# Patient Record
Sex: Female | Born: 1935
Health system: Southern US, Community
[De-identification: ages and names within clinical notes are randomized; demographics above are authoritative.]

## PROBLEM LIST (undated history)

## (undated) DIAGNOSIS — E1165 Type 2 diabetes mellitus with hyperglycemia: Secondary | ICD-10-CM

## (undated) DIAGNOSIS — M353 Polymyalgia rheumatica: Secondary | ICD-10-CM

## (undated) DIAGNOSIS — M159 Polyosteoarthritis, unspecified: Secondary | ICD-10-CM

## (undated) DIAGNOSIS — I10 Essential (primary) hypertension: Secondary | ICD-10-CM

## (undated) DIAGNOSIS — R809 Proteinuria, unspecified: Secondary | ICD-10-CM

## (undated) DIAGNOSIS — F410 Panic disorder [episodic paroxysmal anxiety] without agoraphobia: Secondary | ICD-10-CM

## (undated) DIAGNOSIS — I498 Other specified cardiac arrhythmias: Secondary | ICD-10-CM

## (undated) DIAGNOSIS — E1129 Type 2 diabetes mellitus with other diabetic kidney complication: Secondary | ICD-10-CM

## (undated) DIAGNOSIS — D649 Anemia, unspecified: Secondary | ICD-10-CM

## (undated) DIAGNOSIS — K589 Irritable bowel syndrome without diarrhea: Secondary | ICD-10-CM

## (undated) DIAGNOSIS — E785 Hyperlipidemia, unspecified: Secondary | ICD-10-CM

## (undated) HISTORY — DX: Hyperlipidemia, unspecified: E78.5

## (undated) HISTORY — DX: Type 2 diabetes mellitus with hyperglycemia: E11.65

## (undated) HISTORY — DX: Polyosteoarthritis, unspecified: M15.9

## (undated) HISTORY — DX: Type 2 diabetes mellitus with other diabetic kidney complication: E11.29

## (undated) HISTORY — DX: Essential (primary) hypertension: I10

## (undated) HISTORY — PX: TUBAL LIGATION: SHX77

## (undated) HISTORY — PX: CATARACT EXTRACTION: SUR2

## (undated) HISTORY — DX: Other specified cardiac arrhythmias: I49.8

## (undated) HISTORY — DX: Proteinuria, unspecified: R80.9

## (undated) HISTORY — DX: Polymyalgia rheumatica: M35.3

## (undated) HISTORY — DX: Panic disorder (episodic paroxysmal anxiety): F41.0

## (undated) HISTORY — DX: Irritable bowel syndrome, unspecified: K58.9

---

## 1998-10-11 ENCOUNTER — Emergency Department (HOSPITAL_COMMUNITY): Admission: EM | Admit: 1998-10-11 | Discharge: 1998-10-11 | Payer: Self-pay | Admitting: Emergency Medicine

## 1998-10-12 ENCOUNTER — Encounter: Payer: Self-pay | Admitting: Emergency Medicine

## 1999-07-06 ENCOUNTER — Ambulatory Visit: Admission: RE | Admit: 1999-07-06 | Discharge: 1999-07-06 | Payer: Self-pay | Admitting: Occupational Medicine

## 1999-07-06 ENCOUNTER — Encounter: Payer: Self-pay | Admitting: Occupational Medicine

## 1999-08-14 ENCOUNTER — Other Ambulatory Visit: Admission: RE | Admit: 1999-08-14 | Discharge: 1999-08-14 | Payer: Self-pay | Admitting: Obstetrics & Gynecology

## 1999-09-03 ENCOUNTER — Encounter: Admission: RE | Admit: 1999-09-03 | Discharge: 1999-12-02 | Payer: Self-pay | Admitting: Occupational Medicine

## 1999-12-11 ENCOUNTER — Encounter (INDEPENDENT_AMBULATORY_CARE_PROVIDER_SITE_OTHER): Payer: Self-pay | Admitting: Specialist

## 1999-12-11 ENCOUNTER — Ambulatory Visit (HOSPITAL_COMMUNITY): Admission: RE | Admit: 1999-12-11 | Discharge: 1999-12-11 | Payer: Self-pay | Admitting: Obstetrics and Gynecology

## 2000-07-31 ENCOUNTER — Other Ambulatory Visit: Admission: RE | Admit: 2000-07-31 | Discharge: 2000-07-31 | Payer: Self-pay | Admitting: Obstetrics and Gynecology

## 2000-12-29 ENCOUNTER — Emergency Department (HOSPITAL_COMMUNITY): Admission: EM | Admit: 2000-12-29 | Discharge: 2000-12-30 | Payer: Self-pay | Admitting: Emergency Medicine

## 2000-12-30 ENCOUNTER — Encounter: Payer: Self-pay | Admitting: Emergency Medicine

## 2001-03-06 ENCOUNTER — Encounter: Payer: Self-pay | Admitting: Family Medicine

## 2001-03-06 ENCOUNTER — Encounter: Admission: RE | Admit: 2001-03-06 | Discharge: 2001-03-06 | Payer: Self-pay | Admitting: Family Medicine

## 2001-03-10 ENCOUNTER — Encounter: Admission: RE | Admit: 2001-03-10 | Discharge: 2001-06-08 | Payer: Self-pay | Admitting: Family Medicine

## 2001-11-18 ENCOUNTER — Ambulatory Visit (HOSPITAL_COMMUNITY): Admission: RE | Admit: 2001-11-18 | Discharge: 2001-11-18 | Payer: Self-pay | Admitting: Gastroenterology

## 2003-03-18 ENCOUNTER — Emergency Department (HOSPITAL_COMMUNITY): Admission: EM | Admit: 2003-03-18 | Discharge: 2003-03-18 | Payer: Self-pay | Admitting: Emergency Medicine

## 2005-03-29 ENCOUNTER — Emergency Department (HOSPITAL_COMMUNITY): Admission: EM | Admit: 2005-03-29 | Discharge: 2005-03-29 | Payer: Self-pay | Admitting: Emergency Medicine

## 2005-05-03 ENCOUNTER — Other Ambulatory Visit: Admission: RE | Admit: 2005-05-03 | Discharge: 2005-05-03 | Payer: Self-pay | Admitting: Gynecology

## 2005-06-14 ENCOUNTER — Encounter: Admission: RE | Admit: 2005-06-14 | Discharge: 2005-06-14 | Payer: Self-pay | Admitting: Family Medicine

## 2005-08-11 ENCOUNTER — Emergency Department: Payer: Self-pay | Admitting: Emergency Medicine

## 2007-05-18 ENCOUNTER — Encounter: Admission: RE | Admit: 2007-05-18 | Discharge: 2007-05-18 | Payer: Self-pay | Admitting: Family Medicine

## 2007-08-26 ENCOUNTER — Other Ambulatory Visit: Admission: RE | Admit: 2007-08-26 | Discharge: 2007-08-26 | Payer: Self-pay | Admitting: Gynecology

## 2008-03-10 ENCOUNTER — Ambulatory Visit: Payer: Self-pay | Admitting: Ophthalmology

## 2008-03-10 ENCOUNTER — Other Ambulatory Visit: Payer: Self-pay

## 2008-03-28 ENCOUNTER — Ambulatory Visit: Payer: Self-pay | Admitting: Ophthalmology

## 2010-02-04 ENCOUNTER — Emergency Department: Payer: Self-pay | Admitting: Emergency Medicine

## 2010-05-29 DIAGNOSIS — R809 Proteinuria, unspecified: Secondary | ICD-10-CM

## 2010-05-29 DIAGNOSIS — E1165 Type 2 diabetes mellitus with hyperglycemia: Secondary | ICD-10-CM

## 2010-05-29 DIAGNOSIS — E1129 Type 2 diabetes mellitus with other diabetic kidney complication: Secondary | ICD-10-CM

## 2010-05-29 HISTORY — DX: Type 2 diabetes mellitus with other diabetic kidney complication: E11.29

## 2010-05-29 HISTORY — DX: Type 2 diabetes mellitus with hyperglycemia: E11.65

## 2010-05-29 HISTORY — DX: Proteinuria, unspecified: R80.9

## 2011-01-11 NOTE — Op Note (Signed)
[facility]  Patient:    Kristen Bowman, Kristen Bowman                        MRN: [id number] Proc. Date: 12/11/99 Adm. Date:  [id number] Attending:  Amanda Cockayne                           Operative Report  PREOPERATIVE DIAGNOSIS:       Postmenopausal bleeding.  POSTOPERATIVE DIAGNOSIS:      Postmenopausal bleeding.  Plus endometrial fibroid. Plus atrophic endometrium.  Plus vaginal polyp which has grown.  OPERATION:                    Resection of endometrial fibroid and removal of vaginal polyp.  SURGEON:                      Esmeralda Arthur, M.D.  ASSISTANT:  ANESTHESIA:                   General anesthesia.  PACKS:                        None.  MEDIUM:                       Sorbitol.  Deficit 100 cc.  ESTIMATED BLOOD LOSS:  DESCRIPTION OF PROCEDURE:     The patient was carried to the operating room and  after satisfactory anesthesia, she was placed in the lithotomy position, prepped and draped and the bladder was catheterized.  A weighted speculum was placed in the posterior vagina.  The cervix was grasped  with a tenaculum and the uterus easily sounded to 7 cm.  We then dilated easy to a #21 and then dilated to a #25.  The observation scope was inserted and her right tubal opening was visualized and on the left, she had some blood which I am not  sure we caused or if it is from old bleeding.  Otherwise appeared very atrophic and her posterior floor, she had I think a fibroid.  I was unable to grasp this like a polyp.  We then dilated her to #35 and inserted a resectoscope and resectoscoped the posterior floor.  I sent that as a separate specimen.  I then cauterized the area.  Bleeding was decreased.  We then watched her bleeding for three minutes.  She had no excess bleeding.  I  removed the vaginal polyp which was about 1.5 cm x 1.5 cm.  We then cauterized his with silver nitrate sticks.  We removed the tenaculum on  the cervix.  She had a gush of blood.  We applied a  spongestick to this and it stopped after about 1.5 minutes.  The patient tolerated the procedure well and was carried to the recovery room in good condition. DD:  12/11/99 TD:  12/11/99 Job: [id number]

## 2011-03-25 ENCOUNTER — Encounter: Payer: Self-pay | Admitting: *Deleted

## 2011-03-25 ENCOUNTER — Encounter: Payer: Medicare Other | Attending: Family Medicine | Admitting: *Deleted

## 2011-03-25 DIAGNOSIS — E119 Type 2 diabetes mellitus without complications: Secondary | ICD-10-CM | POA: Insufficient documentation

## 2011-03-25 DIAGNOSIS — Z713 Dietary counseling and surveillance: Secondary | ICD-10-CM | POA: Insufficient documentation

## 2011-03-25 NOTE — Progress Notes (Signed)
  Medical Nutrition Therapy:  Appt start time: 1200 end time:  1300.  Assessment:  Primary concerns today: T2DM w/ Renal Manifestations. Pt here with uncontrolled T2DM and A1c of 8.0% (down from 8.8%).  Reports dietary intake of fried foods, excessive CHO, and increased sodium/fat.  No exercise noted, though pt states she can do some. FBG since 03/11/11 include some hypoglycemia (73, 58, 56, 48, 75 mg/dL), but no sx noted. FBG on 03/25/11 was 146 mg/dL. Reports intake of sweets daily and increased meals away from home.  Pt has renal manifestations, though renal diet not ordered by MD.   MEDICATIONS: Benazepril-HCTZ, Clonazepam, Vytorin, Levemir, Metoprolol, Metformin  DIETARY INTAKE 24-hr recall:  B ( AM): 1/2 banana on "wheat" cereal or cheerios; sometimes oatmeal  Snk ( AM): none or yogurt L ( PM): Corn on cobb, green beans, macaroni & cheese; unsweetened tea Snk ( PM): oatmeal creme cookie or 1/2 candy bar D ( PM): Fried fish, 1/2 baked potato, hush puppies (4 lg); unsweetened tea Snk ( PM): none  Usual physical activity: No exercise noted  Estimated energy needs: 1200 calories 135 g carbohydrates 60 g protein 40-45 g fat  Progress Towards Goal(s):  NEW.   Nutritional Diagnosis: [place]-2.1 Inpaired nutrition utilization related to glucose as evidenced by a recent A1c of 8.0%, eAG of 183 mg/dL, and MD referral for DM education.    Intervention/Goals:  Follow Diabetes Meal Plan as instructed (see yellow card).  Eat 3 meals and 2 snacks, or every 3-5 hrs; Avoid meal skipping.  Limit carbohydrate intake to 30 grams carbohydrate per meal.  Limit carbohydrate intake to 15 grams carbohydrate per snack.  Add lean protein foods to meals and snacks; Limit fried and high carb foods.  Aim for 20-25 g fiber daily.  Monitor glucose levels as instructed by your doctor.  Aim for 20-30 mins of physical activity daily - try walking and/or swimming.  Bring food record and glucose log to your  next nutrition visit.  Monitoring/Evaluation:  Dietary intake, exercise, A1c, and body weight in 4 week(s).

## 2011-03-25 NOTE — Patient Instructions (Addendum)
Goals:  Follow Diabetes Meal Plan as instructed (see yellow card).  Eat 3 meals and 2 snacks, or every 3-5 hrs; Avoid meal skipping.  Limit carbohydrate intake to 30 grams carbohydrate per meal.  Limit carbohydrate intake to 15 grams carbohydrate per snack.  Add lean protein foods to meals and snacks; Limit fried and high carb foods.  Aim for 20-25 g fiber daily.  Monitor glucose levels as instructed by your doctor.  Aim for 20-30 mins of physical activity daily - try walking and/or swimming.  Bring food record and glucose log to your next nutrition visit.

## 2011-03-26 ENCOUNTER — Encounter: Payer: Self-pay | Admitting: *Deleted

## 2011-03-27 ENCOUNTER — Encounter: Payer: Self-pay | Admitting: *Deleted

## 2011-04-23 ENCOUNTER — Ambulatory Visit: Payer: Medicare Other | Admitting: *Deleted

## 2011-04-30 ENCOUNTER — Encounter: Payer: Self-pay | Admitting: *Deleted

## 2011-04-30 ENCOUNTER — Encounter: Payer: Medicare Other | Attending: Family Medicine | Admitting: *Deleted

## 2011-04-30 DIAGNOSIS — E119 Type 2 diabetes mellitus without complications: Secondary | ICD-10-CM | POA: Insufficient documentation

## 2011-04-30 DIAGNOSIS — Z713 Dietary counseling and surveillance: Secondary | ICD-10-CM | POA: Insufficient documentation

## 2011-04-30 NOTE — Progress Notes (Signed)
  Medical Nutrition Therapy:  Appt start time: 1200 end time:  1300.  Assessment:  Primary concerns today: T2DM w/ Renal Manifestations. **NOTE: Wt from last encounter (03/25/11) incorrect - Correct wt was 140 lbs! Not used to eating this much; decreased intake and meal skipping reported since she and boyfriend of 13 years split up last month. Wt loss of 7 lbs since last visit.  Also suspects she is mildly lactose intolerant.  States she is eating sweets daily, but has cut down portions, and eats most meals out at [employer] or [employer]. Has not been counting CHO d/t lack of understanding. Will continue to work on this. FBG ranging from 106-172 mg/dL over last week reported.  MEDICATIONS: No Changes  DIETARY INTAKE:  Has sweets daily after dinner (i.e. Ice cream donuts, candy, 1/2 pc of pie from [employer], etc); eats most meals out.   Usual physical activity:  No exercise noted  Estimated energy needs: 1200 calories 135 g carbohydrates 60 g protein 40-45 g fat  Progress Towards Goal(s):  Minimal progress.   Nutritional Diagnosis: [place]-2.1 Impaired nutrient utilization related to excessive refined CHO intake as evidenced by a recent A1c of 8.0%, eAG of 183 mg/dL, and MD referral for DM education.    Intervention/Goals:  (Continue Previous Goals)  Follow Diabetes Meal Plan as instructed (see yellow card).  Limit meals away from home.  Limit sweets daily.  Aim for 20-30 mins of physical activity daily - Call insurance company to see about locations that offer Silver Sneakers program.  Bring food record and glucose log to your next nutrition visit.  Try looking at www.diabetes.org for recipes and www.calorieking.com for nutrition information for foods.   Contact MD regarding re-check of A1c.   Monitoring/Evaluation:  Dietary intake, exercise, A1c, and body weight in 4-6 week(s).

## 2011-04-30 NOTE — Patient Instructions (Addendum)
Goals:  (Continue Previous Goals)  Follow Diabetes Meal Plan as instructed (see yellow card).  Limit meals away from home.  Limit sweets daily.  Aim for 20-30 mins of physical activity daily - Call insurance company to see about locations that offer Silver Sneakers program.  Bring food record and glucose log to your next nutrition visit.  Try looking at www.diabetes.org for recipes and www.calorieking.com for nutrition information for foods.   Contact MD regarding re-check of A1c.

## 2011-05-23 ENCOUNTER — Encounter: Payer: Medicare Other | Admitting: *Deleted

## 2011-05-23 ENCOUNTER — Encounter: Payer: Self-pay | Admitting: *Deleted

## 2011-05-23 NOTE — Patient Instructions (Addendum)
Goals:  Continue previous nutrition goals (follow yellow card).  Limit meals out.  Choose sodium free options (ex: Her[employer]ox chicken broth).  Aim to start exercise program as soon as possible.

## 2011-05-23 NOTE — Progress Notes (Signed)
  Medical Nutrition Therapy:  Appt start time: 1200 end time:  1300.  Assessment:  Primary concerns today: T2DM w/ Renal Manifestations. Pt states she may look for endocrinologist since the [facility] is so strong and sister died of DM.  Still skipping meals and states lack of appetite sometimes.  Continues to monitor FBG and reports a low of 58 mg/dl this morning; last few weeks ranged from 68-130/150s.  Reports a "funny feeling" in her heart that makes her feel weak.  MD following per pt. Checks feet often.  MEDICATIONS:  No changes  DIETARY INTAKE:  Averages 2 meals/day and 1-2 snacks. Still eats most meals out, but trying to cook more.  - Homemade dumplings  - Decreased sweets; eats crackers instead.  Usual physical activity:  No exercise noted.  Estimated energy needs: 1200 calories 135 g carbohydrates 60 g protein 40-45 g fat  Progress Towards Goal(s):  Some progress; continue.   Nutritional Diagnosis: -2.1 Impaired nutrient utilization related to excessive refined CHO intake as evidenced by a recent A1c of 8.0%, eAG of 183 mg/dL, and MD referral for DM education.    Intervention/Goals:  (Continue Previous Goals)  Continue previous nutrition goals (follow yellow card).  Limit meals out.  Choose sodium free options (ex: Her[employer]ox chicken broth).  Aim to start exercise program as soon as possible.   Monitoring/Evaluation:  Dietary intake, exercise, A1c, BG trends, and body weight in 8 week(s).

## 2011-06-26 ENCOUNTER — Emergency Department (HOSPITAL_COMMUNITY): Payer: Medicare Other

## 2011-06-26 ENCOUNTER — Observation Stay (HOSPITAL_COMMUNITY)
Admission: EM | Admit: 2011-06-26 | Discharge: 2011-06-27 | Disposition: A | Discharge status: Home / Self Care | Payer: Medicare Other | Attending: Internal Medicine | Admitting: Internal Medicine

## 2011-06-26 DIAGNOSIS — F410 Panic disorder [episodic paroxysmal anxiety] without agoraphobia: Secondary | ICD-10-CM | POA: Insufficient documentation

## 2011-06-26 DIAGNOSIS — Y92009 Unspecified place in unspecified non-institutional (private) residence as the place of occurrence of the external cause: Secondary | ICD-10-CM | POA: Insufficient documentation

## 2011-06-26 DIAGNOSIS — E119 Type 2 diabetes mellitus without complications: Secondary | ICD-10-CM | POA: Insufficient documentation

## 2011-06-26 DIAGNOSIS — E785 Hyperlipidemia, unspecified: Secondary | ICD-10-CM | POA: Insufficient documentation

## 2011-06-26 DIAGNOSIS — X58XXXA Exposure to other specified factors, initial encounter: Secondary | ICD-10-CM | POA: Insufficient documentation

## 2011-06-26 DIAGNOSIS — T783XXA Angioneurotic edema, initial encounter: Principal | ICD-10-CM | POA: Insufficient documentation

## 2011-06-26 DIAGNOSIS — R0602 Shortness of breath: Secondary | ICD-10-CM | POA: Insufficient documentation

## 2011-06-26 DIAGNOSIS — I10 Essential (primary) hypertension: Secondary | ICD-10-CM | POA: Insufficient documentation

## 2011-06-26 LAB — CK TOTAL AND CKMB (NOT AT ARMC): Relative Index: INVALID (ref 0.0–2.5)

## 2011-06-26 LAB — CBC
HCT: 32.3 % — ABNORMAL LOW (ref 36.0–46.0)
Hemoglobin: 10.9 g/dL — ABNORMAL LOW (ref 12.0–15.0)
MCV: 85.7 fL (ref 78.0–100.0)
RBC: 3.77 MIL/uL — ABNORMAL LOW (ref 3.87–5.11)
RDW: 15.1 % (ref 11.5–15.5)
WBC: 5.5 10*3/uL (ref 4.0–10.5)

## 2011-06-26 LAB — COMPREHENSIVE METABOLIC PANEL
ALT: 11 U/L (ref 0–35)
AST: 19 U/L (ref 0–37)
Albumin: 3.7 g/dL (ref 3.5–5.2)
Alkaline Phosphatase: 83 U/L (ref 39–117)
Chloride: 100 mEq/L (ref 96–112)
Potassium: 3.2 mEq/L — ABNORMAL LOW (ref 3.5–5.1)
Sodium: 141 mEq/L (ref 135–145)
Total Bilirubin: 0.5 mg/dL (ref 0.3–1.2)

## 2011-06-26 LAB — GLUCOSE, CAPILLARY: Glucose-Capillary: 421 mg/dL — ABNORMAL HIGH (ref 70–99)

## 2011-06-26 LAB — POCT I-STAT TROPONIN I: Troponin i, poc: 0 ng/mL (ref 0.00–0.08)

## 2011-06-26 LAB — DIFFERENTIAL
Eosinophils Relative: 4 % (ref 0–5)
Lymphocytes Relative: 35 % (ref 12–46)
Lymphs Abs: 1.9 10*3/uL (ref 0.7–4.0)

## 2011-06-26 LAB — PRO B NATRIURETIC PEPTIDE: Pro B Natriuretic peptide (BNP): 109.2 pg/mL (ref 0–450)

## 2011-06-26 NOTE — Consult Note (Signed)
NAME:  Kristen Bowman, Kristen Bowman                 ACCOUNT NO.:  [id number]  MEDICAL RECORD NO.:  [id number]  LOCATION:  [facility]                         FACILITY:  [facility]  PHYSICIAN:  Mattias Walmsley H. Pollyann Kennedy, MD     DATE OF BIRTH:  07-29-1931  DATE OF CONSULTATION:  06/26/2011 DATE OF DISCHARGE:                                CONSULTATION   REASON FOR CONSULTATION:  Sore throat and difficulty breathing.  HISTORY:  This is a 77 year old lady, previously in good health.  About 2 days ago, she started developing some laryngitis.  She had no pain or trouble swallowing.  She had no fever.  It progressed until early this morning when she woke up and felt that she was unable to breathe normally.  She had to sit up and was able to breathe a little better. She is still having no trouble swallowing.  She started having some pain in her throat.  She still had no fever.  She was seen in the emergency department and had a lateral x-ray which revealed some possible prevertebral thickening but no evidence of epiglottitis.  She has no fever, stable vital signs and her white blood cell count is within normal limits.  PAST MEDICAL HISTORY:  Significant for diabetes, hypertension, and high cholesterol.  She is on benazepril combination with hydrochlorothiazide and has been on that for several years.  She has been on statins for several years also but about 2 weeks ago had her statin changed to a new one.  She takes 1 shot of insulin a day and is on metformin otherwise.  PAST SURGICAL HISTORY:  Unremarkable.  PHYSICAL EXAMINATION:  She is a healthy-appearing elderly lady.  She is in no distress.  She does have a severe worse and strained voice quality with a very mild biphasic stridor when she breathes forcefully.  When at rest she is breathing quietly.  Oral cavity and pharynx free of any mucosal lesions.  She is edentulous, has upper and lower dentures. Nasal exam clear.  Slight rightward septal deviation.  External  ears normal.  No palpable neck masses.  Flexible fiberoptic laryngoscopy was performed using topical viscous lidocaine for local anesthetic.  There is significant pale boggy edema of the supraglottic mucosa involving the arytenoids, false cords, and a little bit onto the aryepiglottic folds and lateral aspects of the epiglottis.  The cords are moving but slightly restricted from the swelling.  Subglottic airway is not visualized.  No mucosal lesions identified.  The larynx is symmetric.  IMPRESSION:  Angioedema.  This is likely secondary to benazepril, although since she has recently changed her statin, there is also possibility that could be the cause.  I would recommend admission to the hospital by the medical service to adjust her medications appropriately. I would stop the statin and the benazepril immediately.  She has not had any since yesterday in any way.  I would monitor her blood pressure and her blood sugars after having changed her medicines.  I will continue to watch for as far as her airway is concerned and will intervene as needed.  I will recommend intravenous steroids, antihistamines, and H2 blockers for now.  I will  continue to follow.  We will checkup on her again every couple of hours to make sure she does not progress or get any worse.     Jelene Albano H. Pollyann Kennedy, MD     JHR/MEDQ  D:  06/26/2011  T:  06/26/2011  Job:  [id number]  Electronically Signed by Serena Colonel MD on 06/26/2011 05:00:04 PM

## 2011-06-27 LAB — GLUCOSE, CAPILLARY
Glucose-Capillary: 238 mg/dL — ABNORMAL HIGH (ref 70–99)
Glucose-Capillary: 132 mg/dL — ABNORMAL HIGH (ref 70–99)

## 2011-06-28 NOTE — H&P (Signed)
Kristen Bowman, Kristen Bowman                 ACCOUNT NO.:  [id number]  MEDICAL RECORD NO.:  [id number]  LOCATION:  5524                         FACILITY:  [facility]  PHYSICIAN:  Kristen Bowman, M.D.       DATE OF BIRTH:  02-03-1931  DATE OF ADMISSION:  06/26/2011 DATE OF DISCHARGE:                             HISTORY & PHYSICAL   PRIMARY CARE PHYSICIAN:  [name], MD  CHIEF COMPLAINT:  Difficulty breathing.  HISTORY OF PRESENT ILLNESS:  This is a very pleasant 77 year old female, who lives with her son.  She has history of anxiety and panic attacks as well as type 2 diabetes, hypertension, and hyperlipidemia.  She felt as though she had mild laryngitis for 2 days.  No sore throat pain or symptoms, but had difficulty speaking.  This morning, she awoke early with severe difficulty breathing and felt as though she is going to die. The patient is on ACE inhibitor and has recently started on another medication, which I believe is a statin, but she cannot remember the name of it.  Her son brought her to the emergency department.  The ED physician consulted, Dr. Serena Colonel, who is an ENT.  Dr. Pollyann Kennedy examined her with flexible fiberoptic laryngoscopy and found significant pale- boggy edema of the supraglottic mucosa involving the arytenoids false cords and a little bit of the aryepiglottic folds in lateral aspect of the epiglottis.  Cords are moving, but are slightly restricted from the swelling.  He diagnosed her with angioedema likely secondary to benazepril.  The triad hospitalists were called for admission primarily for observation.  PAST MEDICAL HISTORY:  Significant for type 2 diabetes, hypertension, and hyperlipidemia.  She is status post endometrial resection.  She also has history of panic attacks.  CURRENT MEDICATIONS: 1. Klonopin 0.5 mg 1 tablet twice daily. 2. Levemir 12 units daily at bedtime. 3. Metformin 1000 mg p.o. 1 tablet b.i.d. 4. Metoprolol-XL succinate 100 mg p.o. 1.5  tablets daily. 5. Benazepril/hydrochlorothiazide 10/12.5 mg 1 tablet daily. 6. Atorvastatin 40 mg 1 tablet daily. 7. Ezetimibe/simvastatin 10/40 mg 1 tablet p.o. daily.  ALLERGIES:  She previously had no known allergies.  REVIEW OF SYSTEMS:  Essentially negative.  She describes no headache. No fever.  No abdominal pain.  No vomiting.  She has been eating well, feeling in her usual good health recently.  SOCIAL HISTORY:  Negative for tobacco.  Positive for rare alcohol.  She has 1 or 2 strawberry daiquiris a year.  She lives in with her son and is independent with her ADLs.  FAMILY HISTORY:  Significant for father who died with leukemia, her mother who died with liver cancer, and her sister who died as a type 1 diabetic.  PHYSICAL EXAMINATION:  GENERAL:  The patient is alert and oriented, pleasant, speak with her voice does sound slightly worse. VITAL SIGNS:  Temperature 98.0, pulse 94, respirations 18, Bowman pressure 129/55, O2 sat is 94% on 2-1/2 L. HEART:  Regular rate and rhythm.  No obvious murmurs, rubs, or gallops. LUNGS:  Sound clear to auscultation.  No wheezes.  No crackles.  No accessory muscle use. ABDOMEN:  Soft, nontender, and nondistended, good bowel sounds.  EXTREMITIES:  Show no clubbing, cyanosis, or edema. SKIN:  Has no rash, no lesions. NEURO:  Cranial nerves II through XII are grossly intact.  She has no facial asymmetries, no obvious lacunar deficit.  LABORATORY DATA:  Her hemoglobin is 10.9, hematocrit 32.3, white count 5.5, platelets 179.  Sodium 141, potassium 3.2, chloride 100, bicarb 27, BUN 15, creatinine 1.02.  LFTs are within normal limits.  BNP is 109.2. Her group A strep is negative.  Radiological exams included chest x-ray that showed emphysematous changes.  Also, she has an x-ray of her neck that shows prevertebral soft tissue swelling causing some narrowing of the cervical tracheal air column.  ASSESSMENT/PLAN: 1. Difficulty breathing, likely  secondary to angioedema. 2. Angioedema, likely secondary to ACE inhibitor. 3. Type 2 diabetes.  The patient will be placed on NovoLog and Lantus. 4. Hypertension.  We will give her metoprolol with old parameters;     however, we will hold her ACE inhibitor. 5. Hyperlipidemia.  We will hold her statins for now. 6. Panic attacks.  She will be maintained on her p.o. Klonopin. 7. Mild hypokalemia.  We will replete in her IV fluids.  We would     suggest to monitor this patient closely for period of 12 to 72     hours until the resolution of her angioedema.     Fermin Schwab, MD   ______________________________ Kristen Bowman, M.D.    TY/MEDQ  D:  06/26/2011  T:  06/26/2011  Job:  [id number]  cc:   [name], M.D.  Electronically Signed by Kristen Bowman M.D. on 06/28/2011 04:53:11 PM

## 2012-09-24 ENCOUNTER — Inpatient Hospital Stay (HOSPITAL_COMMUNITY)
Admission: EM | Admit: 2012-09-24 | Discharge: 2012-09-26 | DRG: 310 | Disposition: A | Discharge status: Home / Self Care | Payer: Medicare Other | Attending: Internal Medicine | Admitting: Internal Medicine

## 2012-09-24 ENCOUNTER — Emergency Department (HOSPITAL_COMMUNITY): Payer: Medicare Other

## 2012-09-24 ENCOUNTER — Encounter (HOSPITAL_COMMUNITY): Payer: Self-pay | Admitting: Internal Medicine

## 2012-09-24 DIAGNOSIS — R55 Syncope and collapse: Secondary | ICD-10-CM

## 2012-09-24 DIAGNOSIS — Z794 Long term (current) use of insulin: Secondary | ICD-10-CM

## 2012-09-24 DIAGNOSIS — K589 Irritable bowel syndrome without diarrhea: Secondary | ICD-10-CM | POA: Diagnosis present

## 2012-09-24 DIAGNOSIS — N182 Chronic kidney disease, stage 2 (mild): Secondary | ICD-10-CM

## 2012-09-24 DIAGNOSIS — D649 Anemia, unspecified: Secondary | ICD-10-CM

## 2012-09-24 DIAGNOSIS — I498 Other specified cardiac arrhythmias: Principal | ICD-10-CM | POA: Diagnosis present

## 2012-09-24 DIAGNOSIS — Z833 Family history of diabetes mellitus: Secondary | ICD-10-CM

## 2012-09-24 DIAGNOSIS — F410 Panic disorder [episodic paroxysmal anxiety] without agoraphobia: Secondary | ICD-10-CM

## 2012-09-24 DIAGNOSIS — D509 Iron deficiency anemia, unspecified: Secondary | ICD-10-CM

## 2012-09-24 DIAGNOSIS — Z79899 Other long term (current) drug therapy: Secondary | ICD-10-CM

## 2012-09-24 DIAGNOSIS — E785 Hyperlipidemia, unspecified: Secondary | ICD-10-CM

## 2012-09-24 DIAGNOSIS — R002 Palpitations: Secondary | ICD-10-CM

## 2012-09-24 DIAGNOSIS — M353 Polymyalgia rheumatica: Secondary | ICD-10-CM | POA: Diagnosis present

## 2012-09-24 DIAGNOSIS — E1129 Type 2 diabetes mellitus with other diabetic kidney complication: Secondary | ICD-10-CM

## 2012-09-24 DIAGNOSIS — E1165 Type 2 diabetes mellitus with hyperglycemia: Secondary | ICD-10-CM | POA: Diagnosis present

## 2012-09-24 DIAGNOSIS — I129 Hypertensive chronic kidney disease with stage 1 through stage 4 chronic kidney disease, or unspecified chronic kidney disease: Secondary | ICD-10-CM | POA: Diagnosis present

## 2012-09-24 DIAGNOSIS — I10 Essential (primary) hypertension: Secondary | ICD-10-CM

## 2012-09-24 DIAGNOSIS — R000 Tachycardia, unspecified: Secondary | ICD-10-CM

## 2012-09-24 LAB — POCT I-STAT 3, VENOUS BLOOD GAS (G3P V)
Bicarbonate: 24.8 mEq/L — ABNORMAL HIGH (ref 20.0–24.0)
O2 Saturation: 70 %
TCO2: 26 mmol/L (ref 0–100)

## 2012-09-24 LAB — COMPREHENSIVE METABOLIC PANEL
BUN: 22 mg/dL (ref 6–23)
Calcium: 9.7 mg/dL (ref 8.4–10.5)
Creatinine, Ser: 1.03 mg/dL (ref 0.50–1.10)
GFR calc Af Amer: 58 mL/min — ABNORMAL LOW (ref >90)
GFR calc non Af Amer: 50 mL/min — ABNORMAL LOW (ref >90)
Glucose, Bld: 294 mg/dL — ABNORMAL HIGH (ref 70–99)
Sodium: 141 mEq/L (ref 135–145)
Total Protein: 7.4 g/dL (ref 6.0–8.3)

## 2012-09-24 LAB — CBC WITH DIFFERENTIAL/PLATELET
Eosinophils Absolute: 0.1 10*3/uL (ref 0.0–0.7)
Eosinophils Relative: 2 % (ref 0–5)
Hemoglobin: 11.3 g/dL — ABNORMAL LOW (ref 12.0–15.0)
Lymphs Abs: 2.5 10*3/uL (ref 0.7–4.0)
MCHC: 32.4 g/dL (ref 30.0–36.0)
Monocytes Absolute: 0.3 10*3/uL (ref 0.1–1.0)
Monocytes Relative: 6 % (ref 3–12)
RDW: 15.5 % (ref 11.5–15.5)
WBC: 4.8 10*3/uL (ref 4.0–10.5)

## 2012-09-24 LAB — URINALYSIS, ROUTINE W REFLEX MICROSCOPIC
Nitrite: NEGATIVE
Specific Gravity, Urine: 1.009 (ref 1.005–1.030)
Urobilinogen, UA: 0.2 mg/dL (ref 0.0–1.0)
pH: 5.5 (ref 5.0–8.0)

## 2012-09-24 LAB — KETONES, QUALITATIVE: Acetone, Bld: NEGATIVE

## 2012-09-24 LAB — GLUCOSE, CAPILLARY: Glucose-Capillary: 274 mg/dL — ABNORMAL HIGH (ref 70–99)

## 2012-09-24 LAB — TROPONIN I: Troponin I: <0.3 ng/mL (ref <0.30)

## 2012-09-24 LAB — URINE MICROSCOPIC-ADD ON

## 2012-09-24 MED ORDER — DILTIAZEM HCL 50 MG/10ML IV SOLN
10.0000 mg | Freq: Once | INTRAVENOUS | Status: DC
  Filled 2012-09-24: qty 2

## 2012-09-24 MED ORDER — SODIUM CHLORIDE 0.9 % IV BOLUS (SEPSIS)
1000.0000 mL | Freq: Once | INTRAVENOUS | Status: AC
  Administered 2012-09-24: 1000 mL via INTRAVENOUS

## 2012-09-24 MED ORDER — SODIUM CHLORIDE 0.9 % IV BOLUS (SEPSIS): 1000.0000 mL | Freq: Once | INTRAVENOUS | Status: DC

## 2012-09-24 MED ORDER — SODIUM CHLORIDE 0.9 % IV SOLN
Freq: Once | INTRAVENOUS | Status: AC
  Administered 2012-09-24: 100 mL via INTRAVENOUS

## 2012-09-24 MED ORDER — DILTIAZEM HCL 100 MG IV SOLR
5.0000 mg/h | INTRAVENOUS | Status: DC
  Administered 2012-09-24: 5 mg/h via INTRAVENOUS
  Filled 2012-09-24: qty 100

## 2012-09-24 NOTE — ED Provider Notes (Signed)
History  This chart was scribed for Glynn Octave, MD by Shari Heritage, ED Scribe. The patient was seen in room TRAAC/TRAAC. Patient's care was started at 1918.   CSN: [id number]  Arrival date & time 09/24/12  1918   Chief Complaint  Patient presents with  . Hyperglycemia  . Tachycardia  . Hypertension    The history is provided by the patient. No language interpreter was used.    HPI Comments: Kristen Bowman is a 79 y.o. female who presents to the Emergency Department stating 1.5 hours ago, she started to have palpitations and became flushed in the face. Patient says that while she was taking her blood sugar at home, her heart started to beat very fast. Patient claims that her heart rate gradually increased for 1 hour prior to the sudden increase. Patient's blood sugar was 428. Patient denies any chest pain, shortness of breath, fever, cough, abdominal pain or vomiting. Patient states that she has experienced similar symptoms before a few years ago. She was brought to the ED by her neighbor. Patient does not have a cardiologist. Medical history includes hypertension, diabetes, hyperlipidemia and mixed cardia dysrhythmias. Patient denies history of kidney problems.  PCP - Cam Hai   Past Medical History  Diagnosis Date  . Hypertension   . Diabetes mellitus     T2DM with renal manifestations  . Panic disorder   . Polymyalgia rheumatica   . Hematuria   . Type II or unspecified type diabetes mellitus with renal manifestations, uncontrolled(250.42) 05/29/10    CKD II (mild)  . Proteinuria 05/29/10  . Generalized osteoarthrosis, unspecified site   . Other specified cardiac dysrhythmias   . Hyperlipidemia     mixed  . Irritable bowel syndrome   . Anxiety states     History reviewed. No pertinent past surgical history.  Family History  Problem Relation Age of Onset  . Diabetes Sister   . Cancer Other   . Heart attack Other     History  Substance Use Topics  . Smoking  status: Never Smoker   . Smokeless tobacco: Never Used  . Alcohol Use: No    OB History    Grav Para Term Preterm Abortions TAB SAB Ect Mult Living                  Review of Systems A complete 10 system review of systems was obtained and all systems are negative except as noted in the HPI and PMH.   Allergies  Review of patient's allergies indicates no known allergies.  Home Medications   Current Outpatient Rx  Name  Route  Sig  Dispense  Refill  . AMLODIPINE BESYLATE 10 MG PO TABS   Oral   Take 10 mg by mouth daily.         Marland Kitchen CLONAZEPAM 0.5 MG PO TABS   Oral   Take 0.5 mg by mouth 2 (two) times daily. Takes 1/2 tab in a.m. and 1 tab at hs         . INSULIN ASPART PROT & ASPART (70-30) 100 UNIT/ML [place] SUSP   Subcutaneous   Inject 12-14 Units into the skin 2 (two) times daily with a meal. 12 units in a.m. And 14 units in pm         . METFORMIN HCL 1000 MG PO TABS   Oral   Take 1,000 mg by mouth 2 (two) times daily with a meal.           .  METOPROLOL SUCCINATE ER 100 MG PO TB24   Oral   Take 150 mg by mouth daily.          Marland Kitchen SIMVASTATIN 20 MG PO TABS   Oral   Take 20 mg by mouth every evening.           Triage Vitals: BP 185/79  Pulse 144  Temp 98.4 F (36.9 C) (Oral)  Resp 14  SpO2 98%  Physical Exam  Constitutional: She is oriented to person, place, and time. She appears well-developed and well-nourished.       Elderly female sitting up in bed. No acute distress. Speaking in full sentences.  HENT:  Head: Normocephalic and atraumatic.  Eyes: Conjunctivae normal and EOM are normal. Pupils are equal, round, and reactive to light.  Neck: Neck supple.  Cardiovascular: Regular rhythm, normal heart sounds and intact distal pulses.  Tachycardia present.   No murmur heard. Pulmonary/Chest: Effort normal and breath sounds normal. No respiratory distress. She has no wheezes. She has no rales.  Abdominal: Soft. There is no tenderness.  Neurological:  She is alert and oriented to person, place, and time.  Skin: Skin is warm.  Psychiatric: She has a normal mood and affect. Her behavior is normal.    ED Course  Procedures (including critical care time) DIAGNOSTIC STUDIES: Oxygen Saturation is 98% on room air, normal by my interpretation.    COORDINATION OF CARE: 7:54 PM- Patient informed of current plan for treatment and evaluation and agrees with plan at this time.   8:08 PM - Patient moved to D36.  8:26 PM- Heart rate is sinus. Patient states she feels that HR is slowing.     Labs Reviewed  CBC WITH DIFFERENTIAL - Abnormal; Notable for the following:    Hemoglobin 11.3 (*)     HCT 34.9 (*)     Neutrophils Relative 39 (*)     Lymphocytes Relative 52 (*)     All other components within normal limits  COMPREHENSIVE METABOLIC PANEL - Abnormal; Notable for the following:    Glucose, Bld 294 (*)     GFR calc non Af Amer 50 (*)     GFR calc Af Amer 58 (*)     All other components within normal limits  URINALYSIS, ROUTINE W REFLEX MICROSCOPIC - Abnormal; Notable for the following:    APPearance CLOUDY (*)     Glucose, UA 250 (*)     Leukocytes, UA TRACE (*)     All other components within normal limits  GLUCOSE, CAPILLARY - Abnormal; Notable for the following:    Glucose-Capillary 274 (*)     All other components within normal limits  POCT I-STAT 3, BLOOD GAS (G3P V) - Abnormal; Notable for the following:    pH, Ven 7.371 (*)     pCO2, Ven 42.7 (*)     Bicarbonate 24.8 (*)     All other components within normal limits  URINE MICROSCOPIC-ADD ON - Abnormal; Notable for the following:    Squamous Epithelial / LPF FEW (*)     All other components within normal limits  TROPONIN I  KETONES, QUALITATIVE  TROPONIN I  TROPONIN I  TSH  T4, FREE   Dg Chest Portable 1 View  09/24/2012  *RADIOLOGY REPORT*  Clinical Data: Hyperglycemia.  Tachycardia.  Diabetic hypertensive patient.  PORTABLE CHEST - 1 VIEW  Comparison:  06/26/2011.  Findings: Calcified slightly tortuous aorta.  Heart size within normal limits.  No infiltrate, congestive heart failure  or pneumothorax.  The patient would eventually benefit from follow-up two-view chest with cardiac leads removed.  IMPRESSION: Calcified mildly tortuous aorta.  No infiltrate or congestive heart failure.  Please see above.   Original Report Authenticated By: Lacy Duverney, M.D.      1. Tachycardia   2. Palpitations   3. Near syncope   4. Anemia   5. CKD (chronic kidney disease), stage II   6. Type II or unspecified type diabetes mellitus with renal manifestations, uncontrolled(250.42)      Date: 09/24/2012  Rate: 135  Rhythm: concern for possible A. flutter  QRS Axis: normal  Intervals: normal  ST/T Wave abnormalities: normal  Conduction Disutrbances:none  Narrative Interpretation:  p waves present. Doubt SVT. p waves with same morphology and regular rate. Doubt a. Fib. Possible a. Flutter.  Old EKG Reviewed: changes noted    MDM   79 y/o F p/w tachycardia. Pre-syncopal sensation. Uncertain source. A. Flutter versus sinus tachycardia. Started on dilt ggt after bolus with some improvement. 1L NS bolus.  Troponin negative. No chest pain. Palpitations present. CXR largely unremarkable. Uncertain source of tachycardia. Acute onset. Doubt sepsis. Medicine consulted for further w/u and mgmt.  Labs and imaging reviewed by myself and considered in medical decision making if ordered. Imaging interpreted by radiology.   Discussed case with Dr. Manus Gunning who is in agreement with assessment and plan.        I personally performed the services described in this documentation, which was scribed in my presence. The recorded information has been reviewed and is accurate.     Stevie Kern, MD 09/25/12 [phone]

## 2012-09-24 NOTE — ED Notes (Signed)
Iv nss increased to 535mlo/hr

## 2012-09-24 NOTE — ED Notes (Signed)
Blood sugar high 428. Heart beating very fast, then face turned red.

## 2012-09-24 NOTE — H&P (Signed)
Patient's PCP: [name], MD  Chief Complaint: Palpitations  History of Present Illness: Kristen Bowman is a 79 y.o. Caucasian female with history of hypertension, diabetes mellitus type II with renal complications, panic disorder, polymyalgia rheumatica, chronic kidney disease stage II, hyperlipidemia, and irritable bowel syndrome who presents with the above complaints.  Patient reports that around 6 p.m. today she felt palpitations and she noted her heart beating fast.  She presented to the emergency department where she was found to have a heart rate of 144, she was started on a diltiazem drip.  Orthostatic vitals were checked in the emergency department, blood pressure was unchanged however patient tachycardic on standing.  The hospitalist service was asked to admit the patient for further workup.  Patient denies any recent fevers, chills, nausea, vomiting, chest pain, shortness of breath, abdominal pain, diarrhea, dizziness, headaches or vision changes.  She denies any panic attacks.  She denies any changes in her medications.  She denies using caffeine recently, however has had a chocolate donut this morning and this evening.   Review of Systems: All systems reviewed with the patient and positive as per history of present illness, otherwise all other systems are negative.  Past Medical History  Diagnosis Date  . Hypertension   . Diabetes mellitus     T2DM with renal manifestations  . Panic disorder   . Polymyalgia rheumatica   . Hematuria   . Type II or unspecified type diabetes mellitus with renal manifestations, uncontrolled(250.42) 05/29/10    CKD II (mild)  . Proteinuria 05/29/10  . Generalized osteoarthrosis, unspecified site   . Other specified cardiac dysrhythmias   . Hyperlipidemia     mixed  . Irritable bowel syndrome   . Anxiety states    History reviewed. No pertinent past surgical history. Family History  Problem Relation Age of Onset  . Diabetes Sister   . Cancer  Other   . Heart attack Other    History   Social History  . Marital Status: Divorced    Spouse Name: N/A    Number of Children: N/A  . Years of Education: N/A   Occupational History  . Not on file.   Social History Main Topics  . Smoking status: Never Smoker   . Smokeless tobacco: Never Used  . Alcohol Use: No  . Drug Use: No  . Sexually Active: Not on file   Other Topics Concern  . Not on file   Social History Narrative  . No narrative on file   Allergies: Review of patient's allergies indicates no known allergies.  Home Meds: Prior to Admission medications   Medication Sig Start Date End Date Taking? Authorizing Provider  amLODipine (NORVASC) 10 MG tablet Take 10 mg by mouth daily.   Yes Historical Provider, MD  clonazePAM (KLONOPIN) 0.5 MG tablet Take 0.5 mg by mouth 2 (two) times daily. Takes 1/2 tab in a.m. and 1 tab at hs   Yes Historical Provider, MD  insulin aspart protamine-insulin aspart (NOVOLOG 70/30) (70-30) 100 UNIT/ML injection Inject 12-14 Units into the skin 2 (two) times daily with a meal. 12 units in a.m. And 14 units in pm   Yes Historical Provider, MD  metFORMIN (GLUCOPHAGE) 1000 MG tablet Take 1,000 mg by mouth 2 (two) times daily with a meal.     Yes Historical Provider, MD  metoprolol (TOPROL-XL) 100 MG 24 hr tablet Take 150 mg by mouth daily.    Yes Historical Provider, MD  simvastatin (ZOCOR) 20 MG tablet  Take 20 mg by mouth every evening.   Yes Historical Provider, MD    Physical Exam: Blood pressure 157/57, pulse 108, temperature 98.4 F (36.9 C), temperature source Oral, resp. rate 20, SpO2 98.00%. General: Awake, Oriented x3, No acute distress. HEENT: EOMI, Moist mucous membranes Neck: Supple CV: S1 and S2, tachycardic. Lungs: Clear to ascultation bilaterally Abdomen: Soft, Nontender, Nondistended, +bowel sounds. Ext: Good pulses. Trace edema. No clubbing or cyanosis noted. Neuro: Cranial Nerves II-XII grossly intact. Has 5/5 motor  strength in upper and lower extremities. Skin: Slightly flushed, no discernible rashes.  Lab results:  [facility] 09/24/12 1950  NA 141  K 3.5  CL 103  CO2 24  GLUCOSE 294*  BUN 22  CREATININE 1.03  CALCIUM 9.7  MG --  PHOS --    Basename 09/24/12 1950  AST 15  ALT 9  ALKPHOS 108  BILITOT 0.4  PROT 7.4  ALBUMIN 3.8   No results found for this basename: LIPASE:2,AMYLASE:2 in the last 72 hours  Basename 09/24/12 1950  WBC 4.8  NEUTROABS 1.9  HGB 11.3*  HCT 34.9*  MCV 82.1  PLT 189    Basename 09/24/12 1955  CKTOTAL --  CKMB --  CKMBINDEX --  TROPONINI <0.30   No components found with this basename: POCBNP:3 No results found for this basename: DDIMER in the last 72 hours No results found for this basename: HGBA1C:2 in the last 72 hours No results found for this basename: CHOL:2,HDL:2,LDLCALC:2,TRIG:2,CHOLHDL:2,LDLDIRECT:2 in the last 72 hours No results found for this basename: TSH,T4TOTAL,FREET3,T3FREE,THYROIDAB in the last 72 hours No results found for this basename: VITAMINB12:2,FOLATE:2,FERRITIN:2,TIBC:2,IRON:2,RETICCTPCT:2 in the last 72 hours Imaging results:  Dg Chest Portable 1 View  09/24/2012  *RADIOLOGY REPORT*  Clinical Data: Hyperglycemia.  Tachycardia.  Diabetic hypertensive patient.  PORTABLE CHEST - 1 VIEW  Comparison: 06/26/2011.  Findings: Calcified slightly tortuous aorta.  Heart size within normal limits.  No infiltrate, congestive heart failure or pneumothorax.  The patient would eventually benefit from follow-up two-view chest with cardiac leads removed.  IMPRESSION: Calcified mildly tortuous aorta.  No infiltrate or congestive heart failure.  Please see above.   Original Report Authenticated By: Lacy Duverney, M.D.    Other results: EKG: Sinus tachycardia with heart rate of 135.  Assessment & Plan by Problem: Palpitations/sinus tachycardia Etiology unclear.  Patient started on diltiazem drip, titrate for heart rate less than 110, hold if  systolic blood pressure less than 100 or if heart rate less than 60.  Broad differential.  Patient was not orthostatic on admission, but did complain of dry mouth will hydrate the patient on IV fluids.  Check TSH and free T4 for hyperthyroidism.  Will get a 2-D echocardiogram for evaluation.  Uncertain if stimulant (chocolate consumption) resulted in tachycardia.  Patient has a habit of taking metoprolol in the evening, uncertain if patient has reflex tachycardia from not taking metoprolol.  Patient may benefit from a twice a day dosing of metoprolol.  Continue diltiazem drip for now, start metoprolol in the morning, consider weaning off diltiazem drip in the morning.  Patient has a history of unspecified cardiac arrhythmia of unclear etiology in the past, do not have any records available for review, patient reports that she was she in the hospital a few years ago with similar complaints, and was told workup was negative.  Hypertension Improved with diltiazem drip.  Continue to monitor.  Hold amlodipine.  Anxiety/history of panic disorder Continue home benzodiazepine.  Stable.  Type 2 diabetes uncontrolled with  renal manifestation Continue NovoLog 70/30.  Sliding scale insulin.  Check hemoglobin A1c in the morning.  Hold metformin.  Mild Anemia Check anemia panel in the morning.  Chronic kidney disease stage II Renal function at baseline.  Continue to monitor.  Hyperlipidemia Continue statin.  CODE STATUS Full code.  This was discussed with the patient at the time of admission.  Disposition Admit the patient to step down as diltiazem has been titrated as inpatient.  Time spent on admission, talking to the patient, and coordinating care was: 60 mins.  Amora Sheehy A, MD 09/24/2012, 11:08 PM

## 2012-09-24 NOTE — ED Notes (Signed)
EKG given to Dr. Manus Gunning, copy placed in pt chart.

## 2012-09-24 NOTE — ED Notes (Signed)
Admitting doctor here to see 

## 2012-09-24 NOTE — ED Notes (Signed)
1000cc nss infused.  1000cc nss added to run in 135ml/hr

## 2012-09-24 NOTE — ED Notes (Signed)
The pts pulse is 122 .  cardizem dri[p increased to 28ml/hr.  bp good

## 2012-09-24 NOTE — ED Notes (Signed)
The pt is alert at present skin warm and dry.  No pain anywhere.  When she was home and her  Heart started beating fast she  Had no chest pain no sob.  She has had af in the past on no meds  For that.  She also has panic attacks her family is asking are the symptoms the same or similar. Sinus tach on the monitor

## 2012-09-25 ENCOUNTER — Encounter (HOSPITAL_COMMUNITY): Payer: Self-pay | Admitting: *Deleted

## 2012-09-25 DIAGNOSIS — R002 Palpitations: Secondary | ICD-10-CM

## 2012-09-25 DIAGNOSIS — D509 Iron deficiency anemia, unspecified: Secondary | ICD-10-CM

## 2012-09-25 LAB — BASIC METABOLIC PANEL
BUN: 16 mg/dL (ref 6–23)
CO2: 26 mEq/L (ref 19–32)
Chloride: 108 mEq/L (ref 96–112)
Glucose, Bld: 136 mg/dL — ABNORMAL HIGH (ref 70–99)
Potassium: 3.3 mEq/L — ABNORMAL LOW (ref 3.5–5.1)

## 2012-09-25 LAB — CBC
HCT: 30.1 % — ABNORMAL LOW (ref 36.0–46.0)
Hemoglobin: 9.8 g/dL — ABNORMAL LOW (ref 12.0–15.0)
MCH: 26.6 pg (ref 26.0–34.0)
MCHC: 32.6 g/dL (ref 30.0–36.0)
MCV: 81.8 fL (ref 78.0–100.0)

## 2012-09-25 LAB — RETICULOCYTES
RBC.: 3.68 MIL/uL — ABNORMAL LOW (ref 3.87–5.11)
Retic Count, Absolute: 51.5 10*3/uL (ref 19.0–186.0)

## 2012-09-25 LAB — TSH: TSH: 2.554 u[IU]/mL (ref 0.350–4.500)

## 2012-09-25 LAB — HEMOGLOBIN A1C: Mean Plasma Glucose: 154 mg/dL — ABNORMAL HIGH (ref <117)

## 2012-09-25 LAB — VITAMIN B12: Vitamin B-12: 183 pg/mL — ABNORMAL LOW (ref 211–911)

## 2012-09-25 LAB — GLUCOSE, CAPILLARY
Glucose-Capillary: 293 mg/dL — ABNORMAL HIGH (ref 70–99)
Glucose-Capillary: 113 mg/dL — ABNORMAL HIGH (ref 70–99)

## 2012-09-25 LAB — FOLATE: Folate: >20 ng/mL

## 2012-09-25 MED ORDER — DILTIAZEM HCL 100 MG IV SOLR
5.0000 mg/h | INTRAVENOUS | Status: DC
  Administered 2012-09-25 (×2): 10 mg/h via INTRAVENOUS
  Filled 2012-09-25: qty 100

## 2012-09-25 MED ORDER — SIMVASTATIN 20 MG PO TABS
20.0000 mg | ORAL_TABLET | Freq: Every evening | ORAL | Status: DC
  Administered 2012-09-25: 20 mg via ORAL
  Filled 2012-09-25 (×2): qty 1

## 2012-09-25 MED ORDER — INSULIN ASPART PROT & ASPART (70-30 MIX) 100 UNIT/ML ~~LOC~~ SUSP
14.0000 [IU] | Freq: Every day | SUBCUTANEOUS | Status: DC
  Administered 2012-09-26: 14 [IU] via SUBCUTANEOUS
  Filled 2012-09-25: qty 10

## 2012-09-25 MED ORDER — SODIUM CHLORIDE 0.9 % IV SOLN
25.0000 mg | Freq: Once | INTRAVENOUS | Status: AC
  Administered 2012-09-25 (×2): 25 mg via INTRAVENOUS
  Filled 2012-09-25: qty 2

## 2012-09-25 MED ORDER — CLONAZEPAM 0.5 MG PO TABS
0.5000 mg | ORAL_TABLET | Freq: Two times a day (BID) | ORAL | Status: DC
  Administered 2012-09-25 – 2012-09-26 (×4): 0.5 mg via ORAL
  Filled 2012-09-25 (×4): qty 1

## 2012-09-25 MED ORDER — POTASSIUM CHLORIDE CRYS ER 20 MEQ PO TBCR
40.0000 meq | EXTENDED_RELEASE_TABLET | ORAL | Status: AC
  Administered 2012-09-25 (×2): 40 meq via ORAL
  Filled 2012-09-25 (×2): qty 2

## 2012-09-25 MED ORDER — INSULIN ASPART PROT & ASPART (70-30 MIX) 100 UNIT/ML ~~LOC~~ SUSP
12.0000 [IU] | Freq: Every day | SUBCUTANEOUS | Status: DC
  Administered 2012-09-25: 12 [IU] via SUBCUTANEOUS
  Filled 2012-09-25: qty 10

## 2012-09-25 MED ORDER — ACETAMINOPHEN 325 MG PO TABS: 650.0000 mg | ORAL_TABLET | Freq: Four times a day (QID) | ORAL | Status: DC | PRN

## 2012-09-25 MED ORDER — INSULIN ASPART PROT & ASPART (70-30 MIX) 100 UNIT/ML ~~LOC~~ SUSP
16.0000 [IU] | Freq: Every day | SUBCUTANEOUS | Status: DC
  Administered 2012-09-25: 16 [IU] via SUBCUTANEOUS
  Filled 2012-09-25: qty 10

## 2012-09-25 MED ORDER — ACETAMINOPHEN 650 MG RE SUPP: 650.0000 mg | Freq: Four times a day (QID) | RECTAL | Status: DC | PRN

## 2012-09-25 MED ORDER — ONDANSETRON HCL 4 MG PO TABS: 4.0000 mg | ORAL_TABLET | Freq: Four times a day (QID) | ORAL | Status: DC | PRN

## 2012-09-25 MED ORDER — METOPROLOL SUCCINATE ER 50 MG PO TB24
150.0000 mg | ORAL_TABLET | Freq: Every day | ORAL | Status: DC
  Administered 2012-09-25 – 2012-09-26 (×2): 150 mg via ORAL
  Filled 2012-09-25 (×2): qty 1

## 2012-09-25 MED ORDER — FERROUS GLUCONATE 324 (38 FE) MG PO TABS
324.0000 mg | ORAL_TABLET | Freq: Three times a day (TID) | ORAL | Status: DC
  Administered 2012-09-25 – 2012-09-26 (×3): 324 mg via ORAL
  Filled 2012-09-25 (×6): qty 1

## 2012-09-25 MED ORDER — ENOXAPARIN SODIUM 40 MG/0.4ML ~~LOC~~ SOLN
40.0000 mg | SUBCUTANEOUS | Status: DC
  Administered 2012-09-25: 40 mg via SUBCUTANEOUS
  Filled 2012-09-25 (×2): qty 0.4

## 2012-09-25 MED ORDER — SODIUM CHLORIDE 0.9 % IV SOLN
125.0000 mg | Freq: Once | INTRAVENOUS | Status: AC
  Administered 2012-09-25: 125 mg via INTRAVENOUS

## 2012-09-25 MED ORDER — INSULIN ASPART PROT & ASPART (70-30 MIX) 100 UNIT/ML ~~LOC~~ SUSP: 12.0000 [IU] | Freq: Two times a day (BID) | SUBCUTANEOUS | Status: DC

## 2012-09-25 MED ORDER — INSULIN ASPART PROT & ASPART (70-30 MIX) 100 UNIT/ML ~~LOC~~ SUSP: 14.0000 [IU] | Freq: Every day | SUBCUTANEOUS | Status: DC

## 2012-09-25 MED ORDER — INSULIN ASPART 100 UNIT/ML ~~LOC~~ SOLN
0.0000 [IU] | Freq: Three times a day (TID) | SUBCUTANEOUS | Status: DC
  Administered 2012-09-25: 5 [IU] via SUBCUTANEOUS

## 2012-09-25 MED ORDER — SODIUM CHLORIDE 0.9 % IV SOLN
INTRAVENOUS | Status: DC
  Administered 2012-09-25: 100 mL/h via INTRAVENOUS

## 2012-09-25 MED ORDER — INSULIN ASPART 100 UNIT/ML ~~LOC~~ SOLN: 0.0000 [IU] | Freq: Every day | SUBCUTANEOUS | Status: DC

## 2012-09-25 MED ORDER — INSULIN ASPART 100 UNIT/ML ~~LOC~~ SOLN
0.0000 [IU] | Freq: Every day | SUBCUTANEOUS | Status: DC
  Administered 2012-09-25: 3 [IU] via SUBCUTANEOUS

## 2012-09-25 MED ORDER — ONDANSETRON HCL 4 MG/2ML IJ SOLN: 4.0000 mg | Freq: Four times a day (QID) | INTRAMUSCULAR | Status: DC | PRN

## 2012-09-25 NOTE — ED Provider Notes (Signed)
I saw and evaluated the patient, reviewed the resident's note and I agree with the findings and plan.  Acute onset of palpitations, flushing, without chest pain or SOB. No distress. Very dry mucus membranes.   Hypertensive with apparent regular narrow complex tachycardia. P waves evident. Sinus versus atrial flutter. Improvement with cardizem gtt and fluids.  Troponin negative. Unclear etiology of tachycardia. No evidence of infection. Check tsh.  CRITICAL CARE Performed by: Glynn Octave   Total critical care time: 30  Critical care time was exclusive of separately billable procedures and treating other patients.  Critical care was necessary to treat or prevent imminent or life-threatening deterioration.  Critical care was time spent personally by me on the following activities: development of treatment plan with patient and/or surrogate as well as nursing, discussions with consultants, evaluation of patient's response to treatment, examination of patient, obtaining history from patient or surrogate, ordering and performing treatments and interventions, ordering and review of laboratory studies, ordering and review of radiographic studies, pulse oximetry and re-evaluation of patient's condition.   Glynn Octave, MD 09/25/12 [phone]

## 2012-09-25 NOTE — Progress Notes (Signed)
  Echocardiogram 2D Echocardiogram has been performed.  [place] 09/25/2012, 9:04 AM

## 2012-09-25 NOTE — Care Management Note (Signed)
    Page 1 of 1   09/25/2012     8:52:09 AM   CARE MANAGEMENT NOTE 09/25/2012  Patient:  Kristen Bowman,Kristen Bowman   Account Number:  [id number]  Date Initiated:  09/25/2012  Documentation initiated by:  Junius Creamer  Subjective/Objective Assessment:   adm w tachycardia     Action/Plan:   lives alone, pcp dr Philipp Deputy   Anticipated DC Date:     Anticipated DC Plan:        DC Planning Services  CM consult      Choice offered to / List presented to:             Status of service:   Medicare Important Message given?   (If response is "NO", the following Medicare IM given date fields will be blank) Date Medicare IM given:   Date Additional Medicare IM given:    Discharge Disposition:    Per UR Regulation:  Reviewed for med. necessity/level of care/duration of stay  If discussed at Long Length of Stay Meetings, dates discussed:    Comments:  1/31 0851 debbie Sunni Richardson rn,bsn

## 2012-09-25 NOTE — Progress Notes (Signed)
[facility] Progress Note [place] TEAM 1 - Stepdown/ICU TEAM   Kristen Bowman ZOX:[id number] DOB: [birth year] DOA: 09/24/2012 PCP: [name], MD  Brief narrative: Kristen Bowman is a 80 y.o. Caucasian female with history of hypertension, diabetes mellitus type II with renal complications, panic disorder, polymyalgia rheumatica, chronic kidney disease stage II, hyperlipidemia, and irritable bowel syndrome who presents with the above complaints. Patient reports that around 6 p.m. today she felt palpitations and she noted her heart beating fast. She presented to the emergency department where she was found to have a heart rate of 144, she was started on a diltiazem drip. Orthostatic vitals were checked in the emergency department, blood pressure was unchanged however patient tachycardic on standing. The hospitalist service was asked to admit the patient for further workup. Patient denies any recent fevers, chills, nausea, vomiting, chest pain, shortness of breath, abdominal pain, diarrhea, dizziness, headaches or vision changes. She denies any panic attacks. She denies any changes in her medications. She denies using caffeine recently, however has had a chocolate donut this morning and this evening.   Assessment/Plan: Principal Problem:  *Palpitations/Tachycardia Suspect that the patient may have missed a dose of her metoprolol. In addition she is anemic and likely dehydrated from persistent hyperglycemia which likely predisposed her to tachycardia. Resuming beta blocker today discontinuing Cardizem infusion.  Active Problems:  Type II or unspecified type diabetes mellitus with renal manifestations, uncontrolled(250.42) Sugars likely uncontrolled to 10 pound weight gain recently. Will increase her Lantus slightly today-she will followup with her physician on February 6 where sugars can be followed up and insulin increased. I've advised her to lose weight with diet and exercise.    Anemia,  iron deficiency Hemoglobin has dropped from 11-9 with a little hydration. Iron levels are quite low. I will give her a dose of IV iron today and start by mouth iron.   Hypertension Resume amlodipine and metoprolol   Anxiety/Panic disorder Continue benzodiazepines   CKD (chronic kidney disease), stage II Renal function stable    Code Status: Full code Family Communication: None Disposition Plan: Continue to follow in step down DVT prophylaxis: Lovenox   HPI/Subjective: Visions palpitations have resolved. We've had a long discussion about why this may have happened including uncontrolled blood sugars and anemia. I reviewed her blood sugars it appears him the most part that they are routinely over 100 and were noted to be 3 or 400 a few times including on the day of admission where they were greater than 400.   Objective: Blood pressure 131/46, pulse 77, temperature 98.4 F (36.9 C), temperature source Oral, resp. rate 16, height 5\' 6"  (1.676 m), weight 69.5 kg (153 lb 3.5 oz), SpO2 94.00%.  Intake/Output Summary (Last 24 hours) at 09/25/12 1606 Last data filed at 09/25/12 1000  Gross per 24 hour  Intake 2230.17 ml  Output   1200 ml  Net 1030.17 ml     Exam: General: No acute respiratory distress Lungs: Clear to auscultation bilaterally without wheezes or crackles Cardiovascular: Regular rate and rhythm without murmur gallop or rub normal S1 and S2 Abdomen: Nontender, nondistended, soft, bowel sounds positive, no rebound, no ascites, no appreciable mass Extremities: No significant cyanosis, clubbing, or edema bilateral lower extremities  Data Reviewed: Basic Metabolic Panel:  Lab 09/25/12 8119 09/24/12 1950  NA 142 141  K 3.3* 3.5  CL 108 103  CO2 26 24  GLUCOSE 136* 294*  BUN 16 22  CREATININE 0.87 1.03  CALCIUM 9.2 9.7  MG -- --  PHOS -- --   Liver Function Tests:  Lab 09/24/12 1950  AST 15  ALT 9  ALKPHOS 108  BILITOT 0.4  PROT 7.4  ALBUMIN 3.8    No results found for this basename: LIPASE:5,AMYLASE:5 in the last 168 hours No results found for this basename: AMMONIA:5 in the last 168 hours CBC:  Lab 09/25/12 0435 09/24/12 1950  WBC 5.9 4.8  NEUTROABS -- 1.9  HGB 9.8* 11.3*  HCT 30.1* 34.9*  MCV 81.8 82.1  PLT 174 189   Cardiac Enzymes:  Lab 09/25/12 0435 09/24/12 2307 09/24/12 1955  CKTOTAL -- -- --  CKMB -- -- --  CKMBINDEX -- -- --  TROPONINI <0.30 <0.30 <0.30   BNP (last 3 results) No results found for this basename: PROBNP:3 in the last 8760 hours CBG:  Lab 09/25/12 1202 09/25/12 0914 09/25/12 0107 09/24/12 1952  GLUCAP 118* 214* 293* 274*    Recent Results (from the past 240 hour(s))  MRSA PCR SCREENING     Status: Normal   Collection Time   09/25/12 12:46 AM      Component Value Range Status Comment   MRSA by PCR NEGATIVE  NEGATIVE Final      Studies:  Recent x-ray studies have been reviewed in detail by the Attending Physician  Scheduled Meds:  Reviewed in detail by the Attending Physician   [facility]  If 7PM-7AM, please contact night-coverage [email or web address] Password [email or web address] 09/25/2012, 4:06 PM   LOS: 1 day

## 2012-09-26 LAB — CBC
HCT: 37.3 % (ref 36.0–46.0)
MCHC: 32.2 g/dL (ref 30.0–36.0)
MCV: 83.4 fL (ref 78.0–100.0)
RDW: 15.9 % — ABNORMAL HIGH (ref 11.5–15.5)

## 2012-09-26 MED ORDER — INSULIN ASPART PROT & ASPART (70-30 MIX) 100 UNIT/ML ~~LOC~~ SUSP: 16.0000 [IU] | Freq: Every day | SUBCUTANEOUS | Status: DC

## 2012-09-26 MED ORDER — INSULIN ASPART PROT & ASPART (70-30 MIX) 100 UNIT/ML ~~LOC~~ SUSP: 14.0000 [IU] | Freq: Every day | SUBCUTANEOUS | Status: DC

## 2012-09-26 NOTE — Plan of Care (Signed)
Problem: Phase I Progression Outcomes Goal: Heart rate or rhythm control medication Outcome: Completed/Met Date Met:  09/26/12 Pt on IV Cardizem; converted to NSR; pt also had K+ replacement

## 2012-09-26 NOTE — Progress Notes (Signed)
Reviewed pt's AVS with patient and son using teachback method. No question or concerns upon completion.  Dawson Bills, RN

## 2012-09-26 NOTE — Progress Notes (Signed)
Pt discharged to home with belongings via wheelchair. Accompanied by husband and son. NO complaints at this time.  Dawson Bills, RN

## 2012-09-28 NOTE — Discharge Summary (Signed)
Physician Discharge Summary  Kristen Bowman ZOX:[id number] DOB: 1931/01/27 DOA: 09/24/2012  PCP: [name], MD  Admit date: 09/24/2012 Discharge date: 09/28/2012  Time spent: >45 minutes  Recommendations for Outpatient Follow-up:  1. PO iron replacement needed 2. Cont to follow sugars and adjust insulin  Discharge Diagnoses:  Principal Problem:  *Palpitations Active Problems:  Tachycardia  Type II or unspecified type diabetes mellitus with renal manifestations, uncontrolled(250.42)  Anemia, iron deficiency  Hypertension  Panic disorder  Hyperlipidemia  CKD (chronic kidney disease), stage II   Discharge Condition: stable  Diet recommendation: heart healthy and diabetic diet  Filed Weights   09/25/12 0049 09/25/12 0443 09/26/12 0500  Weight: 69.5 kg (153 lb 3.5 oz) 69.5 kg (153 lb 3.5 oz) 69 kg (152 lb 1.9 oz)    History of present illness:  Kristen Bowman is a 79 y.o. Caucasian female with history of hypertension, diabetes mellitus type II with renal complications, panic disorder, polymyalgia rheumatica, chronic kidney disease stage II, hyperlipidemia, and irritable bowel syndrome who presents with the above complaints. Patient reports that around 6 p.m. today she felt palpitations and she noted her heart beating fast. She presented to the emergency department where she was found to have a heart rate of 144, she was started on a diltiazem drip. Orthostatic vitals were checked in the emergency department, blood pressure was unchanged however patient tachycardic on standing. The hospitalist service was asked to admit the patient for further workup. Patient denies any recent fevers, chills, nausea, vomiting, chest pain, shortness of breath, abdominal pain, diarrhea, dizziness, headaches or vision changes. She denies any panic attacks. She denies any changes in her medications   Hospital Course:  Palpitations/Tachycardia  Suspect that the patient may have missed a dose of her  metoprolol. In addition she is anemic and likely dehydrated from persistent hyperglycemia which likely predisposed her to tachycardia. BUN/Cr ratio was elevated confirming dehydration Initially placed on a Cardizem infusion to control tachycardia and palpitations. Hydrated and, the next day resumed Metoprolol - Pt doing well without tachycardia.   ECHO reports- EF 65 % with gr 1 Diastolic dysfunction- no wall motion abnormalities   Active Problems:  Type II or unspecified type diabetes mellitus with renal manifestations, uncontrolled(250.42)  Sugars likely uncontrolled to 10 pound weight gain recently. Her log book reveals sugars to be mostly greater than 200 and at times 300- 400.  Her HbA1c is 7.0.   Have slightly increased her insulin doses- she will followup with her physician on February 6 where sugars can be followed up and insulin increased. I've advised her to lose weight with diet and exercise.  Her son adamantly states that she eats too many carbs although she denies it.   Anemia, iron deficiency  Hemoglobin has dropped from 11 to 9 with a little hydration. Iron levels are quite low. I have given her a dose of IV iron. She need to f/u with her PCP for further management.  Hypertension  Resume amlodipine and metoprolol   Anxiety/Panic disorder  Continue benzodiazepines   CKD (chronic kidney disease), stage II  Renal function stable   Discharge Exam: Filed Vitals:   09/26/12 0349 09/26/12 0500 09/26/12 0812 09/26/12 0813  BP: 151/67  152/51   Pulse:      Temp: 98.6 F (37 C)   98.5 F (36.9 C)  TempSrc: Oral   Oral  Resp:    16  Height:      Weight:  69 kg (152 lb 1.9 oz)  SpO2: 96%   95%    General: alert, oriented x 3 Cardiovascular: RRR, no murmurs Respiratory: CTA b/l   Discharge Instructions  Discharge Orders    Future Orders Please Complete By Expires   Diet - low sodium heart healthy      Comments:   Diabetic/ carb modified diet   Increase activity  slowly      Discharge instructions      Comments:   Continue to keep a log of sugars and take to doctor next week.       Medication List     As of 09/28/2012  9:03 AM    TAKE these medications         amLODipine 10 MG tablet   Commonly known as: NORVASC   Take 10 mg by mouth daily.      clonazePAM 0.5 MG tablet   Commonly known as: KLONOPIN   Take 0.5 mg by mouth 2 (two) times daily. Takes 1/2 tab in a.m. and 1 tab at hs      insulin aspart protamine-insulin aspart (70-30) 100 UNIT/ML injection   Commonly known as: NOVOLOG 70/30   Inject 16 Units into the skin daily with supper.      insulin aspart protamine-insulin aspart (70-30) 100 UNIT/ML injection   Commonly known as: NOVOLOG 70/30   Inject 14 Units into the skin daily with breakfast.      metFORMIN 1000 MG tablet   Commonly known as: GLUCOPHAGE   Take 1,000 mg by mouth 2 (two) times daily with a meal.      metoprolol succinate 100 MG 24 hr tablet   Commonly known as: TOPROL-XL   Take 150 mg by mouth daily.      simvastatin 20 MG tablet   Commonly known as: ZOCOR   Take 20 mg by mouth every evening.          The results of significant diagnostics from this hospitalization (including imaging, microbiology, ancillary and laboratory) are listed below for reference.    Significant Diagnostic Studies: Dg Chest Portable 1 View  09/24/2012  *RADIOLOGY REPORT*  Clinical Data: Hyperglycemia.  Tachycardia.  Diabetic hypertensive patient.  PORTABLE CHEST - 1 VIEW  Comparison: 06/26/2011.  Findings: Calcified slightly tortuous aorta.  Heart size within normal limits.  No infiltrate, congestive heart failure or pneumothorax.  The patient would eventually benefit from follow-up two-view chest with cardiac leads removed.  IMPRESSION: Calcified mildly tortuous aorta.  No infiltrate or congestive heart failure.  Please see above.   Original Report Authenticated By: Lacy Duverney, M.D.     Microbiology: Recent Results (from the  past 240 hour(s))  MRSA PCR SCREENING     Status: Normal   Collection Time   09/25/12 12:46 AM      Component Value Range Status Comment   MRSA by PCR NEGATIVE  NEGATIVE Final      Labs: Basic Metabolic Panel:  Lab 09/25/12 1610 09/24/12 1950  NA 142 141  K 3.3* 3.5  CL 108 103  CO2 26 24  GLUCOSE 136* 294*  BUN 16 22  CREATININE 0.87 1.03  CALCIUM 9.2 9.7  MG -- --  PHOS -- --   Liver Function Tests:  Lab 09/24/12 1950  AST 15  ALT 9  ALKPHOS 108  BILITOT 0.4  PROT 7.4  ALBUMIN 3.8   No results found for this basename: LIPASE:5,AMYLASE:5 in the last 168 hours No results found for this basename: AMMONIA:5 in the last 168 hours CBC:  Lab 09/26/12 0545 09/25/12 0435 09/24/12 1950  WBC 4.8 5.9 4.8  NEUTROABS -- -- 1.9  HGB 12.0 9.8* 11.3*  HCT 37.3 30.1* 34.9*  MCV 83.4 81.8 82.1  PLT 224 174 189   Cardiac Enzymes:  Lab 09/25/12 0435 09/24/12 2307 09/24/12 1955  CKTOTAL -- -- --  CKMB -- -- --  CKMBINDEX -- -- --  TROPONINI <0.30 <0.30 <0.30   BNP: BNP (last 3 results) No results found for this basename: PROBNP:3 in the last 8760 hours CBG:  Lab 09/26/12 0812 09/25/12 2225 09/25/12 2120 09/25/12 1707 09/25/12 1202  GLUCAP 281* 113* 55* 156* 118*       Signed:  Laylamarie Meuser  [facility] 09/28/2012, 9:03 AM

## 2012-10-30 ENCOUNTER — Encounter (HOSPITAL_COMMUNITY): Payer: Self-pay | Admitting: Pharmacy Technician

## 2012-11-02 ENCOUNTER — Encounter (HOSPITAL_COMMUNITY): Payer: Self-pay | Admitting: *Deleted

## 2012-11-04 ENCOUNTER — Ambulatory Visit (HOSPITAL_COMMUNITY): Payer: Medicare Other | Admitting: Anesthesiology

## 2012-11-04 ENCOUNTER — Ambulatory Visit (HOSPITAL_COMMUNITY)
Admission: RE | Admit: 2012-11-04 | Discharge: 2012-11-04 | Disposition: A | Discharge status: Home / Self Care | Payer: Medicare Other | Source: Ambulatory Visit | Attending: Gastroenterology | Admitting: Gastroenterology

## 2012-11-04 ENCOUNTER — Encounter (HOSPITAL_COMMUNITY): Payer: Self-pay

## 2012-11-04 ENCOUNTER — Encounter (HOSPITAL_COMMUNITY): Payer: Self-pay | Admitting: Anesthesiology

## 2012-11-04 ENCOUNTER — Encounter (HOSPITAL_COMMUNITY)
Admission: RE | Disposition: A | Discharge status: Home / Self Care | Payer: Self-pay | Source: Ambulatory Visit | Attending: Gastroenterology

## 2012-11-04 DIAGNOSIS — E785 Hyperlipidemia, unspecified: Secondary | ICD-10-CM | POA: Insufficient documentation

## 2012-11-04 DIAGNOSIS — K644 Residual hemorrhoidal skin tags: Secondary | ICD-10-CM | POA: Insufficient documentation

## 2012-11-04 DIAGNOSIS — K2970 Gastritis, unspecified, without bleeding: Secondary | ICD-10-CM | POA: Insufficient documentation

## 2012-11-04 DIAGNOSIS — E119 Type 2 diabetes mellitus without complications: Secondary | ICD-10-CM | POA: Insufficient documentation

## 2012-11-04 DIAGNOSIS — Z79899 Other long term (current) drug therapy: Secondary | ICD-10-CM | POA: Insufficient documentation

## 2012-11-04 DIAGNOSIS — D509 Iron deficiency anemia, unspecified: Secondary | ICD-10-CM | POA: Insufficient documentation

## 2012-11-04 DIAGNOSIS — M353 Polymyalgia rheumatica: Secondary | ICD-10-CM | POA: Insufficient documentation

## 2012-11-04 DIAGNOSIS — I10 Essential (primary) hypertension: Secondary | ICD-10-CM | POA: Insufficient documentation

## 2012-11-04 HISTORY — DX: Anemia, unspecified: D64.9

## 2012-11-04 HISTORY — PX: ESOPHAGOGASTRODUODENOSCOPY: SHX5428

## 2012-11-04 HISTORY — PX: COLONOSCOPY: SHX5424

## 2012-11-04 SURGERY — EGD (ESOPHAGOGASTRODUODENOSCOPY)
Anesthesia: Monitor Anesthesia Care

## 2012-11-04 MED ORDER — LACTATED RINGERS IV SOLN
INTRAVENOUS | Status: DC
  Administered 2012-11-04: 08:00:00 via INTRAVENOUS

## 2012-11-04 MED ORDER — METOPROLOL TARTRATE 1 MG/ML IV SOLN
INTRAVENOUS | Status: AC
  Filled 2012-11-04: qty 5

## 2012-11-04 MED ORDER — FENTANYL CITRATE 0.05 MG/ML IJ SOLN
INTRAMUSCULAR | Status: DC | PRN
  Administered 2012-11-04 (×2): 50 ug via INTRAVENOUS

## 2012-11-04 MED ORDER — PROPOFOL 10 MG/ML IV EMUL
INTRAVENOUS | Status: DC | PRN
  Administered 2012-11-04: 100 ug/kg/min via INTRAVENOUS

## 2012-11-04 MED ORDER — SODIUM CHLORIDE 0.9 % IV SOLN: INTRAVENOUS | Status: DC

## 2012-11-04 MED ORDER — METOPROLOL TARTRATE 1 MG/ML IV SOLN
INTRAVENOUS | Status: DC | PRN
  Administered 2012-11-04: 1 mg via INTRAVENOUS

## 2012-11-04 MED ORDER — MIDAZOLAM HCL 5 MG/5ML IJ SOLN
INTRAMUSCULAR | Status: DC | PRN
  Administered 2012-11-04 (×2): 1 mg via INTRAVENOUS

## 2012-11-04 MED ORDER — BUTAMBEN-TETRACAINE-BENZOCAINE 2-2-14 % EX AERO
INHALATION_SPRAY | CUTANEOUS | Status: DC | PRN
  Administered 2012-11-04: 2 via TOPICAL

## 2012-11-04 NOTE — Transfer of Care (Signed)
Immediate Anesthesia Transfer of Care Note  Patient: Kristen Bowman  Procedure(s) Performed: Procedure(s): ESOPHAGOGASTRODUODENOSCOPY (EGD) (N/A) COLONOSCOPY (N/A)  Patient Location: PACU  Anesthesia Type:MAC  Level of Consciousness: awake, alert  and oriented  Airway & Oxygen Therapy: Patient Spontanous Breathing and Patient connected to nasal cannula oxygen  Post-op Assessment: Report given to PACU RN  Post vital signs: Reviewed and stable  Complications: No apparent anesthesia complications

## 2012-11-04 NOTE — Op Note (Signed)
[facility] [address] [place], [address]   COLONOSCOPY PROCEDURE REPORT  PATIENT: Kristen Bowman, Kristen Bowman  MR#: [id number] BIRTHDATE: [birth year] , 81  yrs. old GENDER: Female ENDOSCOPIST: Willis Modena, MD REFERRED XB:[id number] Shaw, M.D. PROCEDURE DATE:  11/04/2012 PROCEDURE:   Colonoscopy with snare polypectomy ASA CLASS:   Class III INDICATIONS:Iron Deficiency Anemia. MEDICATIONS: MAC sedation, administered by CRNA  DESCRIPTION OF PROCEDURE:   After the risks benefits and alternatives of the procedure were thoroughly explained, informed consent was obtained.  A digital rectal exam revealed external hemorrhoids.   The Pentax Ped Colon [id number]  endoscope was introduced through the anus and advanced to the cecum, which was identified by both the appendix and ileocecal valve. No adverse events experienced.   The quality of the prep was good.  The instrument was then slowly withdrawn as the colon was fully examined.    Findings: Digital rectal exam showed external hemorrhoids, otherwise normal. Prep quality good.  5mm rectal polyp, removed with snare cautery. Otherwise normal colonoscopy without other polyps, masses, vascular ectasias, or inflammatory changes.  Retroflexed views of rectum showed small rectal polyp, otherwise normal.       Withdrawal time was about 10 minutes     .  The scope was withdrawn and the procedure completed.  ENDOSCOPIC IMPRESSION:     As above.  Removal of small rectal polyp. No source of iron-deficiency anemia identified.  RECOMMENDATIONS:     1.  Watch for potential complications of procedure. 2.  Await polypectomy results. 3.  Oral iron supplementation. 4.  Repeat CBC in 4-6 weeks; if anemia persists, consider capsule endoscopy versus CT enterography. 5.  Would not repeat any routine surveillance colonoscopies in light of patient's age. 6.  Follow-up with [facility] after upcoming CBC done.  eSigned:  Willis Modena, MD  11/04/2012 9:24 AM   cc:

## 2012-11-04 NOTE — H&P (Signed)
Patient interval history reviewed.  Patient examined again.  There has been no change from documented H/P dated 10/29/12 (scanned into chart from our office) except as documented above.  Assessment:  1.  Iron-deficiency anemia.  Plan:  1.  Endoscopy and Colonoscopy. 2.  Risks (bleeding, infection, bowel perforation that could require surgery, sedation-related changes in cardiopulmonary systems), benefits (identification and possible treatment of source of symptoms, exclusion of certain causes of symptoms), and alternatives (watchful waiting, radiographic imaging studies, empiric medical treatment) of upper endoscopy (EGD) were explained to patient in detail and patient wishes to proceed. 3.  Risks (bleeding, infection, bowel perforation that could require surgery, sedation-related changes in cardiopulmonary systems), benefits (identification and possible treatment of source of symptoms, exclusion of certain causes of symptoms), and alternatives (watchful waiting, radiographic imaging studies, empiric medical treatment) of colonoscopy were explained to patient in detail and patient wishes to proceed.

## 2012-11-04 NOTE — Op Note (Signed)
[facility] [address] [place], [address]   ENDOSCOPY PROCEDURE REPORT  PATIENT: Kristen, Bowman  MR#: [id number] BIRTHDATE: May 22, 1931 , 81  yrs. old GENDER: Female ENDOSCOPIST: Willis Modena, MD REFERRED BY:  [name], M.D. PROCEDURE DATE:  11/04/2012 PROCEDURE:  EGD w/ biopsy ASA CLASS:     Class III INDICATIONS:  Iron deficiency anemia. MEDICATIONS: MAC sedation, administered by CRNA TOPICAL ANESTHETIC: Cetacaine Spray  DESCRIPTION OF PROCEDURE: After the risks benefits and alternatives of the procedure were thoroughly explained, informed consent was obtained.  The Pentax Gastroscope [id number] endoscope was introduced through the mouth and advanced to the second portion of the duodenum. Without limitations.  The instrument was slowly withdrawn as the mucosa was fully examined.     Findings: Normal esophagus.  Mild gastritis, otherwise normal stomach and pylorus.  Normal duodenum to the second portion; random duodenal biopsies were taken with cold forceps to evaluate for celiac disease.           The scope was then withdrawn from the patient and the procedure completed.  ENDOSCOPIC IMPRESSION:     Mild gastritis, otherwise normal.  No source of iron-deficiency anemia were identified.  RECOMMENDATIONS:     1.  Watch for potential complications of procedure. 2.  Await biopsy results. 3.  Proceed with colonoscopy.  eSigned:  Willis Modena, MD 11/04/2012 9:19 AM   CC:

## 2012-11-04 NOTE — Anesthesia Postprocedure Evaluation (Signed)
  Anesthesia Post-op Note  Patient: Kristen Bowman  Procedure(s) Performed: Procedure(s) (LRB): ESOPHAGOGASTRODUODENOSCOPY (EGD) (N/A) COLONOSCOPY (N/A)  Patient Location: PACU  Anesthesia Type: MAC  Level of Consciousness: awake and alert   Airway and Oxygen Therapy: Patient Spontanous Breathing  Post-op Pain: mild  Post-op Assessment: Post-op Vital signs reviewed, Patient's Cardiovascular Status Stable, Respiratory Function Stable, Patent Airway and No signs of Nausea or vomiting  Last Vitals:  Filed Vitals:   11/04/12 0923  BP: 136/57  Pulse:   Temp:   Resp: 16    Post-op Vital Signs: stable   Complications: No apparent anesthesia complications

## 2012-11-04 NOTE — Anesthesia Preprocedure Evaluation (Addendum)
Anesthesia Evaluation  Patient identified by MRN, date of birth, ID band Patient awake    Reviewed: Allergy & Precautions, H&P , NPO status , Patient's Chart, lab work & pertinent test results  Airway Mallampati: II TM Distance: >3 FB Neck ROM: Full    Dental no notable dental hx.    Pulmonary neg pulmonary ROS,  breath sounds clear to auscultation  Pulmonary exam normal       Cardiovascular hypertension, Pt. on medications + dysrhythmias Supra Ventricular Tachycardia Rhythm:Regular Rate:Normal     Neuro/Psych negative neurological ROS  negative psych ROS   GI/Hepatic negative GI ROS, Neg liver ROS,   Endo/Other  diabetes, Insulin Dependent  Renal/GU negative Renal ROS  negative genitourinary   Musculoskeletal negative musculoskeletal ROS (+)   Abdominal   Peds negative pediatric ROS (+)  Hematology negative hematology ROS (+)   Anesthesia Other Findings   Reproductive/Obstetrics negative OB ROS                          Anesthesia Physical Anesthesia Plan  ASA: III  Anesthesia Plan: MAC   Post-op Pain Management:    Induction: Intravenous  Airway Management Planned: Nasal Cannula  Additional Equipment:   Intra-op Plan:   Post-operative Plan:   Informed Consent: I have reviewed the patients History and Physical, chart, labs and discussed the procedure including the risks, benefits and alternatives for the proposed anesthesia with the patient or authorized representative who has indicated his/her understanding and acceptance.     Plan Discussed with: CRNA and Surgeon  Anesthesia Plan Comments:         Anesthesia Quick Evaluation

## 2012-11-05 ENCOUNTER — Encounter (HOSPITAL_COMMUNITY): Payer: Self-pay | Admitting: Gastroenterology

## 2013-06-25 ENCOUNTER — Other Ambulatory Visit: Payer: Self-pay | Admitting: Family Medicine

## 2014-05-26 ENCOUNTER — Other Ambulatory Visit (HOSPITAL_COMMUNITY): Payer: Self-pay | Admitting: Family Medicine

## 2014-05-26 ENCOUNTER — Ambulatory Visit (HOSPITAL_COMMUNITY): Payer: Medicare PPO | Attending: Family Medicine

## 2014-05-26 DIAGNOSIS — I129 Hypertensive chronic kidney disease with stage 1 through stage 4 chronic kidney disease, or unspecified chronic kidney disease: Secondary | ICD-10-CM | POA: Diagnosis not present

## 2014-05-26 DIAGNOSIS — N181 Chronic kidney disease, stage 1: Secondary | ICD-10-CM | POA: Insufficient documentation

## 2014-05-26 DIAGNOSIS — R609 Edema, unspecified: Secondary | ICD-10-CM | POA: Diagnosis not present

## 2014-05-26 DIAGNOSIS — E785 Hyperlipidemia, unspecified: Secondary | ICD-10-CM | POA: Insufficient documentation

## 2014-05-26 DIAGNOSIS — E119 Type 2 diabetes mellitus without complications: Secondary | ICD-10-CM | POA: Insufficient documentation

## 2014-05-26 DIAGNOSIS — R000 Tachycardia, unspecified: Secondary | ICD-10-CM | POA: Insufficient documentation

## 2014-05-26 DIAGNOSIS — R601 Generalized edema: Secondary | ICD-10-CM

## 2014-05-26 NOTE — Progress Notes (Signed)
2D Echo completed. 05/26/2014

## 2014-06-07 ENCOUNTER — Encounter: Payer: Medicare PPO | Attending: Family Medicine | Admitting: Nutrition

## 2014-06-07 DIAGNOSIS — E1165 Type 2 diabetes mellitus with hyperglycemia: Secondary | ICD-10-CM

## 2014-06-07 DIAGNOSIS — Z794 Long term (current) use of insulin: Secondary | ICD-10-CM | POA: Insufficient documentation

## 2014-06-07 DIAGNOSIS — Z713 Dietary counseling and surveillance: Secondary | ICD-10-CM | POA: Diagnosis not present

## 2014-06-07 DIAGNOSIS — E1129 Type 2 diabetes mellitus with other diabetic kidney complication: Secondary | ICD-10-CM | POA: Diagnosis not present

## 2014-06-07 DIAGNOSIS — IMO0002 Reserved for concepts with insufficient information to code with codable children: Secondary | ICD-10-CM

## 2014-06-07 NOTE — Progress Notes (Signed)
We discussed how the V-go works and why it might be a good option for her. She eats out 4 X/wk, and does not take her AM, or PM dose until she returns home from the restaurant. (sometimes 2hr. After the meal).   Her currrent dose of 75/25 is AM: 20u, and PM: 18u.   She was started on the V-go 20, because this is the smallest one, and she will take 2u for each meal  She eats a late breakfast most mornings,and will usually snack in the late afternoon to prevent her blood sugars from dropping low.  She does not want to snack, because she is gaining weight, and was told that she will probably not need to do this on the V-go.  We discussed the idea of balanced meals, and the need to have at least 2 servings of carbohydrates at each meal.  She was given a handout of carb servings and she had no questions about this.  She was instructed on how to fill, apply and use the V-Go and re demonstrated filling the V-go correctly, and applied it to her abdomen without any difficulty.  She did not take her AM dose of insulin this morning.   She was given a starter kit with 6 days of V-Gos in there, as well as directions for use and a toll free help line for questions.    She will give one button press (2u for every meal)--except when she will be active (yard work) after the meal.    We reviewed low blood sugars--symptoms and treatments, and she was given glucose tablets to use for this.  She was cautioned that this V-Go 20 may be too much basal insulin and she reported good understanding of this.    She was told to test her blood sugars before meals and at HS, and to call me the results in the AM.  She agreed to do this.  She had no final questions.    She will call customer care to see what this will cost her each month, and then decide if she will continue this after she finishes her samples.

## 2014-06-07 NOTE — Patient Instructions (Signed)
Fill and apply a V-go once a day--at the same time each day. Give 1 button press for before each meal. Test blood sugars before meals and at bedtime. Call [employer] Customer care to see what this treatment will cost.   Call me blood sugar readings in the AM

## 2014-06-08 ENCOUNTER — Telehealth: Payer: Self-pay | Admitting: Nutrition

## 2014-06-08 NOTE — Telephone Encounter (Signed)
FBS today was 76.  She was asymptomatic.  She ate oatmeal and her 2hr. pc reading with no bolus amount was 177.  She wanted a refresher on how to fill this V-Go and so, came in and did it while I watched her.  She did it well and we reviewed the need to no take any meal time insulin for today, and I will call her tomorrow to see how she is doing with this.  She had no final questions.

## 2014-06-08 NOTE — Telephone Encounter (Signed)
[place] reported that she had no difficulty wearing the V-go, but did not take any bolus insulins before lunch or supper,  because her blood sugar was 120 2hr. pcL and dropped to 74 1 hour after that.  She was asymtomatic, but drank 1/2 cup juice and before supper, it was 129.  After eating a sandwich and cole slaw, her blood sugar was 119 2hr. PcS.  She was told to eat a snack at [place], and that I will call her in the AM.  She was told to keep the glucose tablets at her bedside to use if blood sugars drop low.    I am not sure if she still had some NPH lingering from last night's injection.  I will call her in the AM.

## 2014-06-09 ENCOUNTER — Encounter: Payer: Self-pay | Admitting: Pulmonary Disease

## 2014-06-09 ENCOUNTER — Ambulatory Visit (INDEPENDENT_AMBULATORY_CARE_PROVIDER_SITE_OTHER): Payer: Commercial Managed Care - HMO | Admitting: Pulmonary Disease

## 2014-06-09 VITALS — BP 134/70 | HR 74 | Ht 64.0 in | Wt 173.0 lb

## 2014-06-09 DIAGNOSIS — I272 Pulmonary hypertension, unspecified: Secondary | ICD-10-CM | POA: Insufficient documentation

## 2014-06-09 DIAGNOSIS — I270 Primary pulmonary hypertension: Secondary | ICD-10-CM

## 2014-06-09 DIAGNOSIS — R0602 Shortness of breath: Secondary | ICD-10-CM

## 2014-06-09 NOTE — Progress Notes (Signed)
Subjective:    Patient ID: Kristen Bowman, female    DOB: 04-Jul-1931, 80 y.o.   MRN: [id number]  HPI Chief Complaint  Patient presents with  . Advice Only    Referred by Dr. Brigitte Pulse for [facility].  Pt has no breathing complaints at this time.     This is a very pleasant 80 year old female who comes to my clinic today for evaluation of pulmonary hypertension. She states that she has no past medical history significant for asthma or COPD or an other chronic lung disease. However, when she was an infant she had very severe whooping cough. She did not have significant symptoms after recovering from this illness. She lived a normal childhood and throughout her adult life she has not had significant problems with shortness of breath until recently.  She tells me that for less than one year she has had the occasional onset of chest tightness with associated shortness of breath. This is mild in intensity and does not limit her activity. She says that it will, on "randomly less than once a month" and will last for only a few minutes at a time. She has the subtle onset of symptoms and they also go away a similar manner. This is not associated with chest pain, coughing, or wheezing.  The shortness of breath has not triggered by any sort of particular environmental or emotional situation.  She has never been told in the past that she snores and to her knowledge she has no problem sleeping. She feels well rested when she wakes up in the mornings, and she does not have morning headaches. She has never smoked cigarettes.  She has gained weight in the last several years and she has been diagnosed with diabetes. She uses an insulin pump.   Past Medical History  Diagnosis Date  . Hypertension   . Diabetes mellitus     T2DM with renal manifestations  . Panic disorder   . Polymyalgia rheumatica   . Hematuria   . Type II or unspecified type diabetes mellitus with renal manifestations, uncontrolled 05/29/10    CKD II  (mild)  . Proteinuria 05/29/10  . Generalized osteoarthrosis, unspecified site   . Hyperlipidemia     mixed  . Irritable bowel syndrome   . Anxiety states   . Other specified cardiac dysrhythmias(427.89)     atrial fibrilation  . Anemia      Family History  Problem Relation Age of Onset  . Diabetes Sister   . Cancer Other   . Heart attack Other   . Emphysema Mother   . Leukemia Father      History   Social History  . Marital Status: Divorced    Spouse Name: N/A    Number of Children: N/A  . Years of Education: N/A   Occupational History  . Not on file.   Social History Main Topics  . Smoking status: Never Smoker   . Smokeless tobacco: Never Used  . Alcohol Use: No  . Drug Use: No  . Sexual Activity: Not on file   Other Topics Concern  . Not on file   Social History Narrative  . No narrative on file     Allergies  Allergen Reactions  . Ace Inhibitors Shortness Of Breath  . Lisinopril Shortness Of Breath     Outpatient Prescriptions Prior to Visit  Medication Sig Dispense Refill  . Cholecalciferol (VITAMIN D) 2000 UNITS tablet Take 2,000 Units by mouth daily.      Marland Kitchen  clonazePAM (KLONOPIN) 0.5 MG tablet Take 0.5 mg by mouth 2 (two) times daily. Takes 1/2 tab in a.m. and 1 tab at hs      . insulin aspart protamine-insulin aspart (NOVOLOG MIX 70/30 FLEXPEN) (70-30) 100 UNIT/ML injection Inject 16 Units into the skin 2 (two) times daily.      . insulin lispro protamine-insulin lispro (HUMALOG MIX 75/25 KWIKPEN) (75-25) 100 UNIT/ML SUSP Inject 6 Units into the skin 2 (two) times daily.       . metFORMIN (GLUCOPHAGE) 1000 MG tablet Take 1,000 mg by mouth 2 (two) times daily with a meal.       . metoprolol (TOPROL-XL) 100 MG 24 hr tablet Take 150 mg by mouth every evening.       . simvastatin (ZOCOR) 20 MG tablet Take 20 mg by mouth every evening.      Marland Kitchen amLODipine (NORVASC) 10 MG tablet Take 10 mg by mouth every morning.       . ferrous sulfate 325 (65 FE) MG  tablet Take 325 mg by mouth 2 (two) times daily.       No facility-administered medications prior to visit.      Review of Systems  Constitutional: Negative for fever and unexpected weight change.  HENT: Negative for congestion, dental problem, ear pain, nosebleeds, postnasal drip, rhinorrhea, sinus pressure, sneezing, sore throat and trouble swallowing.   Eyes: Negative for redness and itching.  Respiratory: Negative for cough, chest tightness, shortness of breath and wheezing.   Cardiovascular: Negative for palpitations and leg swelling.  Gastrointestinal: Negative for nausea and vomiting.  Genitourinary: Negative for dysuria.  Musculoskeletal: Negative for joint swelling.  Skin: Negative for rash.  Neurological: Positive for headaches.  Hematological: Does not bruise/bleed easily.  Psychiatric/Behavioral: Negative for dysphoric mood. The patient is not nervous/anxious.        Objective:   Physical Exam  Filed Vitals:   06/09/14 1622  BP: 134/70  Pulse: 74  Height: 5\' 4"  (1.626 m)  Weight: 173 lb (78.472 kg)  SpO2: 94%  RA  Gen: well appearing, no acute distress HEENT: NCAT, PERRL, EOMi, OP clear, neck supple without masses PULM: CTA B CV: RRR, slight systolic murmur, JVD dnote AB: BS+, soft, nontender, no hsm Ext: warm, trace pretibial edema, no clubbing, no cyanosis Derm: no rash or skin breakdown Neuro: A&Ox4, CN II-XII intact, strength 5/5 in all 4 extremities  05/26/2014 TTE> normal LVEF, RV normal size and function, RVSP 46mmHg     Assessment & Plan:   Pulmonary hypertension Kristen Bowman has been referred to my clinic for evaluation of pulmonary hypertension. This is a new diagnosis for her and has been made based on an echocardiogram alone. Fortunately she does not have symptoms with the exception of some very mild, episodic dyspnea which she has experienced in the last year. She has no prior history of lung disease or an underlying condition which is known to  be associated with pulmonary hypertension.  Today she and I discussed the broad differential diagnosis of pulmonary hypertension and we also discussed the importance of obtaining a right heart catheterization to confirm the diagnosis and assess the severity of the disease. When I brought up the issue of a right heart catheterization she became visibly anxious and very uncomfortable. Despite my best effort to explain to her that it is a low-risk procedure and we have skilled clinicians who are very comfortable performing the procedure, she in no way feels comfortable proceeding with that at this time.  I do share a concern with the patient's primary care physician that she may have pulmonary hypertension based on her echocardiogram results and her physical exam which clearly demonstrates elevated JVD and leg swelling.  Plan: -At this time we will test for conditions which can cause pulmonary hypertension and we will monitor for progression with serial 6 minute walk testing. -TSH -HIV -Serologic panel for lupus, scleroderma, and mixed connective tissue disease -6 minute walk now and again in 6 months -Pulmonary function test -Overnight oximetry -CT scan of the chest -Nuclear medicine perfusion and ventilation scan -Followup 6 months or sooner if she develops worsening shortness of breath.   Updated Medication List Outpatient Encounter Prescriptions as of 06/09/2014  Medication Sig  . Cholecalciferol (VITAMIN D) 2000 UNITS tablet Take 2,000 Units by mouth daily.  . clonazePAM (KLONOPIN) 0.5 MG tablet Take 0.5 mg by mouth 2 (two) times daily. Takes 1/2 tab in a.m. and 1 tab at hs  . insulin aspart protamine-insulin aspart (NOVOLOG MIX 70/30 FLEXPEN) (70-30) 100 UNIT/ML injection Inject 16 Units into the skin 2 (two) times daily.  . insulin lispro protamine-insulin lispro (HUMALOG MIX 75/25 KWIKPEN) (75-25) 100 UNIT/ML SUSP Inject 6 Units into the skin 2 (two) times daily.   . metFORMIN  (GLUCOPHAGE) 1000 MG tablet Take 1,000 mg by mouth 2 (two) times daily with a meal.   . metoprolol (TOPROL-XL) 100 MG 24 hr tablet Take 150 mg by mouth every evening.   . simvastatin (ZOCOR) 20 MG tablet Take 20 mg by mouth every evening.  . verapamil (CALAN-SR) 120 MG CR tablet Take 120 mg by mouth daily.  . [DISCONTINUED] amLODipine (NORVASC) 10 MG tablet Take 10 mg by mouth every morning.   . [DISCONTINUED] ferrous sulfate 325 (65 FE) MG tablet Take 325 mg by mouth 2 (two) times daily.

## 2014-06-09 NOTE — Patient Instructions (Signed)
We will arrange a lung function test, CT scan of your chest and a nuclear scan of your chest We will also arrange a 6 minute walk now and 6 months from now We will arrange a overnight oxygen test in your house We will see you back in 6 months or sooner if needed

## 2014-06-09 NOTE — Assessment & Plan Note (Signed)
[name] has been referred to my clinic for evaluation of pulmonary hypertension. This is a new diagnosis for her and has been made based on an echocardiogram alone. Fortunately she does not have symptoms with the exception of some very mild, episodic dyspnea which she has experienced in the last year. She has no prior history of lung disease or an underlying condition which is known to be associated with pulmonary hypertension.  Today she and I discussed the broad differential diagnosis of pulmonary hypertension and we also discussed the importance of obtaining a right heart catheterization to confirm the diagnosis and assess the severity of the disease. When I brought up the issue of a right heart catheterization she became visibly anxious and very uncomfortable. Despite my best effort to explain to her that it is a low-risk procedure and we have skilled clinicians who are very comfortable performing the procedure, she in no way feels comfortable proceeding with that at this time.  I do share a concern with the patient's primary care physician that she may have pulmonary hypertension based on her echocardiogram results and her physical exam which clearly demonstrates elevated JVD and leg swelling.  Plan: -At this time we will test for conditions which can cause pulmonary hypertension and we will monitor for progression with serial 6 minute walk testing. -TSH -HIV -Serologic panel for lupus, scleroderma, and mixed connective tissue disease -6 minute walk now and again in 6 months -Pulmonary function test -Overnight oximetry -CT scan of the chest -Nuclear medicine perfusion and ventilation scan -Followup 6 months or sooner if she develops worsening shortness of breath.

## 2014-06-10 ENCOUNTER — Other Ambulatory Visit: Payer: Commercial Managed Care - HMO

## 2014-06-10 ENCOUNTER — Telehealth: Payer: Self-pay | Admitting: Endocrinology

## 2014-06-10 DIAGNOSIS — I272 Pulmonary hypertension, unspecified: Secondary | ICD-10-CM

## 2014-06-10 DIAGNOSIS — R0602 Shortness of breath: Secondary | ICD-10-CM

## 2014-06-10 NOTE — Telephone Encounter (Signed)
Amy called from Dr. Brigitte Pulse office the patient is having issue with the V-Go if someone could contact her concerning this

## 2014-06-10 NOTE — Telephone Encounter (Signed)
She's not our patient, she's Dr. Manya Silvas, she needs to call the 1-800 # to get help,

## 2014-06-13 LAB — CENTROMERE ANTIBODIES: Centromere Ab Screen: <1

## 2014-06-13 NOTE — Telephone Encounter (Signed)
Message left on her machine to call me.  She was given my work number here and my cell number

## 2014-06-13 NOTE — Telephone Encounter (Signed)
Pt. Reported that she had difficulty on Friday with changing out her V-go.  Said she called up here and was told to call the 800 telephone number.  She called them on Friday and again on Saturday--each day having difficulty changing/filling the V-go, and one day (Friday when her blood sugars went high for no reason.    She says that she is not able to see if it is filling without any air bubbles.  She said that the help line told her that she was problably getting air instead of insulin.  They talked her through changing her V-go Friday, and her blood sugars came down.   She again had difficulty with filling the V-go on Saturday, and went back on injections.   She said that her FBS today was 140, and that they have been doing better the last 2 days.  She was encouraged to come in today for another review of this process, but refused this.   She was also told by [facility] customer care, that she is in the donut hole, and that this will cost her $220.00/month--something that she can not afford. She was told that if she wanted to go back on it the first of the year, to call me, and that I will help her with this.

## 2014-06-14 ENCOUNTER — Ambulatory Visit (INDEPENDENT_AMBULATORY_CARE_PROVIDER_SITE_OTHER)
Admission: RE | Admit: 2014-06-14 | Discharge: 2014-06-14 | Disposition: A | Discharge status: Home / Self Care | Payer: Commercial Managed Care - HMO | Source: Ambulatory Visit | Attending: Pulmonary Disease | Admitting: Pulmonary Disease

## 2014-06-14 DIAGNOSIS — R0602 Shortness of breath: Secondary | ICD-10-CM

## 2014-06-14 DIAGNOSIS — I272 Pulmonary hypertension, unspecified: Secondary | ICD-10-CM

## 2014-06-14 DIAGNOSIS — I270 Primary pulmonary hypertension: Secondary | ICD-10-CM

## 2014-06-15 ENCOUNTER — Ambulatory Visit (HOSPITAL_COMMUNITY): Payer: Medicare PPO

## 2014-06-15 ENCOUNTER — Ambulatory Visit (HOSPITAL_COMMUNITY)
Admission: RE | Admit: 2014-06-15 | Discharge: 2014-06-15 | Disposition: A | Discharge status: Home / Self Care | Payer: Medicare PPO | Source: Ambulatory Visit | Attending: Pulmonary Disease | Admitting: Pulmonary Disease

## 2014-06-15 ENCOUNTER — Encounter: Payer: Self-pay | Admitting: Pulmonary Disease

## 2014-06-15 DIAGNOSIS — I272 Other secondary pulmonary hypertension: Secondary | ICD-10-CM | POA: Insufficient documentation

## 2014-06-15 DIAGNOSIS — R0602 Shortness of breath: Secondary | ICD-10-CM

## 2014-06-15 MED ORDER — TECHNETIUM TO 99M ALBUMIN AGGREGATED
5.1000 | Freq: Once | INTRAVENOUS | Status: AC | PRN
  Administered 2014-06-15: 5 via INTRAVENOUS

## 2014-06-15 MED ORDER — TECHNETIUM TC 99M DIETHYLENETRIAME-PENTAACETIC ACID: 42.0000 | Freq: Once | INTRAVENOUS | Status: AC | PRN

## 2014-06-15 NOTE — Progress Notes (Signed)
Quick Note:  lmtcb ______ 

## 2014-06-24 NOTE — Progress Notes (Signed)
Quick Note:  lmtcb ______ 

## 2014-06-28 ENCOUNTER — Ambulatory Visit: Payer: Commercial Managed Care - HMO

## 2014-07-03 ENCOUNTER — Encounter: Payer: Self-pay | Admitting: *Deleted

## 2014-07-05 ENCOUNTER — Telehealth: Payer: Self-pay

## 2014-07-05 NOTE — Telephone Encounter (Signed)
Pt aware of results.  Nothing further needed.  

## 2014-07-05 NOTE — Telephone Encounter (Signed)
-----   Message from Juanito Doom, MD sent at 07/05/2014  8:05 AM EST ----- A, Please let her know that her ONO on RA was normal Thanks B

## 2014-07-05 NOTE — Telephone Encounter (Signed)
lmtcb X1 to relay results. 

## 2014-07-05 NOTE — Telephone Encounter (Signed)
[place]

## 2014-07-06 ENCOUNTER — Other Ambulatory Visit: Payer: Self-pay | Admitting: Family Medicine

## 2015-07-04 ENCOUNTER — Other Ambulatory Visit: Payer: Self-pay | Admitting: Ophthalmology

## 2016-08-25 ENCOUNTER — Emergency Department (HOSPITAL_COMMUNITY)
Admission: EM | Admit: 2016-08-25 | Discharge: 2016-08-25 | Disposition: A | Discharge status: Home / Self Care | Payer: Medicare Other | Attending: Emergency Medicine | Admitting: Emergency Medicine

## 2016-08-25 ENCOUNTER — Encounter (HOSPITAL_COMMUNITY): Payer: Self-pay

## 2016-08-25 DIAGNOSIS — E1122 Type 2 diabetes mellitus with diabetic chronic kidney disease: Secondary | ICD-10-CM | POA: Diagnosis not present

## 2016-08-25 DIAGNOSIS — I129 Hypertensive chronic kidney disease with stage 1 through stage 4 chronic kidney disease, or unspecified chronic kidney disease: Secondary | ICD-10-CM | POA: Diagnosis not present

## 2016-08-25 DIAGNOSIS — N289 Disorder of kidney and ureter, unspecified: Secondary | ICD-10-CM

## 2016-08-25 DIAGNOSIS — Z79899 Other long term (current) drug therapy: Secondary | ICD-10-CM | POA: Diagnosis not present

## 2016-08-25 DIAGNOSIS — Z794 Long term (current) use of insulin: Secondary | ICD-10-CM | POA: Diagnosis not present

## 2016-08-25 DIAGNOSIS — E11649 Type 2 diabetes mellitus with hypoglycemia without coma: Secondary | ICD-10-CM | POA: Insufficient documentation

## 2016-08-25 DIAGNOSIS — E162 Hypoglycemia, unspecified: Secondary | ICD-10-CM

## 2016-08-25 DIAGNOSIS — N182 Chronic kidney disease, stage 2 (mild): Secondary | ICD-10-CM | POA: Insufficient documentation

## 2016-08-25 LAB — CBC
HEMATOCRIT: 40.4 % (ref 36.0–46.0)
Hemoglobin: 13.7 g/dL (ref 12.0–15.0)
MCH: 29.7 pg (ref 26.0–34.0)
MCHC: 33.9 g/dL (ref 30.0–36.0)
MCV: 87.4 fL (ref 78.0–100.0)
Platelets: 178 10*3/uL (ref 150–400)
RBC: 4.62 MIL/uL (ref 3.87–5.11)
RDW: 13.6 % (ref 11.5–15.5)
WBC: 6.3 10*3/uL (ref 4.0–10.5)

## 2016-08-25 LAB — CBG MONITORING, ED
GLUCOSE-CAPILLARY: 262 mg/dL — AB (ref 65–99)
Glucose-Capillary: 139 mg/dL — ABNORMAL HIGH (ref 65–99)

## 2016-08-25 LAB — I-STAT TROPONIN, ED: Troponin i, poc: 0 ng/mL (ref 0.00–0.08)

## 2016-08-25 LAB — BASIC METABOLIC PANEL
ANION GAP: 8 (ref 5–15)
BUN: 19 mg/dL (ref 6–20)
CALCIUM: 9.2 mg/dL (ref 8.9–10.3)
CO2: 26 mmol/L (ref 22–32)
Chloride: 105 mmol/L (ref 101–111)
Creatinine, Ser: 1.18 mg/dL — ABNORMAL HIGH (ref 0.44–1.00)
GFR calc Af Amer: 48 mL/min — ABNORMAL LOW (ref >60)
GFR calc non Af Amer: 41 mL/min — ABNORMAL LOW (ref >60)
GLUCOSE: 155 mg/dL — AB (ref 65–99)
Potassium: 3.5 mmol/L (ref 3.5–5.1)
Sodium: 139 mmol/L (ref 135–145)

## 2016-08-25 NOTE — ED Triage Notes (Signed)
Patient reports that she developed hypoglycemia last night. States that she took her normal insulin and ate regular. Also complains of chest pressure, patient thinks she is nervous. No insulin used today. Alert and oriented

## 2016-08-25 NOTE — Discharge Instructions (Signed)
We need to discuss with your endocrinologist on Tuesday whether to continue your current insulin regimen or dropped back down to what it was before. Make sure that you check your glucose frequently.  Your kidney function was slightly higher than it has been in the past. This needs to be followed by her primary care provider.

## 2016-08-25 NOTE — ED Provider Notes (Signed)
[place] DEPT Provider Note   CSN: [id number] Arrival date & time: 08/25/16  1110     History   Chief Complaint Chief Complaint  Patient presents with  . Chest Pain/hypoglycemia    HPI Kristen Bowman is a 82 y.o. female.  Patient presents with hypoglycemia. She has a history of diabetes, hyperlipidemia and anxiety. She takes an oral hypoglycemic agent which is listed as metformin but she thinks that she recently switched to a different one. She also takes Humalog, 75/25 flex pen.  She takes 14 units in the morning and she was taking 20 units at night but about a week ago she switched to 24 units at night. She states that last night she did take it around supper time and later that night noticed her blood sugar was in the 60s. She drink a little orange juice and in the morning noticed it was in the 40s. She had a little bit of dizziness but otherwise wasn't feeling bad. She denies any other recent illnesses. No other current symptoms. She reported to the triage nurse that she had some chest pain but she states that she's not having chest pain. She feels occasionally fluttery in the chest but she feels this is due to her anxiety and is unchanged from her baseline. She hasn't had any recent episodes of that.      Past Medical History:  Diagnosis Date  . Anemia   . Anxiety states   . Diabetes mellitus    T2DM with renal manifestations  . Generalized osteoarthrosis, unspecified site   . Hematuria   . Hyperlipidemia    mixed  . Hypertension   . Irritable bowel syndrome   . Other specified cardiac dysrhythmias(427.89)    atrial fibrilation  . Panic disorder   . Polymyalgia rheumatica ([place])   . Proteinuria 05/29/10  . Type II or unspecified type diabetes mellitus with renal manifestations, uncontrolled(250.42) 05/29/10   CKD II (mild)    Patient Active Problem List   Diagnosis Date Noted  . Pulmonary hypertension 06/09/2014  . Tachycardia 09/24/2012  . Palpitations  09/24/2012  . CKD (chronic kidney disease), stage II 09/24/2012  . Anemia, iron deficiency 09/24/2012  . Hypertension   . Panic disorder   . Hyperlipidemia   . Type II or unspecified type diabetes mellitus with renal manifestations, uncontrolled(250.42) 05/29/2010    Past Surgical History:  Procedure Laterality Date  . CATARACT EXTRACTION     right  . COLONOSCOPY N/A 11/04/2012   Procedure: COLONOSCOPY;  Surgeon: Arta Silence, MD;  Location: [facility] ENDOSCOPY;  Service: Endoscopy;  Laterality: N/A;  . ESOPHAGOGASTRODUODENOSCOPY N/A 11/04/2012   Procedure: ESOPHAGOGASTRODUODENOSCOPY (EGD);  Surgeon: Arta Silence, MD;  Location: [facility];  Service: Endoscopy;  Laterality: N/A;  . TUBAL LIGATION      OB History    No data available       Home Medications    Prior to Admission medications   Medication Sig Start Date End Date Taking? Authorizing Provider  Cholecalciferol (VITAMIN D) 2000 UNITS tablet Take 2,000 Units by mouth daily.   Yes Historical Provider, MD  clonazePAM (KLONOPIN) 0.5 MG tablet Take 0.5 mg by mouth 2 (two) times daily. Takes 1/2 tab in a.m. and 1 tab at hs   Yes Historical Provider, MD  hydrochlorothiazide (HYDRODIURIL) 25 MG tablet Take 25 mg by mouth daily. 07/31/16  Yes Historical Provider, MD  insulin lispro protamine-insulin lispro (HUMALOG MIX 75/25 KWIKPEN) (75-25) 100 UNIT/ML SUSP Inject 14-21 Units into the skin  2 (two) times daily.    Yes Historical Provider, MD  losartan (COZAAR) 100 MG tablet Take 25 mg by mouth daily. 08/08/16  Yes Historical Provider, MD  metFORMIN (GLUCOPHAGE-XR) 500 MG 24 hr tablet Take 2,000 mg by mouth daily. 08/14/16  Yes Historical Provider, MD  metoprolol succinate (TOPROL-XL) 100 MG 24 hr tablet Take 200 mg by mouth daily. Take with or immediately following a meal.   Yes Historical Provider, MD  simvastatin (ZOCOR) 20 MG tablet Take 20 mg by mouth every evening.   Yes Historical Provider, MD  verapamil (CALAN-SR) 120 MG CR  tablet Take 120 mg by mouth daily.   Yes Historical Provider, MD  insulin aspart protamine-insulin aspart (NOVOLOG MIX 70/30 FLEXPEN) (70-30) 100 UNIT/ML injection Inject 16 Units into the skin 2 (two) times daily.    Historical Provider, MD  metoprolol (TOPROL-XL) 100 MG 24 hr tablet Take 150 mg by mouth every evening.     Historical Provider, MD    Family History Family History  Problem Relation Age of Onset  . Diabetes Sister   . Cancer Other   . Heart attack Other   . Emphysema Mother   . Leukemia Father     Social History Social History  Substance Use Topics  . Smoking status: Never Smoker  . Smokeless tobacco: Never Used  . Alcohol use No     Allergies   Ace inhibitors and Lisinopril   Review of Systems Review of Systems  Constitutional: Negative for chills, diaphoresis, fatigue and fever.  HENT: Negative for congestion, rhinorrhea and sneezing.   Eyes: Negative.   Respiratory: Negative for cough, chest tightness and shortness of breath.   Cardiovascular: Positive for palpitations. Negative for chest pain and leg swelling.  Gastrointestinal: Negative for abdominal pain, blood in stool, diarrhea, nausea and vomiting.  Genitourinary: Negative for difficulty urinating, flank pain, frequency and hematuria.  Musculoskeletal: Negative for arthralgias and back pain.  Skin: Negative for rash.  Neurological: Positive for light-headedness. Negative for dizziness, speech difficulty, weakness, numbness and headaches.     Physical Exam Updated Vital Signs BP 157/75   Pulse (!) 43   Temp 97.7 F (36.5 C) (Oral)   Resp 15   SpO2 100%   Physical Exam  Constitutional: She is oriented to person, place, and time. She appears well-developed and well-nourished.  HENT:  Head: Normocephalic and atraumatic.  Eyes: Pupils are equal, round, and reactive to light.  Neck: Normal range of motion. Neck supple.  Cardiovascular: Normal rate, regular rhythm and normal heart sounds.     Pulmonary/Chest: Effort normal and breath sounds normal. No respiratory distress. She has no wheezes. She has no rales. She exhibits no tenderness.  Abdominal: Soft. Bowel sounds are normal. There is no tenderness. There is no rebound and no guarding.  Musculoskeletal: Normal range of motion. She exhibits no edema.  Lymphadenopathy:    She has no cervical adenopathy.  Neurological: She is alert and oriented to person, place, and time.  Skin: Skin is warm and dry. No rash noted.  Psychiatric: She has a normal mood and affect.     ED Treatments / Results  Labs (all labs ordered are listed, but only abnormal results are displayed) Labs Reviewed  BASIC METABOLIC PANEL - Abnormal; Notable for the following:       Result Value   Glucose, Bld 155 (*)    Creatinine, Ser 1.18 (*)    GFR calc non Af Amer 41 (*)    GFR calc  Af Amer 48 (*)    All other components within normal limits  CBG MONITORING, ED - Abnormal; Notable for the following:    Glucose-Capillary 139 (*)    All other components within normal limits  CBG MONITORING, ED - Abnormal; Notable for the following:    Glucose-Capillary 262 (*)    All other components within normal limits  CBC  I-STAT TROPOININ, ED    EKG  EKG Interpretation  Date/Time:  Sunday August 25 2016 11:31:58 EST Ventricular Rate:  98 PR Interval:  186 QRS Duration: 92 QT Interval:  386 QTC Calculation: 492 R Axis:   68 Text Interpretation:  Sinus rhythm with frequent Premature ventricular complexes Prolonged QT Abnormal ECG since last tracing no significant change Confirmed by Wendelyn Kiesling  MD, Brownie Gockel ([id number]) on 08/25/2016 1:59:13 PM       Radiology No results found.  Procedures Procedures (including critical care time)  Medications Ordered in ED Medications - No data to display   Initial Impression / Assessment and Plan / ED Course  I have reviewed the triage vital signs and the nursing notes.  Pertinent labs & imaging results that were  available during my care of the patient were reviewed by me and considered in my medical decision making (see chart for details).  Clinical Course     Patient presents with hypoglycemia. Her sugar has remained stable in the ED. This been no evidence of hypoglycemia. She has some PVCs on EKG but she is not presenting with any current cardiac complaints. Her labs are non-concerning. Her creatinine is slightly out of the normal range. I advised her to discuss with her endocrinologist on Tuesday whether to continue her current insulin regimen or dropped back down to her lower dose. I advised her to check her blood sugars frequently at home. Also advised her that her creatinine is slightly elevated and needs to be rechecked by her PCP. Pressure is elevated but she has not taken her medications today.  Final Clinical Impressions(s) / ED Diagnoses   Final diagnoses:  Hypoglycemia  Renal insufficiency    New Prescriptions New Prescriptions   No medications on file     Malvin Johns, MD 08/25/16 1529

## 2016-08-25 NOTE — ED Notes (Signed)
  CBG 262

## 2016-09-09 DIAGNOSIS — E1122 Type 2 diabetes mellitus with diabetic chronic kidney disease: Secondary | ICD-10-CM | POA: Diagnosis not present

## 2016-09-09 DIAGNOSIS — F322 Major depressive disorder, single episode, severe without psychotic features: Secondary | ICD-10-CM | POA: Diagnosis not present

## 2016-09-09 DIAGNOSIS — I1310 Hypertensive heart and chronic kidney disease without heart failure, with stage 1 through stage 4 chronic kidney disease, or unspecified chronic kidney disease: Secondary | ICD-10-CM | POA: Diagnosis not present

## 2016-09-09 DIAGNOSIS — E782 Mixed hyperlipidemia: Secondary | ICD-10-CM | POA: Diagnosis not present

## 2016-09-09 DIAGNOSIS — N182 Chronic kidney disease, stage 2 (mild): Secondary | ICD-10-CM | POA: Diagnosis not present

## 2016-09-09 DIAGNOSIS — F411 Generalized anxiety disorder: Secondary | ICD-10-CM | POA: Diagnosis not present

## 2016-11-11 DIAGNOSIS — Z794 Long term (current) use of insulin: Secondary | ICD-10-CM | POA: Diagnosis not present

## 2016-11-11 DIAGNOSIS — E1165 Type 2 diabetes mellitus with hyperglycemia: Secondary | ICD-10-CM | POA: Diagnosis not present

## 2017-01-24 DIAGNOSIS — E1165 Type 2 diabetes mellitus with hyperglycemia: Secondary | ICD-10-CM | POA: Diagnosis not present

## 2017-01-24 DIAGNOSIS — Z794 Long term (current) use of insulin: Secondary | ICD-10-CM | POA: Diagnosis not present

## 2017-02-25 DIAGNOSIS — E119 Type 2 diabetes mellitus without complications: Secondary | ICD-10-CM | POA: Diagnosis not present

## 2017-02-25 DIAGNOSIS — E1165 Type 2 diabetes mellitus with hyperglycemia: Secondary | ICD-10-CM | POA: Diagnosis not present

## 2017-02-25 DIAGNOSIS — Z794 Long term (current) use of insulin: Secondary | ICD-10-CM | POA: Diagnosis not present

## 2017-03-19 DIAGNOSIS — K589 Irritable bowel syndrome without diarrhea: Secondary | ICD-10-CM | POA: Diagnosis not present

## 2017-03-19 DIAGNOSIS — F322 Major depressive disorder, single episode, severe without psychotic features: Secondary | ICD-10-CM | POA: Diagnosis not present

## 2017-03-19 DIAGNOSIS — E782 Mixed hyperlipidemia: Secondary | ICD-10-CM | POA: Diagnosis not present

## 2017-03-19 DIAGNOSIS — M7989 Other specified soft tissue disorders: Secondary | ICD-10-CM | POA: Diagnosis not present

## 2017-03-19 DIAGNOSIS — I1310 Hypertensive heart and chronic kidney disease without heart failure, with stage 1 through stage 4 chronic kidney disease, or unspecified chronic kidney disease: Secondary | ICD-10-CM | POA: Diagnosis not present

## 2017-03-19 DIAGNOSIS — N182 Chronic kidney disease, stage 2 (mild): Secondary | ICD-10-CM | POA: Diagnosis not present

## 2017-03-19 DIAGNOSIS — L989 Disorder of the skin and subcutaneous tissue, unspecified: Secondary | ICD-10-CM | POA: Diagnosis not present

## 2017-03-19 DIAGNOSIS — I517 Cardiomegaly: Secondary | ICD-10-CM | POA: Diagnosis not present

## 2017-03-19 DIAGNOSIS — Z0000 Encounter for general adult medical examination without abnormal findings: Secondary | ICD-10-CM | POA: Diagnosis not present

## 2017-03-19 DIAGNOSIS — E1122 Type 2 diabetes mellitus with diabetic chronic kidney disease: Secondary | ICD-10-CM | POA: Diagnosis not present

## 2017-03-19 DIAGNOSIS — D509 Iron deficiency anemia, unspecified: Secondary | ICD-10-CM | POA: Diagnosis not present

## 2017-03-19 DIAGNOSIS — I272 Pulmonary hypertension, unspecified: Secondary | ICD-10-CM | POA: Diagnosis not present

## 2017-03-20 DIAGNOSIS — C4441 Basal cell carcinoma of skin of scalp and neck: Secondary | ICD-10-CM | POA: Diagnosis not present

## 2017-04-06 ENCOUNTER — Encounter (HOSPITAL_COMMUNITY): Payer: Self-pay | Admitting: Emergency Medicine

## 2017-04-06 ENCOUNTER — Emergency Department (HOSPITAL_COMMUNITY)
Admission: EM | Admit: 2017-04-06 | Discharge: 2017-04-06 | Disposition: A | Discharge status: Home / Self Care | Payer: PPO | Attending: Emergency Medicine | Admitting: Emergency Medicine

## 2017-04-06 DIAGNOSIS — I129 Hypertensive chronic kidney disease with stage 1 through stage 4 chronic kidney disease, or unspecified chronic kidney disease: Secondary | ICD-10-CM | POA: Insufficient documentation

## 2017-04-06 DIAGNOSIS — Z79899 Other long term (current) drug therapy: Secondary | ICD-10-CM | POA: Diagnosis not present

## 2017-04-06 DIAGNOSIS — E11649 Type 2 diabetes mellitus with hypoglycemia without coma: Secondary | ICD-10-CM | POA: Diagnosis not present

## 2017-04-06 DIAGNOSIS — E162 Hypoglycemia, unspecified: Secondary | ICD-10-CM

## 2017-04-06 DIAGNOSIS — Z794 Long term (current) use of insulin: Secondary | ICD-10-CM | POA: Insufficient documentation

## 2017-04-06 DIAGNOSIS — N182 Chronic kidney disease, stage 2 (mild): Secondary | ICD-10-CM | POA: Insufficient documentation

## 2017-04-06 LAB — CBC WITH DIFFERENTIAL/PLATELET
Basophils Absolute: 0 10*3/uL (ref 0.0–0.1)
Basophils Relative: 0 %
EOS PCT: 1 %
Eosinophils Absolute: 0.1 10*3/uL (ref 0.0–0.7)
HCT: 38.3 % (ref 36.0–46.0)
Hemoglobin: 12.4 g/dL (ref 12.0–15.0)
LYMPHS ABS: 1.4 10*3/uL (ref 0.7–4.0)
LYMPHS PCT: 18 %
MCH: 28.2 pg (ref 26.0–34.0)
MCHC: 32.4 g/dL (ref 30.0–36.0)
MCV: 87 fL (ref 78.0–100.0)
MONO ABS: 0.3 10*3/uL (ref 0.1–1.0)
MONOS PCT: 4 %
NEUTROS ABS: 6.2 10*3/uL (ref 1.7–7.7)
Neutrophils Relative %: 77 %
Platelets: 206 10*3/uL (ref 150–400)
RBC: 4.4 MIL/uL (ref 3.87–5.11)
RDW: 14.5 % (ref 11.5–15.5)
WBC: 8 10*3/uL (ref 4.0–10.5)

## 2017-04-06 LAB — BASIC METABOLIC PANEL
ANION GAP: 9 (ref 5–15)
BUN: 15 mg/dL (ref 6–20)
CO2: 25 mmol/L (ref 22–32)
Calcium: 9.2 mg/dL (ref 8.9–10.3)
Chloride: 109 mmol/L (ref 101–111)
Creatinine, Ser: 1.1 mg/dL — ABNORMAL HIGH (ref 0.44–1.00)
GFR calc Af Amer: 52 mL/min — ABNORMAL LOW (ref >60)
GFR calc non Af Amer: 45 mL/min — ABNORMAL LOW (ref >60)
GLUCOSE: 80 mg/dL (ref 65–99)
POTASSIUM: 4 mmol/L (ref 3.5–5.1)
Sodium: 143 mmol/L (ref 135–145)

## 2017-04-06 LAB — URINALYSIS, ROUTINE W REFLEX MICROSCOPIC
BILIRUBIN URINE: NEGATIVE
GLUCOSE, UA: NEGATIVE mg/dL
HGB URINE DIPSTICK: NEGATIVE
KETONES UR: NEGATIVE mg/dL
LEUKOCYTES UA: NEGATIVE
Nitrite: NEGATIVE
PH: 5 (ref 5.0–8.0)
Protein, ur: NEGATIVE mg/dL
Specific Gravity, Urine: 1.004 — ABNORMAL LOW (ref 1.005–1.030)

## 2017-04-06 LAB — CBG MONITORING, ED
GLUCOSE-CAPILLARY: 115 mg/dL — AB (ref 65–99)
GLUCOSE-CAPILLARY: 67 mg/dL (ref 65–99)
GLUCOSE-CAPILLARY: 104 mg/dL — AB (ref 65–99)
GLUCOSE-CAPILLARY: 156 mg/dL — AB (ref 65–99)
Glucose-Capillary: 52 mg/dL — ABNORMAL LOW (ref 65–99)
Glucose-Capillary: 68 mg/dL (ref 65–99)
Glucose-Capillary: 220 mg/dL — ABNORMAL HIGH (ref 65–99)

## 2017-04-06 NOTE — Discharge Instructions (Signed)
Make sure to eat all your meals today. Monitor your blood sugar - return if you are having any problems.

## 2017-04-06 NOTE — ED Provider Notes (Signed)
[facility] DEPT Provider Note   CSN: [id number] Arrival date & time: 04/06/17  0217     History   Chief Complaint Chief Complaint  Patient presents with  . Hypoglycemia    HPI Kristen Bowman is a 83 y.o. female.  The history is provided by the patient.  She checked her blood glucose at home before going to bed, and it was 67. She observed it and rechecked it, and it gone down to 57. She drank some orange juice and took some glucose tablets, but they look this did not come up so she came to the ED. She did not have any symptoms of sweating, palpitations, hunger. This is very unusual for her. She had eaten a normal dinner at about 6:30 PM.  Past Medical History:  Diagnosis Date  . Anemia   . Anxiety states   . Diabetes mellitus    T2DM with renal manifestations  . Generalized osteoarthrosis, unspecified site   . Hematuria   . Hyperlipidemia    mixed  . Hypertension   . Irritable bowel syndrome   . Other specified cardiac dysrhythmias(427.89)    atrial fibrilation  . Panic disorder   . Polymyalgia rheumatica ([place])   . Proteinuria 05/29/10  . Type II or unspecified type diabetes mellitus with renal manifestations, uncontrolled(250.42) 05/29/10   CKD II (mild)    Patient Active Problem List   Diagnosis Date Noted  . Pulmonary hypertension ([place]) 06/09/2014  . Tachycardia 09/24/2012  . Palpitations 09/24/2012  . CKD (chronic kidney disease), stage II 09/24/2012  . Anemia, iron deficiency 09/24/2012  . Hypertension   . Panic disorder   . Hyperlipidemia   . Type II or unspecified type diabetes mellitus with renal manifestations, uncontrolled(250.42) 05/29/2010    Past Surgical History:  Procedure Laterality Date  . CATARACT EXTRACTION     right  . COLONOSCOPY N/A 11/04/2012   Procedure: COLONOSCOPY;  Surgeon: Arta Silence, MD;  Location: [facility]COPY;  Service: Endoscopy;  Laterality: N/A;  . ESOPHAGOGASTRODUODENOSCOPY N/A 11/04/2012   Procedure:  ESOPHAGOGASTRODUODENOSCOPY (EGD);  Surgeon: Arta Silence, MD;  Location: [facility];  Service: Endoscopy;  Laterality: N/A;  . TUBAL LIGATION      OB History    No data available       Home Medications    Prior to Admission medications   Medication Sig Start Date End Date Taking? Authorizing Provider  Cholecalciferol (VITAMIN D) 2000 UNITS tablet Take 2,000 Units by mouth daily.    [provider]  clonazePAM (KLONOPIN) 0.5 MG tablet Take 0.5 mg by mouth 2 (two) times daily. Takes 1/2 tab in a.m. and 1 tab at hs    [provider]  hydrochlorothiazide (HYDRODIURIL) 25 MG tablet Take 25 mg by mouth daily. 07/31/16   [provider]  insulin aspart protamine-insulin aspart (NOVOLOG MIX 70/30 FLEXPEN) (70-30) 100 UNIT/ML injection Inject 16 Units into the skin 2 (two) times daily.    [provider]  insulin lispro protamine-insulin lispro (HUMALOG MIX 75/25 KWIKPEN) (75-25) 100 UNIT/ML SUSP Inject 14-21 Units into the skin 2 (two) times daily.     [provider]  losartan (COZAAR) 100 MG tablet Take 25 mg by mouth daily. 08/08/16   [provider]  metFORMIN (GLUCOPHAGE-XR) 500 MG 24 hr tablet Take 2,000 mg by mouth daily. 08/14/16   [provider]  metoprolol (TOPROL-XL) 100 MG 24 hr tablet Take 150 mg by mouth every evening.     [provider]  metoprolol  succinate (TOPROL-XL) 100 MG 24 hr tablet Take 200 mg by mouth daily. Take with or immediately following a meal.    [provider]  simvastatin (ZOCOR) 20 MG tablet Take 20 mg by mouth every evening.    [provider]  verapamil (CALAN-SR) 120 MG CR tablet Take 120 mg by mouth daily.    [provider]    Family History Family History  Problem Relation Age of Onset  . Diabetes Sister   . Cancer Other   . Heart attack Other   . Emphysema Mother   . Leukemia Father     Social History Social History  Substance Use Topics  .  Smoking status: Never Smoker  . Smokeless tobacco: Never Used  . Alcohol use No     Allergies   Ace inhibitors and Lisinopril   Review of Systems Review of Systems  All other systems reviewed and are negative.    Physical Exam Updated Vital Signs BP (!) 148/57 (BP Location: Right Arm)   Pulse 77   Temp 98.2 F (36.8 C) (Oral)   Resp 18   Ht 5\' 5"  (1.651 m)   Wt 77.1 kg (170 lb)   SpO2 96%   BMI 28.29 kg/m   Physical Exam  Nursing note and vitals reviewed.  83 year old female, resting comfortably and in no acute distress. Vital signs are significant for hypertension. Oxygen saturation is 96%, which is normal. Head is normocephalic and atraumatic. PERRLA, EOMI. Oropharynx is clear. Neck is nontender and supple without adenopathy or JVD. Back is nontender and there is no CVA tenderness. Lungs are clear without rales, wheezes, or rhonchi. Chest is nontender. Heart has regular rate and rhythm without murmur. Abdomen is soft, flat, nontender without masses or hepatosplenomegaly and peristalsis is normoactive. Extremities have 1+ edema, full range of motion is present. Skin is warm and dry without rash. Neurologic: Mental status is normal, cranial nerves are intact, there are no motor or sensory deficits.  ED Treatments / Results  Labs (all labs ordered are listed, but only abnormal results are displayed) Labs Reviewed  BASIC METABOLIC PANEL - Abnormal; Notable for the following:       Result Value   Creatinine, Ser 1.10 (*)    GFR calc non Af Amer 45 (*)    GFR calc Af Amer 52 (*)    All other components within normal limits  URINALYSIS, ROUTINE W REFLEX MICROSCOPIC - Abnormal; Notable for the following:    Color, Urine STRAW (*)    Specific Gravity, Urine 1.004 (*)    All other components within normal limits  CBG MONITORING, ED - Abnormal; Notable for the following:    Glucose-Capillary 52 (*)    All other components within normal limits  CBG MONITORING, ED -  Abnormal; Notable for the following:    Glucose-Capillary 115 (*)    All other components within normal limits  CBG MONITORING, ED - Abnormal; Notable for the following:    Glucose-Capillary 104 (*)    All other components within normal limits  CBG MONITORING, ED - Abnormal; Notable for the following:    Glucose-Capillary 156 (*)    All other components within normal limits  CBG MONITORING, ED - Abnormal; Notable for the following:    Glucose-Capillary 220 (*)    All other components within normal limits  CBC WITH DIFFERENTIAL/PLATELET  CBG MONITORING, ED  CBG MONITORING, ED   Procedures Procedures (including critical care time)  Medications Ordered in ED  Medications - No data to display   Initial Impression / Assessment and Plan / ED Course  I have reviewed the triage vital signs and the nursing notes.  Pertinent labs & imaging results that were available during my care of the patient were reviewed by me and considered in my medical decision making (see chart for details).  Hypoglycemia that has not responded to eating at home. In the ED, initial glucose was 52, went up to 115 with food, but has fallen back down to 68. She is being given additional food. Screening labs will be obtained. Old records are reviewed, and she has no relevant past visits. Review of her medications shows she is not on any sulfonylureas. Reason why her hypoglycemia has been refractory tonight is not clear.  Glucose did come up following additional female in the ED. And came up to 104, and she was observed to make sure his it would not come back down. Follow-up glucoses were 156, and 220. It was felt that she is now safe to go home. She is to maintain her current dose of insulin unless she has additional episodes of hypoglycemia. She is encouraged to make sure she eats 4 meals today, and monitor her glucose carefully for the next 24-48 hours. Return should refractory hypoglycemia recur.  Final Clinical  Impressions(s) / ED Diagnoses   Final diagnoses:  Hypoglycemia    New Prescriptions New Prescriptions   No medications on file     Delora Fuel, MD 64/33/29 [phone]

## 2017-04-06 NOTE — ED Notes (Signed)
Pt's blood sugar-67.

## 2017-04-06 NOTE — ED Triage Notes (Signed)
Pt states her blood sugar at home has been decreasing. Most recent reading was 57. Pt states she has not taken more insulin than usual, she has also not tried to eat anything to improve blood sugar level. PT's blood sugar in triage-52. Given graham crackers and juice. A&O x 4.

## 2017-04-06 NOTE — ED Notes (Signed)
Provided [place] sandwich and and orange juice. MD at bedside

## 2017-04-17 DIAGNOSIS — Z85828 Personal history of other malignant neoplasm of skin: Secondary | ICD-10-CM | POA: Diagnosis not present

## 2017-04-17 DIAGNOSIS — C44319 Basal cell carcinoma of skin of other parts of face: Secondary | ICD-10-CM | POA: Diagnosis not present

## 2017-06-02 DIAGNOSIS — Z794 Long term (current) use of insulin: Secondary | ICD-10-CM | POA: Diagnosis not present

## 2017-06-02 DIAGNOSIS — E1165 Type 2 diabetes mellitus with hyperglycemia: Secondary | ICD-10-CM | POA: Diagnosis not present

## 2017-12-23 ENCOUNTER — Other Ambulatory Visit: Payer: Self-pay | Admitting: Internal Medicine

## 2017-12-23 DIAGNOSIS — R1900 Intra-abdominal and pelvic swelling, mass and lump, unspecified site: Secondary | ICD-10-CM

## 2018-01-02 ENCOUNTER — Ambulatory Visit
Admission: RE | Admit: 2018-01-02 | Discharge: 2018-01-02 | Disposition: A | Discharge status: Home / Self Care | Payer: Medicare Other | Source: Ambulatory Visit | Attending: Internal Medicine | Admitting: Internal Medicine

## 2018-01-02 DIAGNOSIS — R1900 Intra-abdominal and pelvic swelling, mass and lump, unspecified site: Secondary | ICD-10-CM

## 2019-10-16 ENCOUNTER — Other Ambulatory Visit: Payer: Self-pay

## 2019-10-16 ENCOUNTER — Emergency Department (HOSPITAL_COMMUNITY)
Admission: EM | Admit: 2019-10-16 | Discharge: 2019-10-21 | Disposition: A | Discharge status: Home / Self Care | Payer: Medicare Other | Attending: Emergency Medicine | Admitting: Emergency Medicine

## 2019-10-16 DIAGNOSIS — F0390 Unspecified dementia without behavioral disturbance: Secondary | ICD-10-CM | POA: Diagnosis present

## 2019-10-16 DIAGNOSIS — M1990 Unspecified osteoarthritis, unspecified site: Secondary | ICD-10-CM | POA: Diagnosis not present

## 2019-10-16 DIAGNOSIS — Z794 Long term (current) use of insulin: Secondary | ICD-10-CM | POA: Insufficient documentation

## 2019-10-16 DIAGNOSIS — E1122 Type 2 diabetes mellitus with diabetic chronic kidney disease: Secondary | ICD-10-CM | POA: Insufficient documentation

## 2019-10-16 DIAGNOSIS — R410 Disorientation, unspecified: Secondary | ICD-10-CM | POA: Insufficient documentation

## 2019-10-16 DIAGNOSIS — R451 Restlessness and agitation: Secondary | ICD-10-CM | POA: Diagnosis not present

## 2019-10-16 DIAGNOSIS — R443 Hallucinations, unspecified: Secondary | ICD-10-CM

## 2019-10-16 DIAGNOSIS — F0391 Unspecified dementia with behavioral disturbance: Secondary | ICD-10-CM | POA: Diagnosis not present

## 2019-10-16 DIAGNOSIS — N183 Chronic kidney disease, stage 3 unspecified: Secondary | ICD-10-CM | POA: Diagnosis not present

## 2019-10-16 DIAGNOSIS — Z79899 Other long term (current) drug therapy: Secondary | ICD-10-CM | POA: Diagnosis not present

## 2019-10-16 DIAGNOSIS — Z20822 Contact with and (suspected) exposure to covid-19: Secondary | ICD-10-CM | POA: Insufficient documentation

## 2019-10-16 DIAGNOSIS — Z9183 Wandering in diseases classified elsewhere: Secondary | ICD-10-CM | POA: Diagnosis not present

## 2019-10-16 DIAGNOSIS — Z046 Encounter for general psychiatric examination, requested by authority: Secondary | ICD-10-CM | POA: Diagnosis present

## 2019-10-16 DIAGNOSIS — I129 Hypertensive chronic kidney disease with stage 1 through stage 4 chronic kidney disease, or unspecified chronic kidney disease: Secondary | ICD-10-CM | POA: Insufficient documentation

## 2019-10-16 NOTE — ED Triage Notes (Signed)
PER [employer] pt was at home living with her son and he took papers out on mother due to wondering out at night and talking to her dead father.  Pt stated her son was the crazy one and she was fine. Pt said she likes to walk at night. Pt said she does not hear voices. No thought of hurting herself or others.

## 2019-10-17 ENCOUNTER — Emergency Department (HOSPITAL_COMMUNITY): Payer: Medicare Other

## 2019-10-17 DIAGNOSIS — F0390 Unspecified dementia without behavioral disturbance: Secondary | ICD-10-CM | POA: Diagnosis present

## 2019-10-17 DIAGNOSIS — F0391 Unspecified dementia with behavioral disturbance: Secondary | ICD-10-CM

## 2019-10-17 LAB — COMPREHENSIVE METABOLIC PANEL
ALT: 13 U/L (ref 0–44)
AST: 19 U/L (ref 15–41)
Albumin: 3.8 g/dL (ref 3.5–5.0)
Alkaline Phosphatase: 84 U/L (ref 38–126)
Anion gap: 14 (ref 5–15)
BUN: 11 mg/dL (ref 8–23)
CO2: 23 mmol/L (ref 22–32)
Calcium: 9.5 mg/dL (ref 8.9–10.3)
Chloride: 106 mmol/L (ref 98–111)
Creatinine, Ser: 1.19 mg/dL — ABNORMAL HIGH (ref 0.44–1.00)
GFR calc Af Amer: 48 mL/min — ABNORMAL LOW (ref >60)
GFR calc non Af Amer: 41 mL/min — ABNORMAL LOW (ref >60)
Glucose, Bld: 128 mg/dL — ABNORMAL HIGH (ref 70–99)
Potassium: 3.3 mmol/L — ABNORMAL LOW (ref 3.5–5.1)
Sodium: 143 mmol/L (ref 135–145)
Total Bilirubin: 1 mg/dL (ref 0.3–1.2)
Total Protein: 7.2 g/dL (ref 6.5–8.1)

## 2019-10-17 LAB — URINALYSIS, ROUTINE W REFLEX MICROSCOPIC
Bilirubin Urine: NEGATIVE
Glucose, UA: NEGATIVE mg/dL
Hgb urine dipstick: NEGATIVE
Ketones, ur: NEGATIVE mg/dL
Leukocytes,Ua: NEGATIVE
Nitrite: NEGATIVE
Protein, ur: NEGATIVE mg/dL
Specific Gravity, Urine: 1.004 — ABNORMAL LOW (ref 1.005–1.030)
pH: 6 (ref 5.0–8.0)

## 2019-10-17 LAB — SALICYLATE LEVEL: Salicylate Lvl: <7 mg/dL — ABNORMAL LOW (ref 7.0–30.0)

## 2019-10-17 LAB — CBC
HCT: 47.9 % — ABNORMAL HIGH (ref 36.0–46.0)
Hemoglobin: 14.6 g/dL (ref 12.0–15.0)
MCH: 28.4 pg (ref 26.0–34.0)
MCHC: 30.5 g/dL (ref 30.0–36.0)
MCV: 93.2 fL (ref 80.0–100.0)
Platelets: 209 10*3/uL (ref 150–400)
RBC: 5.14 MIL/uL — ABNORMAL HIGH (ref 3.87–5.11)
RDW: 13.3 % (ref 11.5–15.5)
WBC: 6 10*3/uL (ref 4.0–10.5)
nRBC: 0 % (ref 0.0–0.2)

## 2019-10-17 LAB — CBG MONITORING, ED
Glucose-Capillary: 249 mg/dL — ABNORMAL HIGH (ref 70–99)
Glucose-Capillary: 276 mg/dL — ABNORMAL HIGH (ref 70–99)
Glucose-Capillary: 215 mg/dL — ABNORMAL HIGH (ref 70–99)

## 2019-10-17 LAB — RESPIRATORY PANEL BY RT PCR (FLU A&B, COVID)
Influenza A by PCR: NEGATIVE
Influenza B by PCR: NEGATIVE
SARS Coronavirus 2 by RT PCR: NEGATIVE

## 2019-10-17 LAB — RAPID URINE DRUG SCREEN, HOSP PERFORMED
Amphetamines: NOT DETECTED
Barbiturates: NOT DETECTED
Benzodiazepines: NOT DETECTED
Cocaine: NOT DETECTED
Opiates: NOT DETECTED
Tetrahydrocannabinol: NOT DETECTED

## 2019-10-17 LAB — ACETAMINOPHEN LEVEL: Acetaminophen (Tylenol), Serum: <10 ug/mL — ABNORMAL LOW (ref 10–30)

## 2019-10-17 LAB — ETHANOL: Alcohol, Ethyl (B): <10 mg/dL (ref <10)

## 2019-10-17 MED ORDER — POTASSIUM CHLORIDE CRYS ER 20 MEQ PO TBCR
40.0000 meq | EXTENDED_RELEASE_TABLET | Freq: Once | ORAL | Status: AC
  Administered 2019-10-17: 40 meq via ORAL
  Filled 2019-10-17: qty 2

## 2019-10-17 MED ORDER — LORAZEPAM 2 MG/ML IJ SOLN
0.5000 mg | Freq: Four times a day (QID) | INTRAMUSCULAR | Status: DC | PRN
  Administered 2019-10-17: 0.5 mg via INTRAMUSCULAR

## 2019-10-17 MED ORDER — LORAZEPAM 2 MG/ML IJ SOLN
0.5000 mg | Freq: Four times a day (QID) | INTRAMUSCULAR | Status: DC | PRN
  Filled 2019-10-17: qty 1

## 2019-10-17 MED ORDER — LOSARTAN POTASSIUM 50 MG PO TABS
25.0000 mg | ORAL_TABLET | Freq: Every day | ORAL | Status: DC
  Administered 2019-10-17 – 2019-10-21 (×4): 25 mg via ORAL
  Filled 2019-10-17 (×6): qty 1

## 2019-10-17 MED ORDER — ZIPRASIDONE MESYLATE 20 MG IM SOLR
10.0000 mg | Freq: Once | INTRAMUSCULAR | Status: AC
  Administered 2019-10-17: 10 mg via INTRAMUSCULAR
  Filled 2019-10-17: qty 20

## 2019-10-17 MED ORDER — LORAZEPAM 2 MG/ML IJ SOLN
1.0000 mg | Freq: Four times a day (QID) | INTRAMUSCULAR | Status: DC | PRN
  Administered 2019-10-17 – 2019-10-18 (×3): 1 mg via INTRAMUSCULAR
  Filled 2019-10-17 (×3): qty 1

## 2019-10-17 MED ORDER — CLONAZEPAM 0.5 MG PO TABS
0.5000 mg | ORAL_TABLET | Freq: Two times a day (BID) | ORAL | Status: DC
  Administered 2019-10-17 – 2019-10-21 (×8): 0.5 mg via ORAL
  Filled 2019-10-17 (×9): qty 1

## 2019-10-17 MED ORDER — SIMVASTATIN 20 MG PO TABS
20.0000 mg | ORAL_TABLET | Freq: Every evening | ORAL | Status: DC
  Administered 2019-10-19 – 2019-10-21 (×3): 20 mg via ORAL
  Filled 2019-10-17 (×5): qty 1

## 2019-10-17 MED ORDER — INSULIN ASPART PROT & ASPART (70-30 MIX) 100 UNIT/ML ~~LOC~~ SUSP
16.0000 [IU] | Freq: Two times a day (BID) | SUBCUTANEOUS | Status: DC
  Administered 2019-10-17 – 2019-10-20 (×6): 16 [IU] via SUBCUTANEOUS
  Filled 2019-10-17 (×2): qty 10

## 2019-10-17 MED ORDER — QUETIAPINE FUMARATE 25 MG PO TABS
12.5000 mg | ORAL_TABLET | Freq: Two times a day (BID) | ORAL | Status: DC | PRN
  Administered 2019-10-17: 14:00:00 12.5 mg via ORAL
  Filled 2019-10-17 (×2): qty 1

## 2019-10-17 MED ORDER — METOPROLOL SUCCINATE ER 25 MG PO TB24
200.0000 mg | ORAL_TABLET | Freq: Every day | ORAL | Status: DC
  Administered 2019-10-17 – 2019-10-21 (×4): 200 mg via ORAL
  Filled 2019-10-17: qty 2
  Filled 2019-10-17 (×3): qty 8

## 2019-10-17 MED ORDER — HYDROCHLOROTHIAZIDE 25 MG PO TABS
25.0000 mg | ORAL_TABLET | Freq: Every day | ORAL | Status: DC
  Administered 2019-10-17 – 2019-10-21 (×4): 25 mg via ORAL
  Filled 2019-10-17 (×5): qty 1

## 2019-10-17 MED ORDER — VITAMIN D 25 MCG (1000 UNIT) PO TABS
2000.0000 [IU] | ORAL_TABLET | Freq: Every day | ORAL | Status: DC
  Administered 2019-10-19 – 2019-10-21 (×3): 2000 [IU] via ORAL
  Filled 2019-10-17 (×4): qty 2

## 2019-10-17 MED ORDER — METOPROLOL SUCCINATE ER 25 MG PO TB24
150.0000 mg | ORAL_TABLET | Freq: Every evening | ORAL | Status: DC
  Administered 2019-10-19 – 2019-10-21 (×3): 150 mg via ORAL
  Filled 2019-10-17 (×5): qty 6

## 2019-10-17 MED ORDER — STERILE WATER FOR INJECTION IJ SOLN
INTRAMUSCULAR | Status: AC
Start: 2019-10-17 — End: 2019-10-17
  Administered 2019-10-17: 1.2 mL
  Filled 2019-10-17: qty 10

## 2019-10-17 MED ORDER — QUETIAPINE FUMARATE 25 MG PO TABS
12.5000 mg | ORAL_TABLET | Freq: Once | ORAL | Status: AC
  Administered 2019-10-17: 15:00:00 12.5 mg via ORAL

## 2019-10-17 MED ORDER — METFORMIN HCL ER 750 MG PO TB24
2000.0000 mg | ORAL_TABLET | Freq: Every day | ORAL | Status: DC
  Administered 2019-10-18: 1000 mg via ORAL
  Administered 2019-10-19 – 2019-10-20 (×2): 2000 mg via ORAL
  Filled 2019-10-17 (×7): qty 1

## 2019-10-17 MED ORDER — LORAZEPAM 1 MG PO TABS
1.0000 mg | ORAL_TABLET | Freq: Four times a day (QID) | ORAL | Status: DC | PRN
  Administered 2019-10-19 – 2019-10-21 (×3): 1 mg via ORAL
  Filled 2019-10-17 (×3): qty 1

## 2019-10-17 MED ORDER — QUETIAPINE FUMARATE 25 MG PO TABS
25.0000 mg | ORAL_TABLET | Freq: Two times a day (BID) | ORAL | Status: DC
  Administered 2019-10-18 – 2019-10-21 (×7): 25 mg via ORAL
  Filled 2019-10-17 (×9): qty 1

## 2019-10-17 MED ORDER — VERAPAMIL HCL ER 120 MG PO TBCR
120.0000 mg | EXTENDED_RELEASE_TABLET | Freq: Every day | ORAL | Status: DC
  Administered 2019-10-17 – 2019-10-21 (×3): 120 mg via ORAL
  Filled 2019-10-17 (×7): qty 1

## 2019-10-17 NOTE — ED Notes (Signed)
Pt noted to be resting quietly on bed. Dinner tray delivered. Will hold po meds, CBG check, and VS's until pt awakens.

## 2019-10-17 NOTE — Evaluation (Signed)
Physical Therapy Evaluation Patient Details Name: Kristen Bowman MRN: [id number] DOB: 13-Dec-1930 Today's Date: 10/17/2019   History of Present Illness  86 yo female brought to the ED for hallucinations, tendency to wander at night outside in the cold without coat; difficulty managing at home;  has a past medical history of Anemia, Anxiety states, Diabetes mellitus, Generalized osteoarthrosis, unspecified site, Hematuria, Hyperlipidemia, Hypertension, Irritable bowel syndrome, Other specified cardiac dysrhythmias(427.89), Panic disorder, Polymyalgia rheumatica ([place]), Proteinuria (05/29/10), and Type II or unspecified type diabetes mellitus with renal manifestations, uncontrolled(250.42) (05/29/10).  Clinical Impression   Pt admitted with above diagnosis. Comes from home, where it has been difficult keeping her safe recently; Presents to PT with grossly unsteady gait, cognitive deficits; She is aware of the moments of unsteadiness while wlaking today, and is agreeable to trying walking with a cane; Family seeking options for a Memory Care type unit for Ms. Kristen Bowman;  Pt currently with functional limitations due to the deficits listed below (see PT Problem List). Pt will benefit from skilled PT to increase their independence and safety with mobility to allow discharge to the venue listed below.       Follow Up Recommendations SNF;Other (comment)(Dementia-care specialty unit)    Equipment Recommendations  Cane    Recommendations for Other Services       Precautions / Restrictions Precautions Precautions: Fall      Mobility  Bed Mobility Overal bed mobility: Needs Assistance Bed Mobility: Supine to Sit;Sit to Supine     Supine to sit: Supervision Sit to supine: Supervision   General bed mobility comments: Supervision for safety (at one point, trying to leave the ED); Able to get up to EOB without physical assistance, noted she was leaning L at initial sitting, and drifted into lying back  down; got back sitting up with cues; Got back into bed without difficulty -- cues to move LEs to be centered in teh bed  Transfers Overall transfer level: Needs assistance Equipment used: None Transfers: Sit to/from Stand Sit to Stand: Supervision         General transfer comment: Stood from bed without difficulty; noted she had her hand on bedrail for steadiness  Ambulation/Gait Ambulation/Gait assistance: Min guard;Min Web designer (Feet): 50 Feet Assistive device: 2 person hand held assist Gait Pattern/deviations: Step-through pattern     General Gait Details: Unsteady gait with erratic step width and occasional scissoring; 2 losses of balance, needing min assist to steady; walked to the unit bathroom and back  Stairs            Wheelchair Mobility    Modified Rankin (Stroke Patients Only)       Balance                                             Pertinent Vitals/Pain Pain Assessment: No/denies pain    Home Living Family/patient expects to be discharged to:: Private residence Living Arrangements: Children Available Help at Discharge: Family(difficulty managing) Type of Home: House Home Access: Stairs to enter   [employer] of Steps: 3(3 in front, 10 in back) Home Layout: One level(per pt)   Additional Comments: Most of this information gleaned from chart review    Prior Function Level of Independence: Independent         Comments: walks without assistive device; Per [employer], the patient lives at home with her son  and he took out the IVC papers on his mother for wandering around out at night and talking to her dead father     Hand Dominance        Extremity/Trunk Assessment   Upper Extremity Assessment Upper Extremity Assessment: Overall WFL for tasks assessed(for simple tasks)    Lower Extremity Assessment Lower Extremity Assessment: Generalized weakness(gait unsteadiness)       Communication    Communication: No difficulties  Cognition Arousal/Alertness: Awake/alert Behavior During Therapy: WFL for tasks assessed/performed Overall Cognitive Status: No family/caregiver present to determine baseline cognitive functioning                                 General Comments: It seems there isn't a formal diagnosis of dementia; Neurology consulted      General Comments General comments (skin integrity, edema, etc.): She was able to walk me to the bathroom when I asked her to show me where the bathroom is    Exercises     Assessment/Plan    PT Assessment Patient needs continued PT services  PT Problem List Decreased activity tolerance;Decreased balance;Decreased mobility;Decreased cognition;Decreased knowledge of use of DME;Decreased safety awareness;Decreased knowledge of precautions(Altered Mental Status)       PT Treatment Interventions DME instruction;Gait training;Stair training;Functional mobility training;Therapeutic activities;Therapeutic exercise;Balance training;Neuromuscular re-education;Cognitive remediation;Patient/family education    PT Goals (Current goals can be found in the Care Plan section)  Acute Rehab PT Goals Patient Stated Goal: Agrees to trying a cane next session PT Goal Formulation: With patient Time For Goal Achievement: 10/31/19 Potential to Achieve Goals: Good Additional Goals Additional Goal #1: Pt will score 20/24 on the Dynamic Gait Index to demonstrate decr fall risk    Frequency Min 2X/week   Barriers to discharge Other (comment) Difficulty maanging safely at home; Family is seeking Memory Care-type living situation    Co-evaluation               AM-PAC PT "6 Clicks" Mobility  Outcome Measure Help needed turning from your back to your side while in a flat bed without using bedrails?: None Help needed moving from lying on your back to sitting on the side of a flat bed without using bedrails?: None Help needed moving to  and from a bed to a chair (including a wheelchair)?: None Help needed standing up from a chair using your arms (e.g., wheelchair or bedside chair)?: A Little Help needed to walk in hospital room?: A Little Help needed climbing 3-5 steps with a railing? : A Little 6 Click Score: 21    End of Session Equipment Utilized During Treatment: Gait belt Activity Tolerance: Patient tolerated treatment well Patient left: in bed;with call bell/phone within reach;with nursing/sitter in room Nurse Communication: Mobility status PT Visit Diagnosis: Unsteadiness on feet (R26.81);Other symptoms and signs involving the nervous system (R29.898)    Time: 1130(start time is approx)-1200 PT Time Calculation (min) (ACUTE ONLY): 30 min   Charges:   PT Evaluation $PT Eval Moderate Complexity: 1 Mod PT Treatments $Gait Training: 8-22 mins        Roney Marion, PT  Acute Rehabilitation Services Pager [phone] Office [place] 10/17/2019, 12:34 PM

## 2019-10-17 NOTE — ED Notes (Signed)
Pt attempted to leave ED. Stating she is going home. Pt refusing to return to room even after much encouragement. Security and [employer] assisted w/escorting pt to room. Pt refusing po meds. Pt refusing to eat breakfast and refusing apple sauce.
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
## 2019-10-17 NOTE — ED Notes (Signed)
Pt refusing to follow direction and is attempting to leave ED. Pt continues w/confusion and delusions. Pt pointing toward wall stating she needs to "get to that house over there". Security and Off-Duty [employer] Officer assisted w/pt remaining in room - verbal de-escalation. Order received for Ativan 1mg  IM and additional Seroquel 12.5mg  to be given now. Pt took med after much encouragement and tolerated injection well while sitting in chair in room. Pt slept in chair very briefly. Pt assisted to bed by staff. Pt attempting to get out of bed. Sitter and staff at bedside.

## 2019-10-17 NOTE — ED Provider Notes (Signed)
[facility] EMERGENCY DEPARTMENT Provider Note   CSN: [id number] Arrival date & time: 10/16/19  2309     History Chief Complaint  Patient presents with  . Medical Clearance    IVC    Kristen Bowman is a 86 y.o. female.  Patient presents to the emergency department with a chief complaint of hallucinations.  She is brought in under IVC.  Per [employer], the patient lives at home with her son and he took out the papers on his mother for wandering around out at night and talking to her dead father.  Patient states that she is concerned about her blood sugar being low.  She denies any pain.  Denies any symptoms whatsoever.  She states she does not hear voices.  She denies any SI/HI.  The history is provided by the patient. No language interpreter was used.       Past Medical History:  Diagnosis Date  . Anemia   . Anxiety states   . Diabetes mellitus    T2DM with renal manifestations  . Generalized osteoarthrosis, unspecified site   . Hematuria   . Hyperlipidemia    mixed  . Hypertension   . Irritable bowel syndrome   . Other specified cardiac dysrhythmias(427.89)    atrial fibrilation  . Panic disorder   . Polymyalgia rheumatica ([place])   . Proteinuria 05/29/10  . Type II or unspecified type diabetes mellitus with renal manifestations, uncontrolled(250.42) 05/29/10   CKD II (mild)    Patient Active Problem List   Diagnosis Date Noted  . Pulmonary hypertension ([place]) 06/09/2014  . Tachycardia 09/24/2012  . Palpitations 09/24/2012  . CKD (chronic kidney disease), stage II 09/24/2012  . Anemia, iron deficiency 09/24/2012  . Hypertension   . Panic disorder   . Hyperlipidemia   . Type II or unspecified type diabetes mellitus with renal manifestations, uncontrolled(250.42) 05/29/2010    Past Surgical History:  Procedure Laterality Date  . CATARACT EXTRACTION     right  . COLONOSCOPY N/A 11/04/2012   Procedure: COLONOSCOPY;  Surgeon: Arta Silence, MD;   [facility]: [facility];  Service: Endoscopy;  Laterality: N/A;  . ESOPHAGOGASTRODUODENOSCOPY N/A 11/04/2012   Procedure: ESOPHAGOGASTRODUODENOSCOPY (EGD);  Surgeon: Arta Silence, MD;  Location: [facility];  Service: Endoscopy;  Laterality: N/A;  . TUBAL LIGATION       OB History   No obstetric history on file.     Family History  Problem Relation Age of Onset  . Diabetes Sister   . Cancer Other   . Heart attack Other   . Emphysema Mother   . Leukemia Father     Social History   Tobacco Use  . Smoking status: Never Smoker  . Smokeless tobacco: Never Used  Substance Use Topics  . Alcohol use: No  . Drug use: No    Home Medications Prior to Admission medications   Medication Sig Start Date End Date Taking? Authorizing Provider  Cholecalciferol (VITAMIN D) 2000 UNITS tablet Take 2,000 Units by mouth daily.    [provider]  clonazePAM (KLONOPIN) 0.5 MG tablet Take 0.5 mg by mouth 2 (two) times daily. Takes 1/2 tab in a.m. and 1 tab at hs    [provider]  hydrochlorothiazide (HYDRODIURIL) 25 MG tablet Take 25 mg by mouth daily. 07/31/16   [provider]  insulin aspart protamine-insulin aspart (NOVOLOG MIX 70/30 FLEXPEN) (70-30) 100 UNIT/ML injection Inject 16 Units into the skin 2 (two) times daily.    [provider]  insulin lispro protamine-insulin lispro (HUMALOG MIX 75/25 KWIKPEN) (75-25) 100 UNIT/ML SUSP Inject 14-21 Units into the skin 2 (two) times daily.     [provider]  losartan (COZAAR) 100 MG tablet Take 25 mg by mouth daily. 08/08/16   [provider]  metFORMIN (GLUCOPHAGE-XR) 500 MG 24 hr tablet Take 2,000 mg by mouth daily. 08/14/16   [provider]  metoprolol (TOPROL-XL) 100 MG 24 hr tablet Take 150 mg by mouth every evening.     [provider]  metoprolol succinate (TOPROL-XL) 100 MG 24 hr tablet Take 200 mg by mouth daily. Take with or immediately following a meal.     [provider]  simvastatin (ZOCOR) 20 MG tablet Take 20 mg by mouth every evening.    [provider]  verapamil (CALAN-SR) 120 MG CR tablet Take 120 mg by mouth daily.    [provider]    Allergies    Ace inhibitors and Lisinopril  Review of Systems   Review of Systems  All other systems reviewed and are negative.   Physical Exam Updated Vital Signs Pulse 96   Temp 97.7 F (36.5 C) (Oral)   Resp 18   SpO2 97%   Physical Exam Vitals and nursing note reviewed.  Constitutional:      General: She is not in acute distress.    Appearance: She is well-developed.  HENT:     Head: Normocephalic and atraumatic.  Eyes:     Conjunctiva/sclera: Conjunctivae normal.  Cardiovascular:     Rate and Rhythm: Normal rate and regular rhythm.     Heart sounds: No murmur.  Pulmonary:     Effort: Pulmonary effort is normal. No respiratory distress.     Breath sounds: Normal breath sounds.  Abdominal:     Palpations: Abdomen is soft.     Tenderness: There is no abdominal tenderness.  Musculoskeletal:        General: Normal range of motion.     Cervical back: Neck supple.  Skin:    General: Skin is warm and dry.  Neurological:     Mental Status: She is alert. She is disoriented.     Comments: Not alert to place, does know the year but not the date, doesn't know the president  Psychiatric:        Mood and Affect: Mood normal.        Behavior: Behavior normal.     ED Results / Procedures / Treatments   Labs (all labs ordered are listed, but only abnormal results are displayed) Labs Reviewed  COMPREHENSIVE METABOLIC PANEL - Abnormal; Notable for the following components:      Result Value   Potassium 3.3 (*)    Glucose, Bld 128 (*)    Creatinine, Ser 1.19 (*)    GFR calc non Af Amer 41 (*)    GFR calc Af Amer 48 (*)    All other components within normal limits  SALICYLATE LEVEL - Abnormal; Notable for the following components:   Salicylate Lvl [id number]  (*)    All other components within normal limits  ACETAMINOPHEN LEVEL - Abnormal; Notable for the following components:   Acetaminophen (Tylenol), Serum <10 (*)    All other components within normal limits  CBC - Abnormal; Notable for the following components:   RBC 5.14 (*)    HCT 47.9 (*)    All other components within normal limits  ETHANOL  RAPID URINE DRUG SCREEN, HOSP PERFORMED  URINALYSIS,  ROUTINE W REFLEX MICROSCOPIC    EKG None  Radiology No results found.  Procedures Procedures (including critical care time)  Medications Ordered in ED Medications  potassium chloride SA (KLOR-CON) CR tablet 40 mEq (has no administration in time range)    ED Course  I have reviewed the triage vital signs and the nursing notes.  Pertinent labs & imaging results that were available during my care of the patient were reviewed by me and considered in my medical decision making (see chart for details).    MDM Rules/Calculators/A&P                      Patient here under IVC by son for walking around at night alone. Also reportedly talking to her deceased father.  Patient denies any hallucinations to me.  She is alert to person and time, but not place, thinking that she is in [facility].  In no acute distress.  Ordered regular meds and ok'd for RN to given BP meds early due to htn.    Medically clear for TTS evaluation.  Dispo pending psych.    Final Clinical Impression(s) / ED Diagnoses Final diagnoses:  None    Rx / DC Orders ED Discharge Orders    None       Montine Circle, PA-C 10/17/19 0544    Mesner, Corene Cornea, MD 10/17/19 [id number]    Merrily Pew, MD 10/17/19 [id number]

## 2019-10-17 NOTE — ED Notes (Addendum)
Pt states repeatedly she wants to leave - staff re-directing pt. Attempted to read book to pt.

## 2019-10-17 NOTE — ED Notes (Signed)
1st Exam completed by Dr Dayna Barker - Copy faxed to [facility] - Copy sent to Medical Records - Original placed in folder for Magistrate - ALL 3 sets on clipboard. TTS completed.

## 2019-10-17 NOTE — Consult Note (Signed)
Telepsych Consultation   Reason for Consult:  Confusion and agitation Referring Physician:  EDP Location of Patient:  Location of Provider: [facility] Department  Patient Identification: Kristen Bowman Bowman MRN:  [id number] Principal Diagnosis: Dementia ([place]) Diagnosis:  Principal Problem:   Dementia ([place])   Total Time spent with patient: 1 hour  Subjective:   Kristen Bowman Bowman is a 86 y.o. female patient reports that she does not know why she is at the hospital.  Patient asks me "did something happen last night?"  Patient denies any complaints at this time.  Patient is able to tell me her name but does not know her date of birth.  Patient does tell me the date but patient has been reminded multiple times of the day since she has been into the emergency room already.  Patient states to me that she lives on [address] and she actually lives on [place].  Patient denies any confusion.  She also denies having any suicidal homicidal ideations and denies any hallucinations.  Patient reports that she only has 1 child which is Kristen Bowman Bowman.  Patient is refusing to take any medications for staff at this time and tells me that it is not the right time of the day to take medications but is unable to tell me what time of the day it is currently.  Kristen Bowman Bowman, patient's son and POA, was contacted for collateral information.  He reports that the patient has been dealing with confusion and some agitation at times for the last 4 to 5 months.  He states that he has been progressing very quickly.  He states that that she has no psychiatric history except for being diagnosed with some panic attacks in the 1990s.  He states that they have been having a lot of issues with her wandering away from the house and has become a safety concern.  He states that she does become agitated sometimes when she is told no.  He states that they have been repeatedly contacting the patient's PCP and they have been refusing to see  her and have not been of any assistance to assist her in getting into a memory care unit or into a SNF.  He reports that he has finally gotten them to make a referral without seeing the patient to neurology which is on Kristen Bowman 6 and the PCP is agreed to see her on March 12 now.  He states that he was told by the PCP on the phone to take her to the emergency room if they were unable to keep her safe and then the house.  He states that they are simply looking for assistance in maintaining her safety and possibly getting her into another facility because she is becoming unmanageable at home.  He states that he does not feel that she needs to be placed into a psychiatric facility either.  Kristen Bowman Bowman, patient's daughter and also a POA, was contacted for collateral information.  She reports the same information as her son.  She reports that the patient does become agitated when she is told no and last night she was walking around outside and extreme cold and was not willing to go back in the house.  She states that they are concerned that she is going to wander away from the house and they are looking for assistance to help calm her down with either medications or to have her placed into a memory care unit or into a SNF.  They  state that they understand that this is their mother and they would not like to see her in a facility but at the moment she is becoming unmanageable at home and they are concerned.  She also reports that they have contacted some memory care units but because none of her doctors would see her recently they would not give her the diagnosis that would get her admitted.  She reports that the patient is continually confused about what is going on and it makes it extremely difficult to manage her at home.  HPI:  Per EDP: 86 y.o. female. Patient presents to the emergency department with a chief complaint of hallucinations.  She is brought in under IVC.  Per [facility], the patient lives at home with her son and  he took out the papers on his mother for wandering around out at night and talking to her dead father.  Patient states that she is concerned about her blood sugar being low.  She denies any pain.  Denies any symptoms whatsoever.  She states she does not hear voices.  She denies any SI/HI.  Patient is seen by this provider via telepsych and have consulted with Dr. Parke Poisson.  Patient does show significant confusion.  CT scan does show moderate atrophy and chronic small vessel ischemia.  Per the family report this is progressed quickly over the last 4 to 5 months with the confusion and some agitation at times.  Family collateral and as the patient reports as well there is no psychiatric history except for some anxiety that was documented in the 1990s.  At this time the patient does not meet criteria for an inpatient psychiatric treatment and is psychiatrically cleared.  However, feel that it would be recommendation to have neurology to consult for confirmation of dementia criteria and a social work consult to assist in helping place the patient into a safer environment such as a memory care unit or in SNF.  The family was informed that the patient may not be placed directly from the emergency department and they stated understanding and agreement.  I have started some medications to assist with the patient's agitation when she is told no.  I have attempted to contact Dr. Jeanell Bowman by telephone but she was unavailable and I have sent her a secure chat message through epic about the recommendations.  I have also ordered a social work consult at this time and spoken with the RN that is treating the patient in the ED.  Past Psychiatric History: Reports of anxiety in the 78s.  No psychiatric medications, no psychiatric hospitalizations, no other psychiatric diagnoses reported.  Risk to Self: Suicidal Ideation: No Suicidal Intent: No Is patient at risk for suicide?: No Suicidal Plan?: No Access to Means: No Triggers for  Past Attempts: None known Intentional Self Injurious Behavior: None Risk to Others: Homicidal Ideation: No Thoughts of Harm to Others: No Current Homicidal Intent: No Current Homicidal Plan: No Access to Homicidal Means: No History of harm to others?: No Assessment of Violence: None Noted Does patient have access to weapons?: No Criminal Charges Pending?: No Does patient have a court date: No Prior Inpatient Therapy: Prior Inpatient Therapy: No Prior Outpatient Therapy: Prior Outpatient Therapy: No Does patient have an ACCT team?: No Does patient have Intensive In-House Services?  : No Does patient have [facility] services? : No Does patient have P4CC services?: No  Past Medical History:  Past Medical History:  Diagnosis Date  . Anemia   . Anxiety states   .  Diabetes mellitus    T2DM with renal manifestations  . Generalized osteoarthrosis, unspecified site   . Hematuria   . Hyperlipidemia    mixed  . Hypertension   . Irritable bowel syndrome   . Other specified cardiac dysrhythmias(427.89)    atrial fibrilation  . Panic disorder   . Polymyalgia rheumatica ([place])   . Proteinuria 05/29/10  . Type II or unspecified type diabetes mellitus with renal manifestations, uncontrolled(250.42) 05/29/10   CKD II (mild)    Past Surgical History:  Procedure Laterality Date  . CATARACT EXTRACTION     right  . COLONOSCOPY N/A 11/04/2012   Procedure: COLONOSCOPY;  Surgeon: Arta Silence, MD;  Location: [facility] ENDOSCOPY;  Service: Endoscopy;  Laterality: N/A;  . ESOPHAGOGASTRODUODENOSCOPY N/A 11/04/2012   Procedure: ESOPHAGOGASTRODUODENOSCOPY (EGD);  Surgeon: Arta Silence, MD;  Location: [facility];  Service: Endoscopy;  Laterality: N/A;  . TUBAL LIGATION     Family History:  Family History  Problem Relation Age of Onset  . Diabetes Sister   . Cancer Other   . Heart attack Other   . Emphysema Mother   . Leukemia Father    Family Psychiatric  History: None reported Social History:   Social History   Substance and Sexual Activity  Alcohol Use No     Social History   Substance and Sexual Activity  Drug Use No    Social History   Socioeconomic History  . Marital status: Divorced    Spouse name: Not on file  . Number of children: Not on file  . Years of education: Not on file  . Highest education level: Not on file  Occupational History  . Not on file  Tobacco Use  . Smoking status: Never Smoker  . Smokeless tobacco: Never Used  Substance and Sexual Activity  . Alcohol use: No  . Drug use: No  . Sexual activity: Not on file  Other Topics Concern  . Not on file  Social History Narrative  . Not on file   Social Determinants of Health   Financial Resource Strain:   . Difficulty of Paying Living Expenses: Not on file  Food Insecurity:   . Worried About Charity fundraiser in the Last Year: Not on file  . Ran Out of Food in the Last Year: Not on file  Transportation Needs:   . Lack of Transportation (Medical): Not on file  . Lack of Transportation (Non-Medical): Not on file  Physical Activity:   . Days of Exercise per Week: Not on file  . Minutes of Exercise per Session: Not on file  Stress:   . Feeling of Stress : Not on file  Social Connections:   . Frequency of Communication with Friends and Family: Not on file  . Frequency of Social Gatherings with Friends and Family: Not on file  . Attends Religious Services: Not on file  . Active Member of Clubs or Organizations: Not on file  . Attends Archivist Meetings: Not on file  . Marital Status: Not on file   Additional Social History:    Allergies:   Allergies  Allergen Reactions  . Ace Inhibitors Shortness Of Breath  . Lisinopril Shortness Of Breath    Labs:  Results for orders placed or performed during the hospital encounter of 10/16/19 (from the past 48 hour(s))  Comprehensive metabolic panel     Status: Abnormal   Collection Time: 10/16/19 12:04 AM  Result Value Ref  Range   Sodium 143 135 -  145 mmol/L   Potassium 3.3 (L) 3.5 - 5.1 mmol/L   Chloride 106 98 - 111 mmol/L   CO2 23 22 - 32 mmol/L   Glucose, Bld 128 (H) 70 - 99 mg/dL   BUN 11 8 - 23 mg/dL   Creatinine, Ser 1.19 (H) 0.44 - 1.00 mg/dL   Calcium 9.5 8.9 - 10.3 mg/dL   Total Protein 7.2 6.5 - 8.1 g/dL   Albumin 3.8 3.5 - 5.0 g/dL   AST 19 15 - 41 U/L   ALT 13 0 - 44 U/L   Alkaline Phosphatase 84 38 - 126 U/L   Total Bilirubin 1.0 0.3 - 1.2 mg/dL   GFR calc non Af Amer 41 (L) >60 mL/min   GFR calc Af Amer 48 (L) >60 mL/min   Anion gap 14 5 - 15    Comment: Performed at [facility] [address], [place], [place] [address]  Ethanol     Status: None   Collection Time: 10/16/19 12:04 AM  Result Value Ref Range   Alcohol, Ethyl (B) <10 <10 mg/dL    Comment: (NOTE) Lowest detectable limit for serum alcohol is 10 mg/dL. For medical purposes only. Performed at [facility], [place] [address], [place], [place] [address] CORRECTED ON 02/21 AT 0113: PREVIOUSLY REPORTED AS [id number]   Salicylate level     Status: Abnormal   Collection Time: 10/16/19 12:04 AM  Result Value Ref Range   Salicylate Lvl [id number] (L) 7.0 - 30.0 mg/dL    Comment: Performed at [facility] [address], [place], [place] [address]  Acetaminophen level     Status: Abnormal   Collection Time: 10/16/19 12:04 AM  Result Value Ref Range   Acetaminophen (Tylenol), Serum <10 (L) 10 - 30 ug/mL    Comment: (NOTE) Therapeutic concentrations vary significantly. A range of 10-30 ug/mL  may be an effective concentration for many patients. However, some  are best treated at concentrations outside of this range. Acetaminophen concentrations >150 ug/mL at 4 hours after ingestion  and >50 ug/mL at 12 hours after ingestion are often associated with  toxic reactions. Performed at [facility], [place] [address], [place], [place] [address]   cbc     Status: Abnormal   Collection Time: 10/16/19 12:04 AM  Result  Value Ref Range   WBC 6.0 4.0 - 10.5 K/uL   RBC 5.14 (H) 3.87 - 5.11 MIL/uL   Hemoglobin 14.6 12.0 - 15.0 g/dL   HCT 47.9 (H) 36.0 - 46.0 %   MCV 93.2 80.0 - 100.0 fL   MCH 28.4 26.0 - 34.0 pg   MCHC 30.5 30.0 - 36.0 g/dL   RDW 13.3 11.5 - 15.5 %   Platelets 209 150 - 400 K/uL   nRBC 0.0 0.0 - 0.2 %    Comment: Performed at [facility], [place] [address], [place],  [address]  Respiratory Panel by RT PCR (Flu A&B, Covid) - Nasopharyngeal Swab     Status: None   Collection Time: 10/17/19  3:35 AM   Specimen: Nasopharyngeal Swab  Result Value Ref Range   SARS Coronavirus 2 by RT PCR NEGATIVE NEGATIVE    Comment: (NOTE) SARS-CoV-2 target nucleic acids are NOT DETECTED. The SARS-CoV-2 RNA is generally detectable in upper respiratoy specimens during the acute phase of infection. The lowest concentration of SARS-CoV-2 viral copies this assay can detect is 131 copies/mL. A negative result does not preclude SARS-Cov-2 infection and  should not be used as the sole basis for treatment or other patient management decisions. A negative result may occur with  improper specimen collection/handling, submission of specimen other than nasopharyngeal swab, presence of viral mutation(s) within the areas targeted by this assay, and inadequate number of viral copies (<131 copies/mL). A negative result must be combined with clinical observations, patient history, and epidemiological information. The expected result is Negative. Fact Sheet for Patients:  PinkCheek.be Fact Sheet for Healthcare Providers:  GravelBags.it This test is not yet ap proved or cleared by the [place] FDA and  has been authorized for detection and/or diagnosis of SARS-CoV-2 by FDA under an Emergency Use Authorization (EUA). This EUA will remain  in effect (meaning this test can be used) for the duration of the COVID-19 declaration under Section 564(b)(1) of  the Act, 21 U.S.C. section 360bbb-3(b)(1), unless the authorization is terminated or revoked sooner.    Influenza A by PCR NEGATIVE NEGATIVE   Influenza B by PCR NEGATIVE NEGATIVE    Comment: (NOTE) The Xpert Xpress SARS-CoV-2/FLU/RSV assay is intended as an aid in  the diagnosis of influenza from Nasopharyngeal swab specimens and  should not be used as a sole basis for treatment. Nasal washings and  aspirates are unacceptable for Xpert Xpress SARS-CoV-2/FLU/RSV  testing. Fact Sheet for Patients: PinkCheek.be Fact Sheet for Healthcare Providers: GravelBags.it This test is not yet approved or cleared by the [place] FDA and  has been authorized for detection and/or diagnosis of SARS-CoV-2 by  FDA under an Emergency Use Authorization (EUA). This EUA will remain  in effect (meaning this test can be used) for the duration of the  Covid-19 declaration under Section 564(b)(1) of the Act, 21  U.S.C. section 360bbb-3(b)(1), unless the authorization is  terminated or revoked. Performed at [facility], [place] [address], [place], [place] [address]   Rapid urine drug screen (hospital performed)     Status: None   Collection Time: 10/17/19  3:51 AM  Result Value Ref Range   Opiates NONE DETECTED NONE DETECTED   Cocaine NONE DETECTED NONE DETECTED   Benzodiazepines NONE DETECTED NONE DETECTED   Amphetamines NONE DETECTED NONE DETECTED   Tetrahydrocannabinol NONE DETECTED NONE DETECTED   Barbiturates NONE DETECTED NONE DETECTED    Comment: (NOTE) DRUG SCREEN FOR MEDICAL PURPOSES ONLY.  IF CONFIRMATION IS NEEDED FOR ANY PURPOSE, NOTIFY LAB WITHIN 5 DAYS. LOWEST DETECTABLE LIMITS FOR URINE DRUG SCREEN Drug Class                     Cutoff (ng/mL) Amphetamine and metabolites    1000 Barbiturate and metabolites    200 Benzodiazepine                 [id number] Tricyclics and metabolites     300 Opiates and metabolites         300 Cocaine and metabolites        300 THC                            50 Performed at [facility], [place] [address], [place], [place] [address]   Urinalysis, Routine w reflex microscopic     Status: Abnormal   Collection Time: 10/17/19  3:51 AM  Result Value Ref Range   Color, Urine STRAW (A) YELLOW   APPearance CLEAR CLEAR   Specific Gravity, Urine 1.004 (L) 1.005 - 1.030   pH 6.0  5.0 - 8.0   Glucose, UA NEGATIVE NEGATIVE mg/dL   Hgb urine dipstick NEGATIVE NEGATIVE   Bilirubin Urine NEGATIVE NEGATIVE   Ketones, ur NEGATIVE NEGATIVE mg/dL   Protein, ur NEGATIVE NEGATIVE mg/dL   Nitrite NEGATIVE NEGATIVE   Leukocytes,Ua NEGATIVE NEGATIVE    Comment: Performed at [facility] [address] [place], [place] [address]  CBG monitoring, ED     Status: Abnormal   Collection Time: 10/17/19  8:15 AM  Result Value Ref Range   Glucose-Capillary 249 (H) 70 - 99 mg/dL    Medications:  Current Facility-Administered Medications  Medication Dose Route Frequency Provider Last Rate Last Admin  . cholecalciferol (VITAMIN D3) tablet 2,000 Units  2,000 Units Oral Daily Montine Circle, PA-C      . clonazePAM Bobbye [place]) tablet 0.5 mg  0.5 mg Oral BID Montine Circle, PA-C   0.5 mg at 10/17/19 0313  . hydrochlorothiazide (HYDRODIURIL) tablet 25 mg  25 mg Oral Daily Montine Circle, PA-C   25 mg at 10/17/19 0451  . insulin aspart protamine- aspart (NOVOLOG MIX 70/30) injection 16 Units  16 Units Subcutaneous BID WC Montine Circle, PA-C      . LORazepam (ATIVAN) injection 0.5 mg  0.5 mg Intravenous Q6H PRN Jenya Putz, Lowry Ram, FNP      . losartan (COZAAR) tablet 25 mg  25 mg Oral Daily Montine Circle, PA-C   25 mg at 10/17/19 0452  . metFORMIN (GLUCOPHAGE-XR) 24 hr tablet 2,000 mg  2,000 mg Oral Q breakfast Montine Circle, PA-C      . metoprolol succinate (TOPROL-XL) 24 hr tablet 150 mg  150 mg Oral QPM Montine Circle, PA-C      . metoprolol succinate (TOPROL-XL) 24 hr tablet 200  mg  200 mg Oral Daily Montine Circle, PA-C   200 mg at 10/17/19 0451  . QUEtiapine (SEROQUEL) tablet 12.5 mg  12.5 mg Oral BID PRN Braxton Weisbecker, Lowry Ram, FNP      . simvastatin (ZOCOR) tablet 20 mg  20 mg Oral QPM Montine Circle, PA-C      . verapamil (CALAN-SR) CR tablet 120 mg  120 mg Oral Daily Montine Circle, PA-C   120 mg at 10/17/19 0455  . ziprasidone (GEODON) injection 10 mg  10 mg Intramuscular Once Kellan Boehlke, Lowry Ram, FNP       Current Outpatient Medications  Medication Sig Dispense Refill  . Cholecalciferol (VITAMIN D) 2000 UNITS tablet Take 2,000 Units by mouth daily.    . clonazePAM (KLONOPIN) 0.5 MG tablet Take 0.5 mg by mouth 2 (two) times daily. Takes 1/2 tab in a.m. and 1 tab at hs    . hydrochlorothiazide (HYDRODIURIL) 25 MG tablet Take 25 mg by mouth daily.    . insulin aspart protamine-insulin aspart (NOVOLOG MIX 70/30 FLEXPEN) (70-30) 100 UNIT/ML injection Inject 16 Units into the skin 2 (two) times daily.    . insulin lispro protamine-insulin lispro (HUMALOG MIX 75/25 KWIKPEN) (75-25) 100 UNIT/ML SUSP Inject 14-21 Units into the skin 2 (two) times daily.     Marland Kitchen losartan (COZAAR) 100 MG tablet Take 25 mg by mouth daily.    . metFORMIN (GLUCOPHAGE-XR) 500 MG 24 hr tablet Take 2,000 mg by mouth daily.    . metoprolol (TOPROL-XL) 100 MG 24 hr tablet Take 150 mg by mouth every evening.     . metoprolol succinate (TOPROL-XL) 100 MG 24 hr tablet Take 200 mg by mouth daily. Take with or immediately following a meal.    . simvastatin (  ZOCOR) 20 MG tablet Take 20 mg by mouth every evening.    . verapamil (CALAN-SR) 120 MG CR tablet Take 120 mg by mouth daily.      Musculoskeletal: Strength & Muscle Tone: within normal limits Gait & Station: Patient remained in bed during evaluation Patient leans: N/A  Psychiatric Specialty Exam: Physical Exam  Nursing note and vitals reviewed. Constitutional: She appears well-developed and well-nourished.  Cardiovascular: Normal rate.   Respiratory: Effort normal.  Musculoskeletal:        General: Normal range of motion.  Neurological: She is alert.  Skin: Skin is warm.    Review of Systems  Constitutional: Negative.   HENT: Negative.   Eyes: Negative.   Respiratory: Negative.   Cardiovascular: Negative.   Gastrointestinal: Negative.   Genitourinary: Negative.   Musculoskeletal: Negative.   Skin: Negative.   Neurological: Negative.   Psychiatric/Behavioral: Positive for agitation and confusion.    Blood pressure (!) 183/78, pulse 72, temperature 97.8 F (36.6 C), temperature source Oral, resp. rate 18, SpO2 98 %.There is no height or weight on file to calculate BMI.  General Appearance: Disheveled  Eye Contact:  Fair  Speech:  Clear and Coherent and Normal Rate  Volume:  Normal  Mood:  Euthymic  Affect:  Congruent  Thought Process:  Irrelevant and Descriptions of Associations: Circumstantial  Orientation:  Other:  Person  Thought Content:  Mixed at times and depending on information given, no hallucinations and no delusions   Suicidal Thoughts:  No  Homicidal Thoughts:  No  Memory:  Immediate;   Poor Recent;   Poor Remote;   Poor  Judgement:  Impaired  Insight:  Lacking  Psychomotor Activity:  Increased  Concentration:  Concentration: Poor  Recall:  Poor  Fund of Knowledge:  Poor  Language:  Good  Akathisia:  No  Handed:  Right  AIMS (if indicated):     Assets:  Agricultural consultant Housing Resilience Social Support  ADL's:  Intact  Cognition:  Impaired,  Severe  Sleep:        Treatment Plan Summary: Medication management and cleared from psychiatry Social work consult to assist with SNF or memory care unit placement Neurology consult Seroquel 12.5 mg p.o. twice daily as needed for agitation Continue Klonopin 0.5 mg p.o. twice daily Start Ativan 0.5 IM every 6 hours as needed for anxiety agitation Geodon 10 mg IM 1 time dose if needed for  agitation  Disposition: Patient does not meet criteria for psychiatric inpatient admission.  This service was provided via telemedicine using a 2-way, interactive audio and video technology.  Names of all persons participating in this telemedicine service and their role in this encounter. Name: Kristen Bowman Bowman  Role: Patient  Name: Darnelle Maffucci Sullivan Blasing NP Role: Provider  Name: Kristen Bowman Bowman (via phone) Role: Daughter POA  Name: Kristen Bowman Bowman (via phone) Role: Son POA    Lewis Shock, [place] 10/17/2019 9:14 AM

## 2019-10-17 NOTE — ED Notes (Addendum)
Pt attempting to leave ED - states "I've got to go home". Pt encouraged to remain in room. Pt given Seroquel - took w/much encouragement. Diet Coke given. Pt given book to read. Also attempted to re-direct pt w/tv.

## 2019-10-17 NOTE — TOC Initial Note (Signed)
Transition of Care [facility]) - Initial/Assessment Note    Patient Details  Name: Kristen Bowman MRN: [id number] Date of Birth: 05-15-1931  Transition of Care [facility]) CM/SW Contact:    Oretha Milch, LCSW Phone Number: 10/17/2019, 11:25 AM  Clinical Narrative: CSW received consult for skilled nursing facility placement as patient has been cleared by behavioral health. Per behavioral health patient's recent agitation and aggression is primarily dementia-related behavior. CSW notes patient is awaiting a formal neurology evaluation to confirm dementia diagnosis. Patient presented as appropriate during assessment with some confusion. Patient reports she has been living with her parents (per collateral its only her son) and is trying to go back home. Patient was unclear where her home was but said it is not in [place]. Per son patient has lived in [place] for quite some time. Son reports patient has recently began to wander and notes there is three steps to the front door and ten steps out that back door. Son reports he is working on trying to secure both doors. Per son patient does not have a walker or wheelchair at home. Both patient and son Education officer, community) consented for SNF referral. CSW notes they both requested facilities to be in [place] or very close to [place].                   Expected Discharge Plan: Skilled Nursing Facility Barriers to Discharge: SNF Pending bed offer   Patient Goals and CMS Choice Patient states their goals for this hospitalization and ongoing recovery are:: "I want to do what I need to." CMS Medicare.gov Compare Post Acute Care list provided to:: Patient Choice offered to / list presented to : Patient, Adult Children  Expected Discharge Plan and Services Expected Discharge Plan: [place]   Discharge Planning Services: NA Post Acute Care Choice: [place] Living arrangements for the past 2 months: Single Family Home                                      Prior Living Arrangements/Services Living arrangements for the past 2 months: Single Family Home Lives with:: Adult Children Patient language and need for interpreter reviewed:: Yes        Need for Family Participation in Patient Care: Yes (Comment) Care giver support system in place?: Yes (comment)   Criminal Activity/Legal Involvement Pertinent to Current Situation/Hospitalization: No - Comment as needed  Activities of Daily Living Home Assistive Devices/Equipment: None ADL Screening (condition at time of admission) Patient's cognitive ability adequate to safely complete daily activities?: Yes Is the patient deaf or have difficulty hearing?: No Does the patient have difficulty seeing, even when wearing glasses/contacts?: No Does the patient have difficulty concentrating, remembering, or making decisions?: Yes(sometimes but I work it out) Patient able to express need for assistance with ADLs?: Yes Does the patient have difficulty dressing or bathing?: No Independently performs ADLs?: Yes (appropriate for developmental age) Does the patient have difficulty walking or climbing stairs?: No Weakness of Legs: None Weakness of Arms/Hands: None  Permission Sought/Granted Permission sought to share information with : Case Manager, Customer service manager, Family Supports Permission granted to share information with : Yes, Verbal Permission Granted  Share Information with NAME: Laroyce Wujcik, son, [phone]  Permission granted to share info w AGENCY: Facility liaisons        Emotional Assessment Appearance:: Appears stated age Attitude/Demeanor/Rapport: Engaged Affect (typically observed): Appropriate  Orientation: : Fluctuating Orientation (Suspected and/or reported Sundowners) Alcohol / Substance Use: Not Applicable Psych Involvement: No (comment)  Admission diagnosis:  IVC Patient Active Problem List   Diagnosis Date Noted  . Dementia ([place])  10/17/2019  . Pulmonary hypertension ([place]) 06/09/2014  . Tachycardia 09/24/2012  . Palpitations 09/24/2012  . CKD (chronic kidney disease), stage II 09/24/2012  . Anemia, iron deficiency 09/24/2012  . Hypertension   . Panic disorder   . Hyperlipidemia   . Type II or unspecified type diabetes mellitus with renal manifestations, uncontrolled(250.42) 05/29/2010   PCP:  Mayra Neer, MD Pharmacy:   [id number] - [place], [place] [id number]  [place] [id number] Phone: [phone] Fax: [phone]     Social Determinants of Health (SDOH) Interventions    Readmission Risk Interventions No flowsheet data found.

## 2019-10-17 NOTE — ED Notes (Signed)
Pt sitting on side of bed - ate bite of cheese and is talking w/Sitter.

## 2019-10-17 NOTE — ED Notes (Signed)
Dr Jeanell Sparrow aware of tx plan - Psych cleared - Consult to SW has been ordered so may attempt to seek SNF placement - If not, pt to return home for family to seek placement.

## 2019-10-17 NOTE — ED Notes (Signed)
Ordered breakfast--Kristen Bowman 

## 2019-10-17 NOTE — ED Notes (Signed)
Pt bathing in room. Sitter w/pt.

## 2019-10-17 NOTE — NC FL2 (Signed)
[place] MEDICAID FL2 LEVEL OF CARE SCREENING TOOL     IDENTIFICATION  Patient Name: Kristen Bowman Birthdate: 1930-10-18 Sex: female Admission Date (Current Location): 10/16/2019  [facility] and [place] Number:  Herbalist and Address:  [facility] [place] [place]. [facility], [place] [address], [place], [place] [address]      Provider Number: [id number]  Attending Physician Name and Address:  Default, Provider, MD  Relative Name and Phone Number:  Chrsitina Deitz [phone]    Current Level of Care: Hospital Recommended Level of Care: [place] Prior Approval Number:    Date Approved/Denied:   PASRR Number: PASSR under review  Discharge Plan: ICF    Current Diagnoses: Patient Active Problem List   Diagnosis Date Noted  . Dementia ([place]) 10/17/2019  . Pulmonary hypertension ([place]) 06/09/2014  . Tachycardia 09/24/2012  . Palpitations 09/24/2012  . CKD (chronic kidney disease), stage II 09/24/2012  . Anemia, iron deficiency 09/24/2012  . Hypertension   . Panic disorder   . Hyperlipidemia   . Type II or unspecified type diabetes mellitus with renal manifestations, uncontrolled(250.42) 05/29/2010    Orientation RESPIRATION BLADDER Height & Weight        Normal Continent Weight:   Height:     BEHAVIORAL SYMPTOMS/MOOD NEUROLOGICAL BOWEL NUTRITION STATUS  Physically abusive   Continent Diet(Carb modified with thin liquids)  AMBULATORY STATUS COMMUNICATION OF NEEDS Skin   Limited Assist Verbally                         Personal Care Assistance Level of Assistance  Bathing, Feeding, Dressing Bathing Assistance: Maximum assistance Feeding assistance: Limited assistance Dressing Assistance: Limited assistance     Functional Limitations Info             SPECIAL CARE FACTORS FREQUENCY  PT (By licensed PT), OT (By licensed OT)     PT Frequency: 5x weekly OT Frequency: 5x weekly            Contractures      Additional Factors Info   Code Status, Allergies Code Status Info: FULL Allergies Info: ACE inhibitors, lisinopril           Current Medications (10/17/2019):  This is the current hospital active medication list Current Facility-Administered Medications  Medication Dose Route Frequency Provider Last Rate Last Admin  . cholecalciferol (VITAMIN D3) tablet 2,000 Units  2,000 Units Oral Daily Montine Circle, PA-C      . clonazePAM Bobbye [place]) tablet 0.5 mg  0.5 mg Oral BID Montine Circle, PA-C   0.5 mg at 10/17/19 0313  . hydrochlorothiazide (HYDRODIURIL) tablet 25 mg  25 mg Oral Daily Montine Circle, PA-C   25 mg at 10/17/19 0451  . insulin aspart protamine- aspart (NOVOLOG MIX 70/30) injection 16 Units  16 Units Subcutaneous BID WC Montine Circle, PA-C   16 Units at 10/17/19 0959  . LORazepam (ATIVAN) injection 0.5 mg  0.5 mg Intramuscular Q6H PRN Money, Darnelle Maffucci B, FNP   0.5 mg at 10/17/19 0958  . losartan (COZAAR) tablet 25 mg  25 mg Oral Daily Montine Circle, PA-C   25 mg at 10/17/19 0452  . metFORMIN (GLUCOPHAGE-XR) 24 hr tablet 2,000 mg  2,000 mg Oral Q breakfast Montine Circle, PA-C      . metoprolol succinate (TOPROL-XL) 24 hr tablet 150 mg  150 mg Oral QPM Montine Circle, PA-C      . metoprolol succinate (TOPROL-XL) 24 hr tablet 200 mg  200  mg Oral Daily Montine Circle, PA-C   200 mg at 10/17/19 0451  . QUEtiapine (SEROQUEL) tablet 12.5 mg  12.5 mg Oral BID PRN Money, Lowry Ram, FNP      . simvastatin (ZOCOR) tablet 20 mg  20 mg Oral QPM Montine Circle, PA-C      . verapamil (CALAN-SR) CR tablet 120 mg  120 mg Oral Daily Montine Circle, PA-C   120 mg at 10/17/19 [id number]   Current Outpatient Medications  Medication Sig Dispense Refill  . Cholecalciferol (VITAMIN D) 2000 UNITS tablet Take 2,000 Units by mouth daily.    . clonazePAM (KLONOPIN) 0.5 MG tablet Take 0.5 mg by mouth 2 (two) times daily. Takes 1/2 tab in a.m. and 1 tab at hs    . hydrochlorothiazide (HYDRODIURIL) 25 MG tablet Take 25 mg  by mouth daily.    . insulin aspart protamine-insulin aspart (NOVOLOG MIX 70/30 FLEXPEN) (70-30) 100 UNIT/ML injection Inject 16 Units into the skin 2 (two) times daily.    . insulin lispro protamine-insulin lispro (HUMALOG MIX 75/25 KWIKPEN) (75-25) 100 UNIT/ML SUSP Inject 14-21 Units into the skin 2 (two) times daily.     Marland Kitchen losartan (COZAAR) 100 MG tablet Take 25 mg by mouth daily.    . metFORMIN (GLUCOPHAGE-XR) 500 MG 24 hr tablet Take 2,000 mg by mouth daily.    . metoprolol (TOPROL-XL) 100 MG 24 hr tablet Take 150 mg by mouth every evening.     . metoprolol succinate (TOPROL-XL) 100 MG 24 hr tablet Take 200 mg by mouth daily. Take with or immediately following a meal.    . simvastatin (ZOCOR) 20 MG tablet Take 20 mg by mouth every evening.    . verapamil (CALAN-SR) 120 MG CR tablet Take 120 mg by mouth daily.       Discharge Medications: Please see discharge summary for a list of discharge medications.  Relevant Imaging Results:  Relevant Lab Results:   Additional [place], LCSW

## 2019-10-17 NOTE — ED Notes (Signed)
Pt attempting to leave ED. Security and [employer] assisting w/de-escalating pt. Pt noted to be irritable. Pt given injections - remained seated on bed - tolerated well.

## 2019-10-17 NOTE — ED Notes (Signed)
PT w/pt.  

## 2019-10-17 NOTE — ED Provider Notes (Signed)
ED psychiatric rounding note  86 year old female has been in this department approximately 9 hours.  She presented today under IVC by family member for hallucinations.  Patient medically cleared by previous team. Physical Exam  BP (!) 183/78 (BP Location: Right Arm)   Pulse 72   Temp 97.8 F (36.6 C) (Oral)   Resp 18   SpO2 98%   Physical Exam Constitutional:      General: She is not in acute distress.    Appearance: Normal appearance. She is not ill-appearing.  HENT:     Head: Normocephalic and atraumatic.     Right Ear: External ear normal.     Left Ear: External ear normal.  Eyes:     General: Vision grossly intact. Gaze aligned appropriately.  Pulmonary:     Effort: Pulmonary effort is normal. No respiratory distress.  Musculoskeletal:        General: Normal range of motion.     Cervical back: Normal range of motion.  Skin:    General: Skin is warm and dry.  Neurological:     Mental Status: She is alert.     GCS: GCS eye subscore is 4. GCS verbal subscore is 5. GCS motor subscore is 6.  Psychiatric:        Mood and Affect: Mood normal.        Speech: Speech normal.        Behavior: Behavior is cooperative.     ED Course/Procedures     Procedures  MDM  Lab review:  UDS within normal limits Covid/flu panel negative CBG [id number] Salicylate and Tylenol levels negative Ethanol level negative CBC nonacute, no evidence of anemia, no leukocytosis to suggest infection CMP with mild elevation of creatinine which appears baseline, no emergent electrolyte abnormalities, elevation of LFTs or sign of significant kidney injury Urinalysis without evidence of infection CT head:  IMPRESSION:  1. No acute intracranial abnormality.  2. Moderate atrophy and chronic small vessel ischemia.   - 8:23 AM: I evaluated the patient she is laying in bed no acute distress.  She sat up without assistance as I was talking to her.  She reports that she is feeling "fine".  She has not eaten  breakfast today and has asked me to provide her breakfast.  I informed nursing staff of her request.  She has no complaints or concerns at this time.  At this time there does not appear to be any evidence of an acute emergency medical condition and patient remains medically cleared.  Note: Portions of this report may have been transcribed using voice recognition software. Every effort was made to ensure accuracy; however, inadvertent computerized transcription errors may still be present.   Gari Crown 10/17/19 1052    Lucrezia Starch, MD 10/19/19 [phone]

## 2019-10-18 LAB — CBG MONITORING, ED: Glucose-Capillary: 231 mg/dL — ABNORMAL HIGH (ref 70–99)

## 2019-10-18 NOTE — NC FL2 (Signed)
[place] MEDICAID FL2 LEVEL OF CARE SCREENING TOOL     IDENTIFICATION  Patient Name: Kristen Bowman Birthdate: March 31, 1931 Sex: female Admission Date (Current Location): 10/16/2019  [facility] and [place] Number:  Herbalist and Address:  [facility] [place]. [facility], [place] [address], [place], [place] [address]      Provider Number: [id number]  Attending Physician Name and Address:  Default, Provider, MD  Relative Name and Phone Number:  Joyanne Bien [phone]    Current Level of Care: Hospital Recommended Level of Care: [place] Prior Approval Number:    Date Approved/Denied:   PASRR Number: PASSR under review  Discharge Plan: ICF    Current Diagnoses: Patient Active Problem List   Diagnosis Date Noted  . Dementia ([place]) 10/17/2019  . Pulmonary hypertension ([place]) 06/09/2014  . Tachycardia 09/24/2012  . Palpitations 09/24/2012  . CKD (chronic kidney disease), stage II 09/24/2012  . Anemia, iron deficiency 09/24/2012  . Hypertension   . Panic disorder   . Hyperlipidemia   . Type II or unspecified type diabetes mellitus with renal manifestations, uncontrolled(250.42) 05/29/2010    Orientation RESPIRATION BLADDER Height & Weight        Normal Continent Weight:   Height:     BEHAVIORAL SYMPTOMS/MOOD NEUROLOGICAL BOWEL NUTRITION STATUS  Physically abusive   Continent Diet(Carb modified with thin liquids)  AMBULATORY STATUS COMMUNICATION OF NEEDS Skin   Limited Assist Verbally                         Personal Care Assistance Level of Assistance  Bathing, Feeding, Dressing Bathing Assistance: Maximum assistance Feeding assistance: Limited assistance Dressing Assistance: Limited assistance     Functional Limitations Info             SPECIAL CARE FACTORS FREQUENCY  PT (By licensed PT), OT (By licensed OT)     PT Frequency: 5x weekly OT Frequency: 5x weekly            Contractures      Additional Factors Info   Code Status, Allergies Code Status Info: FULL Allergies Info: ACE inhibitors, lisinopril           Current Medications (10/18/2019):  This is the current hospital active medication list Current Facility-Administered Medications  Medication Dose Route Frequency Provider Last Rate Last Admin  . cholecalciferol (VITAMIN D3) tablet 2,000 Units  2,000 Units Oral Daily Montine Circle, PA-C      . clonazePAM Bobbye [place]) tablet 0.5 mg  0.5 mg Oral BID Montine Circle, PA-C   0.5 mg at 10/18/19 0214  . hydrochlorothiazide (HYDRODIURIL) tablet 25 mg  25 mg Oral Daily Montine Circle, PA-C   25 mg at 10/17/19 0451  . insulin aspart protamine- aspart (NOVOLOG MIX 70/30) injection 16 Units  16 Units Subcutaneous BID WC Montine Circle, PA-C   Stopped at 10/17/19 1847  . LORazepam (ATIVAN) tablet 1 mg  1 mg Oral Q6H PRN Money, Lowry Ram, FNP       Or  . LORazepam (ATIVAN) injection 1 mg  1 mg Intramuscular Q6H PRN Money, Lowry Ram, FNP   1 mg at 10/18/19 0215  . losartan (COZAAR) tablet 25 mg  25 mg Oral Daily Montine Circle, PA-C   25 mg at 10/17/19 0452  . metFORMIN (GLUCOPHAGE-XR) 24 hr tablet 2,000 mg  2,000 mg Oral Q breakfast Montine Circle, PA-C      . metoprolol succinate (TOPROL-XL) 24 hr tablet 150 mg  150 mg Oral QPM Montine Circle, PA-C   Stopped at 10/17/19 1847  . metoprolol succinate (TOPROL-XL) 24 hr tablet 200 mg  200 mg Oral Daily Montine Circle, PA-C   200 mg at 10/17/19 0451  . QUEtiapine (SEROQUEL) tablet 25 mg  25 mg Oral BID Money, Lowry Ram, FNP   25 mg at 10/18/19 0214  . simvastatin (ZOCOR) tablet 20 mg  20 mg Oral QPM Montine Circle, PA-C   Stopped at 10/17/19 1848  . verapamil (CALAN-SR) CR tablet 120 mg  120 mg Oral Daily Montine Circle, PA-C   120 mg at 10/17/19 [id number]   Current Outpatient Medications  Medication Sig Dispense Refill  . Cholecalciferol (VITAMIN D3) 50 MCG (2000 UT) TABS Take 2,000 Units by mouth every evening.    . clonazePAM (KLONOPIN) 0.5 MG  tablet Take 0.5 mg by mouth every evening.     . Cyanocobalamin (VITAMIN B-12 PO) Take 1 tablet by mouth daily after supper.    Marland Kitchen HUMALOG MIX 50/50 KWIKPEN (50-50) 100 UNIT/ML Kwikpen Inject 4-24 Units into the skin. Inject 24 units into the skin before breakfast, 4 units before lunch, and 20 units before evening meal and only if BGL is at least 100 ("otherwise, inject during or after the meal")    . ibuprofen (ADVIL) 200 MG tablet Take 200 mg by mouth every 6 (six) hours as needed for mild pain.    Marland Kitchen losartan (COZAAR) 100 MG tablet Take 100 mg by mouth daily.     . metFORMIN (GLUCOPHAGE-XR) 500 MG 24 hr tablet Take 500 mg by mouth daily.     . metoprolol succinate (TOPROL-XL) 100 MG 24 hr tablet Take 200 mg by mouth daily. Take with or immediately following a meal.    . simvastatin (ZOCOR) 20 MG tablet Take 20 mg by mouth every evening.       Discharge Medications: Please see discharge summary for a list of discharge medications.  Relevant Imaging Results:  Relevant Lab Results:   Additional Information  SSN: [id number]  *Patient will require 30 days of skilled services*  Archie Endo, LCSW

## 2019-10-18 NOTE — Progress Notes (Addendum)
9am: CSW received return call from patient's son Kristen Bowman to discuss discharge plan. Kristen Bowman and CSW discussed barriers to SNF placement - including the patient's IVC status and Air cabin crew. Kristen Bowman reports the patient does not have any DME at home. Kristen Bowman is agreeable to discharging home with home health services - no preference for agency.   ED RN made referral to [facility], referral under review.  7:50am: CSW uploaded clinicals into [place] MUST for PASSR review.  CSW attempted to reach patient's son Kristen Bowman at [phone] without success, a voicemail was left requesting a return call.  Madilyn Fireman, MSW, LCSW-A Transitions of Care  Clinical Social Worker  [facility] Emergency Departments  Medical ICU [phone]

## 2019-10-18 NOTE — ED Notes (Signed)
Pt currently sleeping supine in hospital bed. Equal rise and fall of chest noted. Sitter present.

## 2019-10-18 NOTE — ED Notes (Signed)
Ordered diet tray for pt  

## 2019-10-18 NOTE — ED Notes (Signed)
Pt woke up confuse and agitated, refusing to take her medication, Ativan 1 mg IM given by this RN to help her relax, and pt took her night time medication with apple sauce. Pt now resting on bed, sitter at the bedside.

## 2019-10-18 NOTE — ED Notes (Signed)
IVC Breakfast ordered 

## 2019-10-18 NOTE — ED Notes (Signed)
Pt to be discharged when Home Health has been set up. Per [place] SW, still waiting to hear back from [facility].

## 2019-10-18 NOTE — ED Notes (Signed)
RN attempted to give patient medications; once patient had meds in her mouth she begin to spit them out and swatted at the RN she then proceded to try to kick the RN; RN administered PRN medications after several attempts to redirect patient; patient continues to yell out "Daddy!"-Monique,RN

## 2019-10-18 NOTE — ED Notes (Signed)
Woke pt up to eat, states she wants to sleep for a little bit longer. Hold insulin until pt is ready to eat.

## 2019-10-18 NOTE — Discharge Planning (Signed)
EDCM consulted Amy Hyatt with [facility] to arrange home health services.  Will update team with results.

## 2019-10-19 LAB — CBG MONITORING, ED
Glucose-Capillary: 218 mg/dL — ABNORMAL HIGH (ref 70–99)
Glucose-Capillary: 150 mg/dL — ABNORMAL HIGH (ref 70–99)

## 2019-10-19 NOTE — Discharge Planning (Signed)
Efforts to obtain home health services continue.  Pt has been denied by [facility] and [place].

## 2019-10-19 NOTE — Discharge Planning (Signed)
EDCM discussed pt with bedside RN.  Bedside RN witnessed pt pocketing meds and spitting them out.  EDCM placed order for speech and swallow evaluation.

## 2019-10-19 NOTE — ED Notes (Signed)
IVC - Pending DC Breakfast ordered

## 2019-10-19 NOTE — ED Notes (Signed)
Dinner tray ordered.

## 2019-10-19 NOTE — ED Notes (Signed)
Lunch Tray Ordered @ 1021. 

## 2019-10-19 NOTE — ED Notes (Addendum)
PT IS UNABLE TO SWALLOW MEDS. SHE POCKETS THEM AND THEN SPITS THEM OUT. PILLS WILL NEED TO BE CRUSHED AND GIVEN WITH APPLESAUCE. ALSO, THIS MAY BE REASON PT "STOPPED TAKING" HER MEDS

## 2019-10-19 NOTE — ED Notes (Signed)
Dinner tray arrived 

## 2019-10-19 NOTE — ED Notes (Signed)
Pt up to restroom.

## 2019-10-19 NOTE — Progress Notes (Addendum)
CSW spoke with patient's son [name] to update him on the discharge plan. [name] agreeable to plan with discharge home with home health services. [name] reports he is seeking a personal care aide for the patient as well. CSW will return call once finalized plan has been determined.  Patient has been denied with [facility] due to lack of available staff.  Madilyn Fireman, MSW, LCSW-A Transitions of Care  Clinical Social Worker  [facility] Emergency Departments  Medical ICU [phone]

## 2019-10-20 NOTE — ED Notes (Signed)
Pt ambulated to the restroom with no issues 

## 2019-10-20 NOTE — ED Notes (Signed)
Lunch Tray Ordered @ 1049. °

## 2019-10-20 NOTE — ED Notes (Signed)
Pt currently working with physical therapy.

## 2019-10-20 NOTE — Progress Notes (Signed)
Physical Therapy Treatment Patient Details Name: Kristen Bowman MRN: [id number] DOB: [birth year] Today's Date: 10/20/2019    History of Present Illness 86 yo female brought to the ED for hallucinations, tendency to wander at night outside in the cold without coat; difficulty managing at home;  has a past medical history of Anemia, Anxiety states, Diabetes mellitus, Generalized osteoarthrosis, unspecified site, Hematuria, Hyperlipidemia, Hypertension, Irritable bowel syndrome, Other specified cardiac dysrhythmias(427.89), Panic disorder, Polymyalgia rheumatica ([place]), Proteinuria (05/29/10), and Type II or unspecified type diabetes mellitus with renal manifestations, uncontrolled(250.42) (05/29/10).    PT Comments    Pt resistive to OOB mobility this session, however, was agreeable to performing supine HEP. Required multimodal cues and assist for sequencing during exercises. Current recommendations appropriate, however, per notes, family may choose for pt to return home. Will require 24/7 supervision at d/c if pt to go home and max [facility] services. Will continue to follow acutely to maximize functional mobility independence and safety.      Follow Up Recommendations  SNF;Supervision/Assistance - 24 hour     Equipment Recommendations  Cane    Recommendations for Other Services       Precautions / Restrictions Precautions Precautions: Fall Restrictions Weight Bearing Restrictions: No    Mobility  Bed Mobility               General bed mobility comments: Attempted to sit at EOB, however, pt resistive and reports she was too tired. Was agreeable to supine HEP.   Transfers                    Ambulation/Gait                 Stairs             Wheelchair Mobility    Modified Rankin (Stroke Patients Only)       Balance                                            Cognition Arousal/Alertness: Awake/alert Behavior During Therapy: WFL for  tasks assessed/performed Overall Cognitive Status: No family/caregiver present to determine baseline cognitive functioning                                 General Comments: Pt asking where certain people were and was calling out to her son during session.       Exercises General Exercises - Upper Extremity Shoulder Flexion: AAROM;Both;5 reps;Supine General Exercises - Lower Extremity Ankle Circles/Pumps: Both;10 reps;AAROM;Supine Heel Slides: AAROM;Both;5 reps;Supine Hip ABduction/ADduction: AAROM;Both;5 reps;Supine Straight Leg Raises: AROM;Both;5 reps;Supine    General Comments        Pertinent Vitals/Pain Pain Assessment: No/denies pain    Home Living                      Prior Function            PT Goals (current goals can now be found in the care plan section) Acute Rehab PT Goals Patient Stated Goal: to go home PT Goal Formulation: With patient Time For Goal Achievement: 10/31/19 Potential to Achieve Goals: Good Progress towards PT goals: Progressing toward goals    Frequency    Min 2X/week      PT Plan Current plan remains appropriate  Co-evaluation              AM-PAC PT "6 Clicks" Mobility   Outcome Measure  Help needed turning from your back to your side while in a flat bed without using bedrails?: None Help needed moving from lying on your back to sitting on the side of a flat bed without using bedrails?: A Little Help needed moving to and from a bed to a chair (including a wheelchair)?: A Little Help needed standing up from a chair using your arms (e.g., wheelchair or bedside chair)?: A Little Help needed to walk in hospital room?: A Little Help needed climbing 3-5 steps with a railing? : A Lot 6 Click Score: 18    End of Session Equipment Utilized During Treatment: Gait belt Activity Tolerance: Patient tolerated treatment well Patient left: in bed;with call bell/phone within reach;with nursing/sitter in  room Nurse Communication: Mobility status PT Visit Diagnosis: Unsteadiness on feet (R26.81);Other symptoms and signs involving the nervous system (R29.898)     Time: [id number] PT Time Calculation (min) (ACUTE ONLY): 13 min  Charges:  $Therapeutic Exercise: 8-22 mins                     Lou Miner, DPT  Acute Rehabilitation Services  Pager: [phone] Office: [phone]    Rudean Hitt 10/20/2019, 5:41 PM

## 2019-10-20 NOTE — ED Notes (Signed)
IVC Breakfast ordered 

## 2019-10-21 LAB — CBG MONITORING, ED
Glucose-Capillary: 31 mg/dL — CL (ref 70–99)
Glucose-Capillary: 67 mg/dL — ABNORMAL LOW (ref 70–99)
Glucose-Capillary: 131 mg/dL — ABNORMAL HIGH (ref 70–99)
Glucose-Capillary: 279 mg/dL — ABNORMAL HIGH (ref 70–99)

## 2019-10-21 NOTE — ED Notes (Signed)
IVC Breakfast ordered 

## 2019-10-21 NOTE — ED Provider Notes (Signed)
ED OBSERVATION NOTE  The patient has been placed in psychiatric observation due to the need to provide a safe environment for the patient while obtaining psychiatric consultation and evaluation, as well as ongoing medical and medication management to treat the patient's condition.   At this time the plan tentatively appears to be to discharge home with appropriate home health, clinical social worker working on appropriate discharge services.  On exam this patient has normal vital signs, sleepy but arousable, appropriate per baseline.     Noemi Chapel, MD 10/21/19 [phone]

## 2019-10-21 NOTE — ED Notes (Signed)
Dinner Tray Ordered @ 1710. 

## 2019-10-21 NOTE — ED Notes (Signed)
Sitter obtained BS on patient due to some slight agitation while getting vitals; pt was A&Ox 1-2 at baseline; BS 31 and results given to Dr.Wickline; pt is alert enough to drink and eat and was given orange juice and gram crackers; pt was pleasant when RN assessed; BS will be rechecked per EDP-Monique,RN

## 2019-10-21 NOTE — Progress Notes (Signed)
   10/20/19 1300  SLP Visit Information  SLP Received On 10/20/19  General Information  HPI 86 year old female being held in ED.  Admitted by family member for hallucinations and wandering. She was noteiced to pocket or spit out large pills in ED. SLP consulted.   Type of Study Bedside Swallow Evaluation  Previous Swallow Assessment none  Diet Prior to this Study Regular;Thin liquids  Temperature Spikes Noted No  Respiratory Status Room air  History of Recent Intubation No  Behavior/Cognition Alert;Cooperative;Pleasant mood;Distractible  Oral Cavity Assessment WFL  Oral Care Completed by SLP No  Oral Cavity - Dentition Dentures, top;Dentures, bottom  Vision Functional for self-feeding  Self-Feeding Abilities Needs assist  Patient Positioning Upright in bed  Baseline Vocal Quality Normal  Volitional Cough Strong  Volitional Swallow Able to elicit  Oral Motor/Sensory Function  Overall Oral Motor/Sensory Function WFL  Ice Chips  Ice chips NT  Thin Liquid  Thin Liquid WFL  Nectar Thick Liquid  Nectar Thick Liquid NT  Honey Thick Liquid  Honey Thick Liquid NT  Puree  Puree WFL  Solid  Solid WFL  SLP Assessment  Clinical Impression Statement (ACUTE ONLY) Pt presents with no signs of aspiration or acute dysphagia. She was seen self feeding her lunch meal and was noted to ahve several behaviors associated with cognitive impairment; pt was easily distractible, picking at her clothes, looking at her hands, repeatedly looking at her receipt on the tray, all of which impacted her attention to her meal. Frequent verbal cues were needed to encourage pt to initiate self feeding. She also repeatedly attempted to use her knife to feed herself despite verbal cues to correct her. Her oral phase was adequate to masticate solids, but it was slow, with some disorganized bolus formation and rolling around of food. Pt can continue her regular diet and thin liquids, but will need pills crushed and would  benefit from supervision during meals to ensure adequate intake and attention during meal. Discussed with RN. Attempted to call pts son and daughter to give recommendations, no response. Will sign off at this time.   SLP Visit Diagnosis Dysphagia, unspecified (R13.10)  Impact on safety and function Mild aspiration risk  Swallow Evaluation Recommendations  SLP Diet Recommendations Regular;Thin liquid  Liquid Administration via Cup;Straw  Medication Administration Crushed with puree  Supervision Patient able to self feed;Intermittent supervision to cue for compensatory strategies  Compensations Minimize environmental distractions  Postural Changes Seated upright at 90 degrees  Treatment Plan  Follow up Recommendations 24 hour supervision/assistance  Individuals Consulted  Consulted and Agree with Results and Recommendations Patient  SLP Time Calculation  SLP Start Time (ACUTE ONLY) 1320  SLP Stop Time (ACUTE ONLY) 1345  SLP Time Calculation (min) (ACUTE ONLY) 25 min  SLP Evaluations  $ SLP Speech Visit 1 Visit  SLP Evaluations  $BSS Swallow 1 Procedure

## 2019-10-21 NOTE — Social Work (Signed)
[facility] faxed out IVC rescind paperwork.  Received confirmation from [facility] that Pt will be able to receive in-home physical therapy.  C son.SW informed Pt's son [name] via phone.   Pt's son will be here to pick up Pt around 9 pm this evening 10/21/19

## 2019-10-21 NOTE — ED Provider Notes (Addendum)
  Physical Exam  BP (!) 128/56 (BP Location: Right Arm)   Pulse 78   Temp 98.1 F (36.7 C) (Oral)   Resp 18   SpO2 93%   Physical Exam  ED Course/Procedures     Procedures  MDM  Asked to clear patient's IVC.  Reviewing records psychiatry saw patient and stated they no longer needed IVC 4 days ago.  Will change paperwork now       Davonna Belling, MD 10/21/19 1808  Patient has been cleared by social work and family is coming to pick her up.  It appears that more outpatient resources have been arranged.    Davonna Belling, MD 10/21/19 2029

## 2019-10-21 NOTE — ED Notes (Signed)
Lunch ordered 

## 2019-10-21 NOTE — Discharge Instructions (Signed)
[employer] [place] [phone] [email or web address]  [employer] is a Biomedical scientist that provides links to many resources throughout the community.   [employer]. Please Google this name to find many wonderful instructional videos for caregivers that provide tips and tricks for positive relationships with our loved ones who may be experiencing cognitive decline.

## 2019-10-21 NOTE — ED Notes (Signed)
Patient remains A&O x 1-2 at baseline and BS has come up to 67; pt given another orange juice; Staff will continue to [facility]

## 2019-10-26 ENCOUNTER — Telehealth: Payer: Self-pay | Admitting: Neurology

## 2019-10-26 NOTE — Telephone Encounter (Signed)
I have a cancellation if they can do a virtual video visit on Thurs at 10:30am, cannot be telephone, pls ensure they have video capability. So far no openings yet, if cannot do Thurs, try [facility] if they have any earlier appt? Thanks

## 2019-10-26 NOTE — Telephone Encounter (Signed)
Left message with the after hour service   Caller Kathlee Nations states patient just got out of the hospital and they need to be seen sooner than 12-02-19 they would like to speak to someone   Please call Kathlee Nations or the DR

## 2019-10-27 NOTE — Telephone Encounter (Signed)
Left a message for the patient to call back to see if she would like that appt

## 2019-10-28 ENCOUNTER — Encounter: Payer: Self-pay | Admitting: Neurology

## 2019-10-28 ENCOUNTER — Telehealth (INDEPENDENT_AMBULATORY_CARE_PROVIDER_SITE_OTHER): Payer: Medicare Other | Admitting: Neurology

## 2019-10-28 ENCOUNTER — Other Ambulatory Visit: Payer: Self-pay

## 2019-10-28 DIAGNOSIS — F0391 Unspecified dementia with behavioral disturbance: Secondary | ICD-10-CM

## 2019-10-28 DIAGNOSIS — F03B18 Unspecified dementia, moderate, with other behavioral disturbance: Secondary | ICD-10-CM

## 2019-10-28 DIAGNOSIS — Z9183 Wandering in diseases classified elsewhere: Secondary | ICD-10-CM | POA: Diagnosis not present

## 2019-10-28 MED ORDER — DIVALPROEX SODIUM 250 MG PO DR TAB: DELAYED_RELEASE_TABLET | ORAL | 5 refills | Status: DC

## 2019-10-28 NOTE — Progress Notes (Signed)
Virtual Visit via Video Note The purpose of this virtual visit is to provide medical care while limiting exposure to the novel coronavirus.    Consent was obtained for video visit:  Yes.   Answered questions that patient had about telehealth interaction:  Yes.   I discussed the limitations, risks, security and privacy concerns of performing an evaluation and management service by telemedicine. I also discussed with the patient that there may be a patient responsible charge related to this service. The patient expressed understanding and agreed to proceed.  Pt location: Home Physician Location: office Name of referring provider:  Mayra Neer, MD I connected with Eppie Gibson at patients initiation/request on 10/28/2019 at 10:30 AM EST by video enabled telemedicine application and verified that I am speaking with the correct person using two identifiers. Pt MRN:  [id number] Pt DOB:  04/16/1931 Video Participants:  Eppie Gibson;  Marrian Salvage (son)   History of Present Illness:  This is an 86 year old right-handed woman with a history of hypertension, hyperlipidemia, diabetes, PMR, presenting for evaluation of memory loss. She lives with her son [name]. Her daughter [name] lives next door. She feels her memory is not good, she does not remember a lot. [name] started noticing changes in 2019 while they were at an [employer] in [place]. Two days after they got home, she started saying that he had not taken her to the beach in a couple of years. He feels there was a big drastic change since then. He has been living with her for the past 12 years, making sure she eats and administering her insulin, but saying she was always pretty self-sufficient. In October 2020, he found her outside in the backyard, very confused, with a flipflop on one foot and a boot in another foot. She was hallucinating, thinking she was in [facility]. Since then, symptoms progressed that he felt she needed IVC last  10/16/19. She was wandering outside, talking to her deceased father. Prior to this, she went missing earlier in the week. She was not sleeping. There is no prior psychiatric history except for panic attacks in the 1990s. She has been taking clonazepam qhs for several years. This past weekend she would repeatedly say she is not home and she has to get out of there. They had to change the locks in the house to keep her inside. On Tuesday night she went outside with her daughter, she was out from 2pm until she got tired at 8pm. She states she still cooks, [name] fixes meals. [name] reports some paranoia. She stopped driving in September 2020. Her paternal aunt and father had memory issues. No significant head injuries or alcohol use. She denies any headaches, dizziness, diplopia, dysarthria/dysphagia, neck pain, focal numbness/tingling/weakness, no falls. She has occasional urinary incontinence. She needs assistance with bathing, getting confused with the water and temperature.    I personally reviewed head CT without contrast done 09/2019 which did not show any acute changes. There was moderate atrophy and mild chronic microvascular disease.   PAST MEDICAL HISTORY: Past Medical History:  Diagnosis Date  . Anemia   . Anxiety states   . Diabetes mellitus    T2DM with renal manifestations  . Generalized osteoarthrosis, unspecified site   . Hematuria   . Hyperlipidemia    mixed  . Hypertension   . Irritable bowel syndrome   . Other specified cardiac dysrhythmias(427.89)    atrial fibrilation  . Panic disorder   . Polymyalgia  rheumatica ([place])   . Proteinuria 05/29/10  . Type II or unspecified type diabetes mellitus with renal manifestations, uncontrolled(250.42) 05/29/10   CKD II (mild)    PAST SURGICAL HISTORY: Past Surgical History:  Procedure Laterality Date  . CATARACT EXTRACTION     right  . COLONOSCOPY N/A 11/04/2012   Procedure: COLONOSCOPY;  Surgeon: Arta Silence, MD;  Location: [facility]  ENDOSCOPY;  Service: Endoscopy;  Laterality: N/A;  . ESOPHAGOGASTRODUODENOSCOPY N/A 11/04/2012   Procedure: ESOPHAGOGASTRODUODENOSCOPY (EGD);  Surgeon: Arta Silence, MD;  Location: [facility];  Service: Endoscopy;  Laterality: N/A;  . TUBAL LIGATION      MEDICATIONS: Current Outpatient Medications on File Prior to Visit  Medication Sig Dispense Refill  . Cholecalciferol (VITAMIN D3) 50 MCG (2000 UT) TABS Take 2,000 Units by mouth every evening.    . clonazePAM (KLONOPIN) 0.5 MG tablet Take 0.5 mg by mouth every evening.     . Cyanocobalamin (VITAMIN B-12 PO) Take 1 tablet by mouth daily after supper.    Marland Kitchen HUMALOG MIX 50/50 KWIKPEN (50-50) 100 UNIT/ML Kwikpen Inject 4-24 Units into the skin. Inject 24 units into the skin before breakfast, 4 units before lunch, and 20 units before evening meal and only if BGL is at least 100 ("otherwise, inject during or after the meal")    . ibuprofen (ADVIL) 200 MG tablet Take 200 mg by mouth every 6 (six) hours as needed for mild pain.    Marland Kitchen losartan (COZAAR) 100 MG tablet Take 100 mg by mouth daily.     . metFORMIN (GLUCOPHAGE-XR) 500 MG 24 hr tablet Take 500 mg by mouth daily.     . metoprolol succinate (TOPROL-XL) 100 MG 24 hr tablet Take 200 mg by mouth daily. Take with or immediately following a meal.    . simvastatin (ZOCOR) 20 MG tablet Take 20 mg by mouth every evening.     No current facility-administered medications on file prior to visit.    ALLERGIES: Allergies  Allergen Reactions  . Ace Inhibitors Shortness Of Breath  . Lisinopril Shortness Of Breath  . Tape Other (See Comments)    SKIN BRUISES AND TEARS EASILY!!    FAMILY HISTORY: Family History  Problem Relation Age of Onset  . Diabetes Sister   . Cancer Other   . Heart attack Other   . Emphysema Mother   . Leukemia Father     Observations/Objective:   GEN:  The patient appears stated age and is in NAD.  Neurological examination: Patient is awake, alert, oriented to  person, year, state. No aphasia or dysarthria. Reduced fluency and comprehension. Remote and recent memory impaired. SLUMS score 8/30. [employer] Mental Exam 10/28/2019  Weekday Correct 0  Current year 1  What state are we in? 1  Amount spent 1  Amount left 0  # of Animals 2  5 objects recall 0  Number series 1  Hour markers 0  Time correct 0  Placed X in triangle correctly 1  Largest Figure 1  Name of female 0  Date back to work 0  Type of work 0  State she lived in 0  Total score 8   Cranial nerves: Extraocular movements intact with no nystagmus. No facial asymmetry. Motor: moves all extremities symmetrically, at least anti-gravity x 4. No incoordination on finger to nose testing. Gait: narrow-based and steady, no ataxia. Negative Romberg test.   Assessment and Plan:   This is an 86 year old right-handed woman with a history of hypertension, hyperlipidemia,  diabetes, PMR, presenting for evaluation of memory loss that started in 2019 with progressive worsening, now with hallucinations and wandering behavior. Neurological exam (although limited on video) is non-focal, SLUMS score 8/30. Head CT no acute changes, there is moderate diffuse atrophy. We discussed the diagnosis of dementia, likely due to Alzheimer's disease, with behavioral disturbance. We discussed medications used in dementia, such as Donepezil, these may help with behaviors, however her son is currently desperate for urgent help with behaviors. We discussed starting Depakote 250mg  qhs for mood stabilization, side effects discussed, we may uptitrate as tolerated. She now has home health involved, we discussed that with home safety concerns, would start looking into Memory Care, resources will be provided. She does not drive. Continue close supervision. Follow-up in 6 months, they know to call for any changes.    Follow Up Instructions:   -I discussed the assessment and treatment plan with the patient. The patient was  provided an opportunity to ask questions and all were answered. The patient agreed with the plan and demonstrated an understanding of the instructions.   The patient was advised to call back or seek an in-person evaluation if the symptoms worsen or if the condition fails to improve as anticipated.   Cameron Sprang, MD

## 2019-11-04 ENCOUNTER — Ambulatory Visit: Payer: Medicare Other | Attending: Internal Medicine

## 2019-11-12 ENCOUNTER — Emergency Department (HOSPITAL_COMMUNITY)
Admission: EM | Admit: 2019-11-12 | Discharge: 2019-11-20 | Disposition: A | Discharge status: Other Acute Inpatient Hospital | Payer: Medicare Other | Attending: Emergency Medicine | Admitting: Emergency Medicine

## 2019-11-12 ENCOUNTER — Encounter (HOSPITAL_COMMUNITY): Payer: Self-pay

## 2019-11-12 DIAGNOSIS — Z794 Long term (current) use of insulin: Secondary | ICD-10-CM | POA: Insufficient documentation

## 2019-11-12 DIAGNOSIS — N182 Chronic kidney disease, stage 2 (mild): Secondary | ICD-10-CM | POA: Insufficient documentation

## 2019-11-12 DIAGNOSIS — R4689 Other symptoms and signs involving appearance and behavior: Secondary | ICD-10-CM | POA: Insufficient documentation

## 2019-11-12 DIAGNOSIS — I129 Hypertensive chronic kidney disease with stage 1 through stage 4 chronic kidney disease, or unspecified chronic kidney disease: Secondary | ICD-10-CM | POA: Insufficient documentation

## 2019-11-12 DIAGNOSIS — Z20822 Contact with and (suspected) exposure to covid-19: Secondary | ICD-10-CM | POA: Insufficient documentation

## 2019-11-12 DIAGNOSIS — G309 Alzheimer's disease, unspecified: Secondary | ICD-10-CM | POA: Diagnosis not present

## 2019-11-12 DIAGNOSIS — Z79899 Other long term (current) drug therapy: Secondary | ICD-10-CM | POA: Diagnosis not present

## 2019-11-12 DIAGNOSIS — E1122 Type 2 diabetes mellitus with diabetic chronic kidney disease: Secondary | ICD-10-CM | POA: Diagnosis not present

## 2019-11-12 DIAGNOSIS — R451 Restlessness and agitation: Secondary | ICD-10-CM | POA: Insufficient documentation

## 2019-11-12 DIAGNOSIS — G3184 Mild cognitive impairment, so stated: Secondary | ICD-10-CM | POA: Diagnosis not present

## 2019-11-12 DIAGNOSIS — Z046 Encounter for general psychiatric examination, requested by authority: Secondary | ICD-10-CM | POA: Diagnosis present

## 2019-11-12 LAB — CBC
HCT: 41 % (ref 36.0–46.0)
Hemoglobin: 13.2 g/dL (ref 12.0–15.0)
MCH: 29.6 pg (ref 26.0–34.0)
MCHC: 32.2 g/dL (ref 30.0–36.0)
MCV: 91.9 fL (ref 80.0–100.0)
Platelets: 155 10*3/uL (ref 150–400)
RBC: 4.46 MIL/uL (ref 3.87–5.11)
RDW: 13.6 % (ref 11.5–15.5)
WBC: 5.2 10*3/uL (ref 4.0–10.5)
nRBC: 0 % (ref 0.0–0.2)

## 2019-11-12 LAB — COMPREHENSIVE METABOLIC PANEL
ALT: 16 U/L (ref 0–44)
AST: 19 U/L (ref 15–41)
Albumin: 3.4 g/dL — ABNORMAL LOW (ref 3.5–5.0)
Alkaline Phosphatase: 68 U/L (ref 38–126)
Anion gap: 13 (ref 5–15)
BUN: 10 mg/dL (ref 8–23)
CO2: 25 mmol/L (ref 22–32)
Calcium: 8.9 mg/dL (ref 8.9–10.3)
Chloride: 106 mmol/L (ref 98–111)
Creatinine, Ser: 1.2 mg/dL — ABNORMAL HIGH (ref 0.44–1.00)
GFR calc Af Amer: 47 mL/min — ABNORMAL LOW (ref >60)
GFR calc non Af Amer: 41 mL/min — ABNORMAL LOW (ref >60)
Glucose, Bld: 92 mg/dL (ref 70–99)
Potassium: 3.5 mmol/L (ref 3.5–5.1)
Sodium: 144 mmol/L (ref 135–145)
Total Bilirubin: 1 mg/dL (ref 0.3–1.2)
Total Protein: 6.6 g/dL (ref 6.5–8.1)
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
## 2019-11-13 ENCOUNTER — Other Ambulatory Visit: Payer: Self-pay

## 2019-11-13 ENCOUNTER — Emergency Department (HOSPITAL_COMMUNITY): Payer: Medicare Other

## 2019-11-13 LAB — URINALYSIS, ROUTINE W REFLEX MICROSCOPIC
Bilirubin Urine: NEGATIVE
Glucose, UA: NEGATIVE mg/dL
Hgb urine dipstick: NEGATIVE
Ketones, ur: NEGATIVE mg/dL
Leukocytes,Ua: NEGATIVE
Nitrite: NEGATIVE
Protein, ur: NEGATIVE mg/dL
Specific Gravity, Urine: 1.009 (ref 1.005–1.030)
pH: 7 (ref 5.0–8.0)

## 2019-11-13 LAB — CBG MONITORING, ED
Glucose-Capillary: 248 mg/dL — ABNORMAL HIGH (ref 70–99)
Glucose-Capillary: 144 mg/dL — ABNORMAL HIGH (ref 70–99)
Glucose-Capillary: 121 mg/dL — ABNORMAL HIGH (ref 70–99)
Glucose-Capillary: 192 mg/dL — ABNORMAL HIGH (ref 70–99)

## 2019-11-13 LAB — RESPIRATORY PANEL BY RT PCR (FLU A&B, COVID)
Influenza A by PCR: NEGATIVE
Influenza B by PCR: NEGATIVE
SARS Coronavirus 2 by RT PCR: NEGATIVE

## 2019-11-13 MED ORDER — OLANZAPINE 10 MG IM SOLR: 5.0000 mg | Freq: Once | INTRAMUSCULAR | Status: DC

## 2019-11-13 MED ORDER — METFORMIN HCL ER 500 MG PO TB24
500.0000 mg | ORAL_TABLET | Freq: Every day | ORAL | Status: DC
  Administered 2019-11-13 – 2019-11-19 (×4): 500 mg via ORAL
  Filled 2019-11-13 (×9): qty 1

## 2019-11-13 MED ORDER — ACETAMINOPHEN 325 MG PO TABS: 650.0000 mg | ORAL_TABLET | ORAL | Status: DC | PRN

## 2019-11-13 MED ORDER — METOPROLOL SUCCINATE ER 25 MG PO TB24
200.0000 mg | ORAL_TABLET | Freq: Every day | ORAL | Status: DC
  Administered 2019-11-13 – 2019-11-19 (×4): 200 mg via ORAL
  Filled 2019-11-13 (×3): qty 8
  Filled 2019-11-13: qty 2
  Filled 2019-11-13 (×3): qty 8

## 2019-11-13 MED ORDER — OLANZAPINE 10 MG IM SOLR
5.0000 mg | Freq: Once | INTRAMUSCULAR | Status: AC
  Administered 2019-11-13: 5 mg via INTRAMUSCULAR
  Filled 2019-11-13: qty 10

## 2019-11-13 MED ORDER — OLANZAPINE 10 MG IM SOLR
2.5000 mg | Freq: Once | INTRAMUSCULAR | Status: AC
  Administered 2019-11-13: 2.5 mg via INTRAMUSCULAR
  Filled 2019-11-13: qty 10

## 2019-11-13 MED ORDER — SIMVASTATIN 20 MG PO TABS
20.0000 mg | ORAL_TABLET | Freq: Every evening | ORAL | Status: DC
  Administered 2019-11-14 – 2019-11-19 (×5): 20 mg via ORAL
  Filled 2019-11-13 (×7): qty 1

## 2019-11-13 MED ORDER — CLONAZEPAM 0.5 MG PO TABS
0.5000 mg | ORAL_TABLET | Freq: Every evening | ORAL | Status: DC
  Administered 2019-11-14: 0.5 mg via ORAL
  Filled 2019-11-13 (×3): qty 1

## 2019-11-13 MED ORDER — DIVALPROEX SODIUM 250 MG PO DR TAB
250.0000 mg | DELAYED_RELEASE_TABLET | Freq: Every day | ORAL | Status: DC
  Filled 2019-11-13: qty 1

## 2019-11-13 MED ORDER — HYDRALAZINE HCL 20 MG/ML IJ SOLN: 10.0000 mg | Freq: Once | INTRAMUSCULAR | Status: DC

## 2019-11-13 MED ORDER — INSULIN ASPART 100 UNIT/ML ~~LOC~~ SOLN
0.0000 [IU] | Freq: Three times a day (TID) | SUBCUTANEOUS | Status: DC
  Administered 2019-11-13: 2 [IU] via SUBCUTANEOUS
  Administered 2019-11-13: 5 [IU] via SUBCUTANEOUS
  Administered 2019-11-14: 3 [IU] via SUBCUTANEOUS
  Administered 2019-11-14 (×2): 5 [IU] via SUBCUTANEOUS
  Administered 2019-11-15 – 2019-11-16 (×2): 15 [IU] via SUBCUTANEOUS
  Administered 2019-11-16: 4 [IU] via SUBCUTANEOUS

## 2019-11-13 MED ORDER — INSULIN ASPART 100 UNIT/ML ~~LOC~~ SOLN: 0.0000 [IU] | Freq: Every day | SUBCUTANEOUS | Status: DC

## 2019-11-13 MED ORDER — OLANZAPINE 10 MG IM SOLR
2.5000 mg | Freq: Once | INTRAMUSCULAR | Status: AC
  Administered 2019-11-13: 2.5 mg via INTRAMUSCULAR

## 2019-11-13 MED ORDER — CLONAZEPAM 0.5 MG PO TABS
1.0000 mg | ORAL_TABLET | Freq: Every day | ORAL | Status: DC
  Administered 2019-11-13: 0.5 mg via ORAL
  Administered 2019-11-14: 1 mg via ORAL
  Filled 2019-11-13 (×2): qty 2

## 2019-11-13 MED ORDER — LOSARTAN POTASSIUM 50 MG PO TABS
100.0000 mg | ORAL_TABLET | Freq: Every day | ORAL | Status: DC
  Administered 2019-11-13 – 2019-11-19 (×5): 100 mg via ORAL
  Filled 2019-11-13 (×7): qty 2

## 2019-11-13 NOTE — ED Notes (Signed)
Pt is no longer redirectable, became violent with staff, attempting to elope. EDP notifed. Zyprexa administered, ineffective. Violent restraint protocol initiated by Pollina MD for safety concerns.

## 2019-11-13 NOTE — Progress Notes (Addendum)
Patient meets criteria for inpatient treatment per Ricky Ala, NP. No appropriate beds at [facility] currently. CSW faxed referrals to the following facilities for review:  [facility]  [facility]  [facility]  [facility]  TTS will continue to seek bed placement.     Darletta Moll MSW, [place] Worker Disposition  [facility] Ph: [phone] Fax: [phone] 11/13/2019 10:47 AM

## 2019-11-13 NOTE — ED Notes (Signed)
Restraints removed from pt at 1415. Pt noted to be sleeping at this time.

## 2019-11-13 NOTE — ED Notes (Addendum)
Restraints applied to pt - bil wrist/ankle. Messaged BH NP for med recommendations.

## 2019-11-13 NOTE — BHH Counselor (Signed)
TTS attempting to get collateral from patient's daughter or son. Called both and left messages.

## 2019-11-13 NOTE — ED Notes (Signed)
Pt awake - assisted pt w/moving to bed from stretcher for comfort. PureWick remains intact.

## 2019-11-13 NOTE — ED Notes (Signed)
Pt noted to be moving about when attempting to obtain VS's.
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
## 2019-11-13 NOTE — ED Notes (Addendum)
Pt arrived to Rm 49 via stretcher. Pt noted to be dressed in hospital gown. Restraints intact to pt. Restraint order changed from Violent to Non-Violent per Benancio Deeds, RN. Pt noted to be alert - ate most of pudding. Took Klonopin 0.5mg  po w/pudding. Pt's belongings placed at desk for inventory. No Sitter available for per Staffing Office - J Blue-Matthews, Agricultural consultant, aware.

## 2019-11-13 NOTE — ED Notes (Signed)
Lunch Tray Ordered @ 1139. 

## 2019-11-13 NOTE — ED Notes (Signed)
Care endorsed to [facility] [place]

## 2019-11-13 NOTE — ED Notes (Signed)
This tech fed pt pudding. Pt ate about 50% of pudding cup.  New purewick is in place and hooked to suction canister.

## 2019-11-13 NOTE — ED Notes (Signed)
Ordered Breakfast tray

## 2019-11-13 NOTE — ED Notes (Signed)
Pt continues to yell out at times and pull at restraints. Pt continues to refuse to eat/drink.

## 2019-11-13 NOTE — ED Notes (Signed)
Dinner tray delivered - pt refusing to eat even after much encouragement.

## 2019-11-13 NOTE — ED Notes (Signed)
Belongings have been inventoried and are in locker #3 in purple zone.

## 2019-11-13 NOTE — BHH Counselor (Addendum)
Disposition:    Ricky Ala, NP, recommend geropsychiatric services

## 2019-11-13 NOTE — ED Notes (Signed)
Pt noted to be attempting to get up out of bed - placing legs over rails. Pt is not re-directable. Pt noted to be talking loudly. Attempted to orient pt - unsuccessful. Pt calling out "Daddy!" Message sent to Dr Francia Greaves.

## 2019-11-14 LAB — CBG MONITORING, ED
Glucose-Capillary: 224 mg/dL — ABNORMAL HIGH (ref 70–99)
Glucose-Capillary: 188 mg/dL — ABNORMAL HIGH (ref 70–99)
Glucose-Capillary: 223 mg/dL — ABNORMAL HIGH (ref 70–99)
Glucose-Capillary: 183 mg/dL — ABNORMAL HIGH (ref 70–99)

## 2019-11-14 MED ORDER — CLONAZEPAM 0.5 MG PO TABS
0.5000 mg | ORAL_TABLET | Freq: Once | ORAL | Status: AC
  Administered 2019-11-14: 0.5 mg via ORAL
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
## 2019-11-14 NOTE — ED Notes (Signed)
[facility]-Psych   Breakfast ordered

## 2019-11-14 NOTE — ED Notes (Addendum)
Pt.s breakfast has arrived. Took pt.s [id number]. Pt still sleeping, will attempt to feed later.

## 2019-11-14 NOTE — BHH Counselor (Signed)
Pt remains at ED due to aggressive behavior.  Pt is in restraints while sleeping, and per attending nurse, while she is awake, she tries to remove them and elope.  Pt was dozing at start of assessment, and she made no eye contact.  Unclear whether she was alert during assessment.  She made slightly perceptible head nods when asked if she knew where she was.  Otherwise, Pt did not respond.  Recommend continued inpatient, further assessment.
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
## 2019-11-14 NOTE — ED Provider Notes (Signed)
Patient's labs are unremarkable.blood pressure is elevated, will need to restart her home antihypertensive medications.  She is medically cleared for psychiatric treatment.   Delora Fuel, MD [phone] [address] Kief

## 2019-11-14 NOTE — Progress Notes (Signed)
CSW re-faxed referral to [facility].  Darletta Moll MSW, [place] Worker Disposition  [facility] Ph: [phone] Fax: [phone]

## 2019-11-14 NOTE — ED Notes (Signed)
Pt noted to be sleeping - wakes and attempts to get up intermittently.

## 2019-11-14 NOTE — ED Notes (Signed)
Pt noted to be sleeping - Respirations even, unlabored. Restraints removed at 0700. Sitter w/pt.
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
## 2019-11-14 NOTE — ED Notes (Signed)
Pt refused to eat breakfast. Pt took meds w/ice cream.
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
## 2019-11-14 NOTE — ED Notes (Signed)
Pt woke up, this writer attempted to feed pt. Pt would move her face away from the spoon and asked for "mama" or "Danny". Will continue to monitor pt.
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
## 2019-11-14 NOTE — ED Notes (Signed)
Pt talking to TTS now.

## 2019-11-15 ENCOUNTER — Emergency Department (HOSPITAL_COMMUNITY): Payer: Medicare Other

## 2019-11-15 ENCOUNTER — Telehealth: Payer: Self-pay

## 2019-11-15 LAB — RAPID URINE DRUG SCREEN, HOSP PERFORMED
Amphetamines: NOT DETECTED
Barbiturates: NOT DETECTED
Benzodiazepines: NOT DETECTED
Cocaine: NOT DETECTED
Opiates: NOT DETECTED
Tetrahydrocannabinol: NOT DETECTED

## 2019-11-15 LAB — CBG MONITORING, ED
Glucose-Capillary: 424 mg/dL — ABNORMAL HIGH (ref 70–99)
Glucose-Capillary: 237 mg/dL — ABNORMAL HIGH (ref 70–99)

## 2019-11-15 MED ORDER — CLONAZEPAM 0.5 MG PO TABS: 1.0000 mg | ORAL_TABLET | Freq: Two times a day (BID) | ORAL | Status: DC | PRN

## 2019-11-15 MED ORDER — INSULIN ASPART PROT & ASPART (70-30 MIX) 100 UNIT/ML ~~LOC~~ SUSP
24.0000 [IU] | Freq: Every day | SUBCUTANEOUS | Status: DC
  Administered 2019-11-16: 24 [IU] via SUBCUTANEOUS
  Filled 2019-11-15: qty 10

## 2019-11-15 MED ORDER — DIVALPROEX SODIUM 250 MG PO DR TAB
250.0000 mg | DELAYED_RELEASE_TABLET | Freq: Two times a day (BID) | ORAL | Status: DC
  Administered 2019-11-15 – 2019-11-19 (×8): 250 mg via ORAL
  Filled 2019-11-15 (×10): qty 1

## 2019-11-15 MED ORDER — RISPERIDONE 0.5 MG PO TABS
0.5000 mg | ORAL_TABLET | Freq: Two times a day (BID) | ORAL | Status: DC
  Administered 2019-11-15 – 2019-11-17 (×4): 0.5 mg via ORAL
  Filled 2019-11-15 (×5): qty 1

## 2019-11-15 MED ORDER — INSULIN ASPART PROT & ASPART (70-30 MIX) 100 UNIT/ML ~~LOC~~ SUSP
4.0000 [IU] | Freq: Every day | SUBCUTANEOUS | Status: DC
  Filled 2019-11-15: qty 10

## 2019-11-15 MED ORDER — INSULIN ASPART PROT & ASPART (70-30 MIX) 100 UNIT/ML ~~LOC~~ SUSP: 20.0000 [IU] | Freq: Every day | SUBCUTANEOUS | Status: DC

## 2019-11-15 NOTE — Progress Notes (Signed)
CSW received a phone call from [place] asking for updated vitals. CSW faxed over requested vitals to [phone]. [facility] reviewing for possible bed placement.   Domenic Schwab, MSW, LCSW-A Clinical Disposition Social Worker [facility]/TTS [phone]

## 2019-11-15 NOTE — BH Assessment (Signed)
Reassessment 11/15/19:  Patient was alert during the assessment. She was observed to be cooperative. She answered some questions but some were difficult to understand. Example: Patient stated, "I'm going to the barrels". Counselor unsure of what she meant. Patient asked about her mood and increased agitation. She did acknowledge her irritability but was not able to provide an explanation. She stated that she has been medication compliant. She reports that her sleep and appetite are ok. Patient did not express any thoughts to harm self or others. She was cooperative.   Nursing staff report a change in behaviors around noon and 3pm. Those behaviors include combative behaviors, yelling, verbal insults, and hallucinations. Patient also noted to spit her medications out.   Discussed changes with patient's behaviors observed in the ED. Per Tinnie Gens, continue with [facility] psych placement.
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
## 2019-11-15 NOTE — Discharge Planning (Signed)
TTS evaluation determined patient met criteria for  Inpatient behavioral health placement. Patient faxed out awaiting available bed at a facility.
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
## 2019-11-15 NOTE — ED Notes (Signed)
meds crushed and placed in applesauce. Pt took some and spit out rest. Pt was combative and hallucinating and yelling verbal insults.

## 2019-11-15 NOTE — ED Notes (Signed)
Lunch Tray Ordered @ 1055. 

## 2019-11-15 NOTE — ED Notes (Signed)
Pt's restraints discontinued at this time. PT drinking water. She gave verbal agreement to understanding why restraints removed and how to keep them off. Skin intact under restraints.

## 2019-11-16 ENCOUNTER — Other Ambulatory Visit: Payer: Self-pay

## 2019-11-16 LAB — CBG MONITORING, ED
Glucose-Capillary: 413 mg/dL — ABNORMAL HIGH (ref 70–99)
Glucose-Capillary: 442 mg/dL — ABNORMAL HIGH (ref 70–99)
Glucose-Capillary: 255 mg/dL — ABNORMAL HIGH (ref 70–99)
Glucose-Capillary: 99 mg/dL (ref 70–99)
Glucose-Capillary: 195 mg/dL — ABNORMAL HIGH (ref 70–99)

## 2019-11-16 MED ORDER — INSULIN NPH (HUMAN) (ISOPHANE) 100 UNIT/ML ~~LOC~~ SUSP
14.0000 [IU] | Freq: Every day | SUBCUTANEOUS | Status: DC
  Administered 2019-11-16 – 2019-11-19 (×4): 14 [IU] via SUBCUTANEOUS
  Filled 2019-11-16 (×2): qty 10

## 2019-11-16 MED ORDER — INSULIN NPH (HUMAN) (ISOPHANE) 100 UNIT/ML ~~LOC~~ SUSP
17.0000 [IU] | Freq: Every day | SUBCUTANEOUS | Status: DC
  Administered 2019-11-17 – 2019-11-20 (×4): 17 [IU] via SUBCUTANEOUS
  Filled 2019-11-16: qty 10

## 2019-11-16 MED ORDER — INSULIN ASPART 100 UNIT/ML ~~LOC~~ SOLN
4.0000 [IU] | Freq: Three times a day (TID) | SUBCUTANEOUS | Status: DC
  Administered 2019-11-17 (×2): 4 [IU] via SUBCUTANEOUS

## 2019-11-16 NOTE — ED Notes (Addendum)
Pt noted to be irritable - refusing to eat dinner and refusing med after much encouragement. Pt noted to be restless and cursing.

## 2019-11-16 NOTE — Progress Notes (Signed)
CSW heard back from [place] at [facility]. They are interested in taking her but their admitting physician wants to see the patient's blood sugars below 250 for at least 24 hours before they will admit her.   CSW will follow up with Caryl Pina tomorrow and provide updated blood sugars. CSW will continue to follow and assist with disposition planning.   Domenic Schwab, MSW, LCSW-A Clinical Disposition Social Worker [facility]/TTS [phone]

## 2019-11-16 NOTE — Progress Notes (Signed)
CSW called and spoke with Caryl Pina at [facility]. She still had not received requested additional documents. CSW provided documents over e-fax.   CSW is awaiting a phone call from [place].   Domenic Schwab, MSW, LCSW-A Clinical Disposition Social Worker [facility]/TTS [phone]

## 2019-11-16 NOTE — Progress Notes (Signed)
[facility] requesting MAR and nursing notes from the past 24 hours. CSW faxed documents as requested.   Audree Camel, LCSW, [place] Disposition [facility]/TTS [phone]

## 2019-11-16 NOTE — ED Notes (Addendum)
Pt sleeping soundly. No distress noted.

## 2019-11-16 NOTE — ED Notes (Signed)
Pt refused VS. Kicked at this RN.

## 2019-11-16 NOTE — Progress Notes (Signed)
IVC paperwork faxed to [facility] at their request.  Rhae [place]ock Disposition CSW [facility]/TTS [phone]

## 2019-11-16 NOTE — ED Notes (Signed)
Son, Asherah Shilts, called inquiring about placement. Advised [facility] reviewing. Voiced understanding. Requested to be called with updates.

## 2019-11-16 NOTE — Progress Notes (Signed)
Inpatient Diabetes Program Recommendations  AACE/ADA: New Consensus Statement on Inpatient Glycemic Control (2015)  Target Ranges:  Prepandial:   less than 140 mg/dL      Peak postprandial:   less than 180 mg/dL (1-2 hours)      Critically ill patients:  140 - 180 mg/dL   Lab Results  Component Value Date   GLUCAP 255 (H) 11/16/2019   HGBA1C 7.0 (H) 09/25/2012    Review of Glycemic Control  Diabetes history: DM 2 Outpatient Diabetes medications: Humalog 50/50 24 units qam, 4 units lunch, 20 units qpm, Metformin 500 mg Daily Current orders for Inpatient glycemic control:  70/30 24 units qam, 4 units lunch, 20 units qpm Novolog 0-15 units tid + hs  Inpatient Diabetes Program Recommendations:    Consider:  D/c 70/30 doses  - NPH 17 units qam - NPH 14 units qpm - Novolog 4 units tid meal coverage with parameters (give if pt eating at least 50% of meals and glucose is at least 80 mg/dl)  Thanks,  Tama Headings RN, MSN, BC-ADM Inpatient Diabetes Coordinator Team Pager [phone] (8a-5p)

## 2019-11-16 NOTE — ED Notes (Signed)
Pt is awake now - noted to be calm, pleasant. Eating ice cream.

## 2019-11-16 NOTE — ED Notes (Signed)
Pt attempting to get out of bed multiple times. Bed alarm set. Gave pt towels to fold to keep her busy. Pt pleasant at this time. Refuses food and drink.

## 2019-11-16 NOTE — ED Notes (Signed)
Pt sleeping soundly. No distress noted.

## 2019-11-16 NOTE — Telephone Encounter (Signed)
Pls let son know I have reviewed hospital notes and will await records once she is discharged from [facility] as to what medications are needed. Thanks

## 2019-11-16 NOTE — ED Notes (Addendum)
Per Dr Lafayette Dragon, administer Novolog 15 units and Novolog 70/30 24 units as ordered. Pt refusing po meds and is refusing to eat breakfast. Pt noted to be lying on bed w/eyes closed. Respirations even, unlabored. Wakens easily then returns to sleeping.

## 2019-11-16 NOTE — Progress Notes (Signed)
Inpatient Diabetes Program Recommendations  AACE/ADA: New Consensus Statement on Inpatient Glycemic Control (2015)  Target Ranges:  Prepandial:   less than 140 mg/dL      Peak postprandial:   less than 180 mg/dL (1-2 hours)      Critically ill patients:  140 - 180 mg/dL   Lab Results  Component Value Date   GLUCAP 442 (H) 11/16/2019   HGBA1C 7.0 (H) 09/25/2012    Review of Glycemic Control  Diabetes history: DM 2 Outpatient Diabetes medications: Humalog 50/50 24 units qam, 4 units lunch, 20 units qpm, Metformin 500 mg Daily Current orders for Inpatient glycemic control:  70/30 24 units qam, 4 units lunch, 20 units qpm Novolog 0-15 units tid + hs  Inpatient Diabetes Program Recommendations:    Noted glucose 400's this am, Noted last night, RN held 70/30 insulin without calling MD due to pt not eating dinner. Pt to get Novolog bolus and normal 70/30 dose. Will follow trends today. If PO intake is poor may want to change 70/30 to NPH and Novolog meal coverage dosing.  Thanks,  Tama Headings RN, MSN, BC-ADM Inpatient Diabetes Coordinator Team Pager [phone] (8a-5p)

## 2019-11-16 NOTE — ED Notes (Signed)
[facility]-Psych   Breakfast ordered

## 2019-11-16 NOTE — ED Notes (Signed)
Pt in room cursing at this RN. Pt was fine 15 minutes ago. Pt refusing vital signs. Will try again shortly.

## 2019-11-16 NOTE — ED Notes (Signed)
Patient A&O to self; patient is calm and RN was able to get meds in crushed in 1 whole container of ice cream.  Patient continues to have [place]; patient taking to unseen person in the room named "Diane".-Monique,RN

## 2019-11-16 NOTE — ED Notes (Addendum)
Pt noted to be moving about on stretcher intermittently. Continues to be sleeping. Respirations even, unlabored. Pt's CBG 255 - administered Novolog 4 units SQ per Dr Maryan Rued and held 70/30. DM Coordinator assisting.

## 2019-11-16 NOTE — Telephone Encounter (Signed)
Called pt son to lt him  know Dr Delice Lesch has reviewed hospital notes and will await records once she is discharged from [facility] as to what medications are needed

## 2019-11-16 NOTE — ED Notes (Signed)
Re-TTS being performed.  

## 2019-11-16 NOTE — ED Notes (Signed)
Message sent to Dr Maryan Rued re: pt's CBG 442.

## 2019-11-16 NOTE — BH Assessment (Signed)
Reassessment Note: Pt presents lying in bed. She is smiling & states she is feeling better and "wonderful". Pt denies SI, HI and AVH. Pt was complimented on her long legs. She said thank you and that she was 8 feet 7 inches tall. Inpt tx continues to be recommendation

## 2019-11-17 LAB — CBG MONITORING, ED
Glucose-Capillary: 455 mg/dL — ABNORMAL HIGH (ref 70–99)
Glucose-Capillary: 390 mg/dL — ABNORMAL HIGH (ref 70–99)
Glucose-Capillary: 290 mg/dL — ABNORMAL HIGH (ref 70–99)
Glucose-Capillary: 164 mg/dL — ABNORMAL HIGH (ref 70–99)

## 2019-11-17 MED ORDER — RISPERIDONE 1 MG PO TBDP: 0.5000 mg | ORAL_TABLET | Freq: Two times a day (BID) | ORAL | Status: DC

## 2019-11-17 MED ORDER — RISPERIDONE 1 MG PO TBDP
0.5000 mg | ORAL_TABLET | Freq: Two times a day (BID) | ORAL | Status: DC
  Administered 2019-11-17 – 2019-11-19 (×6): 0.5 mg via ORAL
  Filled 2019-11-17 (×6): qty 1

## 2019-11-17 MED ORDER — OLANZAPINE 10 MG IM SOLR: 2.5000 mg | Freq: Two times a day (BID) | INTRAMUSCULAR | Status: DC | PRN

## 2019-11-17 MED ORDER — RISPERIDONE 1 MG PO TABS: 1.0000 mg | ORAL_TABLET | Freq: Two times a day (BID) | ORAL | Status: DC

## 2019-11-17 MED ORDER — OLANZAPINE 10 MG IM SOLR: 2.5000 mg | Freq: Two times a day (BID) | INTRAMUSCULAR | Status: DC

## 2019-11-17 MED ORDER — LORAZEPAM 0.5 MG PO TABS
0.5000 mg | ORAL_TABLET | ORAL | Status: DC | PRN
  Administered 2019-11-17 – 2019-11-19 (×2): 0.5 mg via ORAL
  Filled 2019-11-17 (×2): qty 1

## 2019-11-17 MED ORDER — INSULIN ASPART 100 UNIT/ML ~~LOC~~ SOLN
4.0000 [IU] | Freq: Three times a day (TID) | SUBCUTANEOUS | Status: DC
  Administered 2019-11-17 – 2019-11-20 (×6): 4 [IU] via SUBCUTANEOUS

## 2019-11-17 NOTE — ED Notes (Signed)
Pt  Eating calmly and pleasantly, without discernable cause, threw tray forward and spilled food/milk on bed and floor. Pt assisted into clean gown and bedding. Ativan provided PO in applesauce to address agitation

## 2019-11-17 NOTE — Progress Notes (Signed)
Chart reviewed. Patient with history of dementia with aggressive behaviors, currently awaiting inpatient geri-psych bed. She remains aggressive in the ED and required restraints this morning. Per nursing report she has been spitting out PO medications. I have ordered Zyprexa 2.5 mg IM PRN refusal of Risperdal. EKG ordered as well- QTC 497 on 11/12/19.

## 2019-11-17 NOTE — ED Notes (Signed)
Tele psych machine at bedside 

## 2019-11-17 NOTE — BH Assessment (Signed)
[facility] Assessment Progress Note   Patient was seen for reassessment.  Patient continues to be confused and disoriented.  She was mildly agitated that I was asking her questions and she wanted to know why it was necessary that I am getting into her personal business.  Out of the blue, she states, "I don't use any dope and I don't intent to.  I have never used dope or alcohol."  Patient continues to be recommended for geriatric psych inpatient.

## 2019-11-17 NOTE — ED Notes (Signed)
Soft restraints removed from pt right wrist and right ankle, will monitor to see how patient tolerates. Encouragement and support provided. Sitter at bedside

## 2019-11-17 NOTE — Progress Notes (Signed)
CSW called [facility] and spoke with Caryl Pina. CSW reported patient's blood sugars are still inconsistent.   CSW called and spoke with RN. Her orders have been changed to a carb modified diet and to not hold her insulin.   Patient was on sliding scale. Now is on novolog and NPH x2 daily.   CSW will follow up with Stanton Kidney or Caryl Pina tomorrow at [facility] to provide update on blood sugar.   Domenic Schwab, MSW, LCSW-A Clinical Disposition Social Worker [facility]/TTS [phone]

## 2019-11-17 NOTE — ED Notes (Addendum)
Patient woke up attempting to claim out of bed; Patient was kicking and punching at this RN and sitter while trying to assist her back to bed to keep her from falling; Pt is agitated and needed several staff to change brief and ensure she was not wet; EDP notified for replacement of non violent restraints,RN

## 2019-11-17 NOTE — ED Notes (Addendum)
Restraints discontinued at 0751. Sitter at bedside assisting pt with breakfast tray. Encouragement and support provided. Will continue to monitor.

## 2019-11-17 NOTE — ED Notes (Signed)
[facility]-Psych   Breakfast ordered

## 2019-11-17 NOTE — Progress Notes (Signed)
Inpatient Diabetes Program Recommendations  AACE/ADA: New Consensus Statement on Inpatient Glycemic Control (2015)  Target Ranges:  Prepandial:   less than 140 mg/dL      Peak postprandial:   less than 180 mg/dL (1-2 hours)      Critically ill patients:  140 - 180 mg/dL   Lab Results  Component Value Date   GLUCAP 390 (H) 11/17/2019   HGBA1C 7.0 (H) 09/25/2012    Review of Glycemic Control  Diabetes history: DM 2 Outpatient Diabetes medications: Humalog 50/50 24 units qam, 4 units lunch, 20 units qpm, Metformin 500 mg Daily Current orders for Inpatient glycemic control:  NPH 17 units qam, 14 units qpm Novolog 4 units tid meal coverage Novolog 0-15 units tid + hs  Inpatient Diabetes Program Recommendations:    Consider increasing:  - NPH 26 units qam - NPH 24 units qpm - Novolog 6 units tid meal coverage with parameters (give if pt eating at least 50% of meals and glucose is at least 80 mg/dl)  -ADD Novolog 0-9 units tid + hs scale  Thanks,  Tama Headings RN, MSN, BC-ADM Inpatient Diabetes Coordinator Team Pager [phone] (8a-5p)
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
## 2019-11-17 NOTE — ED Notes (Signed)
Pt spits out oral medication when given to her, Jenne Campus NP made aware.

## 2019-11-18 LAB — CBG MONITORING, ED
Glucose-Capillary: 235 mg/dL — ABNORMAL HIGH (ref 70–99)
Glucose-Capillary: 160 mg/dL — ABNORMAL HIGH (ref 70–99)
Glucose-Capillary: 136 mg/dL — ABNORMAL HIGH (ref 70–99)
Glucose-Capillary: 173 mg/dL — ABNORMAL HIGH (ref 70–99)

## 2019-11-18 NOTE — Progress Notes (Signed)
CSW called and spoke with Erline Levine at [facility]. She asked CSW to faxed blood sugar results. The medical provider would have to review in the morning.   CSW faxed updated blood sugar results. CSW will call in the morning to confirm they were received.   CSW will continue to follow and assist with disposition planning.   Domenic Schwab, MSW, LCSW-A Clinical Disposition Social Worker [facility]/TTS [phone]

## 2019-11-18 NOTE — Progress Notes (Signed)
Inpatient Diabetes Program Recommendations  AACE/ADA: New Consensus Statement on Inpatient Glycemic Control (2015)  Target Ranges:  Prepandial:   less than 140 mg/dL      Peak postprandial:   less than 180 mg/dL (1-2 hours)      Critically ill patients:  140 - 180 mg/dL   Lab Results  Component Value Date   GLUCAP 235 (H) 11/18/2019   HGBA1C 7.0 (H) 09/25/2012    Review of Glycemic Control Results for Kristen Bowman, Kristen Bowman (MRN [id number]) as of 11/18/2019 11:55  Ref. Range 11/17/2019 11:12 11/17/2019 16:48 11/17/2019 21:22 11/18/2019 08:13  Glucose-Capillary Latest Ref Range: 70 - 99 mg/dL 390 (H) 290 (H) 164 (H) 235 (H)   Diabetes history: DM 2 Outpatient Diabetes medications: Humalog 50/50 24 units qam, 4 units lunch, 20 units qpm, Metformin 500 mg Daily Current orders for Inpatient glycemic control:  NPH 17 units qam, 14 units qpm Novolog 4 units tid meal coverage Novolog 0-15 units tid + hs  Inpatient Diabetes Program Recommendations:    Consider increasing:  - NPH 20 units qam - NPH 18 units qpm - Novolog 6 units tid meal coverage with parameters (give if pt eating at least 50% of meals and glucose is at least 80 mg/dl)  -ADD Novolog 0-9 units tid + hs scale  Thanks, Bronson Curb, MSN, RNC-OB Diabetes Coordinator [phone] (8a-5p)

## 2019-11-18 NOTE — Progress Notes (Signed)
CSW called [facility] and provided updated on the patient's blood sugars. They have to be 250 or below for at least 24 hours.  Patient's first recorded blood sugar recorded under 250 was at 21:22 last night.  They have to stay below 250 for at least 6-7 more hours before they can review.   CSW will continue to follow.   Domenic Schwab, MSW, LCSW-A Clinical Disposition Social Worker [facility]/TTS [phone]

## 2019-11-18 NOTE — ED Notes (Addendum)
Pt is currently awake and is alert eating meal; pt content and not having any complaints at this moment. CSW to follow up with [facility] sometime today to provide update regarding pts status. This RN will continue to reassess pt and follow up with CSW regarding placement. Pt currently not in restraints (order will be discontinued and need for restraints will be reassessed throughout the day.

## 2019-11-18 NOTE — ED Notes (Signed)
Pt currently attempting to peek under the hospital bed stating "where you going kitty cat?". Pt appears in no sign of distress and with little redirection lies back into the bed. This RN will continue to monitor pts status.

## 2019-11-18 NOTE — ED Notes (Signed)
This RN notified CSW of this pts most recent CBG being within parameters. Per CSW this RN will be notified when bed at [facility] is approved.

## 2019-11-18 NOTE — ED Notes (Signed)
[facility]-Psych   Breakfast ordered

## 2019-11-18 NOTE — ED Notes (Signed)
With assistance from [place], [place] and [place], Actuary, this pts sheets were changed. Also, the Staffing Office was contacted regarding a Electronics engineer for this pt; none are available at the moment. This RN will continue to monitor and observe this pt.

## 2019-11-18 NOTE — BH Assessment (Signed)
REASSESSED 3/825/21:    She is not oriented to person, time, place, and/or situation. She knows her first name but not her last. She sts she is at new place in [place] and doesn't realize she is in the hospital. States that the year is "58 or 21". She is not aware of the situation or reason for her admission. She is pleasantly confused during the assessment. She speaks of someone by the name of "Benjamine Mola" repeatedly. She has a flight of ideas and was difficult to follow or understand he references. She denies SI, HI, and AVH's. She was calm and cooperative during the assessment. Patient continues to be recommended for geriatric psych inpatient treatment.

## 2019-11-18 NOTE — BH Assessment (Signed)
Reassessed 11/18/19:   Patient

## 2019-11-19 LAB — CBG MONITORING, ED
Glucose-Capillary: 196 mg/dL — ABNORMAL HIGH (ref 70–99)
Glucose-Capillary: 80 mg/dL (ref 70–99)
Glucose-Capillary: 188 mg/dL — ABNORMAL HIGH (ref 70–99)
Glucose-Capillary: 144 mg/dL — ABNORMAL HIGH (ref 70–99)

## 2019-11-19 LAB — RESPIRATORY PANEL BY RT PCR (FLU A&B, COVID)
Influenza A by PCR: NEGATIVE
Influenza B by PCR: NEGATIVE
SARS Coronavirus 2 by RT PCR: NEGATIVE

## 2019-11-19 MED ORDER — ZIPRASIDONE MESYLATE 20 MG IM SOLR: 10.0000 mg | Freq: Once | INTRAMUSCULAR | Status: DC

## 2019-11-19 NOTE — ED Notes (Signed)
Patient is sleeping could not get CBG.

## 2019-11-19 NOTE — ED Provider Notes (Signed)
No acute complaints overnight.  Patient continues to exhibit bizarre behavior at times, but no violence or aggression.  Vitals reviewed.  She remains under IVC pending Geri-psych placement.   Wyvonnia Dusky, MD 11/19/19 [phone]

## 2019-11-19 NOTE — ED Notes (Signed)
Pt seems to have woken up; states her husband is here to see her; pt was reoriented somewhat easily to the situation and was instructed by this RN to rest. Pt agreed and is now attempting to go back to sleep.

## 2019-11-19 NOTE — Progress Notes (Addendum)
CSW spoke with Caryl Pina in admissions at [facility]. She received the faxes documenting pt's blood sugar levels last night. The provider will review them this morning and she will call CSW back.   Audree Camel, LCSW, LCAS Disposition CSW [facility]/TTS [phone]  UPDATE: [facility] accepted pt. Bed assignment will be given after new Covid results and updated IVC are received.   [facility] fax: [phone]

## 2019-11-19 NOTE — ED Notes (Signed)
Spoke with Marlou Porch at [facility] regarding Covid test and renewed IVC paperwork. EDP has old and new paperwork to complete at this time.

## 2019-11-19 NOTE — ED Notes (Signed)
New IVC paperwork completed by Dr Langston Masker and given to Network engineer.

## 2019-11-19 NOTE — ED Notes (Signed)
The pt is seeing objects and reaching for things in the air  That are not there

## 2019-11-19 NOTE — BH Assessment (Addendum)
[place] Assessment Progress Note  This Probation officer spoke with patient this date to assess current mental health status. Patient continues to present as disorganized and is not oriented to situation. Patient speaks in a low soft voice that is difficult to understand and makes unrelated statements not associated with this writer's questions. She denies SI, HI, and AVH although this writer is uncertain if patient is processing the content of this writer's questions. She was calm and cooperative during the assessment. Patient continues to be recommended for geriatric psych inpatient treatment. Case was staffed with [place] who recommended a continued inpatient admission.

## 2019-11-19 NOTE — Progress Notes (Signed)
Due to technical difficulties, IVC paperwork was only able to be obtained by [facility] staff at 23:00. [facility] Counselor was informed and this CSW faxed out a copy of IVC to [facility]. [facility] Counselor will follow up in the morning for discharge.

## 2019-11-19 NOTE — ED Notes (Signed)
The pt would not swallow her pills  She held  The depakote in her mouth until she swallowed it 15 minutes later the resperidol she spit out then picked it up and crushed it between her fingers

## 2019-11-19 NOTE — ED Notes (Signed)
IVC, Geri-Psych  Breakfast ordered

## 2019-11-19 NOTE — ED Notes (Signed)
The pt is getting cleaned up for bedtime  Calm

## 2019-11-19 NOTE — ED Notes (Signed)
The pt reports that she is ok pleasant smiling

## 2019-11-19 NOTE — ED Notes (Signed)
IVC Paperwork has been signed/notarized/done/waiting on confirmation paged from [employer].  [employer] Report coming through same time IVC paperwork was fax @ 1710. Fax keep failing at other fax machine.  Retryed to Fax paperwork @ [facility] [place] [employer] @ [phone].

## 2019-11-19 NOTE — ED Notes (Signed)
NT and sitter are cleaning pt and putting on clean brief at this time.

## 2019-11-19 NOTE — BH Assessment (Signed)
Patient is under consideration and expected to have a bed at [facility] pending negative COVID test results and updated IVC completed. The COVID results were faxed to [facility]. The LCSW at [facility] agreed to fax the updated IVC. Counselor will follow up with [facility] to inquire about the next steps for patient's bed placement.
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
## 2019-11-20 LAB — CBG MONITORING, ED
Glucose-Capillary: 178 mg/dL — ABNORMAL HIGH (ref 70–99)
Glucose-Capillary: 193 mg/dL — ABNORMAL HIGH (ref 70–99)

## 2019-11-20 NOTE — ED Notes (Signed)
IVC paperwork renewed 11/19/19 - Copy faxed to [facility] - Copy sent to Medical Records - ALL sets on clipboard.

## 2019-11-20 NOTE — ED Notes (Signed)
[facility] has arrived to transport pt - ALL belongings - 1 labeled belongings bag - NO Valuables noted - given to [facility]. Deputy is en route.

## 2019-11-20 NOTE — Progress Notes (Signed)
Pt accepted to [facility]  Dr. Alonna Minium is the accepting provider.  Call report to [phone] Jacqlyn Larsen, RN @ [facility] ED notified.   Pt is IVC  Pt may be transported by [facility] Dept  Pt scheduled to arrive as soon as transportation has been set up.    Darletta Moll MSW, [place] Worker Disposition  [facility] Ph: [phone] Fax: [phone]  11/20/2019 9:24 AM

## 2019-11-20 NOTE — ED Notes (Signed)
Pt noted to be sleeping - unable to tolerate po meds. Left message for son to notify of tx plan - accepted to [facility].

## 2019-11-20 NOTE — ED Notes (Signed)
[employer] arrived to transport pt - advised this RN had requested [facility] to have [facility] contact so may arrange for transport w/Deputy. [facility] then arrived - advised of plan - requested for this RN to contacted [facility] when hear back from deputy. Deputy called and advised will plan to arrive in 40 minutes. [employer] to [employer].

## 2019-11-20 NOTE — ED Notes (Signed)
Pt's son, [name], aware pt accepted to [facility]. Pt noted to be sleeping at this time.

## 2019-11-20 NOTE — ED Notes (Addendum)
Attempted to call report to [facility]. RN requested for report to be called when transport arrives. Left message for deputy - advised will need w/c van.

## 2019-11-20 NOTE — ED Notes (Signed)
IVC, Geri-Psych  Breakfast ordered

## 2019-11-20 NOTE — ED Notes (Signed)
Patient ate four spoons full of applesauce.

## 2019-11-20 NOTE — ED Notes (Signed)
Left message for deputy advising will need [facility] to assist w/transport. [facility] also aware of need for [employer].

## 2019-11-22 MED ORDER — INSULIN GLARGINE 100 UNIT/ML ~~LOC~~ SOLN
1.00 | SUBCUTANEOUS | Status: DC
Start: 2019-11-22 — End: 2019-11-22

## 2019-11-22 MED ORDER — MORPHINE SULFATE 4 MG/ML IJ SOLN
4.00 | INTRAMUSCULAR | Status: DC
Start: ? — End: 2019-11-22

## 2019-11-22 MED ORDER — DIVALPROEX SODIUM 250 MG PO DR TAB
250.00 | DELAYED_RELEASE_TABLET | ORAL | Status: DC
Start: 2019-11-22 — End: 2019-11-22

## 2019-11-22 MED ORDER — ENOXAPARIN SODIUM 30 MG/0.3ML ~~LOC~~ SOLN
30.00 | SUBCUTANEOUS | Status: DC
Start: 2019-11-22 — End: 2019-11-22

## 2019-11-22 MED ORDER — GENERIC EXTERNAL MEDICATION
Status: DC
Start: ? — End: 2019-11-22

## 2019-11-22 MED ORDER — LOSARTAN POTASSIUM 100 MG PO TABS
100.00 | ORAL_TABLET | ORAL | Status: DC
Start: 2019-11-22 — End: 2019-11-22

## 2019-11-22 MED ORDER — POLYETHYLENE GLYCOL 3350 17 GM/SCOOP PO POWD
17.00 | ORAL | Status: DC
Start: ? — End: 2019-11-22

## 2019-11-22 MED ORDER — RISPERIDONE 0.5 MG PO TABS
0.25 | ORAL_TABLET | ORAL | Status: DC
Start: ? — End: 2019-11-22

## 2019-11-22 MED ORDER — ENOXAPARIN SODIUM 80 MG/0.8ML ~~LOC~~ SOLN
1.00 | SUBCUTANEOUS | Status: DC
Start: 2019-11-23 — End: 2019-11-22

## 2019-11-22 MED ORDER — HYDROCODONE-ACETAMINOPHEN 5-325 MG PO TABS
1.00 | ORAL_TABLET | ORAL | Status: DC
Start: ? — End: 2019-11-22

## 2019-11-22 MED ORDER — INSULIN NPH (HUMAN) (ISOPHANE) 100 UNIT/ML ~~LOC~~ SUSP
14.00 | SUBCUTANEOUS | Status: DC
Start: 2019-11-22 — End: 2019-11-22

## 2019-11-22 MED ORDER — INSULIN LISPRO 100 UNIT/ML ~~LOC~~ SOLN
4.00 | SUBCUTANEOUS | Status: DC
Start: 2019-11-22 — End: 2019-11-22

## 2019-11-22 MED ORDER — RISPERIDONE 0.5 MG PO TABS
0.50 | ORAL_TABLET | ORAL | Status: DC
Start: 2019-11-22 — End: 2019-11-22

## 2019-11-22 MED ORDER — INSULIN GLARGINE 100 UNIT/ML ~~LOC~~ SOLN
1.00 | SUBCUTANEOUS | Status: DC
Start: ? — End: 2019-11-22

## 2019-11-22 MED ORDER — PRAVASTATIN SODIUM 40 MG PO TABS
40.00 | ORAL_TABLET | ORAL | Status: DC
Start: 2019-11-22 — End: 2019-11-22

## 2019-11-22 MED ORDER — ALUM & MAG HYDROXIDE-SIMETH 200-200-20 MG/5ML PO SUSP
15.00 | ORAL | Status: DC
Start: ? — End: 2019-11-22

## 2019-11-22 MED ORDER — CLONAZEPAM 0.5 MG PO TABS
0.50 | ORAL_TABLET | ORAL | Status: DC
Start: 2019-11-22 — End: 2019-11-22

## 2019-11-22 MED ORDER — SODIUM CHLORIDE 0.9 % IV SOLN
10.00 | INTRAVENOUS | Status: DC
Start: ? — End: 2019-11-22

## 2019-11-22 MED ORDER — METOPROLOL SUCCINATE ER 50 MG PO TB24
200.00 | ORAL_TABLET | ORAL | Status: DC
Start: 2019-11-22 — End: 2019-11-22

## 2019-11-22 MED ORDER — SODIUM CHLORIDE 0.9 % IV SOLN
75.00 | INTRAVENOUS | Status: DC
Start: ? — End: 2019-11-22

## 2019-11-22 MED ORDER — LORAZEPAM 0.5 MG PO TABS
0.50 | ORAL_TABLET | ORAL | Status: DC
Start: ? — End: 2019-11-22

## 2019-11-22 MED ORDER — INSULIN LISPRO 100 UNIT/ML ~~LOC~~ SOLN
1.00 | SUBCUTANEOUS | Status: DC
Start: 2019-11-22 — End: 2019-11-22

## 2019-11-22 MED ORDER — INSULIN LISPRO 100 UNIT/ML ~~LOC~~ SOLN
1.00 | SUBCUTANEOUS | Status: DC
Start: ? — End: 2019-11-22

## 2019-11-22 MED ORDER — MUPIROCIN 2 % EX OINT
TOPICAL_OINTMENT | CUTANEOUS | Status: DC
Start: 2019-11-22 — End: 2019-11-22

## 2019-11-22 MED ORDER — ASPIRIN 325 MG PO TBEC
325.00 | DELAYED_RELEASE_TABLET | ORAL | Status: DC
Start: 2019-11-28 — End: 2019-11-22

## 2019-11-22 MED ORDER — ACETAMINOPHEN 325 MG PO TABS
650.00 | ORAL_TABLET | ORAL | Status: DC
Start: ? — End: 2019-11-22

## 2019-11-22 MED ORDER — INSULIN NPH (HUMAN) (ISOPHANE) 100 UNIT/ML ~~LOC~~ SUSP
17.00 | SUBCUTANEOUS | Status: DC
Start: ? — End: 2019-11-22

## 2019-11-22 MED ORDER — LABETALOL HCL 5 MG/ML IV SOLN
20.00 | INTRAVENOUS | Status: DC
Start: ? — End: 2019-11-22

## 2019-11-27 MED ORDER — GENERIC EXTERNAL MEDICATION
Status: DC
Start: ? — End: 2019-11-27

## 2019-11-27 MED ORDER — METOPROLOL TARTRATE 50 MG PO TABS
50.00 | ORAL_TABLET | ORAL | Status: DC
Start: 2019-11-27 — End: 2019-11-27

## 2019-11-27 MED ORDER — INSULIN GLARGINE 100 UNIT/ML ~~LOC~~ SOLN
1.00 | SUBCUTANEOUS | Status: DC
Start: ? — End: 2019-11-27

## 2019-11-27 MED ORDER — INSULIN LISPRO 100 UNIT/ML ~~LOC~~ SOLN
1.00 | SUBCUTANEOUS | Status: DC
Start: ? — End: 2019-11-27

## 2019-11-27 MED ORDER — DIVALPROEX SODIUM 250 MG PO DR TAB
250.00 | DELAYED_RELEASE_TABLET | ORAL | Status: DC
Start: 2019-11-27 — End: 2019-11-27

## 2019-11-27 MED ORDER — RISPERIDONE 0.5 MG PO TABS
0.50 | ORAL_TABLET | ORAL | Status: DC
Start: 2019-11-28 — End: 2019-11-27

## 2019-11-27 MED ORDER — LOSARTAN POTASSIUM 100 MG PO TABS
100.00 | ORAL_TABLET | ORAL | Status: DC
Start: 2019-11-28 — End: 2019-11-27

## 2019-11-27 MED ORDER — HALOPERIDOL LACTATE 5 MG/ML IJ SOLN
2.00 | INTRAMUSCULAR | Status: DC
Start: ? — End: 2019-11-27

## 2019-11-27 MED ORDER — ATORVASTATIN CALCIUM 40 MG PO TABS
40.00 | ORAL_TABLET | ORAL | Status: DC
Start: 2019-11-27 — End: 2019-11-27

## 2019-11-27 MED ORDER — INSULIN GLARGINE 100 UNIT/ML ~~LOC~~ SOLN
1.00 | SUBCUTANEOUS | Status: DC
Start: 2019-11-27 — End: 2019-11-27

## 2019-11-27 MED ORDER — HALOPERIDOL LACTATE 5 MG/ML IJ SOLN
5.00 | INTRAMUSCULAR | Status: DC
Start: ? — End: 2019-11-27

## 2019-11-27 MED ORDER — HYDRALAZINE HCL 20 MG/ML IJ SOLN
10.00 | INTRAMUSCULAR | Status: DC
Start: ? — End: 2019-11-27

## 2019-11-27 MED ORDER — INSULIN LISPRO 100 UNIT/ML ~~LOC~~ SOLN
1.00 | SUBCUTANEOUS | Status: DC
Start: 2019-11-27 — End: 2019-11-27

## 2019-11-27 MED ORDER — NITROGLYCERIN 2 % TD OINT
1.00 | TOPICAL_OINTMENT | TRANSDERMAL | Status: DC
Start: 2019-11-28 — End: 2019-11-27

## 2019-11-27 MED ORDER — BIOTENE ORALBALANCE DRY MOUTH MT GEL
OROMUCOSAL | Status: DC
Start: ? — End: 2019-11-27

## 2019-11-27 MED ORDER — HEPARIN SODIUM (PORCINE) 5000 UNIT/ML IJ SOLN
5000.00 | INTRAMUSCULAR | Status: DC
Start: 2019-11-27 — End: 2019-11-27

## 2019-11-27 MED ORDER — DONEPEZIL HCL 5 MG PO TABS
5.00 | ORAL_TABLET | ORAL | Status: DC
Start: 2019-11-27 — End: 2019-11-27

## 2019-11-27 MED ORDER — RISPERIDONE 1 MG PO TABS
1.00 | ORAL_TABLET | ORAL | Status: DC
Start: 2019-11-27 — End: 2019-11-27

## 2019-11-30 ENCOUNTER — Ambulatory Visit: Payer: Medicare Other | Admitting: Neurology

## 2020-01-25 ENCOUNTER — Encounter (HOSPITAL_COMMUNITY): Payer: Self-pay

## 2020-01-25 ENCOUNTER — Other Ambulatory Visit: Payer: Self-pay

## 2020-01-25 ENCOUNTER — Emergency Department (HOSPITAL_COMMUNITY)
Admission: EM | Admit: 2020-01-25 | Discharge: 2020-01-26 | Disposition: A | Discharge status: Home / Self Care | Payer: Medicare Other | Attending: Emergency Medicine | Admitting: Emergency Medicine

## 2020-01-25 DIAGNOSIS — Z794 Long term (current) use of insulin: Secondary | ICD-10-CM | POA: Insufficient documentation

## 2020-01-25 DIAGNOSIS — I10 Essential (primary) hypertension: Secondary | ICD-10-CM | POA: Insufficient documentation

## 2020-01-25 DIAGNOSIS — E119 Type 2 diabetes mellitus without complications: Secondary | ICD-10-CM | POA: Diagnosis not present

## 2020-01-25 NOTE — ED Triage Notes (Signed)
Patient arrived by EMS from [facility] [address] for HTN. Facility reports that patient had BP of 240. Patient started on medication the end of April for HTN. Patient states she feels fine and nothing bothering her. BP normal in ED

## 2020-01-26 LAB — BASIC METABOLIC PANEL
Anion gap: 12 (ref 5–15)
BUN: 15 mg/dL (ref 8–23)
CO2: 24 mmol/L (ref 22–32)
Calcium: 9 mg/dL (ref 8.9–10.3)
Chloride: 106 mmol/L (ref 98–111)
Creatinine, Ser: 1.06 mg/dL — ABNORMAL HIGH (ref 0.44–1.00)
GFR calc Af Amer: 55 mL/min — ABNORMAL LOW (ref >60)
GFR calc non Af Amer: 48 mL/min — ABNORMAL LOW (ref >60)
Glucose, Bld: 240 mg/dL — ABNORMAL HIGH (ref 70–99)
Potassium: 3.5 mmol/L (ref 3.5–5.1)
Sodium: 142 mmol/L (ref 135–145)

## 2020-01-26 LAB — CBC WITH DIFFERENTIAL/PLATELET
Abs Immature Granulocytes: 0.01 10*3/uL (ref 0.00–0.07)
Basophils Absolute: 0 10*3/uL (ref 0.0–0.1)
Basophils Relative: 1 %
Eosinophils Absolute: 0.3 10*3/uL (ref 0.0–0.5)
Eosinophils Relative: 5 %
HCT: 36.6 % (ref 36.0–46.0)
Hemoglobin: 11.9 g/dL — ABNORMAL LOW (ref 12.0–15.0)
Immature Granulocytes: 0 %
Lymphocytes Relative: 31 %
Lymphs Abs: 1.9 10*3/uL (ref 0.7–4.0)
MCH: 30.5 pg (ref 26.0–34.0)
MCHC: 32.5 g/dL (ref 30.0–36.0)
MCV: 93.8 fL (ref 80.0–100.0)
Monocytes Absolute: 0.6 10*3/uL (ref 0.1–1.0)
Monocytes Relative: 9 %
Neutro Abs: 3.2 10*3/uL (ref 1.7–7.7)
Neutrophils Relative %: 54 %
Platelets: 167 10*3/uL (ref 150–400)
RBC: 3.9 MIL/uL (ref 3.87–5.11)
RDW: 15.1 % (ref 11.5–15.5)
WBC: 6 10*3/uL (ref 4.0–10.5)
nRBC: 0 % (ref 0.0–0.2)

## 2020-01-26 LAB — URINALYSIS, ROUTINE W REFLEX MICROSCOPIC
Bilirubin Urine: NEGATIVE
Glucose, UA: 50 mg/dL — AB
Hgb urine dipstick: NEGATIVE
Ketones, ur: 5 mg/dL — AB
Leukocytes,Ua: NEGATIVE
Nitrite: NEGATIVE
Protein, ur: NEGATIVE mg/dL
Specific Gravity, Urine: 1.005 (ref 1.005–1.030)
pH: 7 (ref 5.0–8.0)

## 2020-01-26 LAB — CBG MONITORING, ED: Glucose-Capillary: 236 mg/dL — ABNORMAL HIGH (ref 70–99)

## 2020-01-26 MED ORDER — LOSARTAN POTASSIUM 50 MG PO TABS
100.0000 mg | ORAL_TABLET | ORAL | Status: AC
  Administered 2020-01-26: 100 mg via ORAL
  Filled 2020-01-26: qty 2

## 2020-01-26 MED ORDER — METOPROLOL SUCCINATE ER 100 MG PO TB24
200.0000 mg | ORAL_TABLET | Freq: Every day | ORAL | Status: DC
  Administered 2020-01-26: 200 mg via ORAL
  Filled 2020-01-26: qty 2

## 2020-01-26 MED ORDER — LABETALOL HCL 5 MG/ML IV SOLN
5.0000 mg | Freq: Once | INTRAVENOUS | Status: AC
  Administered 2020-01-26: 5 mg via INTRAVENOUS
  Filled 2020-01-26: qty 4

## 2020-01-26 MED ORDER — INSULIN ASPART 100 UNIT/ML ~~LOC~~ SOLN
5.0000 [IU] | Freq: Once | SUBCUTANEOUS | Status: AC
  Administered 2020-01-26: 5 [IU] via SUBCUTANEOUS

## 2020-01-26 NOTE — ED Notes (Signed)
CBG: 236 (Override due to Downtime).

## 2020-01-26 NOTE — ED Provider Notes (Signed)
[facility] EMERGENCY DEPARTMENT Provider Note   CSN: [id number] Arrival date & time: 01/25/20  1830     History Chief Complaint  Patient presents with  . Hypertension    ALEY HEMMER is a 86 y.o. female.  KAYLINA URSO is a 86 y.o. female with a history of diabetes, type retention, hyperlipidemia, anemia, PMR, IBS, anxiety, who presents to the emergency department for evaluation of hypertension.  Patient sent by EMS from [facility] assisted living facility for evaluation of systolic blood pressure of 240.  Patient reports they were checking her blood pressure routinely before dinner, and found it to be elevated.  She reports she is felt completely normal today.  Denies any chest pain, shortness of breath or lower extremity swelling.  No lightheadedness or syncope.  No headaches, vision changes, dizziness, nausea, vomiting, numbness, weakness or tingling.  States that she was recently started on blood pressure medication at the end of April, per documentation she takes 100 mg of losartan, also takes 200 mg of Toprol-XL.  She denies any other complaints today.  She is accompanied by her daughter.        Past Medical History:  Diagnosis Date  . Anemia   . Anxiety states   . Diabetes mellitus    T2DM with renal manifestations  . Generalized osteoarthrosis, unspecified site   . Hematuria   . Hyperlipidemia    mixed  . Hypertension   . Irritable bowel syndrome   . Other specified cardiac dysrhythmias(427.89)    atrial fibrilation  . Panic disorder   . Polymyalgia rheumatica ([place])   . Proteinuria 05/29/10  . Type II or unspecified type diabetes mellitus with renal manifestations, uncontrolled(250.42) 05/29/10   CKD II (mild)    Patient Active Problem List   Diagnosis Date Noted  . Dementia ([place]) 10/17/2019  . Pulmonary hypertension ([place]) 06/09/2014  . Tachycardia 09/24/2012  . Palpitations 09/24/2012  . CKD (chronic kidney disease), stage II 09/24/2012  .  Anemia, iron deficiency 09/24/2012  . Hypertension   . Panic disorder   . Hyperlipidemia   . Type II or unspecified type diabetes mellitus with renal manifestations, uncontrolled(250.42) 05/29/2010    Past Surgical History:  Procedure Laterality Date  . CATARACT EXTRACTION     right  . COLONOSCOPY N/A 11/04/2012   Procedure: COLONOSCOPY;  Surgeon: Arta Silence, MD;  Location: [facility] ENDOSCOPY;  Service: Endoscopy;  Laterality: N/A;  . ESOPHAGOGASTRODUODENOSCOPY N/A 11/04/2012   Procedure: ESOPHAGOGASTRODUODENOSCOPY (EGD);  Surgeon: Arta Silence, MD;  Location: [facility];  Service: Endoscopy;  Laterality: N/A;  . TUBAL LIGATION       OB History   No obstetric history on file.     Family History  Problem Relation Age of Onset  . Diabetes Sister   . Cancer Other   . Heart attack Other   . Emphysema Mother   . Leukemia Father     Social History   Tobacco Use  . Smoking status: Never Smoker  . Smokeless tobacco: Never Used  Substance Use Topics  . Alcohol use: No  . Drug use: No    Home Medications Prior to Admission medications   Medication Sig Start Date End Date Taking? Authorizing Provider  clonazePAM (KLONOPIN) 1 MG tablet Take 1 mg by mouth 2 (two) times daily as needed. 10/29/19   [provider]  divalproex (DEPAKOTE) 250 MG DR tablet Take 1 tablet every night 10/28/19   Cameron Sprang, MD  HUMALOG MIX 50/50 Claiborne Rigg (  50-50) 100 UNIT/ML Kwikpen Inject 4-24 Units into the skin. Inject 24 units into the skin before breakfast, 4 units before lunch, and 20 units before evening meal and only if BGL is at least 100 ("otherwise, inject during or after the meal") 09/24/19   [provider]  ibuprofen (ADVIL) 200 MG tablet Take 200 mg by mouth every 6 (six) hours as needed for mild pain.    [provider]  losartan (COZAAR) 100 MG tablet Take 100 mg by mouth daily.  08/08/16   [provider]  metFORMIN (GLUCOPHAGE-XR) 500 MG 24 hr tablet  Take 500 mg by mouth daily.  08/14/16   [provider]  metoprolol succinate (TOPROL-XL) 100 MG 24 hr tablet Take 200 mg by mouth daily. Take with or immediately following a meal.    [provider]  simvastatin (ZOCOR) 20 MG tablet Take 20 mg by mouth every evening.    [provider]    Allergies    Ace inhibitors, Lisinopril, and Tape  Review of Systems   Review of Systems  Constitutional: Negative for chills and fever.  HENT: Negative.   Eyes: Negative for visual disturbance.  Respiratory: Negative for cough and shortness of breath.   Cardiovascular: Negative for chest pain and leg swelling.  Gastrointestinal: Negative for abdominal pain, nausea and vomiting.  Genitourinary: Negative for dysuria and frequency.  Musculoskeletal: Negative for arthralgias and myalgias.  Skin: Negative for color change and rash.  Neurological: Negative for dizziness, syncope, facial asymmetry, speech difficulty, weakness, light-headedness, numbness and headaches.    Physical Exam Updated Vital Signs BP (!) 212/95   Pulse 83   Temp 98.1 F (36.7 C) (Oral)   Resp 16   Ht 5\' 5"  (1.651 m)   Wt 81 kg   SpO2 95%   BMI 29.72 kg/m   Physical Exam Vitals and nursing note reviewed.  Constitutional:      General: She is not in acute distress.    Appearance: Normal appearance. She is well-developed and normal weight. She is not ill-appearing or diaphoretic.  HENT:     Head: Normocephalic and atraumatic.     Mouth/Throat:     Mouth: Mucous membranes are moist.     Pharynx: Oropharynx is clear.  Eyes:     General:        Right eye: No discharge.        Left eye: No discharge.     Extraocular Movements: Extraocular movements intact.     Conjunctiva/sclera: Conjunctivae normal.     Pupils: Pupils are equal, round, and reactive to light.  Cardiovascular:     Rate and Rhythm: Normal rate and regular rhythm.     Heart sounds: Normal heart sounds. No murmur. No friction  rub. No gallop.   Pulmonary:     Effort: Pulmonary effort is normal. No respiratory distress.     Breath sounds: Normal breath sounds. No wheezing or rales.     Comments: Respirations equal and unlabored, patient able to speak in full sentences, lungs clear to auscultation bilaterally Abdominal:     General: Bowel sounds are normal. There is no distension.     Palpations: Abdomen is soft. There is no mass.     Tenderness: There is no abdominal tenderness. There is no guarding.     Comments: Abdomen soft, nondistended, nontender to palpation in all quadrants without guarding or peritoneal signs  Musculoskeletal:        General: No deformity.  Cervical back: Neck supple.     Right lower leg: No edema.     Left lower leg: No edema.     Comments: Bilateral lower extremities warm and well perfused without edema  Skin:    General: Skin is warm and dry.     Capillary Refill: Capillary refill takes less than 2 seconds.  Neurological:     Mental Status: She is alert and oriented to person, place, and time.     Coordination: Coordination normal.     Comments: Speech is clear, able to follow commands CN III-XII intact Normal strength in upper and lower extremities bilaterally including dorsiflexion and plantar flexion, strong and equal grip strength Sensation normal to light and sharp touch Moves extremities without ataxia, coordination intact No pronator drift.  Psychiatric:        Mood and Affect: Mood normal.        Behavior: Behavior normal.     ED Results / Procedures / Treatments   Labs (all labs ordered are listed, but only abnormal results are displayed) Labs Reviewed  BASIC METABOLIC PANEL - Abnormal; Notable for the following components:      Result Value   Glucose, Bld 240 (*)    Creatinine, Ser 1.06 (*)    GFR calc non Af Amer 48 (*)    GFR calc Af Amer 55 (*)    All other components within normal limits  CBC WITH DIFFERENTIAL/PLATELET - Abnormal; Notable for the  following components:   Hemoglobin 11.9 (*)    All other components within normal limits  URINALYSIS, ROUTINE W REFLEX MICROSCOPIC - Abnormal; Notable for the following components:   Color, Urine COLORLESS (*)    Glucose, UA 50 (*)    Ketones, ur 5 (*)    All other components within normal limits  CBG MONITORING, ED  CBG MONITORING, ED    EKG EKG Interpretation  Date/Time:  Wednesday January 26 2020 01:58:02 EDT Ventricular Rate:  87 PR Interval:    QRS Duration: 142 QT Interval:  423 QTC Calculation: 509 R Axis:   105 Text Interpretation: Sinus rhythm Ventricular trigeminy Probable left atrial enlargement RBBB and LPFB Rate has improved Confirmed by Pryor Curia [phone]) on 01/26/2020 2:13:50 AM   Radiology No results found.  Procedures Procedures (including critical care time)  Medications Ordered in ED Medications  metoprolol succinate (TOPROL-XL) 24 hr tablet 200 mg (200 mg Oral Given 01/26/20 0441)  losartan (COZAAR) tablet 100 mg (100 mg Oral Given 01/26/20 0256)  insulin aspart (novoLOG) injection 5 Units (5 Units Subcutaneous Given 01/26/20 0416)  labetalol (NORMODYNE) injection 5 mg (5 mg Intravenous Given 01/26/20 0419)    ED Course  I have reviewed the triage vital signs and the nursing notes.  Pertinent labs & imaging results that were available during my care of the patient were reviewed by me and considered in my medical decision making (see chart for details).    MDM Rules/Calculators/A&P                     86 year old female sent from nursing facility for hypertension, systolic blood pressure of 240 noted at facility during routine check. Patient denies any associated chest pain, shortness of breath, headache or neurologic symptoms. Recently started on blood pressure medication at the end of April, took her losartan and metoprolol this morning. She has reassuring exam, no neurologic deficits. Hypertensive with systolic in the [id number] here in the ED, asymptomatic with  low suspicion for hypertensive  emergency. Will check basic labs and EKG and give patient's home BP medications.  I have independently ordered, reviewed and interpreted all labs: CBC: No leukocytosis, stable hemoglobin BMP: Glucose of 240, no other significant electrolyte derangements, creatinine 1.06 UA: No proteinuria or hematuria  Patient's blood pressure slowly improving. She was given 5 units of subcu insulin for hyperglycemia. No signs of hypertensive emergency here in the ED and patient has remained asymptomatic throughout ED stay. We will have her continue her home blood pressure medications and discuss with her doctor for further blood pressure management. Return precautions discussed. Patient's daughter requests that she be transported back to nursing facility via EMS, PTAR called for transport. Discharged in good condition  Patient discussed with Dr. Leonides Schanz, who saw patient as well and agrees with plan.  Final Clinical Impression(s) / ED Diagnoses Final diagnoses:  Hypertension, unspecified type    Rx / DC Orders ED Discharge Orders    None       Janet Berlin 01/26/20 [id number]    Ward, Delice Bison, DO 01/26/20 [phone]

## 2020-01-26 NOTE — ED Notes (Signed)
Patient verbalizes understanding of discharge instructions. Opportunity for questioning and answers were provided. Pt is now awaiting for transport back to facility [facility]) until [facility] arrives for transport.

## 2020-01-26 NOTE — ED Notes (Signed)
PTAR at Bedside for report; Report given to same.

## 2020-01-26 NOTE — Discharge Instructions (Signed)
Patient's work-up today is reassuring.  She was given her morning doses of losartan and metoprolol, as well as a dose of insulin.  She should discuss with her doctor further medications for blood pressure management.

## 2020-01-26 NOTE — ED Notes (Signed)
CALLED [facility] FOR TRANSPORT BACK TO [facility]--Kristen Bowman

## 2020-06-09 ENCOUNTER — Other Ambulatory Visit: Payer: Self-pay

## 2020-06-09 ENCOUNTER — Ambulatory Visit (INDEPENDENT_AMBULATORY_CARE_PROVIDER_SITE_OTHER): Payer: Medicare Other | Admitting: Neurology

## 2020-06-09 ENCOUNTER — Encounter: Payer: Self-pay | Admitting: Neurology

## 2020-06-09 VITALS — BP 183/82 | HR 115 | Ht 65.0 in | Wt 159.0 lb

## 2020-06-09 DIAGNOSIS — G2401 Drug induced subacute dyskinesia: Secondary | ICD-10-CM | POA: Diagnosis not present

## 2020-06-09 DIAGNOSIS — F0391 Unspecified dementia with behavioral disturbance: Secondary | ICD-10-CM | POA: Diagnosis not present

## 2020-06-09 DIAGNOSIS — F03B18 Unspecified dementia, moderate, with other behavioral disturbance: Secondary | ICD-10-CM

## 2020-06-09 NOTE — Patient Instructions (Signed)
Looking great! Continue all your medications. Continue working on BP with your regular doctor. Continue 24/7 care. Follow-up in 6-8 months, call for any changes

## 2020-06-09 NOTE — Progress Notes (Signed)
NEUROLOGY FOLLOW UP OFFICE NOTE  Kristen Bowman [id number] Oct 01, 1930  HISTORY OF PRESENT ILLNESS: I had the pleasure of seeing Kristen Bowman in follow-up in the neurology clinic on 84/15/2021.  The patient was last seen 7 months ago for moderate dementia with behavioral disturbance. She is again accompanied by her her daughter Kristen Bowman who helps supplement the history today.  Records and images were personally reviewed where available.  Since her last visit, she was admitted to [facility] inpatient psychiatry from 3/27 to 3/28. She was having increased episodes of aggression and agitation, she was emaciated and unkempt. She was started on Risperdal and Depakote was continued. During her stay, she collapsed and was unresponsive, hypotensive with SBP in the 70s. She was transferred back to the hospital, MRI brain showed a small acute infarct in the right parietal lobe, small subacute infarcts in bilateral occipital lobes. Stroke workup negative,carotid ultrasound no significant stenosis, echo showed EF 60-65%, cardiac event monitor recommended. She was discharged home on aspirin and statin. She was transferred to [facility]. She is currently on Donepezil 5mg  daily, Depakote DR 250mg  qhs, and Risperdal 0.5mg  qhs. She has clonazepam as needed.   She and her daughter both report that everything is a lot better since March. She does not remember much of her hospitalization. Medications are administered at [facility], her son manages finances. She states her mood is not very good, she is not satisfied with where she is and wants to be at home. She has a mild headache once in a while. She had some neck pain the past 2 days. No dizziness, diplopia, dysarthria/dysphagia, focal numbness/tingling/weakness, bowel/bladder dysfunction. Sleep is pretty good. No falls.    History on Initial Assessment 10/28/2019: This is an 86 year old right-handed woman with a history of hypertension, hyperlipidemia, diabetes, PMR,  presenting for evaluation of memory loss. She lives with her son Kristen Bowman. Her daughter Kristen Bowman lives next door. She feels her memory is not good, she does not remember a lot. Kristen Bowman started noticing changes in 2019 while they were at an [employer] in [place]. Two days after they got home, she started saying that he had not taken her to the beach in a couple of years. He feels there was a big drastic change since then. He has been living with her for the past 12 years, making sure she eats and administering her insulin, but saying she was always pretty self-sufficient. In October 2020, he found her outside in the backyard, very confused, with a flipflop on one foot and a boot in another foot. She was hallucinating, thinking she was in [facility]. Since then, symptoms progressed that he felt she needed IVC last 10/16/19. She was wandering outside, talking to her deceased father. Prior to this, she went missing earlier in the week. She was not sleeping. There is no prior psychiatric history except for panic attacks in the 1990s. She has been taking clonazepam qhs for several years. This past weekend she would repeatedly say she is not home and she has to get out of there. They had to change the locks in the house to keep her inside. On Tuesday night she went outside with her daughter, she was out from 2pm until she got tired at 8pm. She states she still cooks, Kristen Bowman fixes meals. Kristen Bowman reports some paranoia. She stopped driving in September 2020. Her paternal aunt and father had memory issues. No significant head injuries or alcohol use. She denies any headaches, dizziness, diplopia,  dysarthria/dysphagia, neck pain, focal numbness/tingling/weakness, no falls. She has occasional urinary incontinence. She needs assistance with bathing, getting confused with the water and temperature.    I personally reviewed head CT without contrast done 09/2019 which did not show any acute changes. There was moderate atrophy and mild  chronic microvascular disease.   PAST MEDICAL HISTORY: Past Medical History:  Diagnosis Date   Anemia    Anxiety states    Diabetes mellitus    T2DM with renal manifestations   Generalized osteoarthrosis, unspecified site    Hematuria    Hyperlipidemia    mixed   Hypertension    Irritable bowel syndrome    Other specified cardiac dysrhythmias(427.89)    atrial fibrilation   Panic disorder    Polymyalgia rheumatica ([facility])    Proteinuria 05/29/10   Type II or unspecified type diabetes mellitus with renal manifestations, uncontrolled(250.42) 05/29/10   CKD II (mild)    MEDICATIONS: Current Outpatient Medications on File Prior to Visit  Medication Sig Dispense Refill   clonazePAM (KLONOPIN) 1 MG tablet Take 1 mg by mouth 2 (two) times daily as needed.     divalproex (DEPAKOTE) 250 MG DR tablet Take 1 tablet every night 30 tablet 5   HUMALOG MIX 50/50 KWIKPEN (50-50) 100 UNIT/ML Kwikpen Inject 4-24 Units into the skin. Inject 24 units into the skin before breakfast, 4 units before lunch, and 20 units before evening meal and only if BGL is at least 100 ("otherwise, inject during or after the meal")     ibuprofen (ADVIL) 200 MG tablet Take 200 mg by mouth every 6 (six) hours as needed for mild pain.     losartan (COZAAR) 100 MG tablet Take 100 mg by mouth daily.      metFORMIN (GLUCOPHAGE-XR) 500 MG 24 hr tablet Take 500 mg by mouth daily.      metoprolol succinate (TOPROL-XL) 100 MG 24 hr tablet Take 200 mg by mouth daily. Take with or immediately following a meal.     simvastatin (ZOCOR) 20 MG tablet Take 20 mg by mouth every evening.     No current facility-administered medications on file prior to visit.    ALLERGIES: Allergies  Allergen Reactions   Ace Inhibitors Shortness Of Breath   Lisinopril Shortness Of Breath   Tape Other (See Comments)    SKIN BRUISES AND TEARS EASILY!!    FAMILY HISTORY: Family History  Problem Relation Age of Onset    Diabetes Sister    Cancer Other    Heart attack Other    Emphysema Mother    Leukemia Father     SOCIAL HISTORY: Social History   Socioeconomic History   Marital status: Divorced    Spouse name: Not on file   Number of children: Not on file   Years of education: Not on file   Highest education level: Not on file  Occupational History   Not on file  Tobacco Use   Smoking status: Never Smoker   Smokeless tobacco: Never Used  Vaping Use   Vaping Use: Never used  Substance and Sexual Activity   Alcohol use: No   Drug use: No   Sexual activity: Not on file  Other Topics Concern   Not on file  Social History Narrative   Right handed    Lives at [facility]   Social Determinants of Health   Financial Resource Strain:    Difficulty of Paying Living Expenses: Not on file  Food Insecurity:  Worried About Charity fundraiser in the Last Year: Not on file   [employer] of Food in the Last Year: Not on file  Transportation Needs:    Lack of Transportation (Medical): Not on file   Lack of Transportation (Non-Medical): Not on file  Physical Activity:    Days of Exercise per Week: Not on file   Minutes of Exercise per Session: Not on file  Stress:    Feeling of Stress : Not on file  Social Connections:    Frequency of Communication with Friends and Family: Not on file   Frequency of Social Gatherings with Friends and Family: Not on file   Attends Religious Services: Not on file   Active Member of Clubs or Organizations: Not on file   Attends Archivist Meetings: Not on file   Marital Status: Not on file  Intimate Partner Violence:    Fear of Current or Ex-Partner: Not on file   Emotionally Abused: Not on file   Physically Abused: Not on file   Sexually Abused: Not on file     PHYSICAL EXAM: Vitals:   06/09/20 1505  BP: (!) 183/82  Pulse: (!) 115  SpO2: 96%   General: No acute distress Head:   Normocephalic/atraumatic Skin/Extremities: No rash, no edema Neurological Exam: alert and oriented to person, place. No aphasia or dysarthria. Fund of knowledge is reduced.  Recent and remote memory are impaired.  Attention and concentration are normal. MMSE 25/30 MMSE - Mini Mental State Exam 06/09/2020  Orientation to time 3  Orientation to Place 5  Registration 3  Attention/ Calculation 5  Recall 1  Language- name 2 objects 2  Language- repeat 1  Language- follow 3 step command 2  Language- read & follow direction 1  Write a sentence 1  Copy design 1  Total score 25   Cranial nerves: Pupils equal, round. Extraocular movements intact with no nystagmus. Visual fields full.  No facial asymmetry.  Motor: Increased tone, no cogwheeling, muscle strength 5/5 throughout with no pronator drift.   Finger to nose testing intact.  Gait narrow-based and steady with good arm swing and normal stride.  Romberg negative. Patient has a jar tremor. No tremors in extremities.    IMPRESSION: This is an 86 yo RH woman with a history of hypertension, hyperlipidemia, diabetes, PMR, with moderate dementia with behavioral disturbance. SLUMS score 8/30 in 10/2019. She was admitted for increased behavioral changes in March 2021, now living at [facility]. MMSE today 25/30. She has been more stable in this living environment, continue 24/7 care. Continue all medications, she is on Donepezil, Depakote, Risperdal, and prn clonazepam. She is noted to have some jaw movements concerning for tardive dyskinesia, follow-up with Psychiatry. Discuss BP concerns with PCP. Follow-up in 6-8 months, they know to call for any changes.    Thank you for allowing me to participate in her care.  Please do not hesitate to call for any questions or concerns.   Ellouise Newer, M.D.   CC: Dr. Brigitte Pulse

## 2020-09-04 DIAGNOSIS — I1310 Hypertensive heart and chronic kidney disease without heart failure, with stage 1 through stage 4 chronic kidney disease, or unspecified chronic kidney disease: Secondary | ICD-10-CM | POA: Diagnosis not present

## 2020-09-04 DIAGNOSIS — R251 Tremor, unspecified: Secondary | ICD-10-CM | POA: Diagnosis not present

## 2020-09-07 DIAGNOSIS — Z20822 Contact with and (suspected) exposure to covid-19: Secondary | ICD-10-CM | POA: Diagnosis not present

## 2020-09-08 ENCOUNTER — Encounter (HOSPITAL_COMMUNITY): Payer: Self-pay

## 2020-09-08 ENCOUNTER — Emergency Department (HOSPITAL_COMMUNITY)
Admission: EM | Admit: 2020-09-08 | Discharge: 2020-09-09 | Disposition: A | Discharge status: Home / Self Care | Payer: Medicare Other | Attending: Emergency Medicine | Admitting: Emergency Medicine

## 2020-09-08 ENCOUNTER — Other Ambulatory Visit: Payer: Self-pay

## 2020-09-08 ENCOUNTER — Emergency Department (HOSPITAL_COMMUNITY): Payer: Medicare Other

## 2020-09-08 DIAGNOSIS — Z79899 Other long term (current) drug therapy: Secondary | ICD-10-CM | POA: Insufficient documentation

## 2020-09-08 DIAGNOSIS — Z743 Need for continuous supervision: Secondary | ICD-10-CM | POA: Diagnosis not present

## 2020-09-08 DIAGNOSIS — N182 Chronic kidney disease, stage 2 (mild): Secondary | ICD-10-CM | POA: Insufficient documentation

## 2020-09-08 DIAGNOSIS — F0390 Unspecified dementia without behavioral disturbance: Secondary | ICD-10-CM | POA: Diagnosis not present

## 2020-09-08 DIAGNOSIS — I129 Hypertensive chronic kidney disease with stage 1 through stage 4 chronic kidney disease, or unspecified chronic kidney disease: Secondary | ICD-10-CM | POA: Diagnosis not present

## 2020-09-08 DIAGNOSIS — Z7982 Long term (current) use of aspirin: Secondary | ICD-10-CM | POA: Insufficient documentation

## 2020-09-08 DIAGNOSIS — Z794 Long term (current) use of insulin: Secondary | ICD-10-CM | POA: Diagnosis not present

## 2020-09-08 DIAGNOSIS — Z7984 Long term (current) use of oral hypoglycemic drugs: Secondary | ICD-10-CM | POA: Diagnosis not present

## 2020-09-08 DIAGNOSIS — E1122 Type 2 diabetes mellitus with diabetic chronic kidney disease: Secondary | ICD-10-CM | POA: Insufficient documentation

## 2020-09-08 DIAGNOSIS — R569 Unspecified convulsions: Secondary | ICD-10-CM | POA: Insufficient documentation

## 2020-09-08 DIAGNOSIS — R6889 Other general symptoms and signs: Secondary | ICD-10-CM | POA: Diagnosis not present

## 2020-09-08 DIAGNOSIS — R404 Transient alteration of awareness: Secondary | ICD-10-CM | POA: Diagnosis not present

## 2020-09-08 DIAGNOSIS — E860 Dehydration: Secondary | ICD-10-CM | POA: Diagnosis not present

## 2020-09-08 DIAGNOSIS — Z0389 Encounter for observation for other suspected diseases and conditions ruled out: Secondary | ICD-10-CM | POA: Diagnosis not present

## 2020-09-08 DIAGNOSIS — R000 Tachycardia, unspecified: Secondary | ICD-10-CM | POA: Diagnosis not present

## 2020-09-08 DIAGNOSIS — I499 Cardiac arrhythmia, unspecified: Secondary | ICD-10-CM | POA: Diagnosis not present

## 2020-09-08 DIAGNOSIS — R4020 Unspecified coma: Secondary | ICD-10-CM | POA: Diagnosis not present

## 2020-09-08 LAB — COMPREHENSIVE METABOLIC PANEL
ALT: 17 U/L (ref 0–44)
AST: 23 U/L (ref 15–41)
Albumin: 3.3 g/dL — ABNORMAL LOW (ref 3.5–5.0)
Alkaline Phosphatase: 57 U/L (ref 38–126)
Anion gap: 13 (ref 5–15)
BUN: 19 mg/dL (ref 8–23)
CO2: 24 mmol/L (ref 22–32)
Calcium: 9 mg/dL (ref 8.9–10.3)
Chloride: 103 mmol/L (ref 98–111)
Creatinine, Ser: 1.27 mg/dL — ABNORMAL HIGH (ref 0.44–1.00)
GFR, Estimated: 41 mL/min — ABNORMAL LOW (ref >60)
Glucose, Bld: 157 mg/dL — ABNORMAL HIGH (ref 70–99)
Potassium: 3.7 mmol/L (ref 3.5–5.1)
Sodium: 140 mmol/L (ref 135–145)
Total Bilirubin: 0.8 mg/dL (ref 0.3–1.2)
Total Protein: 6.3 g/dL — ABNORMAL LOW (ref 6.5–8.1)

## 2020-09-08 LAB — CBC
HCT: 37.9 % (ref 36.0–46.0)
Hemoglobin: 12.6 g/dL (ref 12.0–15.0)
MCH: 30.8 pg (ref 26.0–34.0)
MCHC: 33.2 g/dL (ref 30.0–36.0)
MCV: 92.7 fL (ref 80.0–100.0)
Platelets: 140 10*3/uL — ABNORMAL LOW (ref 150–400)
RBC: 4.09 MIL/uL (ref 3.87–5.11)
RDW: 13.8 % (ref 11.5–15.5)
WBC: 6 10*3/uL (ref 4.0–10.5)
nRBC: 0 % (ref 0.0–0.2)

## 2020-09-08 LAB — CBG MONITORING, ED: Glucose-Capillary: 152 mg/dL — ABNORMAL HIGH (ref 70–99)

## 2020-09-08 NOTE — ED Notes (Signed)
Carol Ford(Daughter#[phone]) called for an update on patient's status. Also would like to come visit patient.  Thank you

## 2020-09-08 NOTE — ED Triage Notes (Signed)
Pt from [facility] facility in [place] for possible seizure like activity while eating dinner. Staff states the pt became fixated and not responding, started to drool out of the left side of her mouth for about 1 min. No hx of seizures. Hx of dementia and is at baseline per staff. Hand tremors x 1 week.

## 2020-09-09 ENCOUNTER — Emergency Department (HOSPITAL_COMMUNITY): Payer: Medicare Other

## 2020-09-09 DIAGNOSIS — Z0389 Encounter for observation for other suspected diseases and conditions ruled out: Secondary | ICD-10-CM | POA: Diagnosis not present

## 2020-09-09 DIAGNOSIS — R000 Tachycardia, unspecified: Secondary | ICD-10-CM | POA: Diagnosis not present

## 2020-09-09 DIAGNOSIS — E860 Dehydration: Secondary | ICD-10-CM | POA: Diagnosis not present

## 2020-09-09 LAB — URINALYSIS, ROUTINE W REFLEX MICROSCOPIC
Bilirubin Urine: NEGATIVE
Glucose, UA: >=500 mg/dL — AB
Hgb urine dipstick: NEGATIVE
Ketones, ur: 5 mg/dL — AB
Leukocytes,Ua: NEGATIVE
Nitrite: NEGATIVE
Protein, ur: 30 mg/dL — AB
Specific Gravity, Urine: 1.023 (ref 1.005–1.030)
pH: 5 (ref 5.0–8.0)

## 2020-09-09 LAB — CBG MONITORING, ED
Glucose-Capillary: 402 mg/dL — ABNORMAL HIGH (ref 70–99)
Glucose-Capillary: 391 mg/dL — ABNORMAL HIGH (ref 70–99)

## 2020-09-09 MED ORDER — INSULIN ASPART 100 UNIT/ML ~~LOC~~ SOLN
8.0000 [IU] | Freq: Once | SUBCUTANEOUS | Status: AC
  Administered 2020-09-09: 8 [IU] via SUBCUTANEOUS

## 2020-09-09 MED ORDER — SODIUM CHLORIDE 0.9 % IV BOLUS
1000.0000 mL | Freq: Once | INTRAVENOUS | Status: AC
  Administered 2020-09-09: 1000 mL via INTRAVENOUS

## 2020-09-09 NOTE — ED Notes (Signed)
Patient transported to MRI 

## 2020-09-09 NOTE — ED Provider Notes (Signed)
[facility] EMERGENCY DEPARTMENT Provider Note   CSN: [id number] Arrival date & time: 09/08/20  1836     History Chief Complaint  Patient presents with  . Seizures    Kristen Bowman is a 87 y.o. female with PMHx dementia, diabetes, HTN, HLD,  who presents to the ED today via EMS with complaint of possible seizure like activity that occurred while at [facility] earlier today. Per triage report - pt was eating dinner when staff noticed that pt became fixated and not responding, starting to drool out of the left side of her mouth for about 1 min prompting them to call EMS. No history of seizures in the past.   Per patient she remembers eating dinner and the next thing she was on the ground "coming to." Pt states she remembers feeling concerned about what was happening however denies confusion. She denies trauma to the tongue or urinary incontinence as she is in the clothes she was wearing yesterday at dinner currently. Pt states other than being concerned she has no other complaints at this time. She does mention that for "awhile" now she has had increased urinary frequency. No dysuria. Pt states that the last time she had a similar episode like this she had a stroke. Per chart review pt was at [facility] in [place] for behavioral disturbances s/2 dementia when she was found to be unresponsive during hospitalization and found to have an acute stroke on CT/MRI.   Daughter at bedside states pt is at baseline for her currently however in the past couple of months daughter has noticed a slow decline with increased confusion.   The history is provided by the patient, a relative, the EMS personnel, medical records and the nursing home.       Past Medical History:  Diagnosis Date  . Anemia   . Anxiety states   . Diabetes mellitus    T2DM with renal manifestations  . Generalized osteoarthrosis, unspecified site   . Hematuria   . Hyperlipidemia    mixed  . Hypertension   .  Irritable bowel syndrome   . Other specified cardiac dysrhythmias(427.89)    atrial fibrilation  . Panic disorder   . Polymyalgia rheumatica ([place])   . Proteinuria 05/29/10  . Type II or unspecified type diabetes mellitus with renal manifestations, uncontrolled(250.42) 05/29/10   CKD II (mild)    Patient Active Problem List   Diagnosis Date Noted  . Dementia ([place]) 10/17/2019  . Pulmonary hypertension ([place]) 06/09/2014  . Tachycardia 09/24/2012  . Palpitations 09/24/2012  . CKD (chronic kidney disease), stage II 09/24/2012  . Anemia, iron deficiency 09/24/2012  . Hypertension   . Panic disorder   . Hyperlipidemia   . Type II or unspecified type diabetes mellitus with renal manifestations, uncontrolled(250.42) 05/29/2010    Past Surgical History:  Procedure Laterality Date  . CATARACT EXTRACTION     right  . COLONOSCOPY N/A 11/04/2012   Procedure: COLONOSCOPY;  Surgeon: Arta Silence, MD;  Location: [facility] ENDOSCOPY;  Service: Endoscopy;  Laterality: N/A;  . ESOPHAGOGASTRODUODENOSCOPY N/A 11/04/2012   Procedure: ESOPHAGOGASTRODUODENOSCOPY (EGD);  Surgeon: Arta Silence, MD;  Location: [facility];  Service: Endoscopy;  Laterality: N/A;  . TUBAL LIGATION       OB History   No obstetric history on file.     Family History  Problem Relation Age of Onset  . Diabetes Sister   . Cancer Other   . Heart attack Other   . Emphysema Mother   .  Leukemia Father     Social History   Tobacco Use  . Smoking status: Never Smoker  . Smokeless tobacco: Never Used  Vaping Use  . Vaping Use: Never used  Substance Use Topics  . Alcohol use: No  . Drug use: No    Home Medications Prior to Admission medications   Medication Sig Start Date End Date Taking? Authorizing Provider  acetaminophen (TYLENOL) 325 MG tablet Take 650 mg by mouth every 4 (four) hours as needed for moderate pain.   Yes [provider]  amLODipine (NORVASC) 2.5 MG tablet Take 2.5 mg by mouth daily. 06/02/20   Yes [provider]  aspirin 325 MG EC tablet Take 325 mg by mouth daily. 12/02/19 12/01/20 Yes [provider]  atorvastatin (LIPITOR) 40 MG tablet Take 40 mg by mouth daily.   Yes [provider]  clonazePAM (KLONOPIN) 0.5 MG tablet Take 0.25 mg by mouth at bedtime as needed for anxiety. 05/10/20  Yes [provider]  clonazePAM (KLONOPIN) 0.5 MG tablet Take 0.25 mg by mouth in the morning.   Yes [provider]  cyanocobalamin (,VITAMIN B-12,) 1000 MCG/ML injection Inject 1,000 mcg into the skin every Friday.   Yes [provider]  divalproex (DEPAKOTE) 250 MG DR tablet Take 1 tablet every night Patient taking differently: Take 250 mg by mouth 2 (two) times daily. 10/28/19  Yes Cameron Sprang, MD  donepezil (ARICEPT) 5 MG tablet Take 5 mg by mouth at bedtime. 12/01/19  Yes [provider]  insulin lispro (HUMALOG) 100 UNIT/ML KwikPen Inject 7 Units into the skin 3 (three) times daily before meals. 07/22/20  Yes [provider]  Insulin NPH, Human,, Isophane, (HUMULIN N KWIKPEN) 100 UNIT/ML Kiwkpen Inject 8-15 Units into the skin See admin instructions. Inject 15 units subcutaneously in the morning and inject 8 units subcutaneously at bedtime.   Yes [provider]  losartan (COZAAR) 100 MG tablet Take 100 mg by mouth daily.  08/08/16  Yes [provider]  metFORMIN (GLUCOPHAGE-XR) 500 MG 24 hr tablet Take 500 mg by mouth daily.  08/14/16  Yes [provider]  metoprolol tartrate (LOPRESSOR) 50 MG tablet Take 50 mg by mouth 2 (two) times daily. 08/24/20  Yes [provider]  risperiDONE (RISPERDAL) 1 MG tablet Take 1 mg by mouth at bedtime. 08/24/20  Yes [provider]    Allergies    Ace inhibitors and Tape  Review of Systems   Review of Systems  Constitutional: Negative for chills and fever.  Eyes: Negative for visual disturbance.  Respiratory: Negative for cough and shortness of  breath.   Cardiovascular: Negative for chest pain.  Gastrointestinal: Negative for abdominal pain, nausea and vomiting.  Genitourinary: Positive for frequency. Negative for dysuria.  Neurological: Negative for headaches.       Questionable seizure  All other systems reviewed and are negative.   Physical Exam Updated Vital Signs BP (!) 163/72 (BP Location: Right Arm)   Pulse 99   Temp 98.5 F (36.9 C) (Oral)   Resp 17   Ht 5\' 6"  (1.676 m)   SpO2 97%   BMI 25.66 kg/m   Physical Exam Vitals and nursing note reviewed.  Constitutional:      Appearance: She is not ill-appearing or diaphoretic.     Comments: Essential tremor appreciated  HENT:     Head: Normocephalic and atraumatic.  Eyes:     Extraocular Movements: Extraocular movements intact.     Conjunctiva/sclera: Conjunctivae  normal.     Pupils: Pupils are equal, round, and reactive to light.  Cardiovascular:     Rate and Rhythm: Normal rate and regular rhythm.     Pulses: Normal pulses.  Pulmonary:     Effort: Pulmonary effort is normal.     Breath sounds: Normal breath sounds. No wheezing, rhonchi or rales.  Abdominal:     Palpations: Abdomen is soft.     Tenderness: There is no abdominal tenderness. There is no guarding or rebound.  Musculoskeletal:     Cervical back: Neck supple.  Skin:    General: Skin is warm and dry.  Neurological:     Mental Status: She is alert.     Comments: Alert and oriented to self, place, and event. Pt thinks the year is 1922 however able to appropriately state that Barbette Or is the president.   Speech is fluent, clear without dysarthria or dysphasia.   Strength 5/5 in upper/lower extremities  Sensation intact in upper/lower extremities    No pronator drift.  Normal finger-to-nose and feet tapping.  CN I not tested  CN II grossly intact visual fields bilaterally. Did not visualize posterior eye.   CN III, IV, VI PERRLA and EOMs intact bilaterally  CN V Intact sensation to sharp and  light touch to the face  CN VII facial movements symmetric  CN VIII not tested  CN IX, X no uvula deviation, symmetric rise of soft palate  CN XI 5/5 SCM and trapezius strength bilaterally  CN XII Midline tongue protrusion, symmetric L/R movements      ED Results / Procedures / Treatments   Labs (all labs ordered are listed, but only abnormal results are displayed) Labs Reviewed  COMPREHENSIVE METABOLIC PANEL - Abnormal; Notable for the following components:      Result Value   Glucose, Bld 157 (*)    Creatinine, Ser 1.27 (*)    Total Protein 6.3 (*)    Albumin 3.3 (*)    GFR, Estimated 41 (*)    All other components within normal limits  CBC - Abnormal; Notable for the following components:   Platelets 140 (*)    All other components within normal limits  URINALYSIS, ROUTINE W REFLEX MICROSCOPIC - Abnormal; Notable for the following components:   Glucose, UA >=500 (*)    Ketones, ur 5 (*)    Protein, ur 30 (*)    Bacteria, UA RARE (*)    All other components within normal limits  CBG MONITORING, ED - Abnormal; Notable for the following components:   Glucose-Capillary 152 (*)    All other components within normal limits  CBG MONITORING, ED - Abnormal; Notable for the following components:   Glucose-Capillary 402 (*)    All other components within normal limits  CBG MONITORING, ED - Abnormal; Notable for the following components:   Glucose-Capillary 391 (*)    All other components within normal limits    EKG EKG Interpretation  Date/Time:  Friday September 08 2020 18:42:46 EST Ventricular Rate:  114 PR Interval:  162 QRS Duration: 118 QT Interval:  362 QTC Calculation: 498 R Axis:   161 Text Interpretation: Sinus tachycardia with Fusion complexes Right bundle branch block Left posterior fascicular block bifasciular block Abnormal ECG Since last tracing rate faster Confirmed by Noemi Chapel [id number]) on 09/08/2020 9:37:43 PM   Radiology CT Head Wo Contrast  Result  Date: 09/08/2020 CLINICAL DATA:  Possible seizure like activity about eating dinner, patient became fixated and nonresponsive. EXAM: CT  HEAD WITHOUT CONTRAST TECHNIQUE: Contiguous axial images were obtained from the base of the skull through the vertex without intravenous contrast. COMPARISON:  Multiple priors including head CT November 13, 2019. FINDINGS: Brain: Similar atrophy, ex vacuo dilatation of the ventricular system, and changes of chronic small vessel disease. No acute intracranial abnormality. Specifically no hemorrhage, hydrocephalus, mass lesion, or significant intracranial injury. Vascular: No hyperdense vessel or unexpected calcification. Skull: Normal. Negative for fracture or focal lesion. Sinuses/Orbits: Visualized paranasal sinuses and mastoids clear. Orbital soft tissues unremarkable. Other: None. IMPRESSION: 1. No acute intracranial abnormality. 2. Similar atrophy and changes of chronic small vessel disease. Electronically Signed   By: Dahlia Bailiff MD   On: 09/08/2020 19:29   MR BRAIN WO CONTRAST  Result Date: 09/09/2020 CLINICAL DATA:  Questionable seizure versus syncopal episode. EXAM: MRI HEAD WITHOUT CONTRAST TECHNIQUE: Multiplanar, multiecho pulse sequences of the brain and surrounding structures were obtained without intravenous contrast. COMPARISON:  CT head September 08, 2020. FINDINGS: Brain: No acute infarction, hemorrhage, hydrocephalus, extra-axial collection or mass lesion. Mild T2/FLAIR hyperintensities in the white matter, likely related to chronic microvascular ischemic disease. Moderate generalized cerebral atrophy with ex vacuo ventricular dilation. Vascular: Major arterial flow voids are maintained at the skull base. Skull and upper cervical spine: Normal marrow signal. Sinuses/Orbits: Clear sinuses.  Unremarkable orbits. Other: No mastoid effusions. IMPRESSION: 1. No evidence of acute intracranial abnormality.  No acute infarct. 2. Moderate generalized cerebral atrophy.  Electronically Signed   By: Margaretha Sheffield MD   On: 09/09/2020 12:41    Procedures Procedures (including critical care time)  Medications Ordered in ED Medications  sodium chloride 0.9 % bolus 1,000 mL (0 mLs Intravenous Stopped 09/09/20 0945)  insulin aspart (novoLOG) injection 8 Units (8 Units Subcutaneous Given 09/09/20 1420)    ED Course  I have reviewed the triage vital signs and the nursing notes.  Pertinent labs & imaging results that were available during my care of the patient were reviewed by me and considered in my medical decision making (see chart for details).    MDM Rules/Calculators/A&P                          87 year old female who presents to the ED today from [facility] facility for a questionable seizure.  No history of same.  Was eating dinner when she was found to be fixated and slumped over, drooling for 1 minute with "arm jerking", placed on the ground and then came to.  No postictal state.  No urinary incontinence or trauma to the tongue.  On arrival to the ED vitals include patient being mildly tachycardic in the 110s, afebrile, nontachypneic.  EKG in triage with sinus tachycardia with known right bundle/left fascicular block.  CT head done while in the waiting room which did not show any acute findings.  Lab work including a CBC and CMP unremarkable.  CBG 152.  On my exam patient is well-appearing.  She is eating with daughter at bedside who states she is acting at her baseline.  She does have a history of dementia, is alert and oriented to person, place, situation.  Believes it is [place] however able to tell me that Barbette Or is the president.  She has no focal neurodeficits on exam today.  She is known to have an essential tremor that is appreciated.  No other complaints at this time.  She denies any prodrome including shortness of breath, chest pain, lightheadedness, dizziness.  There is no obvious tongue lacerations and no signs of urinary incontinence  given patient is in the close that she was wearing at the time of the event.  She does mention that she had similar symptoms in the past of a presyncopal versus syncopal episode and was found to have an acute stroke.  It does appear that she was admitted to [facility] in March 2021 for behavioral disturbances secondary to dementia, had an episode of unresponsiveness with a CT head findings of an acute stroke with confirmed MRI results.  Also mention she had some increased urinary frequency as of late.  We will plan for a UA to assess for infection.  We will also plan for orthostatic vital signs as this may have caused patient's presyncope versus syncopal episode.  I do have lower suspicion for seizure however given this would be patient's first seizure no other interventions required at this time.  Will obtain MRI given history of similar episode with acute stroke. She does have a neurologist that she sees for her dementia and stroke, will have her follow-up with same.    Orthostatics positive. Fluids provided.  Orthostatic Lying  BP- Lying:151/67 Pulse- Lying:88 Orthostatic Sitting BP- Sitting:185/60 Pulse- Sitting:96 Orthostatic Standing at 0 minutes BP- Standing at 0 minutes:126/98 (!) Pulse- Standing at 0 minutes:115  U/A negative for infection  MRI negative for acute stroke or other acute abnormalities at this time  CBG obtained as daughter was concerned regarding pt's sugars - 402. Insulin provided. Will plan to recheck sugars soon. If decreasing will discharge pt home to SNF with close PCP and neurology follow up. Daughter in agreement with plan.   Repeat CBG 391 however pt has been eating and drinking. She has been here for almost a full 24 hours without her regular insulin which I suspect is the cause of her hyperglycemia. Stable for discharge at this time.   This note was prepared using Dragon voice recognition software and may include unintentional dictation errors due to the  inherent limitations of voice recognition software.  Final Clinical Impression(s) / ED Diagnoses Final diagnoses:  Seizure-like activity [facility])  Dehydration    Rx / DC Orders ED Discharge Orders    None       Eustaquio Maize, PA-C 09/09/20 1540    Truddie Hidden, MD 09/10/20 [phone]

## 2020-09-09 NOTE — ED Notes (Signed)
Daughter at bedside.

## 2020-09-09 NOTE — Discharge Instructions (Addendum)
Please follow up with both your PCP and neurologist regarding your ED visit today. Your workup was reassuring besides some signs of dehydration which fluids were provided for.   Return to the ED for any worsening symptoms

## 2020-09-09 NOTE — ED Notes (Signed)
Patient verbalizes understanding of discharge instructions. Opportunity for questioning and answers were provided. Armband removed by staff, pt discharged from ED with daughter to go to NH.

## 2020-09-09 NOTE — ED Notes (Signed)
Pt stated she "felt kind of woozy when she stood up"

## 2020-09-09 NOTE — ED Notes (Signed)
PT CBG 402. RN [employer].

## 2020-09-14 DIAGNOSIS — Z20822 Contact with and (suspected) exposure to covid-19: Secondary | ICD-10-CM | POA: Diagnosis not present

## 2020-09-16 DIAGNOSIS — E1165 Type 2 diabetes mellitus with hyperglycemia: Secondary | ICD-10-CM | POA: Diagnosis not present

## 2020-09-21 DIAGNOSIS — N179 Acute kidney failure, unspecified: Secondary | ICD-10-CM | POA: Diagnosis not present

## 2020-09-21 DIAGNOSIS — Z20822 Contact with and (suspected) exposure to covid-19: Secondary | ICD-10-CM | POA: Diagnosis not present

## 2020-09-28 DIAGNOSIS — Z20822 Contact with and (suspected) exposure to covid-19: Secondary | ICD-10-CM | POA: Diagnosis not present

## 2020-10-17 DIAGNOSIS — E1165 Type 2 diabetes mellitus with hyperglycemia: Secondary | ICD-10-CM | POA: Diagnosis not present

## 2020-10-18 DIAGNOSIS — E1165 Type 2 diabetes mellitus with hyperglycemia: Secondary | ICD-10-CM | POA: Diagnosis not present

## 2020-10-24 DIAGNOSIS — Z20822 Contact with and (suspected) exposure to covid-19: Secondary | ICD-10-CM | POA: Diagnosis not present

## 2020-10-31 DIAGNOSIS — Z20822 Contact with and (suspected) exposure to covid-19: Secondary | ICD-10-CM | POA: Diagnosis not present

## 2020-11-14 DIAGNOSIS — E1165 Type 2 diabetes mellitus with hyperglycemia: Secondary | ICD-10-CM | POA: Diagnosis not present

## 2020-11-15 DIAGNOSIS — E1165 Type 2 diabetes mellitus with hyperglycemia: Secondary | ICD-10-CM | POA: Diagnosis not present

## 2020-11-20 DIAGNOSIS — R410 Disorientation, unspecified: Secondary | ICD-10-CM | POA: Diagnosis not present

## 2020-11-20 DIAGNOSIS — E1165 Type 2 diabetes mellitus with hyperglycemia: Secondary | ICD-10-CM | POA: Diagnosis not present

## 2020-11-20 DIAGNOSIS — R55 Syncope and collapse: Secondary | ICD-10-CM | POA: Diagnosis not present

## 2020-11-20 DIAGNOSIS — I10 Essential (primary) hypertension: Secondary | ICD-10-CM | POA: Diagnosis not present

## 2020-11-20 DIAGNOSIS — R404 Transient alteration of awareness: Secondary | ICD-10-CM | POA: Diagnosis not present

## 2020-12-01 DIAGNOSIS — E119 Type 2 diabetes mellitus without complications: Secondary | ICD-10-CM | POA: Diagnosis not present

## 2020-12-01 DIAGNOSIS — Z794 Long term (current) use of insulin: Secondary | ICD-10-CM | POA: Diagnosis not present

## 2020-12-01 DIAGNOSIS — R55 Syncope and collapse: Secondary | ICD-10-CM | POA: Diagnosis not present

## 2020-12-17 DIAGNOSIS — E1165 Type 2 diabetes mellitus with hyperglycemia: Secondary | ICD-10-CM | POA: Diagnosis not present

## 2020-12-27 DIAGNOSIS — Z79899 Other long term (current) drug therapy: Secondary | ICD-10-CM | POA: Diagnosis not present

## 2020-12-27 DIAGNOSIS — R9431 Abnormal electrocardiogram [ECG] [EKG]: Secondary | ICD-10-CM | POA: Diagnosis not present

## 2020-12-27 DIAGNOSIS — N390 Urinary tract infection, site not specified: Secondary | ICD-10-CM | POA: Diagnosis not present

## 2021-01-02 DIAGNOSIS — Z79899 Other long term (current) drug therapy: Secondary | ICD-10-CM | POA: Diagnosis not present

## 2021-01-02 DIAGNOSIS — Z20822 Contact with and (suspected) exposure to covid-19: Secondary | ICD-10-CM | POA: Diagnosis not present

## 2021-01-02 DIAGNOSIS — J9811 Atelectasis: Secondary | ICD-10-CM | POA: Diagnosis not present

## 2021-01-02 DIAGNOSIS — I700 Atherosclerosis of aorta: Secondary | ICD-10-CM | POA: Diagnosis not present

## 2021-01-02 DIAGNOSIS — R000 Tachycardia, unspecified: Secondary | ICD-10-CM | POA: Diagnosis not present

## 2021-01-02 DIAGNOSIS — E119 Type 2 diabetes mellitus without complications: Secondary | ICD-10-CM | POA: Diagnosis not present

## 2021-01-02 DIAGNOSIS — N189 Chronic kidney disease, unspecified: Secondary | ICD-10-CM | POA: Diagnosis not present

## 2021-01-02 DIAGNOSIS — E1165 Type 2 diabetes mellitus with hyperglycemia: Secondary | ICD-10-CM | POA: Diagnosis not present

## 2021-01-02 DIAGNOSIS — D649 Anemia, unspecified: Secondary | ICD-10-CM | POA: Diagnosis not present

## 2021-01-02 DIAGNOSIS — K429 Umbilical hernia without obstruction or gangrene: Secondary | ICD-10-CM | POA: Diagnosis not present

## 2021-01-02 DIAGNOSIS — E1122 Type 2 diabetes mellitus with diabetic chronic kidney disease: Secondary | ICD-10-CM | POA: Diagnosis not present

## 2021-01-02 DIAGNOSIS — Z794 Long term (current) use of insulin: Secondary | ICD-10-CM | POA: Diagnosis not present

## 2021-01-02 DIAGNOSIS — R509 Fever, unspecified: Secondary | ICD-10-CM | POA: Diagnosis not present

## 2021-01-02 DIAGNOSIS — G928 Other toxic encephalopathy: Secondary | ICD-10-CM | POA: Diagnosis not present

## 2021-01-02 DIAGNOSIS — R404 Transient alteration of awareness: Secondary | ICD-10-CM | POA: Diagnosis not present

## 2021-01-02 DIAGNOSIS — R0689 Other abnormalities of breathing: Secondary | ICD-10-CM | POA: Diagnosis not present

## 2021-01-02 DIAGNOSIS — Z7984 Long term (current) use of oral hypoglycemic drugs: Secondary | ICD-10-CM | POA: Diagnosis not present

## 2021-01-02 DIAGNOSIS — I129 Hypertensive chronic kidney disease with stage 1 through stage 4 chronic kidney disease, or unspecified chronic kidney disease: Secondary | ICD-10-CM | POA: Diagnosis not present

## 2021-01-02 DIAGNOSIS — A419 Sepsis, unspecified organism: Secondary | ICD-10-CM | POA: Diagnosis not present

## 2021-01-02 DIAGNOSIS — D631 Anemia in chronic kidney disease: Secondary | ICD-10-CM | POA: Diagnosis not present

## 2021-01-02 DIAGNOSIS — R6520 Severe sepsis without septic shock: Secondary | ICD-10-CM | POA: Diagnosis not present

## 2021-01-02 DIAGNOSIS — R109 Unspecified abdominal pain: Secondary | ICD-10-CM | POA: Diagnosis not present

## 2021-01-03 DIAGNOSIS — G928 Other toxic encephalopathy: Secondary | ICD-10-CM | POA: Diagnosis not present

## 2021-01-14 DIAGNOSIS — E1165 Type 2 diabetes mellitus with hyperglycemia: Secondary | ICD-10-CM | POA: Diagnosis not present

## 2021-01-16 DIAGNOSIS — I10 Essential (primary) hypertension: Secondary | ICD-10-CM | POA: Diagnosis not present

## 2021-01-16 DIAGNOSIS — E119 Type 2 diabetes mellitus without complications: Secondary | ICD-10-CM | POA: Diagnosis not present

## 2021-01-16 DIAGNOSIS — E782 Mixed hyperlipidemia: Secondary | ICD-10-CM | POA: Diagnosis not present

## 2021-01-16 DIAGNOSIS — E039 Hypothyroidism, unspecified: Secondary | ICD-10-CM | POA: Diagnosis not present

## 2021-01-29 ENCOUNTER — Encounter: Payer: Self-pay | Admitting: Neurology

## 2021-01-29 ENCOUNTER — Ambulatory Visit: Payer: Medicare Other | Admitting: Neurology

## 2021-01-29 ENCOUNTER — Other Ambulatory Visit: Payer: Self-pay

## 2021-01-29 VITALS — BP 127/76 | HR 106

## 2021-01-29 DIAGNOSIS — G20 Parkinson's disease: Secondary | ICD-10-CM

## 2021-01-29 DIAGNOSIS — F0391 Unspecified dementia with behavioral disturbance: Secondary | ICD-10-CM

## 2021-01-29 DIAGNOSIS — R531 Weakness: Secondary | ICD-10-CM

## 2021-01-29 DIAGNOSIS — R569 Unspecified convulsions: Secondary | ICD-10-CM | POA: Diagnosis not present

## 2021-01-29 DIAGNOSIS — F03B18 Unspecified dementia, moderate, with other behavioral disturbance: Secondary | ICD-10-CM

## 2021-01-29 NOTE — Progress Notes (Signed)
Patient was seen, evaluated, and treatment plan was discussed with the Advanced Practice Provider. Records reviewed, she was in the ER in 08/2020 for seizure-like activity, she was eating then became fixated, not responding, drooling on the left side of her mouth with "arm jerking." Glucose was 157 in the ER, BP 163/72. She was orthostatic. MRI brain no acute changes, there was moderate diffuse atrophy. She was back in the ER in 10/2020 again while eating breakfast, she felt weak and passed out. Evaluation unremarkable in the ER, head CT no acute changes. Her son notes a big change in the past 2 months, he is concerned about right eyelid drooping, increased tremors, syncopal episodes. She is now unable to ambulate and does not like to use her walker. Hand tremors are new from her last visit. There is now a resting tremor R>L, cogwheeling and decreased finger taps, concerning for parkinsonism. No clear ptosis, there appears to be mild swelling under the right eyelid. Shallow left nasolabial fold. Parkinsonism may be related to her medications. Head CT without contrast and EEG will be ordered.  I have also reviewed the orders written for this patient which were under my direction. I agree with the findings and the plan of care as documented by the Advanced Practice Provider.

## 2021-01-29 NOTE — Progress Notes (Signed)
Assessment/Plan:    Moderate dementia with behavioral disturbance  87 year old right-handed woman with a complex history, including history of moderate dementia with behavioral disturbance, seen today in follow-up. MMSE today is incomplete, she could not finish the test.  It is unclear if cognitive decline is due to underlying dementia versus medication side effect versus questionable recent cerebrovascular event in view of right-sided weakness noted on exam. There was also report of possible seizure activity.  She has new resting tremor, unclear if this is drug related.  Patient continues to be on donepezil 5 mg daily, Depakote DR 250 mg twice daily, Risperdal 1 mg nightly, and clonazepam as needed.  Recommendations are as follows  . Discussed safety both in and out of the home.  Recommend that the patient use a walker for ambulation to prevent falls . Continue to monitor mood by PCP . Continue donepezil  5 mg daily Side effects  were discussed, which include nausea, vomiting, diarrhea, vivid dreams, and muscle cramps.   . Continue Depakote, Risperdal as prescribed . EEG as part of seizure workup . CT head without contrast to evaluate for underlying structural abnormality . Follow up in 3 months for closer monitoring   Case discussed with Dr. Delice Lesch who agrees with the plan    Subjective:   Kristen Bowman is an 87 year old right-handed woman with a history of hypertension, hyperlipidemia, diabetes, PMR, and with moderate dementia with behavioral disturbance seen today in follow up for memory loss.  She was last evaluated on 06/09/2020, at which time MMSE was 25/30.  Since living in [place], in a more stable environment, she has been fairly stable.  She is currently on Donepezil 5mg  daily, Depakote DR 250mg  bid, and Risperdal 1mg  qhs and clonazepam as needed.  This patient is accompanied in the office by her son who supplements the history.  Previous records as well as any outside  records available were reviewed prior to todays visit.   Since her last visit, she had several ED presentations related to hypertension, seizure like activity in January of this year  with negative work-up, one visit for syncope on 11/20/2020, and on Jan 02, 2021 for AMS and fever but with negative studies which included CT head, CT A/P, UA, CBC and neg Covid/Influenza A/B.  It is unclear in the available notes if the patient had a UTI in the interim. The patient is able to answer questions, but the time to answer is increased according to her son.  He states that 2 months ago, the patient was more energetic, moving around easier at [place], was able to participate in ADLs to her abilities, was playing bingo and was going to church.  Now, he says that "she is a room, being fed, and not participating at all".  He also adds that the Monday following Mother's Day, she had a fourth booster, and a day later she had to present to the ED with AMS.  No apparent changes in the outpatient medications during the last visit to the ED  The patient and the son denies any any hallucinations or paranoia.  She reports sleeping "not too well ", but denies vivid dreams or sleepwalking.  She dresses up and bathes and receives medications with assistance; however, she refuses to use a walker to ambulate, despite her son insisting that she should use it for obvious safety reasons. Her appetite is good, and denies trouble swallowing.  She denies headaches, trauma, injuries to the head, double vision, but she  does have mild right ptosis according to the son. The patient denies any dizziness, focal numbness or tingling, unilateral weakness, she has resting tremors, which son reports has been more pronounced over the last month. She also is incontinent of urine and wears a diaper.  Denies constipation or diarrhea.    History on Initial Assessment 10/28/2019: This is an 87 year old right-handed woman with a history of hypertension,  hyperlipidemia, diabetes, PMR, presenting for evaluation of memory loss. She lives with her son [name]. Her daughter [name] lives next door. She feels her memory is not good, she does not remember a lot. [name] started noticing changes in 2019 while they were at an [employer] in [place]. Two days after they got home, she started saying that he had not taken her to the beach in a couple of years. He feels there was a big drastic change since then. He has been living with her for the past 12 years, making sure she eats and administering her insulin, but saying she was always pretty self-sufficient. In October 2020, he found her outside in the backyard, very confused, with a flipflop on one foot and a boot in another foot. She was hallucinating, thinking she was in [facility]. Since then, symptoms progressed that he felt she needed IVC last 10/16/19. She was wandering outside, talking to her deceased father. Prior to this, she went missing earlier in the week. She was not sleeping. There is no prior psychiatric history except for panic attacks in the 1990s. She has been taking clonazepam qhs for several years. This past weekend she would repeatedly say she is not home and she has to get out of there. They had to change the locks in the house to keep her inside. On Tuesday night she went outside with her daughter, she was out from 2pm until she got tired at 8pm. She states she still cooks, [name] fixes meals. [name] reports some paranoia. She stopped driving in September 2020. Her paternal aunt and father had memory issues. No significant head injuries or alcohol use. She denies any headaches, dizziness, diplopia, dysarthria/dysphagia, neck pain, focal numbness/tingling/weakness, no falls. She has occasional urinary incontinence. She needs assistance with bathing, getting confused with the water and temperature.    I personally reviewed head CT without contrast done 09/2019 which did not show any acute changes. There  was moderate atrophy and mild chronic microvascular disease.   Most recent MRI brain without contrast 09/09/20 No evidence of acute intracranial abnormality.  No acute infarct.  . Moderate generalized cerebral atrophy  CT head without Contrast 01/02/21 Mild atrophy with slight periventricular small vessel disease. No acute infarct. No mass or hemorrhage. There are foci of arterial vascular calcification. Small retention cyst in left ethmoid sinus noted.    PREVIOUS MEDICATIONS: none  CURRENT MEDICATIONS:  Outpatient Encounter Medications as of 01/29/2021  Medication Sig  . acetaminophen (TYLENOL) 325 MG tablet Take 650 mg by mouth every 4 (four) hours as needed for moderate pain.  Marland Kitchen amLODipine (NORVASC) 2.5 MG tablet Take 2.5 mg by mouth daily.  Marland Kitchen atorvastatin (LIPITOR) 40 MG tablet Take 40 mg by mouth daily.  . clonazePAM (KLONOPIN) 0.5 MG tablet Take 0.25 mg by mouth at bedtime as needed for anxiety.  . clonazePAM (KLONOPIN) 0.5 MG tablet Take 0.25 mg by mouth in the morning.  . cyanocobalamin (,VITAMIN B-12,) 1000 MCG/ML injection Inject 1,000 mcg into the skin every Friday.  . divalproex (DEPAKOTE) 250 MG DR tablet Take  1 tablet every night (Patient taking differently: Take 250 mg by mouth 2 (two) times daily.)  . donepezil (ARICEPT) 5 MG tablet Take 5 mg by mouth at bedtime.  . insulin lispro (HUMALOG) 100 UNIT/ML KwikPen Inject 7 Units into the skin 3 (three) times daily before meals.  . Insulin NPH, Human,, Isophane, (HUMULIN N KWIKPEN) 100 UNIT/ML Kiwkpen Inject 8-15 Units into the skin See admin instructions. Inject 15 units subcutaneously in the morning and inject 8 units subcutaneously at bedtime.  Marland Kitchen losartan (COZAAR) 100 MG tablet Take 100 mg by mouth daily.   . metFORMIN (GLUCOPHAGE-XR) 500 MG 24 hr tablet Take 500 mg by mouth daily.   . metoprolol tartrate (LOPRESSOR) 50 MG tablet Take 50 mg by mouth 2 (two) times daily.  . risperiDONE (RISPERDAL) 1 MG tablet Take 1 mg by mouth  at bedtime.   No facility-administered encounter medications on file as of 01/29/2021.     Objective:     PHYSICAL EXAMINATION:    VITALS:   Vitals:   01/29/21 1516  BP: 127/76  Pulse: (!) 106  SpO2: 97%    GEN:  The patient appears stated age and is in NAD. HEENT:  Normocephalic, atraumatic.   Neurological examination:  General: NAD, well-groomed, appears stated age. Orientation: The patient is alert. Oriented to person, not to place or date     Cranial nerves: There is good facial symmetry.The speech is not fluent, but clear. Voice is soft . No aphasia or dysarthria. She does have L sided mild facial droop more noticeable when smiling, no tremor noticed in her tongue.  Fund of knowledge is reduced.  recent and remote memory are impaired. Attention and concentration are reduced.  Reviewed able to name objects and repeat phrases.  Hearing is intact to conversational tone.    Sensation: Sensation is intact to light touch throughout Motor: Strength is at least antigravity x4.  No flowsheet data found.   MMSE - Mini Mental State Exam 01/29/2021 06/09/2020  Not completed: (No Data) -  Orientation to time 0 3  Orientation to Place 3 5  Registration 1 3  Attention/ Calculation 0 5  Recall - 1  Language- name 2 objects - 2  Language- repeat - 1  Language- follow 3 step command - 2  Language- read & follow direction - 1  Write a sentence - 1  Copy design - 1  Total score - 25      Movement examination: Tone: There is normal tone in the UE/LE Abnormal movements: Right greater than left tremor.  No myoclonus.  No asterixis.  Right greater than left cogwheel due to rigidity. Coordination:  There is mild decremation with RAM's. Normal finger to nose  Gait and Station: The patient has  difficulty arising out of a deep-seated chair without the use of the hands.  Unable to assess her gait and her stride  CBC CBC Latest Ref Rng & Units 09/08/2020 01/26/2020 11/12/2019  WBC 4.0 - 10.5  K/uL 6.0 6.0 5.2  Hemoglobin 12.0 - 15.0 g/dL 12.6 11.9(L) 13.2  Hematocrit 36.0 - 46.0 % 37.9 36.6 41.0  Platelets 150 - 400 K/uL 140(L) 167 155     CMP Latest Ref Rng & Units 09/08/2020 01/26/2020 11/12/2019  Glucose 70 - 99 mg/dL 157(H) 240(H) 92  BUN 8 - 23 mg/dL 19 15 10   Creatinine 0.44 - 1.00 mg/dL 1.27(H) 1.06(H) 1.20(H)  Sodium 135 - 145 mmol/L 140 142 144  Potassium 3.5 - 5.1 mmol/L 3.7  3.5 3.5  Chloride 98 - 111 mmol/L 103 106 106  CO2 22 - 32 mmol/L 24 24 25   Calcium 8.9 - 10.3 mg/dL 9.0 9.0 8.9  Total Protein 6.5 - 8.1 g/dL 6.3(L) - 6.6  Total Bilirubin 0.3 - 1.2 mg/dL 0.8 - 1.0  Alkaline Phos 38 - 126 U/L 57 - 68  AST 15 - 41 U/L 23 - 19  ALT 0 - 44 U/L 17 - 16       Total time spent on today's visit was 44minutes, including both face-to-face time and nonface-to-face time.  Time included that spent on review of records (prior notes available to me/labs/imaging if pertinent), discussing treatment and goals, answering patient's questions and coordinating care.  Cc:  Mayra Neer, MD Sharene Butters, PA-C

## 2021-01-29 NOTE — Patient Instructions (Addendum)
It was a pleasure to see you today at our office.   Recommendations:  Follow up in 3  months Continue Donepezil 5 mg daily Will arrange for EEG to evaluate for seizures  Continue Depakote DR 250 mg twice daily as before CT head without contrast for evaluation of Right sided weakness   RECOMMENDATIONS FOR ALL PATIENTS WITH MEMORY PROBLEMS: 1. Continue to exercise (Recommend 30 minutes of walking everyday, or 3 hours every week) 2. Increase social interactions - continue going to [place] and enjoy social gatherings with friends and family 3. Eat healthy, avoid fried foods and eat more fruits and vegetables 4. Maintain adequate blood pressure, blood sugar, and blood cholesterol level. Reducing the risk of stroke and cardiovascular disease also helps promoting better memory. 5. Avoid stressful situations. Live a simple life and avoid aggravations. Organize your time and prepare for the next day in anticipation. 6. Sleep well, avoid any interruptions of sleep and avoid any distractions in the bedroom that may interfere with adequate sleep quality 7. Avoid sugar, avoid sweets as there is a strong link between excessive sugar intake, diabetes, and cognitive impairment We discussed the Mediterranean diet, which has been shown to help patients reduce the risk of progressive memory disorders and reduces cardiovascular risk. This includes eating fish, eat fruits and green leafy vegetables, nuts like almonds and hazelnuts, walnuts, and also use olive oil. Avoid fast foods and fried foods as much as possible. Avoid sweets and sugar as sugar use has been linked to worsening of memory function.  There is always a concern of gradual progression of memory problems. If this is the case, then we may need to adjust level of care according to patient needs. Support, both to the patient and caregiver, should then be put into place.      FALL PRECAUTIONS: Be cautious when walking. Scan the area for obstacles that  may increase the risk of trips and falls. When getting up in the mornings, sit up at the edge of the bed for a few minutes before getting out of bed. Consider elevating the bed at the head end to avoid drop of blood pressure when getting up. Walk always in a well-lit room (use night lights in the walls). Avoid area rugs or power cords from appliances in the middle of the walkways. Use a walker or a cane if necessary and consider physical therapy for balance exercise. Get your eyesight checked regularly.  FINANCIAL OVERSIGHT: Supervision, especially oversight when making financial decisions or transactions is also recommended.  HOME SAFETY: Consider the safety of the kitchen when operating appliances like stoves, microwave oven, and blender. Consider having supervision and share cooking responsibilities until no longer able to participate in those. Accidents with firearms and other hazards in the house should be identified and addressed as well.   ABILITY TO BE LEFT ALONE: If patient is unable to contact 911 operator, consider using LifeLine, or when the need is there, arrange for someone to stay with patients. Smoking is a fire hazard, consider supervision or cessation. Risk of wandering should be assessed by caregiver and if detected at any point, supervision and safe proof recommendations should be instituted.  MEDICATION SUPERVISION: Inability to self-administer medication needs to be constantly addressed. Implement a mechanism to ensure safe administration of the medications.       Mediterranean Diet A Mediterranean diet refers to food and lifestyle choices that are based on the traditions of countries located on the [employer]. This way of eating  has been shown to help prevent certain conditions and improve outcomes for people who have chronic diseases, like kidney disease and heart disease. What are tips for following this plan? Lifestyle   Cook and eat meals together with your  family, when possible.  Drink enough fluid to keep your urine clear or pale yellow.  Be physically active every day. This includes:  Aerobic exercise like running or swimming.  Leisure activities like gardening, walking, or housework.  Get 7-8 hours of sleep each night.  If recommended by your health care provider, drink red wine in moderation. This means 1 glass a day for nonpregnant women and 2 glasses a day for men. A glass of wine equals 5 oz (150 mL). Reading food labels   Check the serving size of packaged foods. For foods such as rice and pasta, the serving size refers to the amount of cooked product, not dry.  Check the total fat in packaged foods. Avoid foods that have saturated fat or trans fats.  Check the ingredients list for added sugars, such as corn syrup. Shopping   At the grocery store, buy most of your food from the areas near the walls of the store. This includes:  Fresh fruits and vegetables (produce).  Grains, beans, nuts, and seeds. Some of these may be available in unpackaged forms or large amounts (in bulk).  Fresh seafood.  Poultry and eggs.  Low-fat dairy products.  Buy whole ingredients instead of prepackaged foods.  Buy fresh fruits and vegetables in-season from local farmers markets.  Buy frozen fruits and vegetables in resealable bags.  If you do not have access to quality fresh seafood, buy precooked frozen shrimp or canned fish, such as tuna, salmon, or sardines.  Buy small amounts of raw or cooked vegetables, salads, or olives from the deli or salad bar at your store.  Stock your pantry so you always have certain foods on hand, such as olive oil, canned tuna, canned tomatoes, rice, pasta, and beans. Cooking   Cook foods with extra-virgin olive oil instead of using butter or other vegetable oils.  Have meat as a side dish, and have vegetables or grains as your main dish. This means having meat in small portions or adding small amounts  of meat to foods like pasta or stew.  Use beans or vegetables instead of meat in common dishes like chili or lasagna.  Experiment with different cooking methods. Try roasting or broiling vegetables instead of steaming or sauteing them.  Add frozen vegetables to soups, stews, pasta, or rice.  Add nuts or seeds for added healthy fat at each meal. You can add these to yogurt, salads, or vegetable dishes.  Marinate fish or vegetables using olive oil, lemon juice, garlic, and fresh herbs. Meal planning   Plan to eat 1 vegetarian meal one day each week. Try to work up to 2 vegetarian meals, if possible.  Eat seafood 2 or more times a week.  Have healthy snacks readily available, such as:  Vegetable sticks with hummus.  Greek yogurt.  Fruit and nut trail mix.  Eat balanced meals throughout the week. This includes:  Fruit: 2-3 servings a day  Vegetables: 4-5 servings a day  Low-fat dairy: 2 servings a day  Fish, poultry, or lean meat: 1 serving a day  Beans and legumes: 2 or more servings a week  Nuts and seeds: 1-2 servings a day  Whole grains: 6-8 servings a day  Extra-virgin olive oil: 3-4 servings a day  Limit red meat and sweets to only a few servings a month What are my food choices?  Mediterranean diet  Recommended  Grains: Whole-grain pasta. Brown rice. Bulgar wheat. Polenta. Couscous. Whole-wheat bread. [place].  Vegetables: Artichokes. Beets. Broccoli. Cabbage. Carrots. Eggplant. Green beans. Chard. Kale. Spinach. Onions. Leeks. Peas. Squash. Tomatoes. Peppers. Radishes.  Fruits: Apples. Apricots. Avocado. Berries. Bananas. Cherries. Dates. Figs. Grapes. Lemons. Melon. Oranges. Peaches. Plums. Pomegranate.  Meats and other protein foods: Beans. Almonds. Sunflower seeds. Pine nuts. Peanuts. [place]. Salmon. Scallops. Shrimp. Winters. Tilapia. Clams. Oysters. Eggs.  Dairy: Low-fat milk. Cheese. Greek yogurt.  Beverages: Water. Red wine. Herbal  tea.  Fats and oils: Extra virgin olive oil. Avocado oil. Grape seed oil.  Sweets and desserts: [place] yogurt with honey. Baked apples. Poached pears. Trail mix.  Seasoning and other foods: Basil. Cilantro. Coriander. Cumin. Mint. Parsley. Sage. Rosemary. Tarragon. Garlic. Oregano. Thyme. Pepper. Balsalmic vinegar. Tahini. Hummus. Tomato sauce. Olives. Mushrooms.  Limit these  Grains: Prepackaged pasta or rice dishes. Prepackaged cereal with added sugar.  Vegetables: Deep fried potatoes (french fries).  Fruits: Fruit canned in syrup.  Meats and other protein foods: Beef. Pork. Lamb. Poultry with skin. Hot dogs. [place] Salines.  Dairy: Ice cream. Sour cream. Whole milk.  Beverages: Juice. Sugar-sweetened soft drinks. Beer. Liquor and spirits.  Fats and oils: Butter. Canola oil. Vegetable oil. Beef fat (tallow). Lard.  Sweets and desserts: Cookies. Cakes. Pies. Candy.  Seasoning and other foods: Mayonnaise. Premade sauces and marinades.  The items listed may not be a complete list. Talk with your dietitian about what dietary choices are right for you. Summary  The Mediterranean diet includes both food and lifestyle choices.  Eat a variety of fresh fruits and vegetables, beans, nuts, seeds, and whole grains.  Limit the amount of red meat and sweets that you eat.  Talk with your health care provider about whether it is safe for you to drink red wine in moderation. This means 1 glass a day for nonpregnant women and 2 glasses a day for men. A glass of wine equals 5 oz (150 mL). This information is not intended to replace advice given to you by your health care provider. Make sure you discuss any questions you have with your health care provider. Document Released: 04/04/2016 Document Revised: 05/07/2016 Document Reviewed: 04/04/2016 Elsevier Interactive Patient Education  2017 [employer].       Electroencephalogram, Adult An electroencephalogram (EEG) is a test that records  electrical activity in the brain. It is often used to diagnose or monitor problems that are related to the brain, such as:  Seizure disorders.  Fainting spells.  Sleep problems.  Head injuries.  Changes in behavior. Tell a health care provider about:  Any allergies you have.  All medicines you are taking, including vitamins, herbs, eye drops, creams, and over-the-counter medicines.  Any medical conditions you have or have had, including psychiatric conditions.  Any surgeries you have had.  Any history of heavy drug or alcohol use. What are the risks? Generally, this is a safe test. If you have a seizure disorder, you may be made to have a seizure during the test. This is done so that your brain activity can be recorded during the seizure. What happens before the procedure?  Arrive with your hair clean, dry, and not styled. ? Do not comb your hair toward your scalp to add volume (backcomb). ? Do not put hair spray, oil, or other hair products in your hair.  Do not  have caffeine within 4 hours of having your test.  Follow instructions from your health care provider about how much sleep to get before the test. If you are asked to limit how much you sleep, you may need to have a responsible adult bring you to the test and take you home from the test.  Ask your health care provider about taking your regular and over-the-counter medicines, herbs, and supplements.   What happens during the procedure? You will be asked to sit in a chair or lie down. Many small metal discs (electrodes) will be carefully attached to your head with an adhesive. It may take some time to get all the electrodes on the right spots for the test. These electrodes will pick up on the signals in your brain, and a machine will record the signals. During the test, you may be asked to:  Sit or lie quietly and relax.  Open and close your eyes.  Breathe deeply and quickly (hyperventilate) for 3 minutes or  longer.  Look at a flashing light for a short amount of time.  Read text or look at an image.  Go to sleep. When the test is complete, the electrodes will be removed by using a solution such as acetone or fingernail polish remover.   What can I expect after the test? After your test, it is common to have no after-effects from the test. You should be able to return to your normal daily activities right away. Keep in mind:  You may need to bathe after the test to remove the adhesive used to attach the electrodes. The adhesive is easily washed away.  If a seizure happened during your EEG, you may need further medical help depending on the type of seizure and how severe it was. It is up to you to get the results of your procedure. Ask your health care provider, or the department that is doing the procedure, when your results will be ready. Summary  An electroencephalogram (EEG) is a test that is often used to diagnose or monitor problems that are related to the brain.  Do not have caffeine within 4 hours of having your test. Follow other instructions from your health care provider about sleep and medicines before the test.  During the procedure, small metal discs (electrodes) will be attached to your head with an adhesive.  You may be asked to sit or lie quietly and relax during the test. You may also be asked to do other activities during the test, such as watch flashing lights or breathe quickly. This information is not intended to replace advice given to you by your health care provider. Make sure you discuss any questions you have with your health care provider. Document Revised: 02/07/2020 Document Reviewed: 01/27/2020 Elsevier Patient Education  [place].

## 2021-01-31 ENCOUNTER — Encounter (HOSPITAL_COMMUNITY): Payer: Self-pay

## 2021-01-31 ENCOUNTER — Telehealth: Payer: Self-pay | Admitting: Neurology

## 2021-01-31 ENCOUNTER — Emergency Department (HOSPITAL_COMMUNITY)
Admission: EM | Admit: 2021-01-31 | Discharge: 2021-01-31 | Disposition: A | Discharge status: Home / Self Care | Payer: Medicare Other | Attending: Emergency Medicine | Admitting: Emergency Medicine

## 2021-01-31 ENCOUNTER — Telehealth: Payer: Self-pay

## 2021-01-31 ENCOUNTER — Emergency Department (HOSPITAL_COMMUNITY): Payer: Medicare Other

## 2021-01-31 ENCOUNTER — Other Ambulatory Visit: Payer: Self-pay

## 2021-01-31 DIAGNOSIS — E161 Other hypoglycemia: Secondary | ICD-10-CM | POA: Diagnosis not present

## 2021-01-31 DIAGNOSIS — R4182 Altered mental status, unspecified: Secondary | ICD-10-CM | POA: Diagnosis not present

## 2021-01-31 DIAGNOSIS — Z79899 Other long term (current) drug therapy: Secondary | ICD-10-CM | POA: Insufficient documentation

## 2021-01-31 DIAGNOSIS — I129 Hypertensive chronic kidney disease with stage 1 through stage 4 chronic kidney disease, or unspecified chronic kidney disease: Secondary | ICD-10-CM | POA: Diagnosis not present

## 2021-01-31 DIAGNOSIS — E162 Hypoglycemia, unspecified: Secondary | ICD-10-CM

## 2021-01-31 DIAGNOSIS — E1122 Type 2 diabetes mellitus with diabetic chronic kidney disease: Secondary | ICD-10-CM | POA: Diagnosis not present

## 2021-01-31 DIAGNOSIS — D631 Anemia in chronic kidney disease: Secondary | ICD-10-CM | POA: Insufficient documentation

## 2021-01-31 DIAGNOSIS — R55 Syncope and collapse: Secondary | ICD-10-CM

## 2021-01-31 DIAGNOSIS — E11649 Type 2 diabetes mellitus with hypoglycemia without coma: Secondary | ICD-10-CM | POA: Insufficient documentation

## 2021-01-31 DIAGNOSIS — N182 Chronic kidney disease, stage 2 (mild): Secondary | ICD-10-CM | POA: Diagnosis not present

## 2021-01-31 DIAGNOSIS — R404 Transient alteration of awareness: Secondary | ICD-10-CM | POA: Diagnosis not present

## 2021-01-31 DIAGNOSIS — Z7984 Long term (current) use of oral hypoglycemic drugs: Secondary | ICD-10-CM | POA: Diagnosis not present

## 2021-01-31 DIAGNOSIS — Z794 Long term (current) use of insulin: Secondary | ICD-10-CM | POA: Insufficient documentation

## 2021-01-31 LAB — CBC
HCT: 36.4 % (ref 36.0–46.0)
Hemoglobin: 11.4 g/dL — ABNORMAL LOW (ref 12.0–15.0)
MCH: 29 pg (ref 26.0–34.0)
MCHC: 31.3 g/dL (ref 30.0–36.0)
MCV: 92.6 fL (ref 80.0–100.0)
Platelets: 140 10*3/uL — ABNORMAL LOW (ref 150–400)
RBC: 3.93 MIL/uL (ref 3.87–5.11)
RDW: 14.8 % (ref 11.5–15.5)
WBC: 6.6 10*3/uL (ref 4.0–10.5)
nRBC: 0 % (ref 0.0–0.2)

## 2021-01-31 LAB — COMPREHENSIVE METABOLIC PANEL
ALT: 14 U/L (ref 0–44)
AST: 26 U/L (ref 15–41)
Albumin: 3.1 g/dL — ABNORMAL LOW (ref 3.5–5.0)
Alkaline Phosphatase: 58 U/L (ref 38–126)
Anion gap: 11 (ref 5–15)
BUN: 21 mg/dL (ref 8–23)
CO2: 29 mmol/L (ref 22–32)
Calcium: 9 mg/dL (ref 8.9–10.3)
Chloride: 103 mmol/L (ref 98–111)
Creatinine, Ser: 1.09 mg/dL — ABNORMAL HIGH (ref 0.44–1.00)
GFR, Estimated: 49 mL/min — ABNORMAL LOW (ref >60)
Glucose, Bld: 158 mg/dL — ABNORMAL HIGH (ref 70–99)
Potassium: 3.1 mmol/L — ABNORMAL LOW (ref 3.5–5.1)
Sodium: 143 mmol/L (ref 135–145)
Total Bilirubin: 0.8 mg/dL (ref 0.3–1.2)
Total Protein: 6.2 g/dL — ABNORMAL LOW (ref 6.5–8.1)

## 2021-01-31 LAB — URINALYSIS, ROUTINE W REFLEX MICROSCOPIC
Bilirubin Urine: NEGATIVE
Glucose, UA: NEGATIVE mg/dL
Hgb urine dipstick: NEGATIVE
Ketones, ur: NEGATIVE mg/dL
Leukocytes,Ua: NEGATIVE
Nitrite: NEGATIVE
Protein, ur: 30 mg/dL — AB
Specific Gravity, Urine: 1.019 (ref 1.005–1.030)
pH: 6 (ref 5.0–8.0)

## 2021-01-31 LAB — CBG MONITORING, ED: Glucose-Capillary: 301 mg/dL — ABNORMAL HIGH (ref 70–99)

## 2021-01-31 MED ORDER — CEPHALEXIN 250 MG PO CAPS
500.0000 mg | ORAL_CAPSULE | Freq: Once | ORAL | Status: AC
  Administered 2021-01-31: 500 mg via ORAL
  Filled 2021-01-31: qty 2

## 2021-01-31 MED ORDER — POTASSIUM CHLORIDE 20 MEQ PO PACK
40.0000 meq | PACK | Freq: Once | ORAL | Status: AC
  Administered 2021-01-31: 40 meq via ORAL
  Filled 2021-01-31: qty 2

## 2021-01-31 MED ORDER — POTASSIUM CHLORIDE CRYS ER 20 MEQ PO TBCR
40.0000 meq | EXTENDED_RELEASE_TABLET | Freq: Once | ORAL | Status: DC
  Filled 2021-01-31: qty 2

## 2021-01-31 MED ORDER — CEPHALEXIN 500 MG PO CAPS: 500.0000 mg | ORAL_CAPSULE | Freq: Three times a day (TID) | ORAL | 0 refills | Status: DC

## 2021-01-31 MED ORDER — POTASSIUM CHLORIDE CRYS ER 20 MEQ PO TBCR: 20.0000 meq | EXTENDED_RELEASE_TABLET | Freq: Every day | ORAL | 0 refills | Status: DC

## 2021-01-31 NOTE — ED Triage Notes (Signed)
Called out by nursing home for hypoglycemia and multiple syncopal episodes over the past month with unknown cause pt is oriented to per place and situation baseline has mild dementia

## 2021-01-31 NOTE — Telephone Encounter (Signed)
Arbie Cookey called in to let Dr.Aquino know [facility] sent Sheela to the hospital. Vickii Chafe wasn't doing well at all. She was hoping Delice Lesch would reach out in hopes she could help.

## 2021-01-31 NOTE — ED Provider Notes (Signed)
[facility] EMERGENCY DEPARTMENT Provider Note   CSN: [id number] Arrival date & time: 01/31/21  1207     History Chief Complaint  Patient presents with  . Hypoglycemia    Kristen Bowman is a 87 y.o. female.  Pt with hx dementia, dm, from ECF via EMS w report of episode altered ms, and recent syncope. Pts glucose was noted to be low, 51 - given D10 w improvement in glucose. Pt limited historian, dementia - level 5 caveat. Pt denies pain or injury. No headache. No neck or back pain. No chest pain or sob. No abd pain or nv. Denies extremity pain. No report of fevers.   The history is provided by the patient and the EMS personnel. The history is limited by the condition of the patient.  Hypoglycemia Associated symptoms: no shortness of breath and no vomiting        Past Medical History:  Diagnosis Date  . Anemia   . Anxiety states   . Diabetes mellitus    T2DM with renal manifestations  . Generalized osteoarthrosis, unspecified site   . Hematuria   . Hyperlipidemia    mixed  . Hypertension   . Irritable bowel syndrome   . Other specified cardiac dysrhythmias(427.89)    atrial fibrilation  . Panic disorder   . Polymyalgia rheumatica ([place])   . Proteinuria 05/29/10  . Type II or unspecified type diabetes mellitus with renal manifestations, uncontrolled(250.42) 05/29/10   CKD II (mild)    Patient Active Problem List   Diagnosis Date Noted  . Dementia ([place]) 10/17/2019  . Pulmonary hypertension ([place]) 06/09/2014  . Tachycardia 09/24/2012  . Palpitations 09/24/2012  . CKD (chronic kidney disease), stage II 09/24/2012  . Anemia, iron deficiency 09/24/2012  . Hypertension   . Panic disorder   . Hyperlipidemia   . Type II or unspecified type diabetes mellitus with renal manifestations, uncontrolled(250.42) 05/29/2010    Past Surgical History:  Procedure Laterality Date  . CATARACT EXTRACTION     right  . COLONOSCOPY N/A 11/04/2012   Procedure:  COLONOSCOPY;  Surgeon: Arta Silence, MD;  Location: [facility];  Service: Endoscopy;  Laterality: N/A;  . ESOPHAGOGASTRODUODENOSCOPY N/A 11/04/2012   Procedure: ESOPHAGOGASTRODUODENOSCOPY (EGD);  Surgeon: Arta Silence, MD;  Location: [facility];  Service: Endoscopy;  Laterality: N/A;  . TUBAL LIGATION       OB History   No obstetric history on file.     Family History  Problem Relation Age of Onset  . Diabetes Sister   . Cancer Other   . Heart attack Other   . Emphysema Mother   . Leukemia Father     Social History   Tobacco Use  . Smoking status: Never Smoker  . Smokeless tobacco: Never Used  Vaping Use  . Vaping Use: Never used  Substance Use Topics  . Alcohol use: No  . Drug use: No    Home Medications Prior to Admission medications   Medication Sig Start Date End Date Taking? Authorizing Provider  acetaminophen (TYLENOL) 325 MG tablet Take 650 mg by mouth every 4 (four) hours as needed for moderate pain.    [provider]  amLODipine (NORVASC) 2.5 MG tablet Take 2.5 mg by mouth daily. 06/02/20   [provider]  atorvastatin (LIPITOR) 40 MG tablet Take 40 mg by mouth daily.    [provider]  clonazePAM (KLONOPIN) 0.5 MG tablet Take 0.25 mg by mouth at bedtime as needed for anxiety. 05/10/20  [provider]  clonazePAM (KLONOPIN) 0.5 MG tablet Take 0.25 mg by mouth in the morning.    [provider]  cyanocobalamin (,VITAMIN B-12,) 1000 MCG/ML injection Inject 1,000 mcg into the skin every Friday.    [provider]  divalproex (DEPAKOTE) 250 MG DR tablet Take 1 tablet every night Patient taking differently: Take 250 mg by mouth 2 (two) times daily. 10/28/19   Cameron Sprang, MD  donepezil (ARICEPT) 5 MG tablet Take 5 mg by mouth at bedtime. 12/01/19   [provider]  insulin lispro (HUMALOG) 100 UNIT/ML KwikPen Inject 7 Units into the skin 3 (three) times daily before meals. 07/22/20   [provider]  Insulin NPH, Human,, Isophane, (HUMULIN N KWIKPEN) 100 UNIT/ML Kiwkpen Inject 8-15 Units into the skin See admin instructions. Inject 15 units subcutaneously in the morning and inject 8 units subcutaneously at bedtime.    [provider]  losartan (COZAAR) 100 MG tablet Take 100 mg by mouth daily.  08/08/16   [provider]  metFORMIN (GLUCOPHAGE-XR) 500 MG 24 hr tablet Take 500 mg by mouth daily.  08/14/16   [provider]  metoprolol tartrate (LOPRESSOR) 50 MG tablet Take 50 mg by mouth 2 (two) times daily. 08/24/20   [provider]  risperiDONE (RISPERDAL) 1 MG tablet Take 1 mg by mouth at bedtime. 08/24/20   [provider]    Allergies    Ace inhibitors and Tape  Review of Systems   Review of Systems  Constitutional: Negative for fever.  HENT: Negative for sore throat.   Eyes: Negative for visual disturbance.  Respiratory: Negative for cough and shortness of breath.   Cardiovascular: Negative for chest pain.  Gastrointestinal: Negative for abdominal pain, diarrhea and vomiting.  Genitourinary: Negative for dysuria and flank pain.  Musculoskeletal: Negative for back pain and neck pain.  Skin: Negative for rash.  Neurological: Negative for headaches.  Hematological: Does not bruise/bleed easily.  Psychiatric/Behavioral: Positive for confusion.    Physical Exam Updated Vital Signs BP (!) 153/62 (BP Location: Right Arm)   Pulse 80   Temp 98.2 F (36.8 C) (Oral)   Resp 14   Ht 1.676 m (5\' 6" )   Wt 72.1 kg   SpO2 99%   BMI 25.66 kg/m   Physical Exam Vitals and nursing note reviewed.  Constitutional:      Appearance: Normal appearance. She is well-developed.  HENT:     Head: Atraumatic.     Nose: Nose normal.     Mouth/Throat:     Mouth: Mucous membranes are moist.  Eyes:     General: No scleral icterus.    Conjunctiva/sclera: Conjunctivae normal.     Pupils: Pupils are equal, round, and reactive to  light.  Neck:     Vascular: No carotid bruit.     Trachea: No tracheal deviation.     Comments: No stiffness or rigidity.  Cardiovascular:     Rate and Rhythm: Normal rate and regular rhythm.     Pulses: Normal pulses.     Heart sounds: Normal heart sounds. No murmur heard. No friction rub. No gallop.   Pulmonary:     Effort: Pulmonary effort is normal. No respiratory distress.     Breath sounds: Normal breath sounds.  Abdominal:     General: Bowel sounds are normal. There is no distension.     Palpations: Abdomen is soft.     Tenderness: There is no abdominal tenderness. There is no  guarding.  Genitourinary:    Comments: No cva tenderness.  Musculoskeletal:        General: No swelling or tenderness.     Cervical back: Normal range of motion and neck supple. No rigidity. No muscular tenderness.     Right lower leg: No edema.     Left lower leg: No edema.  Skin:    General: Skin is warm and dry.     Findings: No rash.  Neurological:     Mental Status: She is alert.     Comments: Alert, speech normal, fluent. Motor intact bil, stre 5/5. Sens grossly intact bil.   Psychiatric:        Mood and Affect: Mood normal.     ED Results / Procedures / Treatments   Labs (all labs ordered are listed, but only abnormal results are displayed) Results for orders placed or performed during the hospital encounter of 01/31/21  Comprehensive metabolic panel  Result Value Ref Range   Sodium 143 135 - 145 mmol/L   Potassium 3.1 (L) 3.5 - 5.1 mmol/L   Chloride 103 98 - 111 mmol/L   CO2 29 22 - 32 mmol/L   Glucose, Bld 158 (H) 70 - 99 mg/dL   BUN 21 8 - 23 mg/dL   Creatinine, Ser 1.09 (H) 0.44 - 1.00 mg/dL   Calcium 9.0 8.9 - 10.3 mg/dL   Total Protein 6.2 (L) 6.5 - 8.1 g/dL   Albumin 3.1 (L) 3.5 - 5.0 g/dL   AST 26 15 - 41 U/L   ALT 14 0 - 44 U/L   Alkaline Phosphatase 58 38 - 126 U/L   Total Bilirubin 0.8 0.3 - 1.2 mg/dL   GFR, Estimated 49 (L) >60 mL/min   Anion gap 11 5 - 15  CBC   Result Value Ref Range   WBC 6.6 4.0 - 10.5 K/uL   RBC 3.93 3.87 - 5.11 MIL/uL   Hemoglobin 11.4 (L) 12.0 - 15.0 g/dL   HCT 36.4 36.0 - 46.0 %   MCV 92.6 80.0 - 100.0 fL   MCH 29.0 26.0 - 34.0 pg   MCHC 31.3 30.0 - 36.0 g/dL   RDW 14.8 11.5 - 15.5 %   Platelets 140 (L) 150 - 400 K/uL   nRBC 0.0 0.0 - 0.2 %  Urinalysis, Routine w reflex microscopic  Result Value Ref Range   Color, Urine AMBER (A) YELLOW   APPearance HAZY (A) CLEAR   Specific Gravity, Urine 1.019 1.005 - 1.030   pH 6.0 5.0 - 8.0   Glucose, UA NEGATIVE NEGATIVE mg/dL   Hgb urine dipstick NEGATIVE NEGATIVE   Bilirubin Urine NEGATIVE NEGATIVE   Ketones, ur NEGATIVE NEGATIVE mg/dL   Protein, ur 30 (A) NEGATIVE mg/dL   Nitrite NEGATIVE NEGATIVE   Leukocytes,Ua NEGATIVE NEGATIVE   RBC / HPF 0-5 0 - 5 RBC/hpf   WBC, UA 0-5 0 - 5 WBC/hpf   Bacteria, UA MANY (A) NONE SEEN   Squamous Epithelial / LPF 0-5 0 - 5   Amorphous Crystal PRESENT     EKG EKG Interpretation  Date/Time:  Wednesday January 31 2021 12:16:57 EDT Ventricular Rate:  77 PR Interval:  183 QRS Duration: 92 QT Interval:  404 QTC Calculation: 458 R Axis:   87 Text Interpretation: Sinus rhythm Nonspecific T wave abnormality Confirmed by Lajean Saver [phone]) on 01/31/2021 12:27:52 PM   Radiology CT Head Wo Contrast  Result Date: 01/31/2021 CLINICAL DATA:  Mental status change of unknown cause in a  87 year old female. EXAM: CT HEAD WITHOUT CONTRAST TECHNIQUE: Contiguous axial images were obtained from the base of the skull through the vertex without intravenous contrast. COMPARISON:  Jan 02, 2021. FINDINGS: Brain: No evidence of acute infarction, hemorrhage, hydrocephalus, extra-axial collection or mass lesion/mass effect. Signs of atrophy and chronic microvascular ischemic change as before. Vascular: No hyperdense vessel or unexpected calcification. Skull: Normal. Negative for fracture or focal lesion. Sinuses/Orbits: Visualized paranasal sinuses and orbits  are unremarkable. Other: None IMPRESSION: 1. No acute intracranial pathology. 2. Signs of atrophy and chronic microvascular ischemic change as before. Electronically Signed   By: Zetta Bills M.D.   On: 01/31/2021 14:41    Procedures Procedures   Medications Ordered in ED Medications - No data to display  ED Course  I have reviewed the triage vital signs and the nursing notes.  Pertinent labs & imaging results that were available during my care of the patient were reviewed by me and considered in my medical decision making (see chart for details).    MDM Rules/Calculators/A&P                         Iv ns. Continuous pulse ox and cardiac monitoring. Stat labs and imaging.   Reviewed nursing notes and prior charts for additional history.   Labs reviewed/interpreted by me - hgb normal.   CT reviewed/interpreted by me - no hem.  Additional labs reviewed/interpreted by me - on cath ua, urine cloudy, many bacteria - will tx.   Patient has eaten/drank. No distress or discomfort. Abd soft nt. Glucose improved. Abd soft nt. nsr on monitor. Pt currently appears stable for d/c.   rec pcp f/u.   Return precautions provided.      Final Clinical Impression(s) / ED Diagnoses Final diagnoses:  None    Rx / DC Orders ED Discharge Orders    None       Lajean Saver, MD 01/31/21 1627

## 2021-01-31 NOTE — Discharge Instructions (Addendum)
It was our pleasure to provide your ER care today - we hope that you feel better.  If you begin to feel blood sugar is low - eat or drink something immediately, and check your glucose level.  From today's labs, your potassium level is low (3.1) - eat plenty of fruits and vegetables, take potassium supplement as prescribed, and follow up with primary care doctor.  The labs also showed a possible urine infection - take antibiotic as prescribed.   Stay well hydrated/drink plenty of fluids.   Fall precautions.  Follow up with primary care doctor in 1-2 weeks.   Return to ER if worse, new symptoms, fevers, trouble breathing, new/severe pain, chest pain, or other concern.

## 2021-01-31 NOTE — Telephone Encounter (Signed)
Facility called stated that pt was going to the hospital via ems she had an episode of unresponsiveness leaning to one side and sweating,

## 2021-01-31 NOTE — ED Notes (Signed)
I have attempted to call report to facility x 3 with no answer allowing the phone to ring all 3 times until it hung up and d/c me

## 2021-02-02 NOTE — Telephone Encounter (Signed)
Patient has already been discharged from ER, was noted to be hypoglycemic. F/u with PCP on glucose control. Proceed with EEG as scheduled.

## 2021-02-02 NOTE — Telephone Encounter (Signed)
See other phone note

## 2021-02-07 ENCOUNTER — Other Ambulatory Visit: Payer: Self-pay

## 2021-02-07 ENCOUNTER — Ambulatory Visit: Payer: Medicare Other | Admitting: Neurology

## 2021-02-07 DIAGNOSIS — R569 Unspecified convulsions: Secondary | ICD-10-CM

## 2021-02-07 NOTE — Procedures (Signed)
ELECTROENCEPHALOGRAM REPORT  Date of Study: 02/07/2021  Patient's Name: Kristen Bowman MRN: [id number] Date of Birth: [birth year]  Referring Provider: Dr. Ellouise Newer  Clinical History: This is an 87 year old woman with dementia with episodes of loss of consciousness. EEG for classification.  Medications: Depakote Clonazepam Donepezil Risperdal Metoprolol Metformin Cozaar Lipitor Norvasc  Technical Summary: A multichannel digital EEG recording measured by the international 10-20 system with electrodes applied with paste and impedances below 5000 ohms performed as portable with EKG monitoring in an awake and asleep patient.  Hyperventilation was not performed. Photic stimulation was performed.  The digital EEG was referentially recorded, reformatted, and digitally filtered in a variety of bipolar and referential montages for optimal display.   Description: The patient is awake and asleep during the recording.  During maximal wakefulness, there is a symmetric, medium voltage 7-7.5 Hz posterior dominant rhythm that attenuates with eye opening. This is admixed with a moderate amount of diffuse 4-5 Hz theta and occasional 2-3 Hz delta slowing of the waking background.  During drowsiness and sleep, there is an increase in theta and delta slowing of the background with poorly formed vertex waves seen. Photic stimulation did not elicit any abnormalities.  There were no epileptiform discharges or electrographic seizures seen.    EKG lead was unremarkable.  Impression: This awake and asleep EEG is abnormal due to mild to moderate diffuse slowing of the waking background.  Clinical Correlation of the above findings indicates diffuse cerebral dysfunction that is non-specific in etiology and can be seen with hypoxic/ischemic injury, toxic/metabolic encephalopathies, neurodegenerative disorders, or medication effect.  The absence of epileptiform discharges does not rule out a clinical diagnosis of  epilepsy.  Clinical correlation is advised.   Ellouise Newer, M.D.

## 2021-02-12 ENCOUNTER — Emergency Department (HOSPITAL_COMMUNITY)
Admission: EM | Admit: 2021-02-12 | Discharge: 2021-02-12 | Disposition: A | Discharge status: Home / Self Care | Payer: Medicare Other | Attending: Emergency Medicine | Admitting: Emergency Medicine

## 2021-02-12 ENCOUNTER — Telehealth: Payer: Self-pay | Admitting: Neurology

## 2021-02-12 ENCOUNTER — Encounter (HOSPITAL_COMMUNITY): Payer: Self-pay

## 2021-02-12 ENCOUNTER — Emergency Department (HOSPITAL_COMMUNITY): Payer: Medicare Other

## 2021-02-12 ENCOUNTER — Other Ambulatory Visit: Payer: Self-pay

## 2021-02-12 DIAGNOSIS — Z79899 Other long term (current) drug therapy: Secondary | ICD-10-CM | POA: Insufficient documentation

## 2021-02-12 DIAGNOSIS — R0902 Hypoxemia: Secondary | ICD-10-CM | POA: Diagnosis not present

## 2021-02-12 DIAGNOSIS — I129 Hypertensive chronic kidney disease with stage 1 through stage 4 chronic kidney disease, or unspecified chronic kidney disease: Secondary | ICD-10-CM | POA: Diagnosis not present

## 2021-02-12 DIAGNOSIS — E1122 Type 2 diabetes mellitus with diabetic chronic kidney disease: Secondary | ICD-10-CM | POA: Diagnosis not present

## 2021-02-12 DIAGNOSIS — R4182 Altered mental status, unspecified: Secondary | ICD-10-CM | POA: Insufficient documentation

## 2021-02-12 DIAGNOSIS — R404 Transient alteration of awareness: Secondary | ICD-10-CM | POA: Diagnosis not present

## 2021-02-12 DIAGNOSIS — F0390 Unspecified dementia without behavioral disturbance: Secondary | ICD-10-CM | POA: Insufficient documentation

## 2021-02-12 DIAGNOSIS — R4020 Unspecified coma: Secondary | ICD-10-CM | POA: Diagnosis not present

## 2021-02-12 DIAGNOSIS — N3000 Acute cystitis without hematuria: Secondary | ICD-10-CM | POA: Insufficient documentation

## 2021-02-12 DIAGNOSIS — R531 Weakness: Secondary | ICD-10-CM

## 2021-02-12 DIAGNOSIS — N182 Chronic kidney disease, stage 2 (mild): Secondary | ICD-10-CM | POA: Diagnosis not present

## 2021-02-12 DIAGNOSIS — Z794 Long term (current) use of insulin: Secondary | ICD-10-CM | POA: Diagnosis not present

## 2021-02-12 DIAGNOSIS — I10 Essential (primary) hypertension: Secondary | ICD-10-CM | POA: Diagnosis not present

## 2021-02-12 DIAGNOSIS — Z7984 Long term (current) use of oral hypoglycemic drugs: Secondary | ICD-10-CM | POA: Insufficient documentation

## 2021-02-12 DIAGNOSIS — Z515 Encounter for palliative care: Secondary | ICD-10-CM

## 2021-02-12 DIAGNOSIS — Z743 Need for continuous supervision: Secondary | ICD-10-CM | POA: Diagnosis not present

## 2021-02-12 LAB — COMPREHENSIVE METABOLIC PANEL
ALT: 11 U/L (ref 0–44)
AST: 16 U/L (ref 15–41)
Albumin: 2.8 g/dL — ABNORMAL LOW (ref 3.5–5.0)
Alkaline Phosphatase: 56 U/L (ref 38–126)
Anion gap: 6 (ref 5–15)
BUN: 20 mg/dL (ref 8–23)
CO2: 27 mmol/L (ref 22–32)
Calcium: 9 mg/dL (ref 8.9–10.3)
Chloride: 107 mmol/L (ref 98–111)
Creatinine, Ser: 1.05 mg/dL — ABNORMAL HIGH (ref 0.44–1.00)
GFR, Estimated: 51 mL/min — ABNORMAL LOW (ref >60)
Glucose, Bld: 72 mg/dL (ref 70–99)
Potassium: 3.4 mmol/L — ABNORMAL LOW (ref 3.5–5.1)
Sodium: 140 mmol/L (ref 135–145)
Total Bilirubin: 0.9 mg/dL (ref 0.3–1.2)
Total Protein: 6.2 g/dL — ABNORMAL LOW (ref 6.5–8.1)

## 2021-02-12 LAB — URINALYSIS, ROUTINE W REFLEX MICROSCOPIC
Bilirubin Urine: NEGATIVE
Glucose, UA: NEGATIVE mg/dL
Hgb urine dipstick: NEGATIVE
Ketones, ur: 5 mg/dL — AB
Leukocytes,Ua: NEGATIVE
Nitrite: POSITIVE — AB
Protein, ur: NEGATIVE mg/dL
Specific Gravity, Urine: 1.02 (ref 1.005–1.030)
pH: 6 (ref 5.0–8.0)

## 2021-02-12 LAB — CBC WITH DIFFERENTIAL/PLATELET
Abs Immature Granulocytes: 0.03 10*3/uL (ref 0.00–0.07)
Basophils Absolute: 0 10*3/uL (ref 0.0–0.1)
Basophils Relative: 0 %
Eosinophils Absolute: 0.1 10*3/uL (ref 0.0–0.5)
Eosinophils Relative: 2 %
HCT: 36 % (ref 36.0–46.0)
Hemoglobin: 11.6 g/dL — ABNORMAL LOW (ref 12.0–15.0)
Immature Granulocytes: 0 %
Lymphocytes Relative: 15 %
Lymphs Abs: 1.3 10*3/uL (ref 0.7–4.0)
MCH: 29.4 pg (ref 26.0–34.0)
MCHC: 32.2 g/dL (ref 30.0–36.0)
MCV: 91.4 fL (ref 80.0–100.0)
Monocytes Absolute: 0.5 10*3/uL (ref 0.1–1.0)
Monocytes Relative: 6 %
Neutro Abs: 7.1 10*3/uL (ref 1.7–7.7)
Neutrophils Relative %: 77 %
Platelets: 151 10*3/uL (ref 150–400)
RBC: 3.94 MIL/uL (ref 3.87–5.11)
RDW: 14.5 % (ref 11.5–15.5)
WBC: 9.2 10*3/uL (ref 4.0–10.5)
nRBC: 0 % (ref 0.0–0.2)

## 2021-02-12 LAB — VALPROIC ACID LEVEL: Valproic Acid Lvl: 53 ug/mL (ref 50.0–100.0)

## 2021-02-12 MED ORDER — NITROFURANTOIN MONOHYD MACRO 100 MG PO CAPS: 100.0000 mg | ORAL_CAPSULE | Freq: Two times a day (BID) | ORAL | 0 refills | Status: DC

## 2021-02-12 NOTE — Telephone Encounter (Signed)
Left message to call office back

## 2021-02-12 NOTE — Telephone Encounter (Signed)
Does she have a cardiologist? If not, pls let son know we can refer to Cardiology for evaluation of recurrent syncope, thanks

## 2021-02-12 NOTE — Telephone Encounter (Signed)
[name] called in and wanted to let aquino know Jakaylah has been sent to ER again. She was unresponsive.

## 2021-02-12 NOTE — ED Provider Notes (Signed)
[facility] EMERGENCY DEPARTMENT Provider Note  CSN: [id number] Arrival date & time: 02/12/21 1010    History Chief Complaint  Patient presents with   Fatigue    HPI  Kristen Bowman is a 87 y.o. female with history of dementia, DM, CKD and prior syncope  brought to the ED via EMS from SNF for evaluation of AMS. She was reportedly unresponsive, fire dept found her to be hypoxic but EMS reports her SpO2 improved as her mental status improved. EMS reports she was initially only responsive to painful stimuli, but now awake and alert and back to baseline. She was seen in the ED for same on 6/8, had mild hypoglycemia then. None today. No witnessed seizure activity, but has been evaluated for same by Neurology; had EEG done 6/15 which was neg for epileptiform activity, just showed diffuse slowing. She is on Depakote for dementia. Last seen well was last night. Patient states she feels fine, otherwise unable to give additional history.    Past Medical History:  Diagnosis Date   Anemia    Anxiety states    Diabetes mellitus    T2DM with renal manifestations   Generalized osteoarthrosis, unspecified site    Hematuria    Hyperlipidemia    mixed   Hypertension    Irritable bowel syndrome    Other specified cardiac dysrhythmias(427.89)    atrial fibrilation   Panic disorder    Polymyalgia rheumatica ([facility])    Proteinuria 05/29/10   Type II or unspecified type diabetes mellitus with renal manifestations, uncontrolled(250.42) 05/29/10   CKD II (mild)    Past Surgical History:  Procedure Laterality Date   CATARACT EXTRACTION     right   COLONOSCOPY N/A 11/04/2012   Procedure: COLONOSCOPY;  Surgeon: Arta Silence, MD;  Location: [facility];  Service: Endoscopy;  Laterality: N/A;   ESOPHAGOGASTRODUODENOSCOPY N/A 11/04/2012   Procedure: ESOPHAGOGASTRODUODENOSCOPY (EGD);  Surgeon: Arta Silence, MD;  Location: [facility];  Service: Endoscopy;  Laterality: N/A;   TUBAL LIGATION       Family History  Problem Relation Age of Onset   Diabetes Sister    Cancer Other    Heart attack Other    Emphysema Mother    Leukemia Father     Social History   Tobacco Use   Smoking status: Never   Smokeless tobacco: Never  Vaping Use   Vaping Use: Never used  Substance Use Topics   Alcohol use: No   Drug use: No     Home Medications Prior to Admission medications   Medication Sig Start Date End Date Taking? Authorizing Provider  nitrofurantoin, macrocrystal-monohydrate, (MACROBID) 100 MG capsule Take 1 capsule (100 mg total) by mouth 2 (two) times daily. 02/12/21  Yes Truddie Hidden, MD  acetaminophen (TYLENOL) 325 MG tablet Take 650 mg by mouth every 4 (four) hours as needed for moderate pain.    [provider]  amLODipine (NORVASC) 2.5 MG tablet Take 2.5 mg by mouth daily. 06/02/20   [provider]  atorvastatin (LIPITOR) 40 MG tablet Take 40 mg by mouth daily.    [provider]  clonazePAM (KLONOPIN) 0.5 MG tablet Take 0.25 mg by mouth at bedtime as needed for anxiety. 05/10/20   [provider]  clonazePAM (KLONOPIN) 0.5 MG tablet Take 0.25 mg by mouth in the morning.    [provider]  cyanocobalamin (,VITAMIN B-12,) 1000 MCG/ML injection Inject 1,000 mcg into the skin every Friday.    [provider]  divalproex (DEPAKOTE) 250 MG DR tablet Take 1 tablet every night Patient taking differently: Take 250 mg by mouth 2 (two) times daily. 10/28/19   Cameron Sprang, MD  donepezil (ARICEPT) 5 MG tablet Take 5 mg by mouth at bedtime. 12/01/19   [provider]  insulin lispro (HUMALOG) 100 UNIT/ML KwikPen Inject 7 Units into the skin 3 (three) times daily before meals. 07/22/20   [provider]  Insulin NPH, Human,, Isophane, (HUMULIN N KWIKPEN) 100 UNIT/ML Kiwkpen Inject 8-15 Units into the skin See admin instructions. Inject 15 units subcutaneously in the morning and inject 8 units subcutaneously  at bedtime.    [provider]  losartan (COZAAR) 100 MG tablet Take 100 mg by mouth daily.  08/08/16   [provider]  metFORMIN (GLUCOPHAGE-XR) 500 MG 24 hr tablet Take 500 mg by mouth daily.  08/14/16   [provider]  metoprolol tartrate (LOPRESSOR) 50 MG tablet Take 50 mg by mouth 2 (two) times daily. 08/24/20   [provider]  potassium chloride SA (KLOR-CON) 20 MEQ tablet Take 1 tablet (20 mEq total) by mouth daily. 01/31/21   Lajean Saver, MD  risperiDONE (RISPERDAL) 1 MG tablet Take 1 mg by mouth at bedtime. 08/24/20   [provider]     Allergies    Ace inhibitors and Tape   Review of Systems   Review of Systems Unable to assess due to mental status.    Physical Exam BP (!) 161/67   Pulse 81   Temp 97.6 F (36.4 C) (Oral)   Resp 20   Ht 5\' 6"  (1.676 m)   Wt 72.1 kg   SpO2 98%   BMI 25.66 kg/m   Physical Exam Vitals and nursing note reviewed.  Constitutional:      Appearance: Normal appearance.  HENT:     Head: Normocephalic and atraumatic.     Nose: Nose normal.     Mouth/Throat:     Mouth: Mucous membranes are moist.  Eyes:     Extraocular Movements: Extraocular movements intact.     Conjunctiva/sclera: Conjunctivae normal.  Cardiovascular:     Rate and Rhythm: Normal rate.  Pulmonary:     Effort: Pulmonary effort is normal.     Breath sounds: Normal breath sounds.  Abdominal:     General: Abdomen is flat.     Palpations: Abdomen is soft.     Tenderness: There is no abdominal tenderness.  Musculoskeletal:        General: No swelling. Normal range of motion.     Cervical back: Neck supple.  Skin:    General: Skin is warm and dry.  Neurological:     General: No focal deficit present.     Mental Status: She is alert. Mental status is at baseline. She is disoriented.     Cranial Nerves: No cranial nerve deficit.     Sensory: No sensory deficit.     Motor: No weakness.  Psychiatric:        Mood and  Affect: Mood normal.     ED Results / Procedures / Treatments   Labs (all labs ordered are listed, but only abnormal results are displayed) Labs Reviewed  COMPREHENSIVE METABOLIC PANEL - Abnormal; Notable for the following components:      Result Value   Potassium 3.4 (*)    Creatinine, Ser 1.05 (*)    Total Protein 6.2 (*)    Albumin 2.8 (*)    GFR, Estimated 51 (*)  All other components within normal limits  CBC WITH DIFFERENTIAL/PLATELET - Abnormal; Notable for the following components:   Hemoglobin 11.6 (*)    All other components within normal limits  URINALYSIS, ROUTINE W REFLEX MICROSCOPIC - Abnormal; Notable for the following components:   Color, Urine AMBER (*)    APPearance HAZY (*)    Ketones, ur 5 (*)    Nitrite POSITIVE (*)    Bacteria, UA MANY (*)    All other components within normal limits  URINE CULTURE  VALPROIC ACID LEVEL    EKG EKG Interpretation  Date/Time:  Monday February 12 2021 10:12:55 EDT Ventricular Rate:  64 PR Interval:  219 QRS Duration: 98 QT Interval:  388 QTC Calculation: 401 R Axis:   76 Text Interpretation: Sinus rhythm Borderline prolonged PR interval Probable left atrial enlargement Borderline T wave abnormalities Artifact in lead(s) I III aVL aVF No significant change since last tracing Confirmed by Calvert Cantor [phone]) on 02/12/2021 10:22:43 AM  Radiology CT Head Wo Contrast  Result Date: 02/12/2021 CLINICAL DATA:  87 year old female with altered mental status. EXAM: CT HEAD WITHOUT CONTRAST TECHNIQUE: Contiguous axial images were obtained from the base of the skull through the vertex without intravenous contrast. COMPARISON:  01/31/2021 CT and prior studies FINDINGS: Brain: No evidence of acute infarction, hemorrhage, hydrocephalus, extra-axial collection or mass lesion/mass effect. Atrophy and chronic small-vessel white matter ischemic changes are again identified. Vascular: Carotid and vertebral atherosclerotic calcifications  are present. Skull: Normal. Negative for fracture or focal lesion. Sinuses/Orbits: No acute finding. Other: None. IMPRESSION: 1. No evidence of acute intracranial abnormality. 2. Atrophy and chronic small-vessel white matter ischemic changes. Electronically Signed   By: Margarette Canada M.D.   On: 02/12/2021 11:00    Procedures Procedures  Medications Ordered in the ED Medications - No data to display   MDM Rules/Calculators/A&P MDM  Patient with an episode of unresponsiveness, history of same. Back to baseline now. Will check labs, including depakote level, CT head and EKG.   ED Course  I have reviewed the triage vital signs and the nursing notes.  Pertinent labs & imaging results that were available during my care of the patient were reviewed by me and considered in my medical decision making (see chart for details).  Clinical Course as of 02/12/21 1508  Mon Feb 12, 2021  1109 CT without acute change.  [CS]  0175 CBC is at baseline.  [CS]  1025 CMP is unremarkable.  [CS]  8527 Depakote is therapeutic. [CS]  7824 UA with nitrates, WBC and many bacteria. This is changed from previous even though she was given Rx for Keflex at previous visit. Will send for culture, begin Macrobid and return to SNF. Family at bedside amenable to this plan.  [CS]    Clinical Course User Index [CS] Truddie Hidden, MD    Final Clinical Impression(s) / ED Diagnoses Final diagnoses:  Weakness  Transient alteration of awareness  Acute cystitis without hematuria    Rx / DC Orders ED Discharge Orders          Ordered    nitrofurantoin, macrocrystal-monohydrate, (MACROBID) 100 MG capsule  2 times daily        02/12/21 1508             Truddie Hidden, MD 02/12/21 [phone]

## 2021-02-12 NOTE — Telephone Encounter (Signed)
Son called back, he missed call.

## 2021-02-12 NOTE — Telephone Encounter (Signed)
Patient is still in the hospital, will follow up as scheduled.

## 2021-02-12 NOTE — ED Notes (Signed)
Family updated as to patient's status.

## 2021-02-12 NOTE — ED Triage Notes (Signed)
BIB EMS from [facility]. Staff called and said that they had a low SPO2 reading and patient was not responding. Upon EMS arrival they were able to wake her and had an SPO2 of 100%. Aox4 at this time

## 2021-02-14 DIAGNOSIS — E1165 Type 2 diabetes mellitus with hyperglycemia: Secondary | ICD-10-CM | POA: Diagnosis not present

## 2021-02-14 LAB — URINE CULTURE: Culture: >=100000 — AB

## 2021-02-15 ENCOUNTER — Telehealth: Payer: Self-pay | Admitting: *Deleted

## 2021-02-15 NOTE — Telephone Encounter (Signed)
Benjamine Mola would like a call back regarding [place]. [phone]

## 2021-02-15 NOTE — Telephone Encounter (Signed)
Post ED Visit - Positive Culture Follow-up: Unsuccessful Patient Follow-up  Culture assessed and recommendations reviewed by:  []  Elenor Quinones, Pharm.D. []  Heide Guile, Pharm.D., BCPS AQ-ID []  Parks Neptune, Pharm.D., BCPS []  Alycia Rossetti, Pharm.D., BCPS []  [place], [place].D., BCPS, AAHIVP []  Legrand Como, Pharm.D., BCPS, AAHIVP []  Wynell Balloon, PharmD []  Vincenza Hews, PharmD, BCPS  Positive urine culture  []  Patient discharged without antimicrobial prescription and treatment is now indicated [x]  Organism is resistant to prescribed ED discharge antimicrobial []  Patient with positive blood cultures  Plan:  If still symptomatic, stop Nitrofurantoin Monohyd Macro and start Cephalexin 500mg  q12 hours x 5 days.  Quintella Reichert, MD  Unable to contact patient after 3 attempts, letter will be sent to address on file  Ardeen Fillers 02/15/2021, 12:22 PM

## 2021-02-16 ENCOUNTER — Other Ambulatory Visit: Payer: Self-pay

## 2021-02-16 DIAGNOSIS — R55 Syncope and collapse: Secondary | ICD-10-CM

## 2021-02-16 NOTE — Telephone Encounter (Signed)
Son, gary called heather back regarding Maryn. Would like a callback [phone]

## 2021-02-16 NOTE — Telephone Encounter (Signed)
Elizabeth from [employer] called again about this patient. She wants Dr. Delice Lesch to be aware the patient continues to have seizures and seems to be worsening after each one.

## 2021-02-16 NOTE — Telephone Encounter (Signed)
Pt son called no Benjamine Mola seen on pt DPR. Cardiology referral placed in Epic for pt for syncope

## 2021-02-16 NOTE — Telephone Encounter (Signed)
Kristen Bowman called back no answer left a voice mail to call the office back when she calls back we need to let her know that EEG showed slowing with dementia but no seizures. In regards to risperidone we will reducing it from 1mg  to 0.5 mg  to see if that helps with the blacking out spells.  We are also placing a hospice order for pt,

## 2021-02-20 NOTE — Telephone Encounter (Signed)
Kristen Bowman called back she was in a meeting will try and call her again later

## 2021-02-22 NOTE — Telephone Encounter (Signed)
Called Benjamine Mola back today she was off LVM to call the office back

## 2021-02-28 MED ORDER — RISPERIDONE 1 MG PO TABS: 0.5000 mg | ORAL_TABLET | Freq: Every day | ORAL | 1 refills | Status: DC

## 2021-02-28 NOTE — Addendum Note (Signed)
Addended by: Jake Seats on: 02/28/2021 11:37 AM   Modules accepted: Orders

## 2021-02-28 NOTE — Telephone Encounter (Signed)
Benjamine Mola called at phone number [phone] for mrs Kristen Bowman call went to voice mail, message left to call office back Ive called many time if no call back will close out this phone note, I have talked to pt son and all orders have been placed and referrals made,

## 2021-03-01 ENCOUNTER — Telehealth: Payer: Self-pay

## 2021-03-01 NOTE — Telephone Encounter (Signed)
Spoke with Benjamine Mola will fax Risperdal order to her to [phone], hospice is on taken care of her.

## 2021-03-16 DIAGNOSIS — E1165 Type 2 diabetes mellitus with hyperglycemia: Secondary | ICD-10-CM | POA: Diagnosis not present

## 2021-03-27 IMAGING — CT CT HEAD W/O CM
3 series · 15 of 47 positions shown, 18 images · non-contrast
Comparison: 10/17/2019

CLINICAL DATA: Altered mental status

EXAM:
CT HEAD WITHOUT CONTRAST
TECHNIQUE: Contiguous axial images were obtained from the base of the skull
through the vertex without intravenous contrast.

[Series 2: head 5.0 h30s · axial · 0.44mm/px · z∈[-130,+15]mm · 9 of 35 slices shown, 12 images]
[im 3/35  brain]
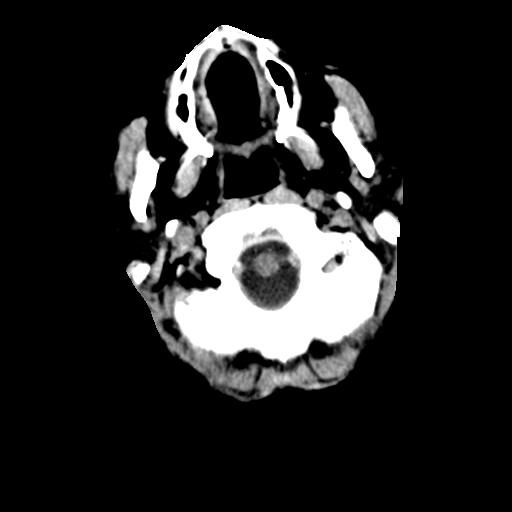
[im 3/35  bone]
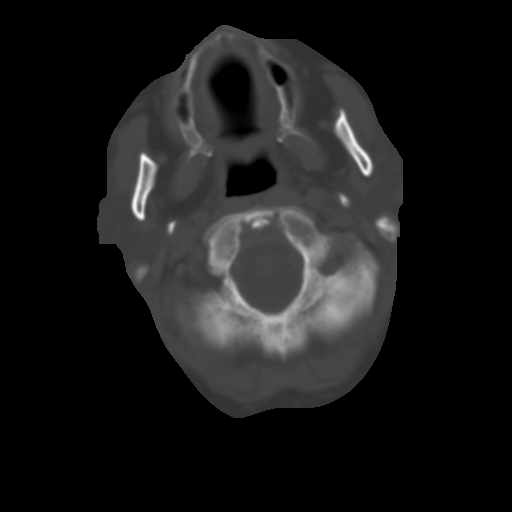
[im 6/35  brain]
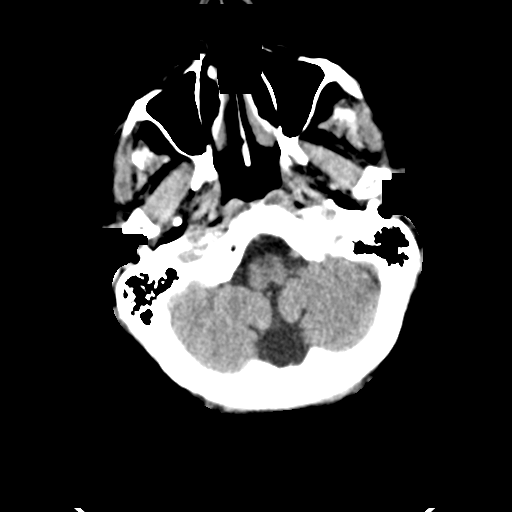
[im 10/35  brain]
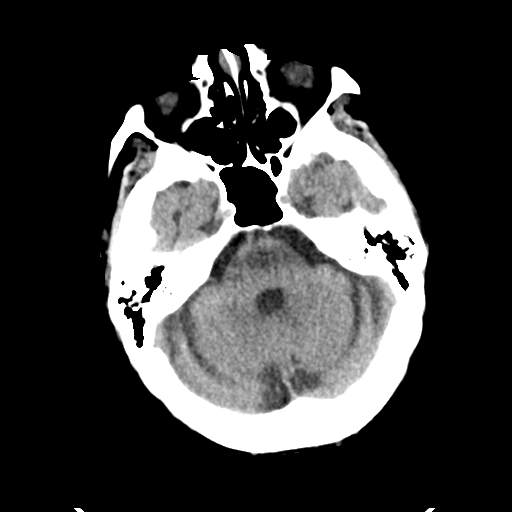
[im 13/35  brain]
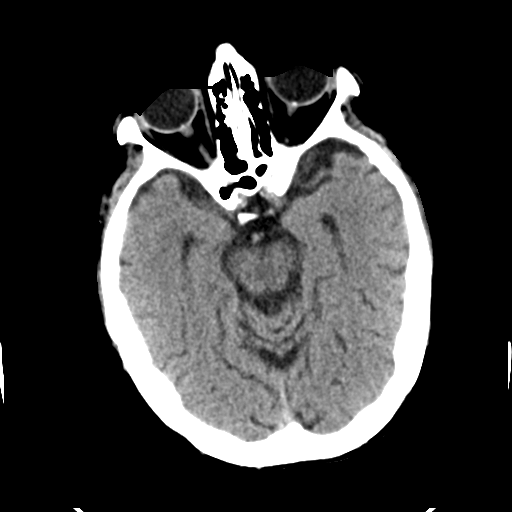
[im 18/35  brain]
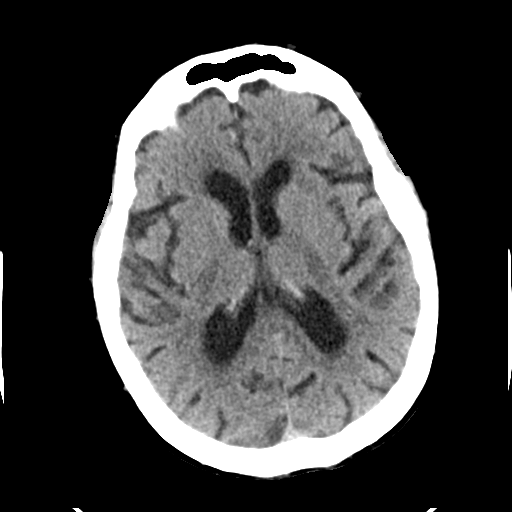
[im 18/35  bone]
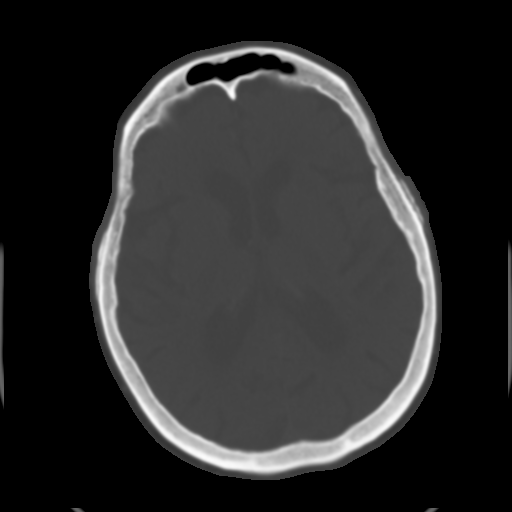
[im 22/35  brain]
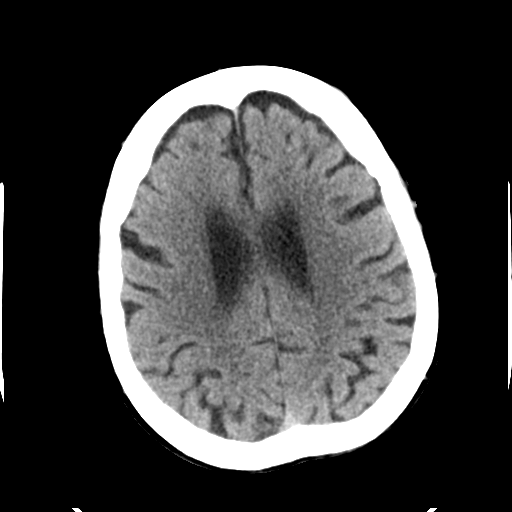
[im 25/35  brain]
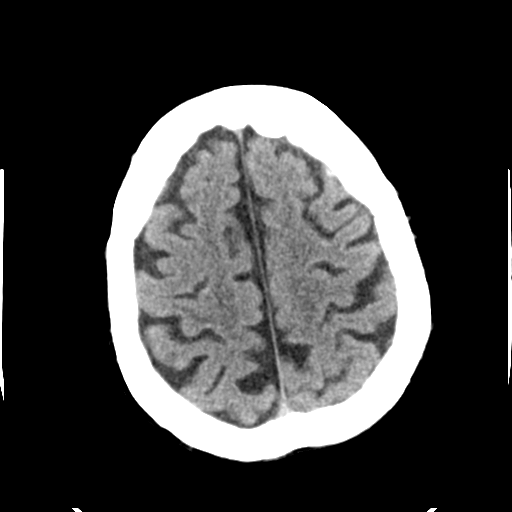
[im 29/35  brain]
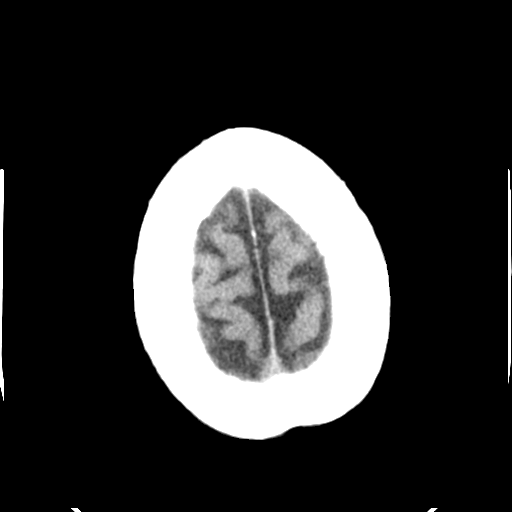
[im 32/35  brain]
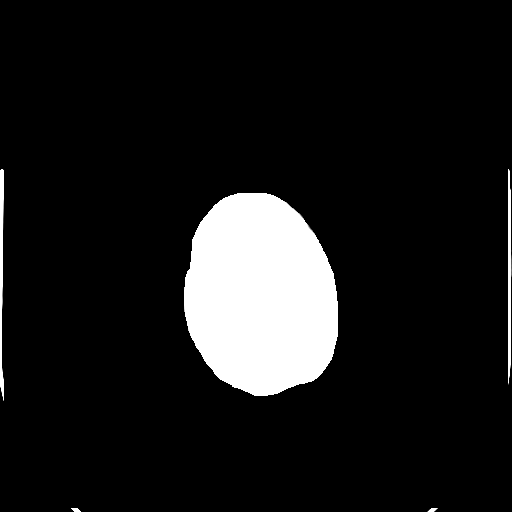
[im 32/35  bone]
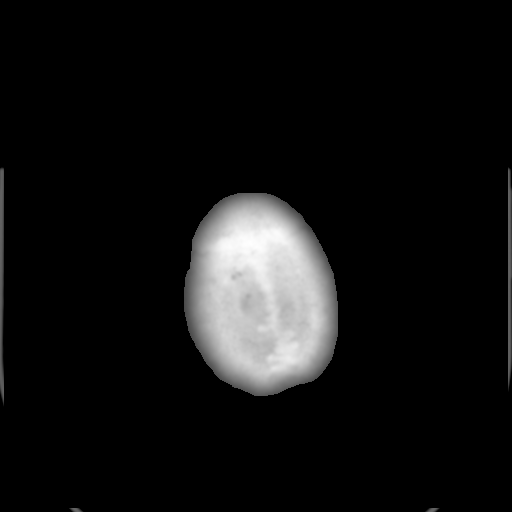

[Series 4: head 3.0 mpr cor · coronal · 0.35mm/px · 3 of 71 slices shown]
[im 24/71  brain]
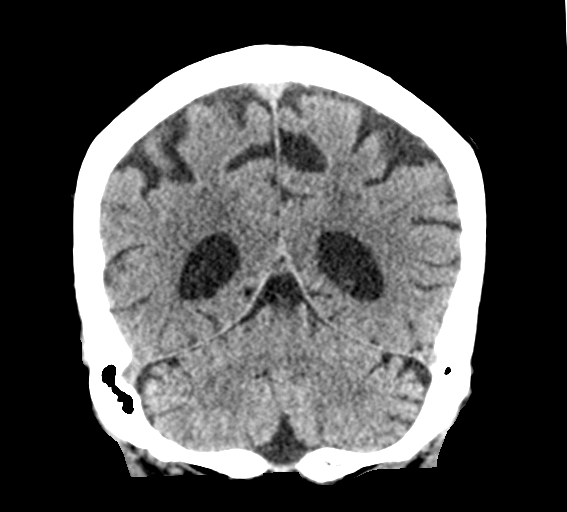
[im 32/71  brain]
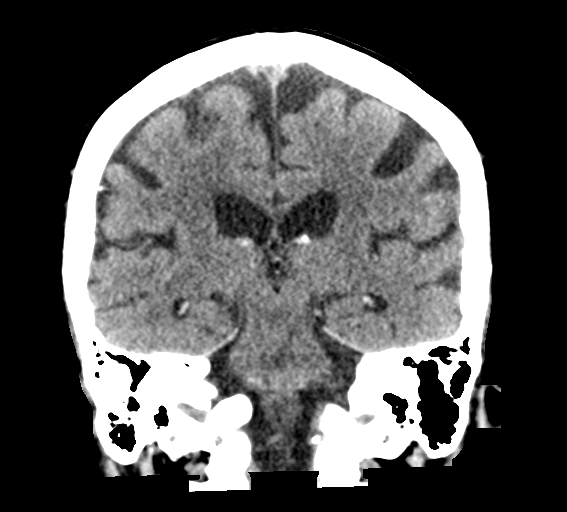
[im 39/71  brain]
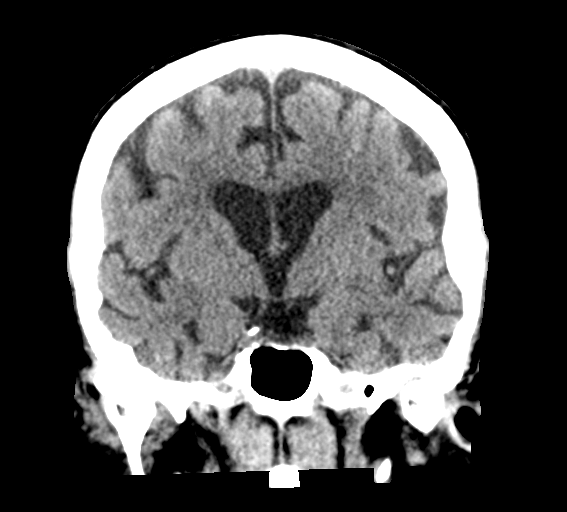

[Series 5: head 3.0 mpr sag · sagittal · 0.34mm/px · 3 of 67 slices shown]
[im 23/67  brain]
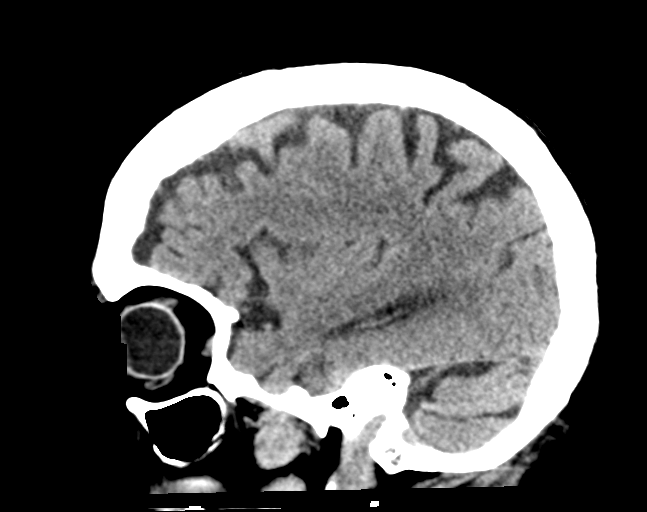
[im 34/67  brain]
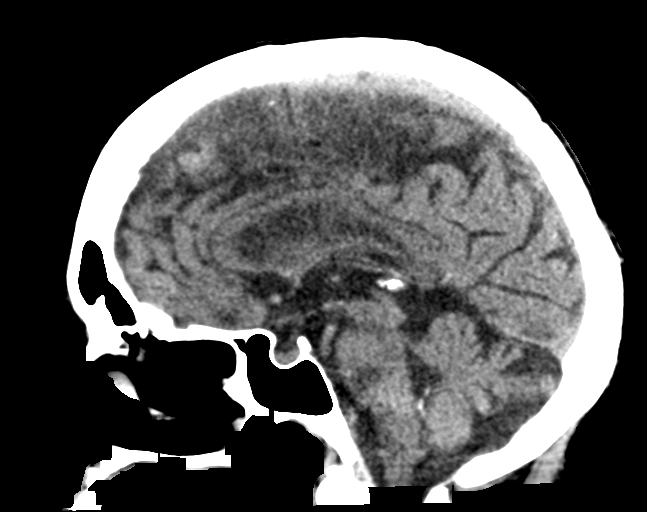
[im 45/67  brain]
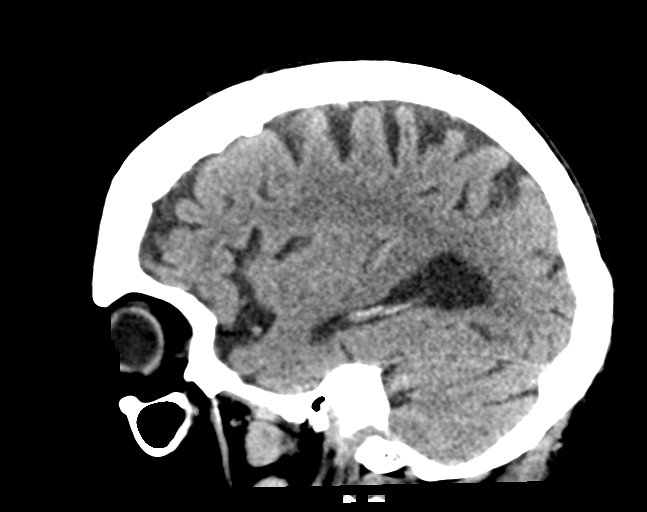

[15 of 47 positions shown; findings below may reference images not displayed]

FINDINGS: Brain: There is atrophy and chronic small vessel disease changes. No
acute intracranial abnormality. Specifically, no hemorrhage,
hydrocephalus, mass lesion, acute infarction, or significant
intracranial injury.

Vascular: No hyperdense vessel or unexpected calcification.

Skull: No acute calvarial abnormality.

Sinuses/Orbits: Visualized paranasal sinuses and mastoids clear.
Orbital soft tissues unremarkable.

Other: None
IMPRESSION: Atrophy, chronic microvascular disease.

No acute intracranial abnormality.

## 2021-03-29 IMAGING — DX DG CHEST 1V PORT
1 series · 1 of 1 positions shown · non-contrast
Comparison: 06/15/2014

CLINICAL DATA: Pre-admission evaluation, diabetes

EXAM:
PORTABLE CHEST 1 VIEW

[chest]
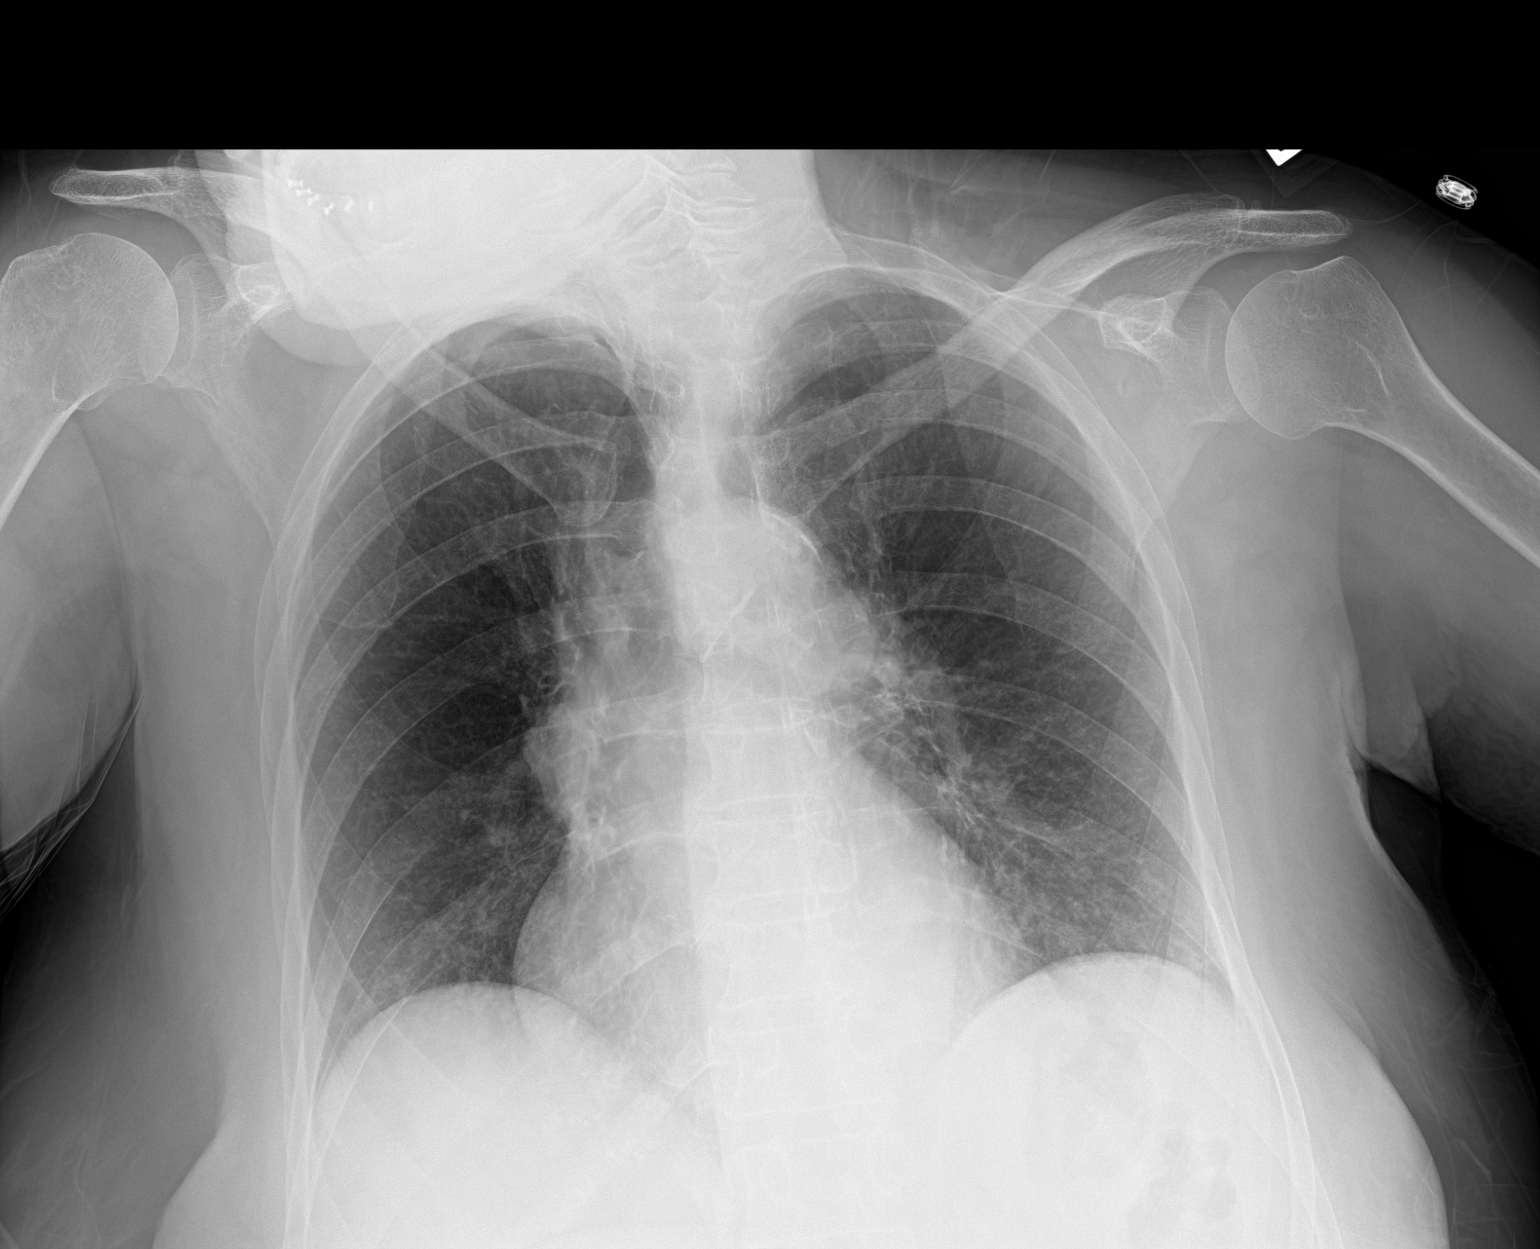

[1 of 1 positions shown; findings below may reference images not displayed]

FINDINGS: Single frontal view of the chest demonstrates an unremarkable
cardiac silhouette. Stable atherosclerosis of the thoracic aorta. No
airspace disease, effusion, or pneumothorax.
IMPRESSION: No acute cardiopulmonary disease.

## 2021-05-08 ENCOUNTER — Ambulatory Visit: Payer: Medicare Other | Admitting: Physician Assistant

## 2021-06-04 ENCOUNTER — Other Ambulatory Visit: Payer: Self-pay

## 2021-06-04 ENCOUNTER — Encounter: Payer: Self-pay | Admitting: Physician Assistant

## 2021-06-04 ENCOUNTER — Ambulatory Visit (INDEPENDENT_AMBULATORY_CARE_PROVIDER_SITE_OTHER): Admitting: Physician Assistant

## 2021-06-04 VITALS — BP 162/98 | HR 82 | Resp 18 | Ht 67.0 in | Wt 143.0 lb

## 2021-06-04 DIAGNOSIS — F03B18 Unspecified dementia, moderate, with other behavioral disturbance: Secondary | ICD-10-CM

## 2021-06-04 MED ORDER — DIVALPROEX SODIUM 250 MG PO DR TAB: 250.0000 mg | DELAYED_RELEASE_TABLET | Freq: Two times a day (BID) | ORAL | 11 refills | Status: DC

## 2021-06-04 MED ORDER — RISPERIDONE 1 MG PO TABS: 0.5000 mg | ORAL_TABLET | Freq: Every day | ORAL | 3 refills | Status: DC

## 2021-06-04 MED ORDER — DIVALPROEX SODIUM 250 MG PO DR TAB: DELAYED_RELEASE_TABLET | ORAL | 5 refills | Status: DC

## 2021-06-04 NOTE — Patient Instructions (Addendum)
It was a pleasure to see you today at our office.   Recommendations:  Follow up in 6 months Continue Donepezil 5 mg daily Continue Depakote 250 mg twice a day  Continue other medications as per your primary doctor   RECOMMENDATIONS FOR ALL PATIENTS WITH MEMORY PROBLEMS: 1. Continue to exercise (Recommend 30 minutes of walking everyday, or 3 hours every week) 2. Increase social interactions - continue going to [place] and enjoy social gatherings with friends and family 3. Eat healthy, avoid fried foods and eat more fruits and vegetables 4. Maintain adequate blood pressure, blood sugar, and blood cholesterol level. Reducing the risk of stroke and cardiovascular disease also helps promoting better memory. 5. Avoid stressful situations. Live a simple life and avoid aggravations. Organize your time and prepare for the next day in anticipation. 6. Sleep well, avoid any interruptions of sleep and avoid any distractions in the bedroom that may interfere with adequate sleep quality 7. Avoid sugar, avoid sweets as there is a strong link between excessive sugar intake, diabetes, and cognitive impairment We discussed the Mediterranean diet, which has been shown to help patients reduce the risk of progressive memory disorders and reduces cardiovascular risk. This includes eating fish, eat fruits and green leafy vegetables, nuts like almonds and hazelnuts, walnuts, and also use olive oil. Avoid fast foods and fried foods as much as possible. Avoid sweets and sugar as sugar use has been linked to worsening of memory function.  There is always a concern of gradual progression of memory problems. If this is the case, then we may need to adjust level of care according to patient needs. Support, both to the patient and caregiver, should then be put into place.      FALL PRECAUTIONS: Be cautious when walking. Scan the area for obstacles that may increase the risk of trips and falls. When getting up in the  mornings, sit up at the edge of the bed for a few minutes before getting out of bed. Consider elevating the bed at the head end to avoid drop of blood pressure when getting up. Walk always in a well-lit room (use night lights in the walls). Avoid area rugs or power cords from appliances in the middle of the walkways. Use a walker or a cane if necessary and consider physical therapy for balance exercise. Get your eyesight checked regularly.    HOME SAFETY: Consider the safety of the kitchen when operating appliances like stoves, microwave oven, and blender. Consider having supervision and share cooking responsibilities until no longer able to participate in those. Accidents with firearms and other hazards in the house should be identified and addressed as well.   ABILITY TO BE LEFT ALONE: If patient is unable to contact 911 operator, consider using LifeLine, or when the need is there, arrange for someone to stay with patients. Smoking is a fire hazard, consider supervision or cessation. Risk of wandering should be assessed by caregiver and if detected at any point, supervision and safe proof recommendations should be instituted.  MEDICATION SUPERVISION: Inability to self-administer medication needs to be constantly addressed. Implement a mechanism to ensure safe administration of the medications.

## 2021-06-04 NOTE — Progress Notes (Signed)
Assessment/Plan:    Moderate dementia with behavioral disturbance  87 year old right-handed woman with a complex history, including history of moderate dementia with behavioral disturbance, seen today in follow-up. Her memory continues to decline, with plans to transfer the patient to memory care facility.  She is on donepezil 5 mg daily.  She continues to have mild resting tremor, however EEG studies were negative for seizures.  She is on Depakote 250 mg twice daily, tolerating well.  She is also on Risperdal 0.5 mg nightly, and clonazepam as needed.    Recommendations are as follows  Discussed safety both in and out of the home.  Recommend that the patient use a walker for ambulation to prevent falls Continue to monitor mood by PCP Continue donepezil  5 mg daily Side effects  were discussed, which include nausea, vomiting, diarrhea, vivid dreams, and muscle cramps.   Continue Depakote 250 mg twice daily, Risperdal as prescribed Follow up in 6 months for closer monitoring   Case discussed with Dr. Delice Lesch who agrees with the plan    Subjective:   Kristen Bowman is an 87 year old right-handed woman with a history of hypertension, hyperlipidemia, diabetes, PMR, and with moderate dementia with behavioral disturbance seen today in follow up for memory loss.  She was last evaluated on 06/09/2020, at which time MMSE was 25/30.  She continues to live in [place], but is in the process of moving to a memory care facility.  She is currently on Donepezil 5mg  daily, Depakote DR 250mg  bid, and Risperdal 1mg  qhs and clonazepam as needed.  This patient is accompanied in the office by her family member who supplements the history.  Previous records as well as any outside records available were reviewed prior to todays visit.   Her memory is overall decreased since her last visit.  She is not participating in many activities.  She does visit her family members in [place].  No hallucinations or  paranoia.  She sleeps better from prior visit.  Denies vivid dreams or sleepwalking.  She needs assistance with bathing and dressing up.  She uses a walker to ambulate.  Her appetite is good, denies trouble swallowing. She denies headaches, trauma, injuries to the head, double vision, dizziness, focal numbness or tingling, unilateral weaknessShe also is incontinent of urine and wears a diaper.  Denies constipation or diarrhea.   History on Initial Assessment 10/28/2019: This is an 87 year old right-handed woman with a history of hypertension, hyperlipidemia, diabetes, PMR, presenting for evaluation of memory loss. She lives with her son [name]. Her daughter [name] lives next door. She feels her memory is not good, she does not remember a lot. [name] started noticing changes in 2019 while they were at an [employer] in [place]. Two days after they got home, she started saying that he had not taken her to the beach in a couple of years. He feels there was a big drastic change since then. He has been living with her for the past 12 years, making sure she eats and administering her insulin, but saying she was always pretty self-sufficient. In October 2020, he found her outside in the backyard, very confused, with a flipflop on one foot and a boot in another foot. She was hallucinating, thinking she was in [facility]. Since then, symptoms progressed that he felt she needed IVC last 10/16/19. She was wandering outside, talking to her deceased father. Prior to this, she went missing earlier in the week. She was not  sleeping. There is no prior psychiatric history except for panic attacks in the 1990s. She has been taking clonazepam qhs for several years. This past weekend she would repeatedly say she is not home and she has to get out of there. They had to change the locks in the house to keep her inside. On Tuesday night she went outside with her daughter, she was out from 2pm until she got tired at 8pm. She states  she still cooks, [name] fixes meals. [name] reports some paranoia. She stopped driving in September 2020. Her paternal aunt and father had memory issues. No significant head injuries or alcohol use. She denies any headaches, dizziness, diplopia, dysarthria/dysphagia, neck pain, focal numbness/tingling/weakness, no falls. She has occasional urinary incontinence. She needs assistance with bathing, getting confused with the water and temperature.    I personally reviewed head CT without contrast done 09/2019 which did not show any acute changes. There was moderate atrophy and mild chronic microvascular disease.   Most recent MRI brain without contrast 09/09/20 No evidence of acute intracranial abnormality.  No acute infarct . Moderate generalized cerebral atrophy  CT head without Contrast 01/02/21 Mild atrophy with slight periventricular small vessel disease. No acute infarct. No mass or hemorrhage. There are foci of arterial vascular calcification. Small retention cyst in left ethmoid sinus noted.    PREVIOUS MEDICATIONS: none  CURRENT MEDICATIONS:  Outpatient Encounter Medications as of 06/04/2021  Medication Sig   acetaminophen (TYLENOL) 325 MG tablet Take 650 mg by mouth every 4 (four) hours as needed for moderate pain.   amLODipine (NORVASC) 2.5 MG tablet Take 2.5 mg by mouth daily.   atorvastatin (LIPITOR) 40 MG tablet Take 40 mg by mouth daily.   clonazePAM (KLONOPIN) 0.5 MG tablet Take 0.25 mg by mouth at bedtime as needed for anxiety.   clonazePAM (KLONOPIN) 0.5 MG tablet Take 0.25 mg by mouth in the morning.   cyanocobalamin (,VITAMIN B-12,) 1000 MCG/ML injection Inject 1,000 mcg into the skin every Friday.   donepezil (ARICEPT) 5 MG tablet Take 5 mg by mouth at bedtime.   insulin lispro (HUMALOG) 100 UNIT/ML KwikPen Inject 7 Units into the skin 3 (three) times daily before meals.   Insulin NPH, Human,, Isophane, (HUMULIN N KWIKPEN) 100 UNIT/ML Kiwkpen Inject 8-15 Units into the skin See admin  instructions. Inject 15 units subcutaneously in the morning and inject 8 units subcutaneously at bedtime.   losartan (COZAAR) 100 MG tablet Take 100 mg by mouth daily.    metFORMIN (GLUCOPHAGE-XR) 500 MG 24 hr tablet Take 500 mg by mouth daily.    metoprolol tartrate (LOPRESSOR) 50 MG tablet Take 50 mg by mouth 2 (two) times daily.   potassium chloride SA (KLOR-CON) 20 MEQ tablet Take 1 tablet (20 mEq total) by mouth daily.   [DISCONTINUED] divalproex (DEPAKOTE) 250 MG DR tablet Take 1 tablet every night (Patient taking differently: Take 250 mg by mouth 2 (two) times daily.)   [DISCONTINUED] risperiDONE (RISPERDAL) 1 MG tablet Take 0.5 tablets (0.5 mg total) by mouth at bedtime.   divalproex (DEPAKOTE) 250 MG DR tablet Take 1 tablet (250 mg total) by mouth 2 (two) times daily.   nitrofurantoin, macrocrystal-monohydrate, (MACROBID) 100 MG capsule Take 1 capsule (100 mg total) by mouth 2 (two) times daily. (Patient not taking: Reported on 06/04/2021)   risperiDONE (RISPERDAL) 1 MG tablet Take 0.5 tablets (0.5 mg total) by mouth at bedtime.   [DISCONTINUED] divalproex (DEPAKOTE) 250 MG DR tablet Take 1 tablet every night  No facility-administered encounter medications on file as of 06/04/2021.     Objective:     PHYSICAL EXAMINATION:    VITALS:   Vitals:   06/04/21 1322  BP: (!) 162/98  Pulse: 82  Resp: 18  SpO2: 96%  Weight: 143 lb (64.9 kg)  Height: 5\' 7"  (1.702 m)     GEN:  The patient appears stated age and is in NAD. HEENT:  Normocephalic, atraumatic.   Neurological examination:  General: NAD, mildly disheveled, appears stated age. Orientation: The patient is alert. Oriented to person, not to place or date     Cranial nerves: There is good facial symmetry.The speech is not fluent, but clear. Voice is soft . No aphasia or dysarthria.  Fund of knowledge is reduced.  recent and remote memory are impaired. Attention and concentration are reduced.  Unable to name objects and  repeat phrases.  Hearing is intact to conversational tone.    Sensation: Sensation is intact to light touch throughout Motor: Strength is at least antigravity x4.  No flowsheet data found.   MMSE - Mini Mental State Exam 01/29/2021 06/09/2020  Not completed: (No Data) -  Orientation to time 0 3  Orientation to Place 3 5  Registration 1 3  Attention/ Calculation 0 5  Recall - 1  Language- name 2 objects - 2  Language- repeat - 1  Language- follow 3 step command - 2  Language- read & follow direction - 1  Write a sentence - 1  Copy design - 1  Total score - 25      Movement examination: Tone: There is normal tone in the UE/LE Abnormal movements: Right greater than left tremor.  No myoclonus.  No asterixis.  Right greater than left cogwheel due to rigidity. Coordination:  There is mild decremation with RAM's.  Abnormal finger to nose, cannot understand instructions Gait and Station: The patient has  difficulty arising out of a deep-seated chair without the use of the hands.  Unable to assess her gait and her stride  CBC CBC Latest Ref Rng & Units 02/12/2021 01/31/2021 09/08/2020  WBC 4.0 - 10.5 K/uL 9.2 6.6 6.0  Hemoglobin 12.0 - 15.0 g/dL 11.6(L) 11.4(L) 12.6  Hematocrit 36.0 - 46.0 % 36.0 36.4 37.9  Platelets 150 - 400 K/uL 151 140(L) 140(L)     CMP Latest Ref Rng & Units 02/12/2021 01/31/2021 09/08/2020  Glucose 70 - 99 mg/dL 72 158(H) 157(H)  BUN 8 - 23 mg/dL 20 21 19   Creatinine 0.44 - 1.00 mg/dL 1.05(H) 1.09(H) 1.27(H)  Sodium 135 - 145 mmol/L 140 143 140  Potassium 3.5 - 5.1 mmol/L 3.4(L) 3.1(L) 3.7  Chloride 98 - 111 mmol/L 107 103 103  CO2 22 - 32 mmol/L 27 29 24   Calcium 8.9 - 10.3 mg/dL 9.0 9.0 9.0  Total Protein 6.5 - 8.1 g/dL 6.2(L) 6.2(L) 6.3(L)  Total Bilirubin 0.3 - 1.2 mg/dL 0.9 0.8 0.8  Alkaline Phos 38 - 126 U/L 56 58 57  AST 15 - 41 U/L 16 26 23   ALT 0 - 44 U/L 11 14 17        Total time spent on today's visit was 40 minutes, including both face-to-face  time and nonface-to-face time.  Time included that spent on review of records (prior notes available to me/labs/imaging if pertinent), discussing treatment and goals, answering patient's questions and coordinating care.  Cc:  Pcp, No Sharene Butters, PA-C

## 2021-08-22 ENCOUNTER — Other Ambulatory Visit: Payer: Self-pay

## 2021-08-22 ENCOUNTER — Encounter (HOSPITAL_BASED_OUTPATIENT_CLINIC_OR_DEPARTMENT_OTHER): Payer: Self-pay | Admitting: Emergency Medicine

## 2021-08-22 ENCOUNTER — Emergency Department (HOSPITAL_BASED_OUTPATIENT_CLINIC_OR_DEPARTMENT_OTHER)
Admission: EM | Admit: 2021-08-22 | Discharge: 2021-08-23 | Disposition: A | Discharge status: Skilled Nursing Facility | Attending: Emergency Medicine | Admitting: Emergency Medicine

## 2021-08-22 ENCOUNTER — Emergency Department (HOSPITAL_BASED_OUTPATIENT_CLINIC_OR_DEPARTMENT_OTHER)

## 2021-08-22 DIAGNOSIS — S199XXA Unspecified injury of neck, initial encounter: Secondary | ICD-10-CM | POA: Diagnosis not present

## 2021-08-22 DIAGNOSIS — N182 Chronic kidney disease, stage 2 (mild): Secondary | ICD-10-CM | POA: Diagnosis not present

## 2021-08-22 DIAGNOSIS — M542 Cervicalgia: Secondary | ICD-10-CM | POA: Insufficient documentation

## 2021-08-22 DIAGNOSIS — Z794 Long term (current) use of insulin: Secondary | ICD-10-CM | POA: Diagnosis not present

## 2021-08-22 DIAGNOSIS — S0003XA Contusion of scalp, initial encounter: Secondary | ICD-10-CM | POA: Diagnosis not present

## 2021-08-22 DIAGNOSIS — W19XXXA Unspecified fall, initial encounter: Secondary | ICD-10-CM

## 2021-08-22 DIAGNOSIS — Z7984 Long term (current) use of oral hypoglycemic drugs: Secondary | ICD-10-CM | POA: Insufficient documentation

## 2021-08-22 DIAGNOSIS — R739 Hyperglycemia, unspecified: Secondary | ICD-10-CM | POA: Diagnosis not present

## 2021-08-22 DIAGNOSIS — W01198A Fall on same level from slipping, tripping and stumbling with subsequent striking against other object, initial encounter: Secondary | ICD-10-CM | POA: Insufficient documentation

## 2021-08-22 DIAGNOSIS — E1165 Type 2 diabetes mellitus with hyperglycemia: Secondary | ICD-10-CM | POA: Insufficient documentation

## 2021-08-22 DIAGNOSIS — Y92129 Unspecified place in nursing home as the place of occurrence of the external cause: Secondary | ICD-10-CM | POA: Insufficient documentation

## 2021-08-22 DIAGNOSIS — Z79899 Other long term (current) drug therapy: Secondary | ICD-10-CM | POA: Diagnosis not present

## 2021-08-22 DIAGNOSIS — G319 Degenerative disease of nervous system, unspecified: Secondary | ICD-10-CM | POA: Diagnosis not present

## 2021-08-22 DIAGNOSIS — F0390 Unspecified dementia without behavioral disturbance: Secondary | ICD-10-CM | POA: Insufficient documentation

## 2021-08-22 DIAGNOSIS — E1122 Type 2 diabetes mellitus with diabetic chronic kidney disease: Secondary | ICD-10-CM | POA: Diagnosis not present

## 2021-08-22 DIAGNOSIS — I129 Hypertensive chronic kidney disease with stage 1 through stage 4 chronic kidney disease, or unspecified chronic kidney disease: Secondary | ICD-10-CM | POA: Insufficient documentation

## 2021-08-22 DIAGNOSIS — I10 Essential (primary) hypertension: Secondary | ICD-10-CM | POA: Diagnosis not present

## 2021-08-22 DIAGNOSIS — S0990XA Unspecified injury of head, initial encounter: Secondary | ICD-10-CM | POA: Insufficient documentation

## 2021-08-22 DIAGNOSIS — M5030 Other cervical disc degeneration, unspecified cervical region: Secondary | ICD-10-CM | POA: Diagnosis not present

## 2021-08-22 LAB — CBC WITH DIFFERENTIAL/PLATELET
Abs Immature Granulocytes: 0.01 10*3/uL (ref 0.00–0.07)
Basophils Absolute: 0 10*3/uL (ref 0.0–0.1)
Basophils Relative: 0 %
Eosinophils Absolute: 0.1 10*3/uL (ref 0.0–0.5)
Eosinophils Relative: 2 %
HCT: 33.7 % — ABNORMAL LOW (ref 36.0–46.0)
Hemoglobin: 11.1 g/dL — ABNORMAL LOW (ref 12.0–15.0)
Immature Granulocytes: 0 %
Lymphocytes Relative: 20 %
Lymphs Abs: 1.1 10*3/uL (ref 0.7–4.0)
MCH: 28.2 pg (ref 26.0–34.0)
MCHC: 32.9 g/dL (ref 30.0–36.0)
MCV: 85.8 fL (ref 80.0–100.0)
Monocytes Absolute: 0.4 10*3/uL (ref 0.1–1.0)
Monocytes Relative: 8 %
Neutro Abs: 3.8 10*3/uL (ref 1.7–7.7)
Neutrophils Relative %: 70 %
Platelets: 160 10*3/uL (ref 150–400)
RBC: 3.93 MIL/uL (ref 3.87–5.11)
RDW: 14.6 % (ref 11.5–15.5)
WBC: 5.5 10*3/uL (ref 4.0–10.5)
nRBC: 0 % (ref 0.0–0.2)

## 2021-08-22 LAB — BASIC METABOLIC PANEL
Anion gap: 8 (ref 5–15)
BUN: 18 mg/dL (ref 8–23)
CO2: 27 mmol/L (ref 22–32)
Calcium: 8.7 mg/dL — ABNORMAL LOW (ref 8.9–10.3)
Chloride: 97 mmol/L — ABNORMAL LOW (ref 98–111)
Creatinine, Ser: 0.99 mg/dL (ref 0.44–1.00)
GFR, Estimated: 55 mL/min — ABNORMAL LOW (ref >60)
Glucose, Bld: 455 mg/dL — ABNORMAL HIGH (ref 70–99)
Potassium: 3.3 mmol/L — ABNORMAL LOW (ref 3.5–5.1)
Sodium: 132 mmol/L — ABNORMAL LOW (ref 135–145)

## 2021-08-22 LAB — CBG MONITORING, ED
Glucose-Capillary: 303 mg/dL — ABNORMAL HIGH (ref 70–99)
Glucose-Capillary: 483 mg/dL — ABNORMAL HIGH (ref 70–99)

## 2021-08-22 MED ORDER — SODIUM CHLORIDE 0.9 % IV BOLUS
500.0000 mL | Freq: Once | INTRAVENOUS | Status: AC
  Administered 2021-08-22: 23:00:00 500 mL via INTRAVENOUS

## 2021-08-22 MED ORDER — INSULIN ASPART 100 UNIT/ML IJ SOLN
5.0000 [IU] | Freq: Once | INTRAMUSCULAR | Status: AC
  Administered 2021-08-22: 23:00:00 5 [IU] via SUBCUTANEOUS

## 2021-08-22 MED ORDER — POTASSIUM CHLORIDE CRYS ER 20 MEQ PO TBCR
40.0000 meq | EXTENDED_RELEASE_TABLET | Freq: Once | ORAL | Status: AC
  Administered 2021-08-22: 40 meq via ORAL
  Filled 2021-08-22: qty 2

## 2021-08-22 NOTE — ED Triage Notes (Signed)
Per ems pt was washing foot in sink then fell back and hit head has swelling on left rear. No complaints, no pain. is at baseline dementia.

## 2021-08-22 NOTE — ED Provider Notes (Signed)
[facility] EMERGENCY DEPARTMENT Provider Note   CSN: [id number] Arrival date & time: 08/22/21  2042     History Chief Complaint  Patient presents with   Kristen Bowman is a 87 y.o. female.  Kristen Bowman is a 87 y.o. female with hx of dementia, HTN, DM, PMR, who presents for evaluation via EMS from [facility] after a witnessed fall. Facility reports no LOC and pt is at baseline mental status with her dementia. Patient reports she was trying to wash her foot off in the sink and that when she put her wet foot back down on the floor this caused her to slip falling backwards and hitting her head. No LOC, pt with full memory of the event. Pt reports mild pain to the back of her head and neck, but no other complaints after the fall. Pt is not on blood thinners. Pt noted to be hyperglycemic with EMS. Pt reports they have been working on her blood sugar at facility. No associated dizziness, nausea, vomiting. No other complaints.  The history is provided by the patient and the EMS personnel.      Past Medical History:  Diagnosis Date   Anemia    Anxiety states    Diabetes mellitus    T2DM with renal manifestations   Generalized osteoarthrosis, unspecified site    Hematuria    Hyperlipidemia    mixed   Hypertension    Irritable bowel syndrome    Other specified cardiac dysrhythmias(427.89)    atrial fibrilation   Panic disorder    Polymyalgia rheumatica (HCC)    Proteinuria 05/29/10   Type II or unspecified type diabetes mellitus with renal manifestations, uncontrolled(250.42) 05/29/10   CKD II (mild)    Patient Active Problem List   Diagnosis Date Noted   Dementia ([place]) 10/17/2019   Pulmonary hypertension ([place]) 06/09/2014   Tachycardia 09/24/2012   Palpitations 09/24/2012   CKD (chronic kidney disease), stage II 09/24/2012   Anemia, iron deficiency 09/24/2012   Hypertension    Panic disorder    Hyperlipidemia    Type II or  unspecified type diabetes mellitus with renal manifestations, uncontrolled(250.42) 05/29/2010    Past Surgical History:  Procedure Laterality Date   CATARACT EXTRACTION     right   COLONOSCOPY N/A 11/04/2012   Procedure: COLONOSCOPY;  Surgeon: Arta Silence, MD;  Location: [facility];  Service: Endoscopy;  Laterality: N/A;   ESOPHAGOGASTRODUODENOSCOPY N/A 11/04/2012   Procedure: ESOPHAGOGASTRODUODENOSCOPY (EGD);  Surgeon: Arta Silence, MD;  Location: [facility];  Service: Endoscopy;  Laterality: N/A;   TUBAL LIGATION       OB History   No obstetric history on file.     Family History  Problem Relation Age of Onset   Diabetes Sister    Cancer Other    Heart attack Other    Emphysema Mother    Leukemia Father     Social History   Tobacco Use   Smoking status: Never   Smokeless tobacco: Never  Vaping Use   Vaping Use: Never used  Substance Use Topics   Alcohol use: No   Drug use: No    Home Medications Prior to Admission medications   Medication Sig Start Date End Date Taking? Authorizing Provider  acetaminophen (TYLENOL) 325 MG tablet Take 650 mg by mouth every 4 (four) hours as needed for moderate pain.    [provider]  amLODipine (NORVASC) 2.5 MG tablet Take 2.5 mg by mouth  daily. 06/02/20   [provider]  atorvastatin (LIPITOR) 40 MG tablet Take 40 mg by mouth daily.    [provider]  clonazePAM (KLONOPIN) 0.5 MG tablet Take 0.25 mg by mouth at bedtime as needed for anxiety. 05/10/20   [provider]  clonazePAM (KLONOPIN) 0.5 MG tablet Take 0.25 mg by mouth in the morning.    [provider]  cyanocobalamin (,VITAMIN B-12,) 1000 MCG/ML injection Inject 1,000 mcg into the skin every Friday.    [provider]  divalproex (DEPAKOTE) 250 MG DR tablet Take 1 tablet (250 mg total) by mouth 2 (two) times daily. 06/04/21   Rondel Jumbo, PA-C  donepezil (ARICEPT) 5 MG tablet Take 5 mg by mouth at bedtime.  12/01/19   [provider]  insulin lispro (HUMALOG) 100 UNIT/ML KwikPen Inject 7 Units into the skin 3 (three) times daily before meals. 07/22/20   [provider]  Insulin NPH, Human,, Isophane, (HUMULIN N KWIKPEN) 100 UNIT/ML Kiwkpen Inject 8-15 Units into the skin See admin instructions. Inject 15 units subcutaneously in the morning and inject 8 units subcutaneously at bedtime.    [provider]  losartan (COZAAR) 100 MG tablet Take 100 mg by mouth daily.  08/08/16   [provider]  metFORMIN (GLUCOPHAGE-XR) 500 MG 24 hr tablet Take 500 mg by mouth daily.  08/14/16   [provider]  metoprolol tartrate (LOPRESSOR) 50 MG tablet Take 50 mg by mouth 2 (two) times daily. 08/24/20   [provider]  nitrofurantoin, macrocrystal-monohydrate, (MACROBID) 100 MG capsule Take 1 capsule (100 mg total) by mouth 2 (two) times daily. Patient not taking: Reported on 06/04/2021 02/12/21   Truddie Hidden, MD  potassium chloride SA (KLOR-CON) 20 MEQ tablet Take 1 tablet (20 mEq total) by mouth daily. 01/31/21   Lajean Saver, MD  risperiDONE (RISPERDAL) 1 MG tablet Take 0.5 tablets (0.5 mg total) by mouth at bedtime. 06/04/21   Rondel Jumbo, PA-C    Allergies    Ace inhibitors and Tape  Review of Systems   Review of Systems  Constitutional:  Negative for chills and fever.  HENT: Negative.    Respiratory:  Negative for cough and shortness of breath.   Cardiovascular:  Negative for chest pain.  Gastrointestinal:  Negative for abdominal pain.  Endocrine: Negative for polyphagia and polyuria.  Musculoskeletal:  Positive for neck pain. Negative for arthralgias, back pain and myalgias.  Skin:  Negative for wound.  Neurological:  Positive for headaches. Negative for dizziness, syncope, facial asymmetry, weakness and numbness.  All other systems reviewed and are negative.  Physical Exam Updated Vital Signs BP (!) 194/84    Pulse 70    Temp 98.3 F  (36.8 C) (Oral)    Resp 18    Wt 65.5 kg    SpO2 97%    BMI 22.63 kg/m   Physical Exam Vitals and nursing note reviewed.  Constitutional:      General: She is not in acute distress.    Appearance: Normal appearance. She is well-developed. She is not ill-appearing or diaphoretic.  HENT:     Head: Normocephalic.     Comments: Small posterior scalp hematoma, no lacerations, no step off or deformity, neg battle sign Eyes:     General:        Right eye: No discharge.        Left eye: No discharge.     Extraocular Movements: Extraocular movements intact.  Pupils: Pupils are equal, round, and reactive to light.  Neck:     Comments: Mild posterior neck tenderness, no step off or deformity Cardiovascular:     Rate and Rhythm: Normal rate and regular rhythm.     Pulses: Normal pulses.     Heart sounds: Normal heart sounds.  Pulmonary:     Effort: Pulmonary effort is normal. No respiratory distress.     Breath sounds: Normal breath sounds. No wheezing or rales.     Comments: Respirations equal and unlabored, patient able to speak in full sentences, lungs clear to auscultation bilaterally  Chest:     Chest wall: No tenderness.  Abdominal:     General: Bowel sounds are normal. There is no distension.     Palpations: Abdomen is soft. There is no mass.     Tenderness: There is no abdominal tenderness. There is no guarding.     Comments: Abdomen soft, nondistended, nontender to palpation in all quadrants without guarding or peritoneal signs  Musculoskeletal:        General: No deformity.     Cervical back: Neck supple. Tenderness present.     Comments: No midline thoracic or lumbar spinal tenderness All joints supple and easily moveable  Skin:    General: Skin is warm and dry.     Capillary Refill: Capillary refill takes less than 2 seconds.  Neurological:     Mental Status: She is alert and oriented to person, place, and time.     Coordination: Coordination normal.     Comments:  Speech is clear, able to follow commands CN III-XII intact Normal strength in upper and lower extremities bilaterally including dorsiflexion and plantar flexion, strong and equal grip strength Sensation normal to light and sharp touch Moves extremities without ataxia, coordination intact  Psychiatric:        Mood and Affect: Mood normal.        Behavior: Behavior normal.    ED Results / Procedures / Treatments   Labs (all labs ordered are listed, but only abnormal results are displayed) Labs Reviewed  BASIC METABOLIC PANEL - Abnormal; Notable for the following components:      Result Value   Sodium 132 (*)    Potassium 3.3 (*)    Chloride 97 (*)    Glucose, Bld 455 (*)    Calcium 8.7 (*)    GFR, Estimated 55 (*)    All other components within normal limits  CBC WITH DIFFERENTIAL/PLATELET - Abnormal; Notable for the following components:   Hemoglobin 11.1 (*)    HCT 33.7 (*)    All other components within normal limits  CBG MONITORING, ED - Abnormal; Notable for the following components:   Glucose-Capillary 483 (*)    All other components within normal limits    EKG None  Radiology CT Head Wo Contrast  Result Date: 08/22/2021 CLINICAL DATA:  Trauma. EXAM: CT HEAD WITHOUT CONTRAST CT CERVICAL SPINE WITHOUT CONTRAST TECHNIQUE: Multidetector CT imaging of the head and cervical spine was performed following the standard protocol without intravenous contrast. Multiplanar CT image reconstructions of the cervical spine were also generated. COMPARISON:  02/12/2021 FINDINGS: CT HEAD FINDINGS Brain: No evidence of acute infarction, hemorrhage, hydrocephalus, extra-axial collection or mass lesion/mass effect. Prominence of sulci and ventricles compatible with brain atrophy. There is mild low-attenuation within the subcortical and periventricular white matter compatible with chronic microvascular disease. Vascular: No hyperdense vessel or unexpected calcification. Skull: Normal. Negative  for fracture or focal lesion. Sinuses/Orbits: No  acute finding. Other: None. CT CERVICAL SPINE FINDINGS Alignment: Normal. Skull base and vertebrae: No acute fracture. No primary bone lesion or focal pathologic process. Soft tissues and spinal canal: No prevertebral fluid or swelling. No visible canal hematoma. Disc levels: Multilevel disc space narrowing and ventral endplate spurring is identified at C3-4 C4-5 and C5-6. Upper chest: Negative. Other: None IMPRESSION: 1. No acute intracranial abnormalities. 2. Chronic small vessel ischemic change and brain atrophy. 3. No evidence for cervical spine fracture. 4. Cervical degenerative disc disease. Electronically Signed   By: Kerby Moors M.D.   On: 08/22/2021 21:54   CT Cervical Spine Wo Contrast  Result Date: 08/22/2021 CLINICAL DATA:  Trauma. EXAM: CT HEAD WITHOUT CONTRAST CT CERVICAL SPINE WITHOUT CONTRAST TECHNIQUE: Multidetector CT imaging of the head and cervical spine was performed following the standard protocol without intravenous contrast. Multiplanar CT image reconstructions of the cervical spine were also generated. COMPARISON:  02/12/2021 FINDINGS: CT HEAD FINDINGS Brain: No evidence of acute infarction, hemorrhage, hydrocephalus, extra-axial collection or mass lesion/mass effect. Prominence of sulci and ventricles compatible with brain atrophy. There is mild low-attenuation within the subcortical and periventricular white matter compatible with chronic microvascular disease. Vascular: No hyperdense vessel or unexpected calcification. Skull: Normal. Negative for fracture or focal lesion. Sinuses/Orbits: No acute finding. Other: None. CT CERVICAL SPINE FINDINGS Alignment: Normal. Skull base and vertebrae: No acute fracture. No primary bone lesion or focal pathologic process. Soft tissues and spinal canal: No prevertebral fluid or swelling. No visible canal hematoma. Disc levels: Multilevel disc space narrowing and ventral endplate spurring is  identified at C3-4 C4-5 and C5-6. Upper chest: Negative. Other: None IMPRESSION: 1. No acute intracranial abnormalities. 2. Chronic small vessel ischemic change and brain atrophy. 3. No evidence for cervical spine fracture. 4. Cervical degenerative disc disease. Electronically Signed   By: Kerby Moors M.D.   On: 08/22/2021 21:54    Procedures Procedures   Medications Ordered in ED Medications  sodium chloride 0.9 % bolus 500 mL (500 mLs Intravenous New Bag/Given 08/22/21 2238)  insulin aspart (novoLOG) injection 5 Units (5 Units Subcutaneous Given 08/22/21 2243)    ED Course  I have reviewed the triage vital signs and the nursing notes.  Pertinent labs & imaging results that were available during my care of the patient were reviewed by me and considered in my medical decision making (see chart for details).    MDM Rules/Calculators/A&P                         87 yo female presents after mechanical fall, striking the back of her head and her neck. No LOC and no focal neuro deficits. Not on anticoagulation. Pt also noted to be hyperglycemic, well appearing, with normal vitals, low suspicion for DKA but will check basic labs and given fluids and SQ insulin for hyperglycemia. Given patients age CT of the head and cervical spine ordered. No other signs of injury or trauma from the fall. Do not feel additional imaging is indicated.     Additional history obtained:  Additional history obtained from EMS and SNF External records from outside source obtained and reviewed    Lab Tests:  I Ordered, reviewed, and interpreted labs.  The pertinent results include:  CBG 455, but normal CO2 and anion gap, no concern for DKA   Imaging Studies ordered:  I ordered imaging studies including CT head and cervical spine  I independently visualized and interpreted imaging which showed No  acute intracranial abnormalities. No evidence for cervical spine fracture. Cervical degenerative disc disease I  agree with the radiologist interpretation    Medicines ordered and prescription drug management:  I ordered medication including flluids and SQ insulin for hyperglycemia Reevaluation of the patient after these medicines showed that the patient improved I have reviewed the patients home medicines and have made adjustments as needed    Reevaluation:  After the interventions noted above, I reevaluated the patient and found that they have :improved   Dispostion:  After consideration of the diagnostic results and the patients response to treatment feel that the patent is appropriate fir discharge back to her skilled nursing facility    Final Clinical Impression(s) / ED Diagnoses Final diagnoses:  Fall, initial encounter  Injury of head, initial encounter  Hyperglycemia    Rx / DC Orders ED Discharge Orders     None        Jacqlyn Larsen, PA-C 08/31/21 [place], [place], DO 09/03/21 1503

## 2021-08-22 NOTE — Discharge Instructions (Signed)
CTs of the head and neck show no evidence of acute injury from fall.  Blood sugar was elevated today, continue taking diabetes medications and monitoring blood sugar closely.  Return for new or worsening symptoms.

## 2021-08-23 DIAGNOSIS — R279 Unspecified lack of coordination: Secondary | ICD-10-CM | POA: Diagnosis not present

## 2021-08-23 DIAGNOSIS — Z743 Need for continuous supervision: Secondary | ICD-10-CM | POA: Diagnosis not present

## 2021-08-23 DIAGNOSIS — R531 Weakness: Secondary | ICD-10-CM | POA: Diagnosis not present

## 2021-08-23 NOTE — ED Notes (Signed)
[facility] called there are 6 transports before pt.

## 2021-08-23 NOTE — ED Notes (Signed)
Pt incontinent urine. Pt cleaned, placed in hospital gown and external catheter placed.

## 2021-08-23 NOTE — ED Notes (Signed)
Report given to Debra at [facility] has been called to transport pt back to facility

## 2021-08-23 NOTE — ED Notes (Signed)
[facility] called for transport.  

## 2021-12-03 ENCOUNTER — Ambulatory Visit (INDEPENDENT_AMBULATORY_CARE_PROVIDER_SITE_OTHER): Payer: Medicare Other | Admitting: Physician Assistant

## 2021-12-03 ENCOUNTER — Encounter: Payer: Self-pay | Admitting: Physician Assistant

## 2021-12-03 VITALS — BP 184/84 | HR 79 | Resp 20 | Ht 68.0 in | Wt 120.0 lb

## 2021-12-03 DIAGNOSIS — F03B18 Unspecified dementia, moderate, with other behavioral disturbance: Secondary | ICD-10-CM

## 2021-12-03 NOTE — Patient Instructions (Signed)
It was a pleasure to see you today at our office.  ? ?Recommendations: ? ?Follow up in 6 months ?Continue Donepezil 5 mg daily ?Continue Depakote 250 mg twice a day  ?Continue other medications as per your primary doctor  ? ?RECOMMENDATIONS FOR ALL PATIENTS WITH MEMORY PROBLEMS: ?1. Continue to exercise (Recommend 30 minutes of walking everyday, or 3 hours every week) ?2. Increase social interactions - continue going to [place] and enjoy social gatherings with friends and family ?3. Eat healthy, avoid fried foods and eat more fruits and vegetables ?4. Maintain adequate blood pressure, blood sugar, and blood cholesterol level. Reducing the risk of stroke and cardiovascular disease also helps promoting better memory. ?5. Avoid stressful situations. Live a simple life and avoid aggravations. Organize your time and prepare for the next day in anticipation. ?6. Sleep well, avoid any interruptions of sleep and avoid any distractions in the bedroom that may interfere with adequate sleep quality ?7. Avoid sugar, avoid sweets as there is a strong link between excessive sugar intake, diabetes, and cognitive impairment ?We discussed the Mediterranean diet, which has been shown to help patients reduce the risk of progressive memory disorders and reduces cardiovascular risk. This includes eating fish, eat fruits and green leafy vegetables, nuts like almonds and hazelnuts, walnuts, and also use olive oil. Avoid fast foods and fried foods as much as possible. Avoid sweets and sugar as sugar use has been linked to worsening of memory function. ? ?There is always a concern of gradual progression of memory problems. If this is the case, then we may need to adjust level of care according to patient needs. Support, both to the patient and caregiver, should then be put into place.  ? ? ? ? ?FALL PRECAUTIONS: Be cautious when walking. Scan the area for obstacles that may increase the risk of trips and falls. When getting up in the  mornings, sit up at the edge of the bed for a few minutes before getting out of bed. Consider elevating the bed at the head end to avoid drop of blood pressure when getting up. Walk always in a well-lit room (use night lights in the walls). Avoid area rugs or power cords from appliances in the middle of the walkways. Use a walker or a cane if necessary and consider physical therapy for balance exercise. Get your eyesight checked regularly. ?  ? ?HOME SAFETY: Consider the safety of the kitchen when operating appliances like stoves, microwave oven, and blender. Consider having supervision and share cooking responsibilities until no longer able to participate in those. Accidents with firearms and other hazards in the house should be identified and addressed as well. ? ? ?ABILITY TO BE LEFT ALONE: If patient is unable to contact 911 operator, consider using [facility], or when the need is there, arrange for someone to stay with patients. Smoking is a fire hazard, consider supervision or cessation. Risk of wandering should be assessed by caregiver and if detected at any point, supervision and safe proof recommendations should be instituted. ? ?MEDICATION SUPERVISION: Inability to self-administer medication needs to be constantly addressed. Implement a mechanism to ensure safe administration of the medications. ? ? ? ? ? ? ?  ?

## 2021-12-03 NOTE — Progress Notes (Signed)
? ?Assessment/Plan:  ? ? ?Moderate dementia with behavioral disturbance  ? ?Discussed safety both in and out of the home.  Recommend that the patient use a walker for ambulation to prevent falls ?Continue to monitor mood by PCP ?Continue donepezil  5 mg daily Side effects  were discussed, which include nausea, vomiting, diarrhea, vivid dreams, and muscle cramps.   ?Continue Depakote 250 mg twice daily, Risperdal as prescribed ?Follow up in 6 months for closer monitoring ? ? ?Case discussed with Dr. Tomi Likens who agrees with the plan ? ? ? ?Subjective:  ? ?Kristen Bowman is an 88 year old right-handed woman with a history of hypertension, hyperlipidemia, diabetes, PMR, and with moderate dementia with behavioral disturbance seen today in follow up for memory loss.  She was last evaluated on 06/04/21. She continues to live in [place], but is in the process of moving to a memory care facility.  She is currently on Donepezil '5mg'$  daily, Depakote DR '250mg'$  bid, and Risperdal '1mg'$  qhs and clonazepam as needed.  This patient is accompanied in the office by her family member who supplements the history.  Previous records as well as any outside records available were reviewed prior to todays visit.  Her memory is overall decreased since her last visit. At [facility], she participates in some activities such as recently, when she was in [employer].  She states that she has friends there and watch the [place] together..  She also likes to visit her family members in [place].  No hallucinations or paranoia.  She sleeps better from prior visit.  Denies vivid dreams or sleepwalking.  She uses a wheelchair, but sometimes she pushes it, propelling it to increase her level of activity.  She also uses a walker to ambulate.  Last fall was in December 2022, when she was washing her feet in the sink, fell back and hit her head, without loss of consciousness and with negative CT of the head.  She needs assistance with  bathing and dressing up.  Her appetite is good, denies trouble swallowing.  She admits to not drinking enough water, she enjoys soft drinks instead.  She denies headaches, trauma, injuries to the head, double vision, dizziness, focal numbness or tingling, unilateral weakness.  She is incontinent of urine and wears a diaper.  Denies constipation or diarrhea. ? ? ?History on Initial Assessment 10/28/2019: This is an 88 year old right-handed woman with a history of hypertension, hyperlipidemia, diabetes, PMR, presenting for evaluation of memory loss. She lives with her son Kristen Bowman. Her daughter Kristen Bowman lives next door. She feels her memory is not good, she does not remember a lot. Kristen Bowman started noticing changes in 2019 while they were at an [employer] in [place] [employer]. Two days after they got home, she started saying that he had not taken her to the beach in a couple of years. He feels there was a big drastic change since then. He has been living with her for the past 12 years, making sure she eats and administering her insulin, but saying she was always pretty self-sufficient. In October 2020, he found her outside in the backyard, very confused, with a flipflop on one foot and a boot in another foot. She was hallucinating, thinking she was in [facility]. Since then, symptoms progressed that he felt she needed IVC last 10/16/19. She was wandering outside, talking to her deceased father. Prior to this, she went missing earlier in the week. She was not sleeping. There is no  prior psychiatric history except for panic attacks in the 1990s. She has been taking clonazepam qhs for several years. This past weekend she would repeatedly say she is not home and she has to get out of there. They had to change the locks in the house to keep her inside. On Tuesday night she went outside with her daughter, she was out from 2pm until she got tired at 8pm. She states she still cooks, Kristen Bowman fixes meals. Kristen Bowman reports some paranoia. She  stopped driving in September 2020. Her paternal aunt and father had memory issues. No significant head injuries or alcohol use. She denies any headaches, dizziness, diplopia, dysarthria/dysphagia, neck pain, focal numbness/tingling/weakness, no falls. She has occasional urinary incontinence. She needs assistance with bathing, getting confused with the water and temperature.  ?  ?I personally reviewed head CT without contrast done 09/2019 which did not show any acute changes. There was moderate atrophy and mild chronic microvascular disease.  ? ?Most recent MRI brain without contrast 09/09/20 No evidence of acute intracranial abnormality.  No acute infarct . Moderate generalized cerebral atrophy ? ?CT head without Contrast 01/02/21 Mild atrophy with slight periventricular small vessel disease. No acute infarct. No mass or hemorrhage. There are foci of arterial vascular calcification. Small retention cyst in left ethmoid sinus noted. ? ? CT head 08/22/21 No acute intracranial abnormalities. 2. Chronic small vessel ischemic change and brain atrophy. 3. No evidence for cervical spine fracture. 4. Cervical degenerative disc disease ? ?PREVIOUS MEDICATIONS: none ? ?CURRENT MEDICATIONS:  ?Outpatient Encounter Medications as of 12/03/2021  ?Medication Sig  ? acetaminophen (TYLENOL) 325 MG tablet Take 650 mg by mouth every 4 (four) hours as needed for moderate pain.  ? amLODipine (NORVASC) 2.5 MG tablet Take 2.5 mg by mouth daily.  ? atorvastatin (LIPITOR) 40 MG tablet Take 40 mg by mouth daily.  ? clonazePAM (KLONOPIN) 0.5 MG tablet Take 0.25 mg by mouth at bedtime as needed for anxiety.  ? clonazePAM (KLONOPIN) 0.5 MG tablet Take 0.25 mg by mouth in the morning.  ? cyanocobalamin (,VITAMIN B-12,) 1000 MCG/ML injection Inject 1,000 mcg into the skin every Friday.  ? divalproex (DEPAKOTE) 250 MG DR tablet Take 1 tablet (250 mg total) by mouth 2 (two) times daily.  ? donepezil (ARICEPT) 5 MG tablet Take 5 mg by mouth at bedtime.   ? insulin lispro (HUMALOG) 100 UNIT/ML KwikPen Inject 7 Units into the skin 3 (three) times daily before meals.  ? Insulin NPH, Human,, Isophane, (HUMULIN N KWIKPEN) 100 UNIT/ML Kiwkpen Inject 8-15 Units into the skin See admin instructions. Inject 15 units subcutaneously in the morning and inject 8 units subcutaneously at bedtime.  ? losartan (COZAAR) 100 MG tablet Take 100 mg by mouth daily.   ? metFORMIN (GLUCOPHAGE-XR) 500 MG 24 hr tablet Take 500 mg by mouth daily.   ? metoprolol tartrate (LOPRESSOR) 50 MG tablet Take 50 mg by mouth 2 (two) times daily.  ? nitrofurantoin, macrocrystal-monohydrate, (MACROBID) 100 MG capsule Take 1 capsule (100 mg total) by mouth 2 (two) times daily.  ? potassium chloride SA (KLOR-CON) 20 MEQ tablet Take 1 tablet (20 mEq total) by mouth daily.  ? risperiDONE (RISPERDAL) 1 MG tablet Take 0.5 tablets (0.5 mg total) by mouth at bedtime.  ? ?No facility-administered encounter medications on file as of 12/03/2021.  ? ? ? ?Objective:  ?  ? ?PHYSICAL EXAMINATION:   ? ?VITALS:   ?Vitals:  ? 12/03/21 1423  ?BP: (!) 184/84  ?Pulse: 79  ?Resp:  20  ?SpO2: 98%  ?Weight: 120 lb (54.4 kg)  ?Height: '5\' 8"'$  (1.727 m)  ? ? ? ? ?GEN:  The patient appears stated age and is in NAD. ?HEENT:  Normocephalic, atraumatic.  ? ?Neurological examination: ? ?General: NAD, mildly disheveled, appears stated age. ?Orientation: The patient is alert. Oriented to person, not to place or date     ?Cranial nerves: There is good facial symmetry.The speech is not fluent, but clear. Voice is soft . No aphasia or dysarthria.  Fund of knowledge is reduced. Recent and remote memory are impaired. Attention and concentration are reduced.  Unable to name objects and repeat phrases.  Hearing is intact to conversational tone.    ?Sensation: Sensation is intact to light touch throughout ?Motor: Strength is at least antigravity x4. ? ?   ? View : No data to display.  ?  ?  ?  ?  ? ? ?  01/29/2021  ?  4:00 PM 06/09/2020  ?  3:00 PM   ?MMSE - Mini Mental State Exam  ?Orientation to time 0 3  ?Orientation to Place 3 5  ?Registration 1 3  ?Attention/ Calculation 0 5  ?Recall  1  ?Language- name 2 objects  2  ?Language- repeat  1  ?Language- fo

## 2021-12-21 DIAGNOSIS — F01B3 Vascular dementia, moderate, with mood disturbance: Secondary | ICD-10-CM | POA: Diagnosis not present

## 2022-01-16 DIAGNOSIS — R300 Dysuria: Secondary | ICD-10-CM | POA: Diagnosis not present

## 2022-03-15 DIAGNOSIS — F01B3 Vascular dementia, moderate, with mood disturbance: Secondary | ICD-10-CM | POA: Diagnosis not present

## 2022-05-27 DIAGNOSIS — M79671 Pain in right foot: Secondary | ICD-10-CM | POA: Diagnosis not present

## 2022-05-27 DIAGNOSIS — L602 Onychogryphosis: Secondary | ICD-10-CM | POA: Diagnosis not present

## 2022-05-27 DIAGNOSIS — M79672 Pain in left foot: Secondary | ICD-10-CM | POA: Diagnosis not present

## 2022-05-27 DIAGNOSIS — E119 Type 2 diabetes mellitus without complications: Secondary | ICD-10-CM | POA: Diagnosis not present

## 2022-05-27 DIAGNOSIS — L84 Corns and callosities: Secondary | ICD-10-CM | POA: Diagnosis not present

## 2022-05-30 ENCOUNTER — Telehealth: Payer: Self-pay

## 2022-05-30 NOTE — Patient Outreach (Signed)
  Care Coordination   Initial Visit Note   05/30/2022 Name: Kristen Bowman MRN: [id number] DOB: [birth year]  Kristen Bowman is a 88 y.o. year old female who sees No primary care provider on file. for primary care. I spoke with  Kristen Bowman' daughter Marcelene Butte by phone today.  What matters to the patients health and wellness today?  No Concerns Expressed/Patient currently residing at [facility].   Goals Addressed   None     SDOH assessments and interventions completed:  No     Care Coordination Interventions Activated:  No  Care Coordination Interventions:  No, not indicated   Follow up plan: No further intervention required.   Encounter Outcome:  Pt. Visit Completed   [facility] [phone]

## 2022-06-04 ENCOUNTER — Ambulatory Visit: Payer: Medicare Other | Admitting: Physician Assistant

## 2022-06-07 DIAGNOSIS — F01B3 Vascular dementia, moderate, with mood disturbance: Secondary | ICD-10-CM | POA: Diagnosis not present

## 2022-06-23 DIAGNOSIS — Z7984 Long term (current) use of oral hypoglycemic drugs: Secondary | ICD-10-CM | POA: Diagnosis not present

## 2022-06-23 DIAGNOSIS — E1165 Type 2 diabetes mellitus with hyperglycemia: Secondary | ICD-10-CM | POA: Diagnosis not present

## 2022-06-23 DIAGNOSIS — Z743 Need for continuous supervision: Secondary | ICD-10-CM | POA: Diagnosis not present

## 2022-06-23 DIAGNOSIS — Z794 Long term (current) use of insulin: Secondary | ICD-10-CM | POA: Diagnosis not present

## 2022-06-23 DIAGNOSIS — I440 Atrioventricular block, first degree: Secondary | ICD-10-CM | POA: Diagnosis not present

## 2022-06-23 DIAGNOSIS — Z79899 Other long term (current) drug therapy: Secondary | ICD-10-CM | POA: Diagnosis not present

## 2022-06-23 DIAGNOSIS — I4510 Unspecified right bundle-branch block: Secondary | ICD-10-CM | POA: Diagnosis not present

## 2022-06-23 DIAGNOSIS — R6889 Other general symptoms and signs: Secondary | ICD-10-CM | POA: Diagnosis not present

## 2022-06-23 DIAGNOSIS — R739 Hyperglycemia, unspecified: Secondary | ICD-10-CM | POA: Diagnosis not present

## 2022-06-23 DIAGNOSIS — N3090 Cystitis, unspecified without hematuria: Secondary | ICD-10-CM | POA: Diagnosis not present

## 2022-06-24 DIAGNOSIS — R404 Transient alteration of awareness: Secondary | ICD-10-CM | POA: Diagnosis not present

## 2022-06-24 DIAGNOSIS — Z743 Need for continuous supervision: Secondary | ICD-10-CM | POA: Diagnosis not present

## 2022-06-24 DIAGNOSIS — E878 Other disorders of electrolyte and fluid balance, not elsewhere classified: Secondary | ICD-10-CM | POA: Diagnosis not present

## 2022-06-24 DIAGNOSIS — Z7401 Bed confinement status: Secondary | ICD-10-CM | POA: Diagnosis not present

## 2022-06-24 DIAGNOSIS — R6889 Other general symptoms and signs: Secondary | ICD-10-CM | POA: Diagnosis not present

## 2022-06-24 DIAGNOSIS — R739 Hyperglycemia, unspecified: Secondary | ICD-10-CM | POA: Diagnosis not present

## 2022-06-24 DIAGNOSIS — I440 Atrioventricular block, first degree: Secondary | ICD-10-CM | POA: Diagnosis not present

## 2022-06-24 DIAGNOSIS — I4510 Unspecified right bundle-branch block: Secondary | ICD-10-CM | POA: Diagnosis not present

## 2022-07-10 ENCOUNTER — Encounter: Payer: Self-pay | Admitting: Physician Assistant

## 2022-07-10 ENCOUNTER — Ambulatory Visit (INDEPENDENT_AMBULATORY_CARE_PROVIDER_SITE_OTHER): Payer: Medicare Other | Admitting: Physician Assistant

## 2022-07-10 VITALS — BP 159/69 | HR 78 | Resp 18 | Ht 68.0 in | Wt 126.0 lb

## 2022-07-10 DIAGNOSIS — F0390 Unspecified dementia without behavioral disturbance: Secondary | ICD-10-CM

## 2022-07-10 NOTE — Patient Instructions (Signed)
It was a pleasure to see you today at our office.   Recommendations:  Follow up in 6 months Continue Donepezil 5 mg daily Continue Depakote 125 mg at night  Continue other medications as per your primary doctor   RECOMMENDATIONS FOR ALL PATIENTS WITH MEMORY PROBLEMS: 1. Continue to exercise (Recommend 30 minutes of walking everyday, or 3 hours every week) 2. Increase social interactions - continue going to [place] and enjoy social gatherings with friends and family 3. Eat healthy, avoid fried foods and eat more fruits and vegetables 4. Maintain adequate blood pressure, blood sugar, and blood cholesterol level. Reducing the risk of stroke and cardiovascular disease also helps promoting better memory. 5. Avoid stressful situations. Live a simple life and avoid aggravations. Organize your time and prepare for the next day in anticipation. 6. Sleep well, avoid any interruptions of sleep and avoid any distractions in the bedroom that may interfere with adequate sleep quality 7. Avoid sugar, avoid sweets as there is a strong link between excessive sugar intake, diabetes, and cognitive impairment We discussed the Mediterranean diet, which has been shown to help patients reduce the risk of progressive memory disorders and reduces cardiovascular risk. This includes eating fish, eat fruits and green leafy vegetables, nuts like almonds and hazelnuts, walnuts, and also use olive oil. Avoid fast foods and fried foods as much as possible. Avoid sweets and sugar as sugar use has been linked to worsening of memory function.  There is always a concern of gradual progression of memory problems. If this is the case, then we may need to adjust level of care according to patient needs. Support, both to the patient and caregiver, should then be put into place.      FALL PRECAUTIONS: Be cautious when walking. Scan the area for obstacles that may increase the risk of trips and falls. When getting up in the mornings,  sit up at the edge of the bed for a few minutes before getting out of bed. Consider elevating the bed at the head end to avoid drop of blood pressure when getting up. Walk always in a well-lit room (use night lights in the walls). Avoid area rugs or power cords from appliances in the middle of the walkways. Use a walker or a cane if necessary and consider physical therapy for balance exercise. Get your eyesight checked regularly.    HOME SAFETY: Consider the safety of the kitchen when operating appliances like stoves, microwave oven, and blender. Consider having supervision and share cooking responsibilities until no longer able to participate in those. Accidents with firearms and other hazards in the house should be identified and addressed as well.   ABILITY TO BE LEFT ALONE: If patient is unable to contact 911 operator, consider using LifeLine, or when the need is there, arrange for someone to stay with patients. Smoking is a fire hazard, consider supervision or cessation. Risk of wandering should be assessed by caregiver and if detected at any point, supervision and safe proof recommendations should be instituted.  MEDICATION SUPERVISION: Inability to self-administer medication needs to be constantly addressed. Implement a mechanism to ensure safe administration of the medications.

## 2022-07-10 NOTE — Progress Notes (Signed)
Assessment/Plan:    Dementia without behavioral disturbance Kristen Bowman is a very pleasant 88 y.o. RH female with a history of hypertension, hyperlipidemia, diabetes, PMR, seen today in follow up for memory loss. Patient is currently on donepezil 5 mg daily. She is also on Depakote, now reduced to 125 mg at night . MMSE today is 2/30. She is stable from the cognitive standpoint.    Follow up in   months. Continue donepezil 5 mg daily Continue Depakote125 mg nightly  for mood control Continue good control of cardiovascular risk factors     Subjective:    This patient is accompanied in the office by her caregiver who supplements the history.  Previous records as well as any outside records available were reviewed prior to todays visit.  Patient was last seen on 12/03/2021   Any changes in memory since last visit?  She participates in her memory care facility  at some of the activities, and also likes to visit her family members in [place]. She plays Bingo when she is in the mood. She likes to listen to music at the lounge/living room of [place]. She has a "click" of friends. She remembers only names of people she knows. She may not remember recent conversations  repeats oneself? Denies  Disoriented when walking into a room?  Patient denies   Leaving objects in unusual places?  Patient denies   Ambulates  with difficulty?  She pushes her wheelchair to help her ambulate, but her mobility has improved .  She likes to play beach ball.  Recent falls?  Patient denies, "none in the last 12 months"   Any head injuries?  Patient denies   History of seizures?   Patient denies   Wandering behavior?  Patient denies   Patient drives?   Patient no longer drives  Any mood changes since last visit? Improved. She is "always smiling" Any worsening depression?:  Patient denies   Hallucinations?  Patient denies   Paranoia?  Patient denies   Patient reports that sleeps well without vivid dreams,  REM behavior or sleepwalking "She does sleep good"  History of sleep apnea?  Patient denies   Any hygiene concerns?  She has to be reminded.  Independent of bathing and dressing?  She needs assistance  Does the patient needs help with medications? Facility  in charge  Who is in charge of the finances?  Family  is in charge   Any changes in appetite?  Patient denies   Patient have trouble swallowing? Patient denies   Does the patient cook?  They bake as a group with the coordinator Any kitchen accidents such as leaving the stove on? Patient denies   Any headaches?  Patient denies   Double vision? Patient denies   Any focal numbness or tingling?  Patient denies   Chronic back pain Patient denies   Unilateral weakness?  Patient denies   Any tremors?  Not worse than prior Any history of anosmia?  Patient denies   Any incontinence of urine?  She is incontinent of urine and wears a diaper Any bowel dysfunction?   Patient denies      Patient lives with: Lives at [facility]    History on Initial Assessment 10/28/2019: This is an 88 year old right-handed woman with a history of hypertension, hyperlipidemia, diabetes, PMR, presenting for evaluation of memory loss. She lives with her son Kristen Bowman. Her daughter Kristen Bowman lives next door. She feels her memory is not good, she does  not remember a lot. Kristen Bowman started noticing changes in 2019 while they were at an [employer] in [place]. Two days after they got home, she started saying that he had not taken her to the beach in a couple of years. He feels there was a big drastic change since then. He has been living with her for the past 12 years, making sure she eats and administering her insulin, but saying she was always pretty self-sufficient. In October 2020, he found her outside in the backyard, very confused, with a flipflop on one foot and a boot in another foot. She was hallucinating, thinking she was in [facility]. Since then, symptoms progressed that he  felt she needed IVC last 10/16/19. She was wandering outside, talking to her deceased father. Prior to this, she went missing earlier in the week. She was not sleeping. There is no prior psychiatric history except for panic attacks in the 1990s. She has been taking clonazepam qhs for several years. This past weekend she would repeatedly say she is not home and she has to get out of there. They had to change the locks in the house to keep her inside. On Tuesday night she went outside with her daughter, she was out from 2pm until she got tired at 8pm. She states she still cooks, Kristen Bowman fixes meals. Kristen Bowman reports some paranoia. She stopped driving in September 2020. Her paternal aunt and father had memory issues. No significant head injuries or alcohol use. She denies any headaches, dizziness, diplopia, dysarthria/dysphagia, neck pain, focal numbness/tingling/weakness, no falls. She has occasional urinary incontinence. She needs assistance with bathing, getting confused with the water and temperature.    I personally reviewed head CT without contrast done 09/2019 which did not show any acute changes. There was moderate atrophy and mild chronic microvascular disease.    Most recent MRI brain without contrast 09/09/20 No evidence of acute intracranial abnormality.  No acute infarct . Moderate generalized cerebral atrophy   CT head without Contrast 01/02/21 Mild atrophy with slight periventricular small vessel disease. No acute infarct. No mass or hemorrhage. There are foci of arterial vascular calcification. Small retention cyst in left ethmoid sinus noted.    CT head 08/22/21 No acute intracranial abnormalities. 2. Chronic small vessel ischemic change and brain atrophy. 3. No evidence for cervical spine fracture. 4. Cervical degenerative disc disease    PREVIOUS MEDICATIONS:   CURRENT MEDICATIONS:  Outpatient Encounter Medications as of 07/10/2022  Medication Sig   acetaminophen (TYLENOL) 325 MG tablet Take 650  mg by mouth every 4 (four) hours as needed for moderate pain.   amLODipine (NORVASC) 2.5 MG tablet Take 2.5 mg by mouth daily.   atorvastatin (LIPITOR) 40 MG tablet Take 40 mg by mouth daily.   clonazePAM (KLONOPIN) 0.5 MG tablet Take 0.25 mg by mouth at bedtime as needed for anxiety.   clonazePAM (KLONOPIN) 0.5 MG tablet Take 0.25 mg by mouth in the morning.   cyanocobalamin (,VITAMIN B-12,) 1000 MCG/ML injection Inject 1,000 mcg into the skin every Friday.   divalproex (DEPAKOTE) 250 MG DR tablet Take 1 tablet (250 mg total) by mouth 2 (two) times daily.   donepezil (ARICEPT) 5 MG tablet Take 5 mg by mouth at bedtime.   insulin lispro (HUMALOG) 100 UNIT/ML KwikPen Inject 7 Units into the skin 3 (three) times daily before meals.   Insulin NPH, Human,, Isophane, (HUMULIN N KWIKPEN) 100 UNIT/ML Kiwkpen Inject 8-15 Units into the skin See admin instructions. Inject 15 units subcutaneously  in the morning and inject 8 units subcutaneously at bedtime.   losartan (COZAAR) 100 MG tablet Take 100 mg by mouth daily.    metFORMIN (GLUCOPHAGE-XR) 500 MG 24 hr tablet Take 500 mg by mouth daily.    metoprolol tartrate (LOPRESSOR) 50 MG tablet Take 50 mg by mouth 2 (two) times daily.   nitrofurantoin, macrocrystal-monohydrate, (MACROBID) 100 MG capsule Take 1 capsule (100 mg total) by mouth 2 (two) times daily.   potassium chloride SA (KLOR-CON) 20 MEQ tablet Take 1 tablet (20 mEq total) by mouth daily.   risperiDONE (RISPERDAL) 1 MG tablet Take 0.5 tablets (0.5 mg total) by mouth at bedtime.   No facility-administered encounter medications on file as of 07/10/2022.       07/10/2022    2:00 PM 01/29/2021    4:00 PM 06/09/2020    3:00 PM  MMSE - Mini Mental State Exam  Orientation to time 2 0 3  Orientation to Place '5 3 5  '$ Registration '3 1 3  '$ Attention/ Calculation 5 0 5  Recall 2  1  Language- name 2 objects 2  2  Language- repeat 1  1  Language- follow 3 step command 1  2  Language- read & follow  direction 1  1  Write a sentence 0  1  Copy design 0  1  Total score 22  25       No data to display          Objective:     PHYSICAL EXAMINATION:    VITALS:   Vitals:   07/10/22 1426  BP: (!) 159/69  Pulse: 78  Resp: 18  SpO2: 96%  Weight: 126 lb (57.2 kg)  Height: '5\' 8"'$  (1.727 m)    GEN:  The patient appears stated age and is in NAD.    HEENT:  Normocephalic, atraumatic.   Neurological examination:  General: NAD, well-groomed, appears stated age. Orientation: The patient is alert. Oriented to person, place and date Cranial nerves: There is good facial symmetry.The speech is not fluent but clear.  Voice is soft.  No aphasia or dysarthria. Fund of knowledge is reduced. Recent and remote memory are impaired. Attention and concentration are reduced.  Able to name objects and repeat phrases.  Hearing is intact to conversational tone.    Sensation: Sensation is intact to light touch throughout Motor: Strength is at least antigravity x4. Tremors: none  DTR's 2/4 in UE/LE     Movement examination: Tone: There is normal tone in the UE/LE Abnormal movements: Right greater than left tremor, with right greater than left cogwheel rigidity.  No myoclonus.  No asterixis.   Coordination:  There is mild decremation with RAM's, normal finger to nose Gait and Station: The patient has some  difficulty arising out of a deep-seated chair without the use of the hands.  Gait is cautious and narrow, stride is shorter.     Thank you for allowing [place] the opportunity to participate in the care of this nice patient. Please do not hesitate to contact us for any questions or concerns.   Total time spent on today's visit was 25 minutes dedicated to this patient today, preparing to see patient, examining the patient, ordering tests and/or medications and counseling the patient, documenting clinical information in the EHR or other health record, independently interpreting results and communicating  results to the patient/family, discussing treatment and goals, answering patient's questions and coordinating care.  Cc:  Roetta Sessions, NP  Sharene Butters  07/10/2022 2:53 PM

## 2022-08-28 DIAGNOSIS — I69398 Other sequelae of cerebral infarction: Secondary | ICD-10-CM | POA: Diagnosis not present

## 2022-08-28 DIAGNOSIS — G4701 Insomnia due to medical condition: Secondary | ICD-10-CM | POA: Diagnosis not present

## 2022-08-28 DIAGNOSIS — F01B3 Vascular dementia, moderate, with mood disturbance: Secondary | ICD-10-CM | POA: Diagnosis not present

## 2022-09-05 DIAGNOSIS — E1151 Type 2 diabetes mellitus with diabetic peripheral angiopathy without gangrene: Secondary | ICD-10-CM | POA: Diagnosis not present

## 2022-09-05 DIAGNOSIS — I10 Essential (primary) hypertension: Secondary | ICD-10-CM | POA: Diagnosis not present

## 2022-11-08 DIAGNOSIS — I10 Essential (primary) hypertension: Secondary | ICD-10-CM | POA: Diagnosis not present

## 2022-12-11 ENCOUNTER — Other Ambulatory Visit (HOSPITAL_COMMUNITY): Payer: Self-pay

## 2022-12-11 MED ORDER — INSULIN GLARGINE-YFGN 100 UNIT/ML ~~LOC~~ SOPN
9.0000 [IU] | PEN_INJECTOR | Freq: Every evening | SUBCUTANEOUS | 2 refills | Status: DC
Start: 2022-12-11 — End: 2023-02-05
  Filled 2022-12-11: qty 3, 15d supply, fill #0
  Filled 2023-01-02: qty 3, 15d supply, fill #1
  Filled 2023-01-17: qty 3, 15d supply, fill #2

## 2022-12-13 ENCOUNTER — Other Ambulatory Visit (HOSPITAL_COMMUNITY): Payer: Self-pay

## 2022-12-25 ENCOUNTER — Other Ambulatory Visit (HOSPITAL_COMMUNITY): Payer: Self-pay

## 2022-12-25 MED ORDER — INSULIN LISPRO (1 UNIT DIAL) 100 UNIT/ML (KWIKPEN)
PEN_INJECTOR | SUBCUTANEOUS | 3 refills | Status: DC
  Filled 2022-12-25 – 2022-12-26 (×2): qty 15, 50d supply, fill #0

## 2022-12-26 ENCOUNTER — Other Ambulatory Visit (HOSPITAL_COMMUNITY): Payer: Self-pay

## 2022-12-27 ENCOUNTER — Other Ambulatory Visit (HOSPITAL_COMMUNITY): Payer: Self-pay

## 2023-01-02 ENCOUNTER — Other Ambulatory Visit (HOSPITAL_COMMUNITY): Payer: Self-pay

## 2023-01-17 ENCOUNTER — Other Ambulatory Visit (HOSPITAL_COMMUNITY): Payer: Self-pay

## 2023-01-22 ENCOUNTER — Ambulatory Visit: Payer: Medicare Other | Admitting: Physician Assistant

## 2023-01-22 ENCOUNTER — Encounter: Payer: Self-pay | Admitting: Physician Assistant

## 2023-01-22 DIAGNOSIS — Z029 Encounter for administrative examinations, unspecified: Secondary | ICD-10-CM

## 2023-02-05 ENCOUNTER — Other Ambulatory Visit (HOSPITAL_COMMUNITY): Payer: Self-pay

## 2023-02-05 MED ORDER — INSULIN GLARGINE-YFGN 100 UNIT/ML ~~LOC~~ SOPN
15.0000 [IU] | PEN_INJECTOR | Freq: Every evening | SUBCUTANEOUS | 2 refills | Status: DC
  Filled 2023-02-05: qty 3, 15d supply, fill #0
  Filled 2023-02-05: qty 15, 100d supply, fill #0

## 2023-02-13 ENCOUNTER — Telehealth: Payer: Self-pay

## 2023-02-13 NOTE — Telephone Encounter (Signed)
(  3:14 pm) PC SW left a message for patient's daughter requesting a call back to discuss palliative care services.

## 2023-02-14 ENCOUNTER — Telehealth: Payer: Self-pay | Admitting: Physician Assistant

## 2023-02-14 ENCOUNTER — Other Ambulatory Visit: Payer: Self-pay | Admitting: Physician Assistant

## 2023-02-14 MED ORDER — DIVALPROEX SODIUM 250 MG PO DR TAB: 250.0000 mg | DELAYED_RELEASE_TABLET | Freq: Two times a day (BID) | ORAL | 11 refills | Status: DC

## 2023-02-14 NOTE — Telephone Encounter (Signed)
Pts daughter is calling in stating that the pt will be out of Rx divalproex (DEPAKOTE) 250 MG DR on today and would like to see if it can be sent to Pharm: [facility] on [address], [place], [place]. Pts daughter would like to have a call once it has been called in.

## 2023-02-17 ENCOUNTER — Ambulatory Visit: Payer: Medicare Other | Admitting: Physician Assistant

## 2023-02-17 ENCOUNTER — Encounter: Payer: Self-pay | Admitting: Physician Assistant

## 2023-02-17 VITALS — BP 134/74 | HR 74 | Resp 20 | Ht 68.0 in | Wt 154.0 lb

## 2023-02-17 DIAGNOSIS — F03B18 Unspecified dementia, moderate, with other behavioral disturbance: Secondary | ICD-10-CM | POA: Diagnosis not present

## 2023-02-17 MED ORDER — DIVALPROEX SODIUM 250 MG PO DR TAB: 250.0000 mg | DELAYED_RELEASE_TABLET | Freq: Every evening | ORAL | 3 refills | Status: DC

## 2023-02-17 NOTE — Progress Notes (Signed)
Assessment/Plan:   Dementia with behavioral disturbance, likely due to Alzheimer's Disease  Kristen Bowman is a very pleasant 89 y.o. RH femalewith a history of hypertension, hyperlipidemia, diabetes, PMR and a history of Dementia with behavioral disturbance, seen today in follow up for memory loss. Patient is currently on donepezil 5 mg daily. MMSE today shows cognitive decline, at 12/30. She is no longer at [facility] due to financial hardships. She is now living with her daughter. She needs assistance with ADLs due to decreased mobility. Mood/ Sundowning is controlled with Depakote 125 mg at night.     Follow up in  6 months Continue donepezil recommend increasing to 10 mg daily Continue Depakote 125 mg nightly . Recommend good control of her cardiovascular risk factors     Subjective:    This patient is accompanied in the office by her caregiver who supplements the history.  Previous records as well as any outside records available were reviewed prior to todays visit. Patient was last seen on 07/10/2022 with an MMSE of 22/30    Any changes in memory since last visit? "Not much memory changes".   She now lives with her daughter. She likes to visit her family members in [place].  Not very active during the day,  likes to watch the [employer]. She only remembers names of people that she knows.  She may not remember recent conversations.  repeats oneself?  Endorsed Disoriented when walking into a room?  "Sometimes".     Leaving objects in unusual places?  May misplace things but not in unusual places   Wandering behavior?  denies   Any personality changes since last visit?  denies   Any worsening depression?:  denies, but she did lose  a couple of friends from the [facility] , they recently died.   Hallucinations or paranoia?   "but she won't tell you if she did" Seizures? denies    Any sleep changes?   Sleeps "like a rock' .Denies vivid dreams, REM behavior or  sleepwalking   Sleep apnea?   denies   Any hygiene concerns?  "She has to be reminded " Independent of bathing and dressing?  She does need assistance. Does the patient needs help with medications? Daughter is in charge   Who is in charge of the finances?  Family is in charge     Any changes in appetite?  denies     Patient have trouble swallowing? denies   Does the patient cook? No Any headaches?   denies   Chronic back pain  denies   Ambulates with difficulty?  She pushes the wheelchair to help her ambulate.   Recent falls or head injuries? denies     Unilateral weakness, numbness or tingling? denies   Any tremors?  She has known right greater than left hand tremors, not worse than prior Any anosmia?  Patient denies   Any incontinence of urine?  Endorsed. Wears diapers Any bowel dysfunction?   denies      Patient no longer at [place], she now at her home, with he daughter.   Does the patient drive? No longer drives       History on Initial Assessment 10/28/2019: This is an 89 year old right-handed woman with a history of hypertension, hyperlipidemia, diabetes, PMR, presenting for evaluation of memory loss. She lives with her son Kristen Bowman. Her daughter Kristen Bowman lives next door. She feels her memory is not good, she does not remember a lot.  Kristen Bowman started noticing changes in 2019 while they were at an [employer] in [place]. Two days after they got home, she started saying that he had not taken her to the beach in a couple of years. He feels there was a big drastic change since then. He has been living with her for the past 12 years, making sure she eats and administering her insulin, but saying she was always pretty self-sufficient. In October 2020, he found her outside in the backyard, very confused, with a flipflop on one foot and a boot in another foot. She was hallucinating, thinking she was in [facility]. Since then, symptoms progressed that he felt she needed IVC last 10/16/19. She was  wandering outside, talking to her deceased father. Prior to this, she went missing earlier in the week. She was not sleeping. There is no prior psychiatric history except for panic attacks in the 1990s. She has been taking clonazepam qhs for several years. This past weekend she would repeatedly say she is not home and she has to get out of there. They had to change the locks in the house to keep her inside. On Tuesday night she went outside with her daughter, she was out from 2pm until she got tired at 8pm. She states she still cooks, Kristen Bowman fixes meals. Kristen Bowman reports some paranoia. She stopped driving in September 2130. Her paternal aunt and father had memory issues. No significant head injuries or alcohol use. She denies any headaches, dizziness, diplopia, dysarthria/dysphagia, neck pain, focal numbness/tingling/weakness, no falls. She has occasional urinary incontinence. She needs assistance with bathing, getting confused with the water and temperature.    I personally reviewed head CT without contrast done 09/2019 which did not show any acute changes. There was moderate atrophy and mild chronic microvascular disease.    Most recent MRI brain without contrast 09/09/20 No evidence of acute intracranial abnormality.  No acute infarct . Moderate generalized cerebral atrophy   CT head without Contrast 01/02/21 Mild atrophy with slight periventricular small vessel disease. No acute infarct. No mass or hemorrhage. There are foci of arterial vascular calcification. Small retention cyst in left ethmoid sinus noted.    CT head 08/22/21 No acute intracranial abnormalities. 2. Chronic small vessel ischemic change and brain atrophy. 3. No evidence for cervical spine fracture. 4. Cervical degenerative disc disease   PREVIOUS MEDICATIONS:   CURRENT MEDICATIONS:  Outpatient Encounter Medications as of 02/17/2023  Medication Sig   acetaminophen (TYLENOL) 325 MG tablet Take 650 mg by mouth every 4 (four) hours as needed  for moderate pain.   amLODipine (NORVASC) 2.5 MG tablet Take 2.5 mg by mouth daily.   atorvastatin (LIPITOR) 40 MG tablet Take 40 mg by mouth daily.   clonazePAM (KLONOPIN) 0.5 MG tablet Take 0.25 mg by mouth at bedtime as needed for anxiety.   clonazePAM (KLONOPIN) 0.5 MG tablet Take 0.25 mg by mouth in the morning.   cyanocobalamin (,VITAMIN B-12,) 1000 MCG/ML injection Inject 1,000 mcg into the skin every Friday.   divalproex (DEPAKOTE) 250 MG DR tablet Take 1 tablet (250 mg total) by mouth at bedtime.   donepezil (ARICEPT) 5 MG tablet Take 5 mg by mouth at bedtime.   insulin glargine-yfgn (SEMGLEE, YFGN,) 100 UNIT/ML Pen Inject 15 Units into the skin every night   insulin lispro (HUMALOG KWIKPEN) 100 UNIT/ML KwikPen Use as directed sliding scale: Blood sugar 0 - 200 give 0 units, Blood sugar 200-250 give 2 units, blood sugar 250-300 give 4 units, blood  sugar 300-350 give 6 units, blood sugar 350-400 give 8 units, blood sugar over 400 give 10 units.   insulin lispro (HUMALOG) 100 UNIT/ML KwikPen Inject 7 Units into the skin 3 (three) times daily before meals.   Insulin NPH, Human,, Isophane, (HUMULIN N KWIKPEN) 100 UNIT/ML Kiwkpen Inject 8-15 Units into the skin See admin instructions. Inject 15 units subcutaneously in the morning and inject 8 units subcutaneously at bedtime.   losartan (COZAAR) 100 MG tablet Take 100 mg by mouth daily.    metFORMIN (GLUCOPHAGE-XR) 500 MG 24 hr tablet Take 500 mg by mouth daily.    metoprolol tartrate (LOPRESSOR) 50 MG tablet Take 50 mg by mouth 2 (two) times daily.   nitrofurantoin, macrocrystal-monohydrate, (MACROBID) 100 MG capsule Take 1 capsule (100 mg total) by mouth 2 (two) times daily.   potassium chloride SA (KLOR-CON) 20 MEQ tablet Take 1 tablet (20 mEq total) by mouth daily.   risperiDONE (RISPERDAL) 1 MG tablet Take 0.5 tablets (0.5 mg total) by mouth at bedtime.   [DISCONTINUED] divalproex (DEPAKOTE) 250 MG DR tablet Take 1 tablet (250 mg total) by  mouth 2 (two) times daily.   No facility-administered encounter medications on file as of 02/17/2023.       02/17/2023    4:00 PM 07/10/2022    2:00 PM 01/29/2021    4:00 PM  MMSE - Mini Mental State Exam  Orientation to time 1 2 0  Orientation to Place 2 5 3   Registration 3 3 1   Attention/ Calculation 3 5 0  Recall 0 2   Language- name 2 objects 1 2   Language- repeat 0 1   Language- follow 3 step command 1 1   Language- read & follow direction 1 1   Write a sentence 0 0   Copy design 0 0   Total score 12 22        No data to display          Objective:     PHYSICAL EXAMINATION:    VITALS:   Vitals:   02/17/23 1512  BP: 134/74  Pulse: 74  Resp: 20  SpO2: 95%  Weight: 154 lb (69.9 kg)  Height: 5\' 8"  (1.727 m)    GEN:  The patient appears stated age and is in NAD. HEENT:  Normocephalic, atraumatic.   Neurological examination:  General: NAD, well-groomed, appears stated age. Orientation: The patient is alert. Oriented to person, place and not to date Cranial nerves: There is good facial symmetry.The speech is fluent and clear. No aphasia or dysarthria. Fund of knowledge is reduced. Recent and remote memory are impaired. Attention and concentration are reduced.  Able to name objects 1/2 and repeat phrases.  Hearing is intact to conversational tone.   Sensation: Sensation is intact to light touch throughout Motor: Strength is at least antigravity x4. DTR's 2/4 in UE/LE     Movement examination: Tone: There is normal tone in the UE/LE Abnormal movements: Right greater than left tremor in her hands, with right greater than left cogwheel rigidity.  No myoclonus.  No asterixis.   Coordination:  There is mild decremation with RAM's. Abnormal finger to nose  Gait and Station: The patient is on a wheelchair, gait not tested.     Thank you for allowing [place] the opportunity to participate in the care of this nice patient. Please do not hesitate to contact us for any  questions or concerns.   Total time spent on today's visit was 23 minutes  dedicated to this patient today, preparing to see patient, examining the patient, ordering tests and/or medications and counseling the patient, documenting clinical information in the EHR or other health record, independently interpreting results and communicating results to the patient/family, discussing treatment and goals, answering patient's questions and coordinating care.  Cc:  Charlane Ferretti, DO  [facility] 02/17/2023 4:28 PM

## 2023-02-17 NOTE — Patient Instructions (Signed)
It was a pleasure to see you today at our office.   Recommendations:  Follow up in 6 months Continue Donepezil but consider increasing to 10  mg daily Continue Depakote 125 mg at night  Continue other medications as per your primary doctor   RECOMMENDATIONS FOR ALL PATIENTS WITH MEMORY PROBLEMS: 1. Continue to exercise (Recommend 30 minutes of walking everyday, or 3 hours every week) 2. Increase social interactions - continue going to [place] and enjoy social gatherings with friends and family 3. Eat healthy, avoid fried foods and eat more fruits and vegetables 4. Maintain adequate blood pressure, blood sugar, and blood cholesterol level. Reducing the risk of stroke and cardiovascular disease also helps promoting better memory. 5. Avoid stressful situations. Live a simple life and avoid aggravations. Organize your time and prepare for the next day in anticipation. 6. Sleep well, avoid any interruptions of sleep and avoid any distractions in the bedroom that may interfere with adequate sleep quality 7. Avoid sugar, avoid sweets as there is a strong link between excessive sugar intake, diabetes, and cognitive impairment We discussed the Mediterranean diet, which has been shown to help patients reduce the risk of progressive memory disorders and reduces cardiovascular risk. This includes eating fish, eat fruits and green leafy vegetables, nuts like almonds and hazelnuts, walnuts, and also use olive oil. Avoid fast foods and fried foods as much as possible. Avoid sweets and sugar as sugar use has been linked to worsening of memory function.  There is always a concern of gradual progression of memory problems. If this is the case, then we may need to adjust level of care according to patient needs. Support, both to the patient and caregiver, should then be put into place.      FALL PRECAUTIONS: Be cautious when walking. Scan the area for obstacles that may increase the risk of trips and falls. When  getting up in the mornings, sit up at the edge of the bed for a few minutes before getting out of bed. Consider elevating the bed at the head end to avoid drop of blood pressure when getting up. Walk always in a well-lit room (use night lights in the walls). Avoid area rugs or power cords from appliances in the middle of the walkways. Use a walker or a cane if necessary and consider physical therapy for balance exercise. Get your eyesight checked regularly.    HOME SAFETY: Consider the safety of the kitchen when operating appliances like stoves, microwave oven, and blender. Consider having supervision and share cooking responsibilities until no longer able to participate in those. Accidents with firearms and other hazards in the house should be identified and addressed as well.   ABILITY TO BE LEFT ALONE: If patient is unable to contact 911 operator, consider using LifeLine, or when the need is there, arrange for someone to stay with patients. Smoking is a fire hazard, consider supervision or cessation. Risk of wandering should be assessed by caregiver and if detected at any point, supervision and safe proof recommendations should be instituted.  MEDICATION SUPERVISION: Inability to self-administer medication needs to be constantly addressed. Implement a mechanism to ensure safe administration of the medications.

## 2023-04-01 ENCOUNTER — Telehealth: Payer: Self-pay | Admitting: Neurology

## 2023-04-01 ENCOUNTER — Other Ambulatory Visit: Payer: Self-pay | Admitting: Physician Assistant

## 2023-04-01 MED ORDER — DONEPEZIL HCL 5 MG PO TABS: 5.0000 mg | ORAL_TABLET | Freq: Every day | ORAL | 3 refills | Status: DC

## 2023-04-01 NOTE — Telephone Encounter (Addendum)
Patient is needing refill on DonePezil 5mg   Sent in Error to Dr. Karel Jarvis . Suppose to be PA ,Gwynneth Munson, [facility]

## 2023-05-09 ENCOUNTER — Other Ambulatory Visit: Payer: Self-pay | Admitting: Physician Assistant

## 2023-05-09 ENCOUNTER — Telehealth: Payer: Self-pay | Admitting: Physician Assistant

## 2023-05-09 MED ORDER — DIVALPROEX SODIUM 250 MG PO DR TAB: 250.0000 mg | DELAYED_RELEASE_TABLET | Freq: Two times a day (BID) | ORAL | 3 refills | Status: DC

## 2023-05-09 NOTE — Telephone Encounter (Signed)
Pt's daughter called in stating the pt has gotten pretty bad with her sun downers. She says it seems to be all day now. She stated [facility] Dec had mentioned she could take some extra medication, but she cannot remember the details of what that was.

## 2023-08-25 ENCOUNTER — Encounter: Payer: Self-pay | Admitting: Physician Assistant

## 2023-08-25 ENCOUNTER — Ambulatory Visit: Payer: Medicare Other | Admitting: Physician Assistant

## 2023-08-25 VITALS — BP 162/68 | HR 68 | Wt 164.0 lb

## 2023-08-25 DIAGNOSIS — F03B18 Unspecified dementia, moderate, with other behavioral disturbance: Secondary | ICD-10-CM | POA: Diagnosis not present

## 2023-08-25 MED ORDER — DIVALPROEX SODIUM 250 MG PO DR TAB: 250.0000 mg | DELAYED_RELEASE_TABLET | Freq: Every evening | ORAL | 11 refills | Status: DC

## 2023-08-25 MED ORDER — DONEPEZIL HCL 5 MG PO TABS: 5.0000 mg | ORAL_TABLET | Freq: Every day | ORAL | 3 refills | Status: DC

## 2023-08-25 NOTE — Progress Notes (Signed)
Assessment/Plan:   Dementia with behavioral disturbance likely due to Alzheimer's disease  Bellany A Trindle is a very pleasant 89 y.o. RH female with a history of hypertension, hyperlipidemia, diabetes, PMR and a history of Dementia with behavioral disturbance seen today in follow up for memory loss. Patient is currently on donepezil 5 mg daily and for sundownings she is on  Depakote 125 mg to 250 mg at night. She needs assistance with her ADLs. During the day her mood is good.Unable to perform MMSE.    Follow up in 6  months. Continue donepezil 5 mg daily. Side effects were discussed  Continue Depakote 125  to 250 mg nighty for sundowning, hallucinations side effects discussed.  Recommend good control of her cardiovascular risk factors, informed of elevated BP readings.  Continue to control mood as per PCP     Subjective:    This patient is accompanied in the office by her granddaughter  who supplements the history.  Previous records as well as any outside records available were reviewed prior to todays visit. Patient was last seen on 02/17/23 with MMSE 12/30     Any changes in memory since last visit? "More of a day to day basis"-granddaughter says.  She is not very active during the day She likes to visit her family members in [place], watch the [employer] channel.  She only remembers names of people that she knows well "always recognizes me"-granddaughter says.  She does not remember recent conversations or new information. repeats oneself?  Endorsed Disoriented at home?  "Sometimes ". 70-80 percent of the time she knows where she is.  Leaving objects? Denies.   Wandering behavior?  denies   Any personality changes since last visit?  denies   Any worsening depression?:  Denies.   Hallucinations or paranoia?  Endorsed by her grand-daughter, she has sundowning episodes, but better controlled with Depakote at night  seizures? denies    Any sleep changes? Sleeps well . Denies nightmares,   REM behavior or sleepwalking   Sleep apnea?   Denies.   Any hygiene concerns?  As before, she has to have assistance. She knows that she did not take a shower in several days. "She waits for commands to do something"-granddaughter says.   Independent of bathing and dressing? She needs assistance. Does the patient needs help with medications?  Daughter is in charge   Who is in charge of the finances? Family  is in charge     Any changes in appetite?  denies . She "loves to eat". She may not remember that she ate and she eats again.   Patient have trouble swallowing? She does great with me" granddaughter says, but when her daughter does it "she has more problems". She takes pills with applesauce  Does the patient cook? No  Any headaches?   denies   Chronic back pain  denies   Ambulates with difficulty? She walks "ok, maybe a little difficulty getting up from the chair". Since her daughter  and granddaughter are involved, she ambulates more frequently with assistance, less time on the wheelchair.  Recent falls or head injuries? denies     Unilateral weakness, numbness or tingling? denies   Any tremors? Denies Any anosmia?  Denies   Any incontinence of urine?  Endorsed, wears Depends Any bowel dysfunction?  Sometimes she has diarrhea, but granddaughter disagrees, because she does not see her having any lose stools, "I think she is on a schedule"     Patient lives  with her daughter (unable to afford [facility], had to leave the facility early in 2024)    Does the patient drive? No longer drives        History on Initial Assessment 10/28/2019: This is an 89 year old right-handed woman with a history of hypertension, hyperlipidemia, diabetes, PMR, presenting for evaluation of memory loss. She lives with her son [name]. Her daughter [name] lives next door. She feels her memory is not good, she does not remember a lot. [name] started noticing changes in 2019 while they were at an [employer] in  [place]. Two days after they got home, she started saying that he had not taken her to the beach in a couple of years. He feels there was a big drastic change since then. He has been living with her for the past 12 years, making sure she eats and administering her insulin, but saying she was always pretty self-sufficient. In October 2020, he found her outside in the backyard, very confused, with a flipflop on one foot and a boot in another foot. She was hallucinating, thinking she was in [facility]. Since then, symptoms progressed that he felt she needed IVC last 10/16/19. She was wandering outside, talking to her deceased father. Prior to this, she went missing earlier in the week. She was not sleeping. There is no prior psychiatric history except for panic attacks in the 1990s. She has been taking clonazepam qhs for several years. This past weekend she would repeatedly say she is not home and she has to get out of there. They had to change the locks in the house to keep her inside. On Tuesday night she went outside with her daughter, she was out from 2pm until she got tired at 8pm. She states she still cooks, [name] fixes meals. [name] reports some paranoia. She stopped driving in September 1610. Her paternal aunt and father had memory issues. No significant head injuries or alcohol use. She denies any headaches, dizziness, diplopia, dysarthria/dysphagia, neck pain, focal numbness/tingling/weakness, no falls. She has occasional urinary incontinence. She needs assistance with bathing, getting confused with the water and temperature.    I personally reviewed head CT without contrast done 09/2019 which did not show any acute changes. There was moderate atrophy and mild chronic microvascular disease.    Most recent MRI brain without contrast 09/09/20 No evidence of acute intracranial abnormality.  No acute infarct . Moderate generalized cerebral atrophy   CT head without Contrast 01/02/21 Mild atrophy with slight  periventricular small vessel disease. No acute infarct. No mass or hemorrhage. There are foci of arterial vascular calcification. Small retention cyst in left ethmoid sinus noted.    CT head 08/22/21 No acute intracranial abnormalities. 2. Chronic small vessel ischemic change and brain atrophy. 3. No evidence for cervical spine fracture. 4. Cervical degenerative disc disease  PREVIOUS MEDICATIONS:   CURRENT MEDICATIONS:  Outpatient Encounter Medications as of 08/25/2023  Medication Sig   acetaminophen (TYLENOL) 325 MG tablet Take 650 mg by mouth every 4 (four) hours as needed for moderate pain.   amLODipine (NORVASC) 2.5 MG tablet Take 2.5 mg by mouth daily.   atorvastatin (LIPITOR) 40 MG tablet Take 40 mg by mouth daily.   clonazePAM (KLONOPIN) 0.5 MG tablet Take 0.25 mg by mouth at bedtime as needed for anxiety.   clonazePAM (KLONOPIN) 0.5 MG tablet Take 0.25 mg by mouth in the morning.   cyanocobalamin (,VITAMIN B-12,) 1000 MCG/ML injection Inject 1,000 mcg into the skin every Friday.  divalproex (DEPAKOTE) 250 MG DR tablet Take 1 tablet (250 mg total) by mouth at bedtime.   donepezil (ARICEPT) 5 MG tablet Take 1 tablet (5 mg total) by mouth at bedtime.   insulin glargine-yfgn (SEMGLEE, YFGN,) 100 UNIT/ML Pen Inject 15 Units into the skin every night   insulin lispro (HUMALOG KWIKPEN) 100 UNIT/ML KwikPen Use as directed sliding scale: Blood sugar 0 - 200 give 0 units, Blood sugar 200-250 give 2 units, blood sugar 250-300 give 4 units, blood sugar 300-350 give 6 units, blood sugar 350-400 give 8 units, blood sugar over 400 give 10 units.   insulin lispro (HUMALOG) 100 UNIT/ML KwikPen Inject 7 Units into the skin 3 (three) times daily before meals.   Insulin NPH, Human,, Isophane, (HUMULIN N KWIKPEN) 100 UNIT/ML Kiwkpen Inject 8-15 Units into the skin See admin instructions. Inject 15 units subcutaneously in the morning and inject 8 units subcutaneously at bedtime.   losartan (COZAAR) 100 MG  tablet Take 100 mg by mouth daily.    metFORMIN (GLUCOPHAGE-XR) 500 MG 24 hr tablet Take 500 mg by mouth daily.    metoprolol tartrate (LOPRESSOR) 50 MG tablet Take 50 mg by mouth 2 (two) times daily.   nitrofurantoin, macrocrystal-monohydrate, (MACROBID) 100 MG capsule Take 1 capsule (100 mg total) by mouth 2 (two) times daily.   potassium chloride SA (KLOR-CON) 20 MEQ tablet Take 1 tablet (20 mEq total) by mouth daily.   risperiDONE (RISPERDAL) 1 MG tablet Take 0.5 tablets (0.5 mg total) by mouth at bedtime.   [DISCONTINUED] divalproex (DEPAKOTE) 250 MG DR tablet Take 1 tablet (250 mg total) by mouth 2 (two) times daily.   [DISCONTINUED] donepezil (ARICEPT) 5 MG tablet Take 1 tablet (5 mg total) by mouth at bedtime.   No facility-administered encounter medications on file as of 08/25/2023.       02/17/2023    4:00 PM 07/10/2022    2:00 PM 01/29/2021    4:00 PM  MMSE - Mini Mental State Exam  Not completed:   --  Orientation to time 1 2 0  Orientation to Place 2 5 3   Registration 3 3 1   Attention/ Calculation 3 5 0  Recall 0 2   Language- name 2 objects 1 2   Language- repeat 0 1   Language- follow 3 step command 1 1   Language- read & follow direction 1 1   Write a sentence 0 0   Copy design 0 0   Total score 12 22        No data to display          Objective:     PHYSICAL EXAMINATION:    VITALS:   Vitals:   08/25/23 1531 08/25/23 1544  BP: (!) 207/84 (!) 162/68  Pulse: 68   SpO2: 97%   Weight: 164 lb (74.4 kg)     GEN:  The patient appears stated age and is in NAD. HEENT:  Normocephalic, atraumatic.   Neurological examination:  General: NAD, well-groomed, appears stated age. Orientation: The patient is alert. No oriented to person,  place and date Cranial nerves: There is good facial symmetry.The speech is fluent and clear. No aphasia or dysarthria. Fund of knowledge is reduced. Recent and remote memory are impaired. Attention and concentration are reduced.   Unable to name objects or repeat phrases.  Hearing is intact to conversational tone.  Sensation: Sensation is intact to light touch throughout Motor: Strength is at least antigravity x4. DTR's 2/4 in UE/LE  Movement examination: Tone: There is normal tone in the UE/LE Abnormal movements: R>L hand mild intention tremor, R>L cogwheel rigidity.  No myoclonus.  No asterixis.   Coordination:  There is mild decremation with RAM's. Normal finger to nose  Gait and Station:  stride short, gait stable and narrow. Able to stand from sitting position with some difficulty    Thank you for allowing [place] the opportunity to participate in the care of this nice patient. Please do not hesitate to contact us for any questions or concerns.   Total time spent on today's visit was 35 minutes dedicated to this patient today, preparing to see patient, examining the patient, ordering tests and/or medications and counseling the patient, documenting clinical information in the EHR or other health record, independently interpreting results and communicating results to the patient/family, discussing treatment and goals, answering patient's questions and coordinating care.  Cc:  Charlane Ferretti, DO  [facility] 08/25/2023 5:20 PM

## 2023-08-25 NOTE — Patient Instructions (Signed)
It was a pleasure to see you today at our office.   Recommendations:  Follow up in 6 months Continue Donepezil 5  mg daily Continue Depakote 125 or 250  mg at night  Continue other medications as per your primary doctor   RECOMMENDATIONS FOR ALL PATIENTS WITH MEMORY PROBLEMS: 1. Continue to exercise (Recommend 30 minutes of walking everyday, or 3 hours every week) 2. Increase social interactions - continue going to [place] and enjoy social gatherings with friends and family 3. Eat healthy, avoid fried foods and eat more fruits and vegetables 4. Maintain adequate blood pressure, blood sugar, and blood cholesterol level. Reducing the risk of stroke and cardiovascular disease also helps promoting better memory. 5. Avoid stressful situations. Live a simple life and avoid aggravations. Organize your time and prepare for the next day in anticipation. 6. Sleep well, avoid any interruptions of sleep and avoid any distractions in the bedroom that may interfere with adequate sleep quality 7. Avoid sugar, avoid sweets as there is a strong link between excessive sugar intake, diabetes, and cognitive impairment We discussed the Mediterranean diet, which has been shown to help patients reduce the risk of progressive memory disorders and reduces cardiovascular risk. This includes eating fish, eat fruits and green leafy vegetables, nuts like almonds and hazelnuts, walnuts, and also use olive oil. Avoid fast foods and fried foods as much as possible. Avoid sweets and sugar as sugar use has been linked to worsening of memory function.  There is always a concern of gradual progression of memory problems. If this is the case, then we may need to adjust level of care according to patient needs. Support, both to the patient and caregiver, should then be put into place.      FALL PRECAUTIONS: Be cautious when walking. Scan the area for obstacles that may increase the risk of trips and falls. When getting up in the  mornings, sit up at the edge of the bed for a few minutes before getting out of bed. Consider elevating the bed at the head end to avoid drop of blood pressure when getting up. Walk always in a well-lit room (use night lights in the walls). Avoid area rugs or power cords from appliances in the middle of the walkways. Use a walker or a cane if necessary and consider physical therapy for balance exercise. Get your eyesight checked regularly.    HOME SAFETY: Consider the safety of the kitchen when operating appliances like stoves, microwave oven, and blender. Consider having supervision and share cooking responsibilities until no longer able to participate in those. Accidents with firearms and other hazards in the house should be identified and addressed as well.   ABILITY TO BE LEFT ALONE: If patient is unable to contact 911 operator, consider using LifeLine, or when the need is there, arrange for someone to stay with patients. Smoking is a fire hazard, consider supervision or cessation. Risk of wandering should be assessed by caregiver and if detected at any point, supervision and safe proof recommendations should be instituted.  MEDICATION SUPERVISION: Inability to self-administer medication needs to be constantly addressed. Implement a mechanism to ensure safe administration of the medications.

## 2023-12-19 ENCOUNTER — Encounter: Payer: Self-pay | Admitting: Physician Assistant

## 2024-01-30 ENCOUNTER — Other Ambulatory Visit: Payer: Self-pay | Admitting: Physician Assistant

## 2024-02-08 ENCOUNTER — Other Ambulatory Visit: Payer: Self-pay | Admitting: Physician Assistant

## 2024-02-08 NOTE — Progress Notes (Signed)
 Error

## 2024-02-13 ENCOUNTER — Telehealth: Payer: Self-pay | Admitting: Physician Assistant

## 2024-02-13 NOTE — Telephone Encounter (Signed)
 Patients daughter called to see about Kristen Bowman's medication. Dr.Skakle was supposed to reach out to our office about Depakote . Kristen Bowman can't really take pills. Can she get medication in liquid or crystals?

## 2024-02-15 ENCOUNTER — Other Ambulatory Visit: Payer: Self-pay | Admitting: Physician Assistant

## 2024-02-15 MED ORDER — DIVALPROEX SODIUM 125 MG PO CSDR: 250.0000 mg | DELAYED_RELEASE_CAPSULE | Freq: Two times a day (BID) | ORAL | 3 refills | Status: AC

## 2024-02-22 ENCOUNTER — Other Ambulatory Visit: Payer: Self-pay | Admitting: Physician Assistant

## 2024-02-23 ENCOUNTER — Ambulatory Visit: Payer: Medicare Other | Admitting: Physician Assistant

## 2024-03-05 ENCOUNTER — Ambulatory Visit: Admitting: Physician Assistant

## 2024-03-05 ENCOUNTER — Encounter: Payer: Self-pay | Admitting: Physician Assistant

## 2024-03-05 VITALS — BP 181/68 | HR 78 | Resp 20 | Wt 168.0 lb

## 2024-03-05 DIAGNOSIS — F03B18 Unspecified dementia, moderate, with other behavioral disturbance: Secondary | ICD-10-CM

## 2024-03-05 MED ORDER — DONEPEZIL HCL 5 MG PO TABS: 5.0000 mg | ORAL_TABLET | Freq: Every day | ORAL | 3 refills | Status: AC

## 2024-03-05 NOTE — Patient Instructions (Addendum)
 It was a pleasure to see you today at our office.   Recommendations:  Follow up in 6 months Continue Donepezil  5  mg daily Continue Depakote  125 or 250  mg at twice a day  Continue other medications as per your primary doctor   RECOMMENDATIONS FOR ALL PATIENTS WITH MEMORY PROBLEMS: 1. Continue to exercise (Recommend 30 minutes of walking everyday, or 3 hours every week) 2. Increase social interactions - continue going to [place] and enjoy social gatherings with friends and family 3. Eat healthy, avoid fried foods and eat more fruits and vegetables 4. Maintain adequate blood pressure, blood sugar, and blood cholesterol level. Reducing the risk of stroke and cardiovascular disease also helps promoting better memory. 5. Avoid stressful situations. Live a simple life and avoid aggravations. Organize your time and prepare for the next day in anticipation. 6. Sleep well, avoid any interruptions of sleep and avoid any distractions in the bedroom that may interfere with adequate sleep quality 7. Avoid sugar, avoid sweets as there is a strong link between excessive sugar intake, diabetes, and cognitive impairment We discussed the Mediterranean diet, which has been shown to help patients reduce the risk of progressive memory disorders and reduces cardiovascular risk. This includes eating fish, eat fruits and green leafy vegetables, nuts like almonds and hazelnuts, walnuts, and also use olive oil. Avoid fast foods and fried foods as much as possible. Avoid sweets and sugar as sugar use has been linked to worsening of memory function.  There is always a concern of gradual progression of memory problems. If this is the case, then we may need to adjust level of care according to patient needs. Support, both to the patient and caregiver, should then be put into place.      FALL PRECAUTIONS: Be cautious when walking. Scan the area for obstacles that may increase the risk of trips and falls. When getting up in  the mornings, sit up at the edge of the bed for a few minutes before getting out of bed. Consider elevating the bed at the head end to avoid drop of blood pressure when getting up. Walk always in a well-lit room (use night lights in the walls). Avoid area rugs or power cords from appliances in the middle of the walkways. Use a walker or a cane if necessary and consider physical therapy for balance exercise. Get your eyesight checked regularly.    HOME SAFETY: Consider the safety of the kitchen when operating appliances like stoves, microwave oven, and blender. Consider having supervision and share cooking responsibilities until no longer able to participate in those. Accidents with firearms and other hazards in the house should be identified and addressed as well.   ABILITY TO BE LEFT ALONE: If patient is unable to contact 911 operator, consider using LifeLine, or when the need is there, arrange for someone to stay with patients. Smoking is a fire hazard, consider supervision or cessation. Risk of wandering should be assessed by caregiver and if detected at any point, supervision and safe proof recommendations should be instituted.  MEDICATION SUPERVISION: Inability to self-administer medication needs to be constantly addressed. Implement a mechanism to ensure safe administration of the medications.

## 2024-03-05 NOTE — Progress Notes (Signed)
 Assessment/Plan:   Dementia likely due to Alzheimer's disease with behavioral disturbance    Kristen Bowman is a very pleasant 89 y.o. RH female with a history of hypertension, hyperlipidemia, diabetes, PMR and a history of Dementia with behavioral disturbance seen today in follow up for memory loss. Patient is currently on donepezil  5 mg daily.  For sundowning she is on Depakote  sprinkles 250 mg twice daily.  She needs assistance with her ADLs. Memory overall stable.     Follow up in 6  months. Continue donepezil  5 mg daily, side effects discussed  Continue Depakote  sprinkles 125-250 nightly for sundowning, hallucinations, side effects discussed. Recommend good control of her cardiovascular risk factors, inform of elevated blood pressure. Continue to control mood as per PCP    Subjective:    This patient is accompanied in the office by granddaughter  who supplements the history.  Previous records as well as any outside records available were reviewed prior to todays visit. Patient was last seen on 08/25/2023     Any changes in memory since last visit? About the same   She likes visiting her family members in [place], which the [employer] channel.  She can only remember names of people that she knows very well.  She does not remember any recent conversations or new information. She goes 2 x a week at [employer].  Granddaughter reports that she may fives before going there, stating I never go back to that place that when she gets of the car she is a different person she is very happy, enjoys herself there  repeats oneself? Less conversant.  Disoriented when walking into a room? Denies    Leaving objects?  She is not very mobile, does not misplace things  Wandering behavior?  denies   Any personality changes since last visit? She is quieter than before, although there are moments of sundowning. Any worsening depression?:  Denies.   Hallucinations or paranoia?  Denies.   Seizures?  denies    Any sleep changes? Sleeps well.  Denies vivid dreams, REM behavior or sleepwalking   Sleep apnea?   Denies.   Any hygiene concerns? Denies.  Independent of bathing and dressing?  Endorsed  Does the patient needs help with medications?  Daughter is in charge   Who is in charge of the finances? Daughter  is in charge     Any changes in appetite?  denies.  She may forget that she ate then she eats again.    Patient have trouble swallowing?  Large pills.  Recently, Depakote  was changed to sprinkles for better tolerance.  She takes pills with applesauce   Does the patient cook? No Any headaches?   denies   Any vision changes?  Denies Chronic back pain  denies   Ambulates with difficulty? Denies.    Recent falls or head injuries? Denies.     Unilateral weakness, numbness or tingling? denies   Any tremors?  Denies    Any anosmia?  Denies   Any incontinence of urine?  Endorsed, wears depends   Any bowel dysfunction?   Denies      Patient lives her daughter, she is unable to afford Fredick had to leave the facility early in 2024.  Does the patient drive? No longer drives      History on Initial Assessment 10/28/2019: This is an 89 year old right-handed woman with a history of hypertension, hyperlipidemia, diabetes, PMR, presenting for evaluation of memory loss. She lives with her son Arley.  Her daughter Kristen Bowman lives next door. She feels her memory is not good, she does not remember a lot. Arley started noticing changes in 2019 while they were at an [employer] in [place]. Two days after they got home, she started saying that he had not taken her to the beach in a couple of years. He feels there was a big drastic change since then. He has been living with her for the past 12 years, making sure she eats and administering her insulin , but saying she was always pretty self-sufficient. In October 2020, he found her outside in the backyard, very confused, with a flipflop on one foot and a  boot in another foot. She was hallucinating, thinking she was in [facility]. Since then, symptoms progressed that he felt she needed IVC last 10/16/19. She was wandering outside, talking to her deceased father. Prior to this, she went missing earlier in the week. She was not sleeping. There is no prior psychiatric history except for panic attacks in the 1990s. She has been taking clonazepam  qhs for several years. This past weekend she would repeatedly say she is not home and she has to get out of there. They had to change the locks in the house to keep her inside. On Tuesday night she went outside with her daughter, she was out from 2pm until she got tired at 8pm. She states she still cooks, Arley fixes meals. Arley reports some paranoia. She stopped driving in September 7979. Her paternal aunt and father had memory issues. No significant head injuries or alcohol use. She denies any headaches, dizziness, diplopia, dysarthria/dysphagia, neck pain, focal numbness/tingling/weakness, no falls. She has occasional urinary incontinence. She needs assistance with bathing, getting confused with the water  and temperature.    I personally reviewed head CT without contrast done 09/2019 which did not show any acute changes. There was moderate atrophy and mild chronic microvascular disease.    Most recent MRI brain without contrast 09/09/20 No evidence of acute intracranial abnormality.  No acute infarct . Moderate generalized cerebral atrophy   CT head without Contrast 01/02/21 Mild atrophy with slight periventricular small vessel disease. No acute infarct. No mass or hemorrhage. There are foci of arterial vascular calcification. Small retention cyst in left ethmoid sinus noted.    CT head 08/22/21 No acute intracranial abnormalities. 2. Chronic small vessel ischemic change and brain atrophy. 3. No evidence for cervical spine fracture. 4. Cervical degenerative disc disease    PREVIOUS MEDICATIONS:   CURRENT MEDICATIONS:   Outpatient Encounter Medications as of 03/05/2024  Medication Sig   acetaminophen  (TYLENOL ) 325 MG tablet Take 650 mg by mouth every 4 (four) hours as needed for moderate pain.   amLODipine (NORVASC) 2.5 MG tablet Take 2.5 mg by mouth daily.   atorvastatin  (LIPITOR) 40 MG tablet Take 40 mg by mouth daily.   clonazePAM  (KLONOPIN ) 0.5 MG tablet Take 0.25 mg by mouth at bedtime as needed for anxiety.   clonazePAM  (KLONOPIN ) 0.5 MG tablet Take 0.25 mg by mouth in the morning.   cyanocobalamin (,VITAMIN B-12,) 1000 MCG/ML injection Inject 1,000 mcg into the skin every Friday.   divalproex  (DEPAKOTE  SPRINKLES) 125 MG capsule Take 2 capsules (250 mg total) by mouth 2 (two) times daily.   donepezil  (ARICEPT ) 5 MG tablet Take 1 tablet (5 mg total) by mouth at bedtime.   insulin  glargine-yfgn (SEMGLEE , YFGN,) 100 UNIT/ML Pen Inject 15 Units into the skin every night   insulin  lispro (HUMALOG  KWIKPEN) 100 UNIT/ML KwikPen  Use as directed sliding scale: Blood sugar 0 - 200 give 0 units, Blood sugar 200-250 give 2 units, blood sugar 250-300 give 4 units, blood sugar 300-350 give 6 units, blood sugar 350-400 give 8 units, blood sugar over 400 give 10 units.   insulin  lispro (HUMALOG ) 100 UNIT/ML KwikPen Inject 7 Units into the skin 3 (three) times daily before meals.   Insulin  NPH, Human,, Isophane, (HUMULIN  N KWIKPEN) 100 UNIT/ML Kiwkpen Inject 8-15 Units into the skin See admin instructions. Inject 15 units subcutaneously in the morning and inject 8 units subcutaneously at bedtime.   losartan  (COZAAR ) 100 MG tablet Take 100 mg by mouth daily.    metFORMIN  (GLUCOPHAGE -XR) 500 MG 24 hr tablet Take 500 mg by mouth daily.    metoprolol  tartrate (LOPRESSOR ) 50 MG tablet Take 50 mg by mouth 2 (two) times daily.   nitrofurantoin , macrocrystal-monohydrate, (MACROBID ) 100 MG capsule Take 1 capsule (100 mg total) by mouth 2 (two) times daily.   potassium chloride  SA (KLOR-CON ) 20 MEQ tablet Take 1 tablet (20 mEq total)  by mouth daily.   risperiDONE  (RISPERDAL ) 1 MG tablet Take 0.5 tablets (0.5 mg total) by mouth at bedtime.   [DISCONTINUED] donepezil  (ARICEPT ) 5 MG tablet Take 1 tablet (5 mg total) by mouth at bedtime.   No facility-administered encounter medications on file as of 03/05/2024.       02/17/2023    4:00 PM 07/10/2022    2:00 PM 01/29/2021    4:00 PM  MMSE - Mini Mental State Exam  Not completed:   --  Orientation to time 1 2 0  Orientation to Place 2 5 3   Registration 3 3 1   Attention/ Calculation 3 5 0  Recall 0 2   Language- name 2 objects 1 2   Language- repeat 0 1   Language- follow 3 step command 1 1   Language- read & follow direction 1 1   Write a sentence 0 0   Copy design 0 0   Total score 12 22        No data to display          Objective:     PHYSICAL EXAMINATION:    VITALS:   Vitals:   03/05/24 1429  BP: (!) 181/68  Pulse: 78  Resp: 20  SpO2: 95%  Weight: 168 lb (76.2 kg)    GEN:  The patient appears stated age and is in NAD. HEENT:  Normocephalic, atraumatic.   Neurological examination:  General: NAD, well-groomed, appears stated age. Orientation: The patient is alert.  Not oriented to person, place and date Cranial nerves: There is good facial symmetry.The speech is fluent and clear, soft. No aphasia or dysarthria. Fund of knowledge is reduced. Recent and remote memory are impaired. Attention and concentration are reduced.  Unable to name objects and unable repeat phrases.  Hearing is decreased to conversational tone.   Sensation: Sensation is intact to light touch throughout Motor: Strength is at least antigravity x4. DTR's 2/4 in UE/LE     Movement examination:  Tone: There is increased tone in the UE/LE. Mild R>L cogwheel rigidity Abnormal movements: Mild R>L mild intention tremor. No myoclonus.  No asterixis.   Coordination:  There is no decremation with RAM's. Normal finger to nose  Gait and Station: The patient has some difficulty  arising out of a deep-seated chair without the use of her hands, needs a walker to ambulate.  Stride is short, gait is stable and narrow  Thank you for allowing us  the opportunity to participate in the care of this nice patient. Please do not hesitate to contact us  for any questions or concerns.   Total time spent on today's visit was 20 minutes dedicated to this patient today, preparing to see patient, examining the patient, ordering tests and/or medications and counseling the patient, documenting clinical information in the EHR or other health record, independently interpreting results and communicating results to the patient/family, discussing treatment and goals, answering patient's questions and coordinating care.  Cc:  Valentin Skates, DO  [facility] 03/05/2024 5:11 PM

## 2024-03-17 ENCOUNTER — Emergency Department (HOSPITAL_COMMUNITY)
Admission: EM | Admit: 2024-03-17 | Discharge: 2024-03-17 | Disposition: A | Discharge status: Home / Self Care | Attending: Emergency Medicine | Admitting: Emergency Medicine

## 2024-03-17 ENCOUNTER — Encounter (HOSPITAL_COMMUNITY): Payer: Self-pay

## 2024-03-17 DIAGNOSIS — Z794 Long term (current) use of insulin: Secondary | ICD-10-CM | POA: Diagnosis not present

## 2024-03-17 DIAGNOSIS — N189 Chronic kidney disease, unspecified: Secondary | ICD-10-CM | POA: Diagnosis not present

## 2024-03-17 DIAGNOSIS — F0390 Unspecified dementia without behavioral disturbance: Secondary | ICD-10-CM | POA: Insufficient documentation

## 2024-03-17 DIAGNOSIS — Z7984 Long term (current) use of oral hypoglycemic drugs: Secondary | ICD-10-CM | POA: Diagnosis not present

## 2024-03-17 DIAGNOSIS — T148XXA Other injury of unspecified body region, initial encounter: Secondary | ICD-10-CM

## 2024-03-17 DIAGNOSIS — R030 Elevated blood-pressure reading, without diagnosis of hypertension: Secondary | ICD-10-CM

## 2024-03-17 DIAGNOSIS — S8991XA Unspecified injury of right lower leg, initial encounter: Secondary | ICD-10-CM | POA: Diagnosis present

## 2024-03-17 DIAGNOSIS — W19XXXA Unspecified fall, initial encounter: Secondary | ICD-10-CM | POA: Insufficient documentation

## 2024-03-17 DIAGNOSIS — S81811A Laceration without foreign body, right lower leg, initial encounter: Secondary | ICD-10-CM | POA: Diagnosis not present

## 2024-03-17 DIAGNOSIS — Z79899 Other long term (current) drug therapy: Secondary | ICD-10-CM | POA: Insufficient documentation

## 2024-03-17 DIAGNOSIS — S81812A Laceration without foreign body, left lower leg, initial encounter: Secondary | ICD-10-CM | POA: Insufficient documentation

## 2024-03-17 DIAGNOSIS — E1122 Type 2 diabetes mellitus with diabetic chronic kidney disease: Secondary | ICD-10-CM | POA: Insufficient documentation

## 2024-03-17 MED ORDER — TETANUS-DIPHTH-ACELL PERTUSSIS 5-2.5-18.5 LF-MCG/0.5 IM SUSY
0.5000 mL | PREFILLED_SYRINGE | Freq: Once | INTRAMUSCULAR | Status: AC
Start: 2024-03-17 — End: 2024-03-17
  Administered 2024-03-17: 0.5 mL via INTRAMUSCULAR
  Filled 2024-03-17: qty 0.5

## 2024-03-17 MED ORDER — LIDOCAINE-EPINEPHRINE-TETRACAINE (LET) TOPICAL GEL
3.0000 mL | Freq: Once | TOPICAL | Status: AC
  Administered 2024-03-17: 3 mL via TOPICAL
  Filled 2024-03-17: qty 3

## 2024-03-17 NOTE — ED Provider Notes (Signed)
 [place] EMERGENCY DEPARTMENT AT [facility] Provider Note   CSN: [id number] Arrival date & time: 03/17/24  1220     Patient presents with: No chief complaint on file.   Kristen Bowman is a 89 y.o. female the past medical history of dementia, type 2 diabetes, chronic kidney disease, pulmonary hypertension and chronic peripheral edema.  The patient is not supposed to do things on her own but her daughter says that she is frequently very quiet and does not yell and apparently tried to transfer into her wheelchair and cut her legs on the equipment.  Her daughter found her with 2 large skin tears.  Her daughter noted serosanguineous drainage from the wounds.  She is unsure of her last tetanus vaccination   HPI     Prior to Admission medications   Medication Sig Start Date End Date Taking? Authorizing Provider  acetaminophen  (TYLENOL ) 325 MG tablet Take 650 mg by mouth every 4 (four) hours as needed for moderate pain.    [provider]  amLODipine (NORVASC) 2.5 MG tablet Take 2.5 mg by mouth daily. 06/02/20   [provider]  atorvastatin  (LIPITOR) 40 MG tablet Take 40 mg by mouth daily.    [provider]  clonazePAM  (KLONOPIN ) 0.5 MG tablet Take 0.25 mg by mouth at bedtime as needed for anxiety. 05/10/20   [provider]  clonazePAM  (KLONOPIN ) 0.5 MG tablet Take 0.25 mg by mouth in the morning.    [provider]  cyanocobalamin (,VITAMIN B-12,) 1000 MCG/ML injection Inject 1,000 mcg into the skin every Friday.    [provider]  divalproex  (DEPAKOTE  SPRINKLES) 125 MG capsule Take 2 capsules (250 mg total) by mouth 2 (two) times daily. 02/15/24   Wertman, Sara E, PA-C  donepezil  (ARICEPT ) 5 MG tablet Take 1 tablet (5 mg total) by mouth at bedtime. 03/05/24   Wertman, Sara E, PA-C  insulin  glargine-yfgn (SEMGLEE , YFGN,) 100 UNIT/ML Pen Inject 15 Units into the skin every night 02/05/23     insulin  lispro (HUMALOG  KWIKPEN) 100  UNIT/ML KwikPen Use as directed sliding scale: Blood sugar 0 - 200 give 0 units, Blood sugar 200-250 give 2 units, blood sugar 250-300 give 4 units, blood sugar 300-350 give 6 units, blood sugar 350-400 give 8 units, blood sugar over 400 give 10 units. 12/25/22     insulin  lispro (HUMALOG ) 100 UNIT/ML KwikPen Inject 7 Units into the skin 3 (three) times daily before meals. 07/22/20   [provider]  Insulin  NPH, Human,, Isophane, (HUMULIN  N KWIKPEN) 100 UNIT/ML Kiwkpen Inject 8-15 Units into the skin See admin instructions. Inject 15 units subcutaneously in the morning and inject 8 units subcutaneously at bedtime.    [provider]  losartan  (COZAAR ) 100 MG tablet Take 100 mg by mouth daily.  08/08/16   [provider]  metFORMIN  (GLUCOPHAGE -XR) 500 MG 24 hr tablet Take 500 mg by mouth daily.  08/14/16   [provider]  metoprolol  tartrate (LOPRESSOR ) 50 MG tablet Take 50 mg by mouth 2 (two) times daily. 08/24/20   [provider]  nitrofurantoin , macrocrystal-monohydrate, (MACROBID ) 100 MG capsule Take 1 capsule (100 mg total) by mouth 2 (two) times daily. 02/12/21   Roselyn Carlin NOVAK, MD  potassium chloride  SA (KLOR-CON ) 20 MEQ tablet Take 1 tablet (20 mEq total) by mouth daily. 01/31/21   Steinl, Kevin, MD  risperiDONE  (RISPERDAL ) 1 MG tablet Take 0.5 tablets (0.5 mg total) by mouth at bedtime. 06/04/21   Wertman,  Sara E, PA-C    Allergies: Ace inhibitors and Tape    Review of Systems  Updated Vital Signs BP (!) 220/110 (BP Location: Right Arm)   Pulse 89   Temp 99 F (37.2 C) (Oral)   Resp 20   SpO2 96%   Physical Exam Vitals and nursing note reviewed.  Constitutional:      General: She is not in acute distress.    Appearance: She is well-developed. She is not diaphoretic.  HENT:     Head: Normocephalic and atraumatic.     Right Ear: External ear normal.     Left Ear: External ear normal.     Nose: Nose normal.     Mouth/Throat:      Mouth: Mucous membranes are moist.  Eyes:     General: No scleral icterus.    Conjunctiva/sclera: Conjunctivae normal.  Cardiovascular:     Rate and Rhythm: Normal rate and regular rhythm.     Heart sounds: Normal heart sounds. No murmur heard.    No friction rub. No gallop.  Pulmonary:     Effort: Pulmonary effort is normal. No respiratory distress.     Breath sounds: Normal breath sounds.  Abdominal:     General: Bowel sounds are normal. There is no distension.     Palpations: Abdomen is soft. There is no mass.     Tenderness: There is no abdominal tenderness. There is no guarding.  Musculoskeletal:     Cervical back: Normal range of motion.  Skin:    General: Skin is warm and dry.     Comments: Bilateral lower extremity pitting edema, 2 large circular skin tears 1 on the right lateral right lower extremity and 1 on the medial left lower extremity with mild serosanguineous drainage   Neurological:     Mental Status: She is alert and oriented to person, place, and time.  Psychiatric:        Behavior: Behavior normal.     (all labs ordered are listed, but only abnormal results are displayed) Labs Reviewed - No data to display  EKG: None  Radiology: No results found.   Wound repair  Date/Time: 03/17/2024 5:56 PM  Performed by: Arloa Chroman, PA-C Authorized by: Arloa Chroman, PA-C  Consent: Verbal consent obtained Consent given by: guardian Patient identity confirmed: arm band Preparation: Patient was prepped and draped in the usual sterile fashion. Patient tolerance: patient tolerated the procedure well with no immediate complications Comments: Patient with 2 large skin tears.  Topical let gel product applied for pain relief.  The wounds were then cleaned with hypochlorous acid wash.  I applied Mepitel perforated bandages to keep the skin together, Xeroform gauze and gradient pressure wrappings.      Medications Ordered in the ED - No data to display                                   Medical Decision Making Risk Prescription drug management.   Patient here with bilateral skin tears.  These tears are not repairable with typical sutures.  The wounds were cleaned thoroughly.  Perforated dressing applied.  Wound care given.  I will order home health for the patient in the outpatient setting.  Patient's tetanus vaccine updated.  She is hypertensive and will need close outpatient follow-up with her PCP for blood pressure management.  She appears otherwise appropriate for discharge at this time  Final diagnoses:  None    ED Discharge Orders     None          Arloa Chroman, PA-C 03/17/24 1800    Patsey Lot, MD 03/17/24 2300

## 2024-03-17 NOTE — ED Notes (Addendum)
 Talked to daughter about changing pt, daughter refused because she didn't want her mother to be without clothes and she wanted to go home and bring her clothes first and wanted to be there while changing her due to her dementia and she only recognizes her daughter.

## 2024-03-17 NOTE — ED Notes (Signed)
 Pt's daughter at bedside, concerned, gave her home meds including her BP med. BP was elevated

## 2024-03-17 NOTE — ED Triage Notes (Signed)
 Pt coming from home after a fall, has skin tears around both knees, ems added dressings to both knees. Pt denies hitting her head, not on any blood thinners, no LOC. Ems stated she has had a decrease in strength now a days resulting in a fall. Pt A&Ox2, hx of dementia, and DM.

## 2024-03-17 NOTE — ED Notes (Signed)
 Daughter went home, has not returned yet.

## 2024-03-17 NOTE — ED Notes (Signed)
Pt picked up by [facility].  

## 2024-03-17 NOTE — Discharge Instructions (Signed)
 I have ordered home health care.  Please leave the gradient pressure wraps on her legs in place for the next 24 hours.  After 24 hours you may remove the dressings but leave the sticky dressings over the skin that have tiny holes in them in place.  You can clean the wounds through those bandages with gentle soap and water .  They should help to keep the skin adherent to the leg. You can place a nonstick dressing back over that which is available over-the-counter at most drugstores and is usually in a brand called Telfa or Xeroform.  Watch for any signs or symptoms of infection including fever, increased confusion or lethargy, redness and increased pain with streaking from the wounds.  If you see these please return immediately for emergency evaluation. Otherwise make sure that you are cleaning the wounds gently with nonbacterial soap and water  twice a day and changing the dressings on top until wound management comes in.

## 2024-03-17 NOTE — ED Notes (Signed)
 Pt dressing changed on both legs

## 2024-03-17 NOTE — ED Notes (Signed)
[facility] called for pt. transportation ?

## 2024-03-19 NOTE — ED Notes (Signed)
 Pulled patient to room 15 to change her due to her being soaked and smelling like old urine, with the help of a coworker. Called in Charge to see  how she was left after first shift Tech and RN left. Patient has been changed into dry brief, clean gown and complete bed change.

## 2024-03-26 ENCOUNTER — Inpatient Hospital Stay (HOSPITAL_COMMUNITY)
Admission: EM | Admit: 2024-03-26 | Discharge: 2024-04-01 | DRG: 689 | Disposition: A | Discharge status: Skilled Nursing Facility | Attending: Internal Medicine | Admitting: Internal Medicine

## 2024-03-26 ENCOUNTER — Other Ambulatory Visit: Payer: Self-pay

## 2024-03-26 ENCOUNTER — Emergency Department (HOSPITAL_COMMUNITY)

## 2024-03-26 DIAGNOSIS — F0390 Unspecified dementia without behavioral disturbance: Secondary | ICD-10-CM | POA: Diagnosis present

## 2024-03-26 DIAGNOSIS — R4182 Altered mental status, unspecified: Secondary | ICD-10-CM | POA: Diagnosis not present

## 2024-03-26 DIAGNOSIS — B9620 Unspecified Escherichia coli [E. coli] as the cause of diseases classified elsewhere: Secondary | ICD-10-CM | POA: Diagnosis present

## 2024-03-26 DIAGNOSIS — Z79899 Other long term (current) drug therapy: Secondary | ICD-10-CM

## 2024-03-26 DIAGNOSIS — I452 Bifascicular block: Secondary | ICD-10-CM | POA: Diagnosis present

## 2024-03-26 DIAGNOSIS — F0394 Unspecified dementia, unspecified severity, with anxiety: Secondary | ICD-10-CM | POA: Diagnosis present

## 2024-03-26 DIAGNOSIS — F32A Depression, unspecified: Secondary | ICD-10-CM | POA: Diagnosis present

## 2024-03-26 DIAGNOSIS — E785 Hyperlipidemia, unspecified: Secondary | ICD-10-CM | POA: Diagnosis present

## 2024-03-26 DIAGNOSIS — M353 Polymyalgia rheumatica: Secondary | ICD-10-CM | POA: Diagnosis present

## 2024-03-26 DIAGNOSIS — Z833 Family history of diabetes mellitus: Secondary | ICD-10-CM

## 2024-03-26 DIAGNOSIS — K580 Irritable bowel syndrome with diarrhea: Secondary | ICD-10-CM | POA: Diagnosis present

## 2024-03-26 DIAGNOSIS — Z9109 Other allergy status, other than to drugs and biological substances: Secondary | ICD-10-CM

## 2024-03-26 DIAGNOSIS — I10 Essential (primary) hypertension: Secondary | ICD-10-CM | POA: Diagnosis present

## 2024-03-26 DIAGNOSIS — E1165 Type 2 diabetes mellitus with hyperglycemia: Secondary | ICD-10-CM | POA: Diagnosis present

## 2024-03-26 DIAGNOSIS — E119 Type 2 diabetes mellitus without complications: Secondary | ICD-10-CM

## 2024-03-26 DIAGNOSIS — N3000 Acute cystitis without hematuria: Secondary | ICD-10-CM | POA: Diagnosis not present

## 2024-03-26 DIAGNOSIS — F0393 Unspecified dementia, unspecified severity, with mood disturbance: Secondary | ICD-10-CM | POA: Diagnosis present

## 2024-03-26 DIAGNOSIS — N390 Urinary tract infection, site not specified: Secondary | ICD-10-CM | POA: Insufficient documentation

## 2024-03-26 DIAGNOSIS — G9340 Encephalopathy, unspecified: Secondary | ICD-10-CM | POA: Diagnosis present

## 2024-03-26 DIAGNOSIS — E876 Hypokalemia: Secondary | ICD-10-CM | POA: Diagnosis present

## 2024-03-26 DIAGNOSIS — E1122 Type 2 diabetes mellitus with diabetic chronic kidney disease: Secondary | ICD-10-CM | POA: Diagnosis present

## 2024-03-26 DIAGNOSIS — M159 Polyosteoarthritis, unspecified: Secondary | ICD-10-CM | POA: Diagnosis present

## 2024-03-26 DIAGNOSIS — Z1152 Encounter for screening for COVID-19: Secondary | ICD-10-CM

## 2024-03-26 DIAGNOSIS — Z888 Allergy status to other drugs, medicaments and biological substances status: Secondary | ICD-10-CM

## 2024-03-26 DIAGNOSIS — N182 Chronic kidney disease, stage 2 (mild): Secondary | ICD-10-CM | POA: Diagnosis present

## 2024-03-26 DIAGNOSIS — Z7984 Long term (current) use of oral hypoglycemic drugs: Secondary | ICD-10-CM

## 2024-03-26 DIAGNOSIS — G9341 Metabolic encephalopathy: Secondary | ICD-10-CM | POA: Diagnosis present

## 2024-03-26 DIAGNOSIS — D509 Iron deficiency anemia, unspecified: Secondary | ICD-10-CM | POA: Diagnosis present

## 2024-03-26 DIAGNOSIS — I272 Pulmonary hypertension, unspecified: Secondary | ICD-10-CM | POA: Diagnosis present

## 2024-03-26 DIAGNOSIS — Z8249 Family history of ischemic heart disease and other diseases of the circulatory system: Secondary | ICD-10-CM

## 2024-03-26 DIAGNOSIS — F410 Panic disorder [episodic paroxysmal anxiety] without agoraphobia: Secondary | ICD-10-CM | POA: Diagnosis present

## 2024-03-26 DIAGNOSIS — R000 Tachycardia, unspecified: Secondary | ICD-10-CM | POA: Diagnosis present

## 2024-03-26 DIAGNOSIS — I129 Hypertensive chronic kidney disease with stage 1 through stage 4 chronic kidney disease, or unspecified chronic kidney disease: Secondary | ICD-10-CM | POA: Diagnosis present

## 2024-03-26 DIAGNOSIS — D631 Anemia in chronic kidney disease: Secondary | ICD-10-CM | POA: Diagnosis present

## 2024-03-26 DIAGNOSIS — Z794 Long term (current) use of insulin: Secondary | ICD-10-CM

## 2024-03-26 DIAGNOSIS — Z7982 Long term (current) use of aspirin: Secondary | ICD-10-CM

## 2024-03-26 LAB — COMPREHENSIVE METABOLIC PANEL WITH GFR
ALT: 26 U/L (ref 0–44)
AST: 32 U/L (ref 15–41)
Albumin: 2.3 g/dL — ABNORMAL LOW (ref 3.5–5.0)
Alkaline Phosphatase: 70 U/L (ref 38–126)
Anion gap: 11 (ref 5–15)
BUN: 14 mg/dL (ref 8–23)
CO2: 23 mmol/L (ref 22–32)
Calcium: 8.4 mg/dL — ABNORMAL LOW (ref 8.9–10.3)
Chloride: 102 mmol/L (ref 98–111)
Creatinine, Ser: 1.01 mg/dL — ABNORMAL HIGH (ref 0.44–1.00)
GFR, Estimated: 53 mL/min — ABNORMAL LOW (ref >60)
Glucose, Bld: 227 mg/dL — ABNORMAL HIGH (ref 70–99)
Potassium: 3.4 mmol/L — ABNORMAL LOW (ref 3.5–5.1)
Sodium: 136 mmol/L (ref 135–145)
Total Bilirubin: 1.1 mg/dL (ref 0.0–1.2)
Total Protein: 6 g/dL — ABNORMAL LOW (ref 6.5–8.1)

## 2024-03-26 LAB — URINALYSIS, W/ REFLEX TO CULTURE (INFECTION SUSPECTED)
Bilirubin Urine: NEGATIVE
Glucose, UA: NEGATIVE mg/dL
Ketones, ur: NEGATIVE mg/dL
Nitrite: NEGATIVE
Protein, ur: 100 mg/dL — AB
Specific Gravity, Urine: 1.014 (ref 1.005–1.030)
WBC, UA: >50 WBC/hpf (ref 0–5)
pH: 5 (ref 5.0–8.0)

## 2024-03-26 LAB — CBC WITH DIFFERENTIAL/PLATELET
Abs Immature Granulocytes: 0.07 K/uL (ref 0.00–0.07)
Basophils Absolute: 0 K/uL (ref 0.0–0.1)
Basophils Relative: 0 %
Eosinophils Absolute: 0 K/uL (ref 0.0–0.5)
Eosinophils Relative: 0 %
HCT: 33.3 % — ABNORMAL LOW (ref 36.0–46.0)
Hemoglobin: 10.9 g/dL — ABNORMAL LOW (ref 12.0–15.0)
Immature Granulocytes: 1 %
Lymphocytes Relative: 11 %
Lymphs Abs: 1.2 K/uL (ref 0.7–4.0)
MCH: 28.2 pg (ref 26.0–34.0)
MCHC: 32.7 g/dL (ref 30.0–36.0)
MCV: 86.3 fL (ref 80.0–100.0)
Monocytes Absolute: 1 K/uL (ref 0.1–1.0)
Monocytes Relative: 8 %
Neutro Abs: 9.2 K/uL — ABNORMAL HIGH (ref 1.7–7.7)
Neutrophils Relative %: 80 %
Platelets: 205 K/uL (ref 150–400)
RBC: 3.86 MIL/uL — ABNORMAL LOW (ref 3.87–5.11)
RDW: 13.6 % (ref 11.5–15.5)
WBC: 11.5 K/uL — ABNORMAL HIGH (ref 4.0–10.5)
nRBC: 0 % (ref 0.0–0.2)

## 2024-03-26 LAB — RESP PANEL BY RT-PCR (RSV, FLU A&B, COVID)  RVPGX2
Influenza A by PCR: NEGATIVE
Influenza B by PCR: NEGATIVE
Resp Syncytial Virus by PCR: NEGATIVE
SARS Coronavirus 2 by RT PCR: NEGATIVE

## 2024-03-26 LAB — PROTIME-INR
INR: 1.2 (ref 0.8–1.2)
Prothrombin Time: 16.1 s — ABNORMAL HIGH (ref 11.4–15.2)

## 2024-03-26 LAB — I-STAT CG4 LACTIC ACID, ED: Lactic Acid, Venous: 1 mmol/L (ref 0.5–1.9)

## 2024-03-26 MED ORDER — LACTATED RINGERS IV BOLUS (SEPSIS)
1000.0000 mL | Freq: Once | INTRAVENOUS | Status: AC
  Administered 2024-03-26: 1000 mL via INTRAVENOUS

## 2024-03-26 MED ORDER — SODIUM CHLORIDE 0.9 % IV SOLN
2.0000 g | Freq: Once | INTRAVENOUS | Status: AC
  Administered 2024-03-26: 2 g via INTRAVENOUS
  Filled 2024-03-26: qty 20

## 2024-03-26 NOTE — ED Notes (Signed)
Bg - 227

## 2024-03-26 NOTE — H&P (Addendum)
 History and Physical    Kristen Bowman FMW:[id number] DOB: 12/29/1930 DOA: 03/26/2024  Patient coming from: Home.  Chief Complaint: Increasing confusion.  History obtained from patient's daughter.  HPI: Kristen Bowman is a 89 y.o. female with history of dementia, diabetes mellitus type 2, hypertension, chronic kidney disease stage II, anemia, depression was brought to the ER after patient was feeling increasingly weak poor appetite and confused over the last 2 to 3 days.  Per daughter who provided most of the history patient usually ambulates with help and has not been doing it for last 2 to 3 days and has been been eating less.  Did not have any nausea vomiting.  Has chronic diarrhea but has not actually worsened over the last few days.  ED Course: In the ER patient was having a fever of 101 F.  UA is concerning for UTI.  CT of the head did not show anything acute.  Blood pressure was also elevated initially improved with IV hydralazine .  Cultures were sent and started on empiric antibiotics admitted for acute encephalopathy suspect likely from UTI.  Labs show anemia and mild renal failure which are chronic.  Review of Systems: As per HPI, rest all negative.   Past Medical History:  Diagnosis Date   Anemia    Anxiety states    Diabetes mellitus    T2DM with renal manifestations   Generalized osteoarthrosis, unspecified site    Hematuria    Hyperlipidemia    mixed   Hypertension    Irritable bowel syndrome    Other specified cardiac dysrhythmias(427.89)    atrial fibrilation   Panic disorder    Polymyalgia rheumatica (HCC)    Proteinuria 05/29/10   Type II or unspecified type diabetes mellitus with renal manifestations, uncontrolled(250.42) 05/29/10   CKD II (mild)    Past Surgical History:  Procedure Laterality Date   CATARACT EXTRACTION     right   COLONOSCOPY N/A 11/04/2012   Procedure: COLONOSCOPY;  Surgeon: Elsie Cree, MD;  Location: [facility];  Service: Endoscopy;   Laterality: N/A;   ESOPHAGOGASTRODUODENOSCOPY N/A 11/04/2012   Procedure: ESOPHAGOGASTRODUODENOSCOPY (EGD);  Surgeon: Elsie Cree, MD;  Location: [facility];  Service: Endoscopy;  Laterality: N/A;   TUBAL LIGATION       reports that she has never smoked. She has never used smokeless tobacco. She reports that she does not drink alcohol and does not use drugs.  Allergies  Allergen Reactions   Ace Inhibitors Shortness Of Breath and Swelling   Tape Other (See Comments)    Skin bruises and tears easily    Family History  Problem Relation Age of Onset   Diabetes Sister    Cancer Other    Heart attack Other    Emphysema Mother    Leukemia Father     Prior to Admission medications   Medication Sig Start Date End Date Taking? Authorizing Provider  amLODipine  (NORVASC ) 2.5 MG tablet Take 2.5 mg by mouth daily. 06/02/20  Yes [provider]  atorvastatin  (LIPITOR) 40 MG tablet Take 40 mg by mouth daily.   Yes [provider]  clonazePAM  (KLONOPIN ) 0.5 MG tablet Take 0.5 mg by mouth daily. 05/10/20  Yes [provider]  [facility] GENUINE ASPIRIN  325 MG tablet Take 325 mg by mouth daily. 01/02/21  Yes [provider]  [facility] VITAMIN B12 1000 MCG tablet Take 1,000 mcg by mouth daily.   Yes [provider]  divalproex  (DEPAKOTE  SPRINKLES) 125 MG capsule Take 2 capsules (  250 mg total) by mouth 2 (two) times daily. 02/15/24  Yes Wertman, Sara E, PA-C  donepezil  (ARICEPT ) 5 MG tablet Take 1 tablet (5 mg total) by mouth at bedtime. Patient taking differently: Take 5 mg by mouth every evening. 03/05/24  Yes Wertman, Sara E, PA-C  insulin  lispro (HUMALOG  KWIKPEN) 100 UNIT/ML KwikPen Use as directed sliding scale: Blood sugar 0 - 200 give 0 units, Blood sugar 200-250 give 2 units, blood sugar 250-300 give 4 units, blood sugar 300-350 give 6 units, blood sugar 350-400 give 8 units, blood sugar over 400 give 10 units. Patient taking differently: Inject 3-8 Units into the  skin 3 (three) times daily. Per sliding scale 12/25/22  Yes   LANTUS  SOLOSTAR 100 UNIT/ML Solostar Pen Inject 18 Units into the skin at bedtime.   Yes [provider]  losartan  (COZAAR ) 50 MG tablet Take 50 mg by mouth daily.   Yes [provider]  melatonin 3 MG TABS tablet Take 3 mg by mouth at bedtime.   Yes [provider]  metFORMIN  (GLUCOPHAGE ) 500 MG tablet Take 750 mg by mouth daily with breakfast.   Yes [provider]  metoprolol  tartrate (LOPRESSOR ) 50 MG tablet Take 50 mg by mouth 2 (two) times daily. 08/24/20  Yes [provider]  risperiDONE  (RISPERDAL ) 0.5 MG tablet Take 0.5 mg by mouth daily.   Yes [provider]  sertraline  (ZOLOFT ) 50 MG tablet Take 50 mg by mouth daily.   Yes [provider]  traZODone  (DESYREL ) 50 MG tablet Take 50 mg by mouth at bedtime.   Yes [provider]  levofloxacin (LEVAQUIN) 500 MG tablet Take 500 mg by mouth daily. For 7 days Patient not taking: Reported on 03/26/2024 03/24/24   [provider]    Physical Exam: Constitutional: Moderately built and nourished. Vitals:   03/26/24 2134 03/26/24 2137 03/26/24 2200 03/26/24 2230  BP:   (!) 156/49   Pulse:   77 72  Resp:   (!) 22 19  Temp: 99.8 F (37.7 C)     TempSrc: Rectal     SpO2:  96% 93% 96%   Eyes: Anicteric no pallor. ENMT: No discharge from the ears eyes nose and mouth. Neck: No mass felt.  No neck rigidity. Respiratory: No rhonchi or crepitations. Cardiovascular: S1-S2 heard. Abdomen: Soft nontender bowel sound present. Musculoskeletal: No edema. Skin: No rash. Neurologic: Patient is mildly lethargic but easily answers questions oriented to her name moving all extremities but generally weak. Psychiatric: Oriented to her name.   Labs on Admission: I have personally reviewed following labs and imaging studies  CBC: Recent Labs  Lab 03/26/24 2005  WBC 11.5*  NEUTROABS 9.2*  HGB 10.9*  HCT 33.3*   MCV 86.3  PLT 205   Basic Metabolic Panel: Recent Labs  Lab 03/26/24 2005  NA 136  K 3.4*  CL 102  CO2 23  GLUCOSE 227*  BUN 14  CREATININE 1.01*  CALCIUM  8.4*   GFR: CrCl cannot be calculated (Unknown ideal weight.). Liver Function Tests: Recent Labs  Lab 03/26/24 2005  AST 32  ALT 26  ALKPHOS 70  BILITOT 1.1  PROT 6.0*  ALBUMIN  2.3*   No results for input(s): LIPASE, AMYLASE in the last 168 hours. No results for input(s): AMMONIA in the last 168 hours. Coagulation Profile: Recent Labs  Lab 03/26/24 2005  INR 1.2   Cardiac Enzymes: No results for input(s): CKTOTAL, CKMB, CKMBINDEX, TROPONINI in the last 168 hours. BNP (last  3 results) No results for input(s): PROBNP in the last 8760 hours. HbA1C: No results for input(s): HGBA1C in the last 72 hours. CBG: No results for input(s): GLUCAP in the last 168 hours. Lipid Profile: No results for input(s): CHOL, HDL, LDLCALC, TRIG, CHOLHDL, LDLDIRECT in the last 72 hours. Thyroid  Function Tests: No results for input(s): TSH, T4TOTAL, FREET4, T3FREE, THYROIDAB in the last 72 hours. Anemia Panel: No results for input(s): VITAMINB12, FOLATE, FERRITIN, TIBC, IRON , RETICCTPCT in the last 72 hours. Urine analysis:    Component Value Date/Time   COLORURINE YELLOW 03/26/2024 2035   APPEARANCEUR TURBID (A) 03/26/2024 2035   LABSPEC 1.014 03/26/2024 2035   PHURINE 5.0 03/26/2024 2035   GLUCOSEU NEGATIVE 03/26/2024 2035   HGBUR SMALL (A) 03/26/2024 2035   BILIRUBINUR NEGATIVE 03/26/2024 2035   KETONESUR NEGATIVE 03/26/2024 2035   PROTEINUR 100 (A) 03/26/2024 2035   UROBILINOGEN 0.2 09/24/2012 2220   NITRITE NEGATIVE 03/26/2024 2035   LEUKOCYTESUR MODERATE (A) 03/26/2024 2035   Sepsis Labs: @LABRCNTIP (procalcitonin:4,lacticidven:4) ) Recent Results (from the past 240 hours)  Resp panel by RT-PCR (RSV, Flu A&B, Covid) Anterior Nasal Swab     Status: None    Collection Time: 03/26/24  8:41 PM   Specimen: Anterior Nasal Swab  Result Value Ref Range Status   SARS Coronavirus 2 by RT PCR NEGATIVE NEGATIVE Final   Influenza A by PCR NEGATIVE NEGATIVE Final   Influenza B by PCR NEGATIVE NEGATIVE Final    Comment: (NOTE) The Xpert Xpress SARS-CoV-2/FLU/RSV plus assay is intended as an aid in the diagnosis of influenza from Nasopharyngeal swab specimens and should not be used as a sole basis for treatment. Nasal washings and aspirates are unacceptable for Xpert Xpress SARS-CoV-2/FLU/RSV testing.  Fact Sheet for Patients: BloggerCourse.com  Fact Sheet for Healthcare Providers: SeriousBroker.it  This test is not yet approved or cleared by the [place]  FDA and has been authorized for detection and/or diagnosis of SARS-CoV-2 by FDA under an Emergency Use Authorization (EUA). This EUA will remain in effect (meaning this test can be used) for the duration of the COVID-19 declaration under Section 564(b)(1) of the Act, 21 U.S.C. section 360bbb-3(b)(1), unless the authorization is terminated or revoked.     Resp Syncytial Virus by PCR NEGATIVE NEGATIVE Final    Comment: (NOTE) Fact Sheet for Patients: BloggerCourse.com  Fact Sheet for Healthcare Providers: SeriousBroker.it  This test is not yet approved or cleared by the [place]  FDA and has been authorized for detection and/or diagnosis of SARS-CoV-2 by FDA under an Emergency Use Authorization (EUA). This EUA will remain in effect (meaning this test can be used) for the duration of the COVID-19 declaration under Section 564(b)(1) of the Act, 21 U.S.C. section 360bbb-3(b)(1), unless the authorization is terminated or revoked.  Performed at [facility], [address] [place], [place] [address]      Radiological Exams on Admission: CT Head Wo Contrast Result Date:  03/26/2024 EXAM: CT HEAD WITHOUT 03/26/2024 09:11:00 PM TECHNIQUE: CT of the head was performed without the administration of intravenous contrast. Automated exposure control, iterative reconstruction, and/or weight based adjustment of the mA/kV was utilized to reduce the radiation dose to as low as reasonably achievable. COMPARISON: 08/22/2021 CLINICAL HISTORY: Mental status change, unknown cause. Altered Mental Status; CT Head Wo Contrast; Mental status change, unknown cause. Pt very altered in CT, best scans possible ; See ED Notes:; Pt bib ems from home with concern for ams. Pt's family states she was at baseline  at 2200 last night. Pt has hx of dementia. Ems advised that pt has a strong smell of urine emanating from her person. FINDINGS: BRAIN AND VENTRICLES: No acute intracranial hemorrhage. No mass effect or midline shift. No extra-axial fluid collection. Gray-white differentiation is maintained. No hydrocephalus. Generalized volume loss and chronic ischemic white matter changes. ORBITS: No acute abnormality. SINUSES AND MASTOIDS: No acute abnormality. SOFT TISSUES AND SKULL: No acute skull fracture. No acute soft tissue abnormality. IMPRESSION: 1. No acute intracranial abnormality. 2. Generalized volume loss and chronic ischemic white matter changes. Electronically signed by: Franky Stanford MD 03/26/2024 09:19 PM EDT RP Workstation: [id number]    EKG: Independently reviewed.  Normal sinus rhythm IVCD.  PACs.  Assessment/Plan Principal Problem:   Acute encephalopathy Active Problems:   CKD (chronic kidney disease), stage II   Pulmonary hypertension (HCC)   Dementia without behavioral disturbance (HCC)   Diabetes mellitus type 2 in nonobese (HCC)    Acute encephalopathy likely from UTI.  Was started on ceftriaxone  will follow cultures.  Continue with gentle hydration.  However since patient is on Depakote  will check Depakote  levels and ammonia levels.  History of depression on Depakote , Risperdal   Zoloft  and trazodone .  Check Depakote  levels and ammonia levels.  And on Klonopin  for anxiety. Hypertension uncontrolled on amlodipine  metoprolol  and ARB.  Follow blood pressure trends.  Initially was uncontrolled.  Improved with as needed IV hydralazine . Diabetes mellitus type 2 uncontrolled.  No recent hemoglobin A1c.  Check hemoglobin A1c.  Patient takes Lantus  18 units but patient's daughter has decreased it to 16 units due to poor oral intake.  I have further decreased to 12 units with sliding scale coverage closely monitor. Chronic kidney disease stage II creatinine around baseline. Chronic anemia likely from renal disease follow CBC.  Check anemia panel. Dementia on Aricept . Chronic diarrhea with some worsening as per the daughter.  If there is any further episodes in the hospital we will check stool studies.   Since patient has acute encephalopathy with UTI will need close monitoring further workup and more than 2 midnight stay.   DVT prophylaxis: Lovenox . Code Status: Full code.  Initially patient's daughter said DNR but changed to full code. Family Communication: Patient's daughter. Disposition Plan: Monitored bed. Consults called: Physical therapy. Admission status: Observation.

## 2024-03-26 NOTE — ED Triage Notes (Signed)
 Pt bib ems from home with concern for ams. Pt's family states she was at baseline at 2200 last night. Pt has hx of dementia. Ems advised that pt has a strong smell of urine emanating from her person

## 2024-03-26 NOTE — ED Provider Notes (Signed)
 [place] EMERGENCY DEPARTMENT AT [facility] Provider Note   CSN: [id number] Arrival date & time: 03/26/24  1958     History  Chief Complaint  Patient presents with   Altered Mental Status    Kristen Bowman is a 89 y.o. female with dementia, type 2 diabetes, chronic kidney disease, pulmonary hypertension and chronic peripheral edema  who presents bib ems from home with concern for altered mental status. Pt's family states she was at baseline at 2200 last night. Pt has hx of dementia but is per family all from her baseline. EMS advised that pt has a strong smell of urine emanating from her person.  On arrival to the emergency department, when asked her name, patient states  yes.  She can follow some commands but cannot answer questions.  She is alert and looking around the room.  Level 5 caveat. Per chart review patient uses a wheelchair.   Past Medical History:  Diagnosis Date   Anemia    Anxiety states    Diabetes mellitus    T2DM with renal manifestations   Generalized osteoarthrosis, unspecified site    Hematuria    Hyperlipidemia    mixed   Hypertension    Irritable bowel syndrome    Other specified cardiac dysrhythmias(427.89)    atrial fibrilation   Panic disorder    Polymyalgia rheumatica (HCC)    Proteinuria 05/29/10   Type II or unspecified type diabetes mellitus with renal manifestations, uncontrolled(250.42) 05/29/10   CKD II (mild)       Home Medications Prior to Admission medications   Medication Sig Start Date End Date Taking? Authorizing Provider  acetaminophen  (TYLENOL ) 325 MG tablet Take 650 mg by mouth every 4 (four) hours as needed for moderate pain.    [provider]  amLODipine (NORVASC) 2.5 MG tablet Take 2.5 mg by mouth daily. 06/02/20   [provider]  atorvastatin  (LIPITOR) 40 MG tablet Take 40 mg by mouth daily.    [provider]  clonazePAM  (KLONOPIN ) 0.5 MG tablet Take 0.25 mg by mouth at bedtime as  needed for anxiety. 05/10/20   [provider]  clonazePAM  (KLONOPIN ) 0.5 MG tablet Take 0.25 mg by mouth in the morning.    [provider]  cyanocobalamin (,VITAMIN B-12,) 1000 MCG/ML injection Inject 1,000 mcg into the skin every Friday.    [provider]  divalproex  (DEPAKOTE  SPRINKLES) 125 MG capsule Take 2 capsules (250 mg total) by mouth 2 (two) times daily. 02/15/24   Wertman, Sara E, PA-C  donepezil  (ARICEPT ) 5 MG tablet Take 1 tablet (5 mg total) by mouth at bedtime. 03/05/24   Wertman, Sara E, PA-C  insulin  glargine-yfgn (SEMGLEE , YFGN,) 100 UNIT/ML Pen Inject 15 Units into the skin every night 02/05/23     insulin  lispro (HUMALOG  KWIKPEN) 100 UNIT/ML KwikPen Use as directed sliding scale: Blood sugar 0 - 200 give 0 units, Blood sugar 200-250 give 2 units, blood sugar 250-300 give 4 units, blood sugar 300-350 give 6 units, blood sugar 350-400 give 8 units, blood sugar over 400 give 10 units. 12/25/22     insulin  lispro (HUMALOG ) 100 UNIT/ML KwikPen Inject 7 Units into the skin 3 (three) times daily before meals. 07/22/20   [provider]  Insulin  NPH, Human,, Isophane, (HUMULIN  N KWIKPEN) 100 UNIT/ML Kiwkpen Inject 8-15 Units into the skin See admin instructions. Inject 15 units subcutaneously in the morning and inject 8 units subcutaneously at bedtime.    [provider]  losartan  (COZAAR ) 100 MG tablet Take 100 mg by mouth daily.  08/08/16   [provider]  metFORMIN  (GLUCOPHAGE -XR) 500 MG 24 hr tablet Take 500 mg by mouth daily.  08/14/16   [provider]  metoprolol  tartrate (LOPRESSOR ) 50 MG tablet Take 50 mg by mouth 2 (two) times daily. 08/24/20   [provider]  nitrofurantoin , macrocrystal-monohydrate, (MACROBID ) 100 MG capsule Take 1 capsule (100 mg total) by mouth 2 (two) times daily. 02/12/21   Roselyn Carlin NOVAK, MD  potassium chloride  SA (KLOR-CON ) 20 MEQ tablet Take 1 tablet (20 mEq total) by mouth daily.  01/31/21   Steinl, Kevin, MD  risperiDONE  (RISPERDAL ) 1 MG tablet Take 0.5 tablets (0.5 mg total) by mouth at bedtime. 06/04/21   Wertman, Sara E, PA-C      Allergies    Ace inhibitors and Tape    Review of Systems   Review of Systems A 10 point review of systems was performed and is negative unless otherwise reported in HPI.  Physical Exam Updated Vital Signs BP (!) 164/61   Pulse 89   Temp 99.8 F (37.7 C) (Rectal)   Resp 18   SpO2 96%  Physical Exam General: Normal appearing elderly female, lying in bed.  HEENT: PERRLA, Sclera anicteric, MMM, trachea midline.  Cardiology: RRR, no murmurs/rubs/gallops.   Resp: Normal respiratory rate and effort. CTAB, no wheezes, rhonchi, crackles.  Abd: Soft, non-tender, non-distended. No rebound tenderness or guarding.  GU: Deferred. MSK: No peripheral edema or signs of trauma. Extremities without deformity or TTP.  Skin: warm, dry.  Back: No apparent CVA tenderness Neuro: A&Ox0, CNs II-XII grossly intact. MAEs equally. Sensation grossly intact.  Follows some commands.  ED Results / Procedures / Treatments   Labs (all labs ordered are listed, but only abnormal results are displayed) Labs Reviewed  COMPREHENSIVE METABOLIC PANEL WITH GFR - Abnormal; Notable for the following components:      Result Value   Potassium 3.4 (*)    Glucose, Bld 227 (*)    Creatinine, Ser 1.01 (*)    Calcium  8.4 (*)    Total Protein 6.0 (*)    Albumin  2.3 (*)    GFR, Estimated 53 (*)    All other components within normal limits  CBC WITH DIFFERENTIAL/PLATELET - Abnormal; Notable for the following components:   WBC 11.5 (*)    RBC 3.86 (*)    Hemoglobin 10.9 (*)    HCT 33.3 (*)    Neutro Abs 9.2 (*)    All other components within normal limits  PROTIME-INR - Abnormal; Notable for the following components:   Prothrombin Time 16.1 (*)    All other components within normal limits  URINALYSIS, W/ REFLEX TO CULTURE (INFECTION SUSPECTED) - Abnormal; Notable  for the following components:   APPearance TURBID (*)    Hgb urine dipstick SMALL (*)    Protein, ur 100 (*)    Leukocytes,Ua MODERATE (*)    Bacteria, UA MANY (*)    All other components within normal limits  CULTURE, BLOOD (ROUTINE X 2)  CULTURE, BLOOD (ROUTINE X 2)  RESP PANEL BY RT-PCR (RSV, FLU A&B, COVID)  [id number]  I-STAT CG4 LACTIC ACID, ED  CBG MONITORING, ED  I-STAT CG4 LACTIC ACID, ED    EKG EKG Interpretation Date/Time:  Friday March 26 2024 20:43:20 EDT Ventricular Rate:  81 PR Interval:  196 QRS Duration:  143 QT Interval:  429 QTC Calculation: 498 R Axis:   172  Text Interpretation:  Sinus rhythm Atrial premature complex Nonspecific intraventricular conduction delay Confirmed by Franklyn Gills [phone]) on 03/26/2024 9:33:10 PM  Radiology CT Head Wo Contrast Result Date: 03/26/2024 EXAM: CT HEAD WITHOUT 03/26/2024 09:11:00 PM TECHNIQUE: CT of the head was performed without the administration of intravenous contrast. Automated exposure control, iterative reconstruction, and/or weight based adjustment of the mA/kV was utilized to reduce the radiation dose to as low as reasonably achievable. COMPARISON: 08/22/2021 CLINICAL HISTORY: Mental status change, unknown cause. Altered Mental Status; CT Head Wo Contrast; Mental status change, unknown cause. Pt very altered in CT, best scans possible ; See ED Notes:; Pt bib ems from home with concern for ams. Pt's family states she was at baseline at 2200 last night. Pt has hx of dementia. Ems advised that pt has a strong smell of urine emanating from her person. FINDINGS: BRAIN AND VENTRICLES: No acute intracranial hemorrhage. No mass effect or midline shift. No extra-axial fluid collection. Gray-white differentiation is maintained. No hydrocephalus. Generalized volume loss and chronic ischemic white matter changes. ORBITS: No acute abnormality. SINUSES AND MASTOIDS: No acute abnormality. SOFT TISSUES AND SKULL: No acute skull fracture. No  acute soft tissue abnormality. IMPRESSION: 1. No acute intracranial abnormality. 2. Generalized volume loss and chronic ischemic white matter changes. Electronically signed by: Franky Stanford MD 03/26/2024 09:19 PM EDT RP Workstation: [id number]    Procedures .Critical Care  Performed by: Franklyn Gills SAILOR, MD Authorized by: Franklyn Gills SAILOR, MD   Critical care provider statement:    Critical care time (minutes):  30   Critical care was necessary to treat or prevent imminent or life-threatening deterioration of the following conditions:  Sepsis   Critical care was time spent personally by me on the following activities:  Development of treatment plan with patient or surrogate, discussions with consultants, evaluation of patient's response to treatment, examination of patient, ordering and review of laboratory studies, ordering and review of radiographic studies, ordering and performing treatments and interventions, pulse oximetry, re-evaluation of patient's condition and review of old charts   Care discussed with: admitting provider       Medications Ordered in ED Medications  lactated ringers  bolus 1,000 mL (1,000 mLs Intravenous New Bag/Given 03/26/24 2113)  cefTRIAXone  (ROCEPHIN ) 2 g in sodium chloride  0.9 % 100 mL IVPB (2 g Intravenous New Bag/Given 03/26/24 2112)    ED Course/ Medical Decision Making/ A&P                          Medical Decision Making Amount and/or Complexity of Data Reviewed Labs: ordered. Radiology: ordered.  Risk Decision regarding hospitalization.    This patient presents to the ED for concern of altered mental status, this involves an extensive number of treatment options, and is a complaint that carries with it a high risk of complications and morbidity.  I considered the following differential and admission for this acute, potentially life threatening condition.   MDM:    Ddx of acute altered mental status or encephalopathy considered but not limited  to:  Patient with a grossly positive urine obtained by In-and-Out catheterization.  She also has a leukocytosis with a left shift and a fever.  Patient is SIRS positive.  Her lactate is within normal limits and her blood pressure is elevated to 182/68, no septic shock.Her creatinine is at baseline.  She has no report of any head trauma, no head trauma noted on exam, and CT head without any abnormality.  No significant electrolyte  derangements or hypo-/hyperglycemia.  EKG without any signs of ischemia. Admit to medicine.      Labs: I Ordered, and personally interpreted labs.  The pertinent results include: Those listed above  Imaging Studies ordered: I ordered imaging studies including CT head I independently visualized and interpreted imaging. I agree with the radiologist interpretation  Additional history obtained from chart review, EMS.    Cardiac Monitoring: The patient was maintained on a cardiac monitor.  I personally viewed and interpreted the cardiac monitored which showed an underlying rhythm of: Normal sinus rhythm  Reevaluation: After the interventions noted above, I reevaluated the patient and found that they have :stayed the same  Social Determinants of Health:  lives with family?  Disposition: Admit to medicine  Co morbidities that complicate the patient evaluation  Past Medical History:  Diagnosis Date   Anemia    Anxiety states    Diabetes mellitus    T2DM with renal manifestations   Generalized osteoarthrosis, unspecified site    Hematuria    Hyperlipidemia    mixed   Hypertension    Irritable bowel syndrome    Other specified cardiac dysrhythmias(427.89)    atrial fibrilation   Panic disorder    Polymyalgia rheumatica (HCC)    Proteinuria 05/29/10   Type II or unspecified type diabetes mellitus with renal manifestations, uncontrolled(250.42) 05/29/10   CKD II (mild)     Medicines Meds ordered this encounter  Medications   lactated ringers  bolus  1,000 mL    Reason 30 mL/kg dose is not being ordered:   Non-Septic Shock Clinical Presentation   cefTRIAXone  (ROCEPHIN ) 2 g in sodium chloride  0.9 % 100 mL IVPB    Antibiotic Indication::   UTI    I have reviewed the patients home medicines and have made adjustments as needed  Problem List / ED Course: Problem List Items Addressed This Visit   None Visit Diagnoses       Altered mental status, unspecified altered mental status type    -  Primary     Acute cystitis without hematuria                       This note was created using dictation software, which may contain spelling or grammatical errors.    Franklyn Sid SAILOR, MD 03/26/24 [phone]

## 2024-03-27 ENCOUNTER — Encounter (HOSPITAL_COMMUNITY): Payer: Self-pay | Admitting: Internal Medicine

## 2024-03-27 DIAGNOSIS — I272 Pulmonary hypertension, unspecified: Secondary | ICD-10-CM | POA: Diagnosis present

## 2024-03-27 DIAGNOSIS — Z1152 Encounter for screening for COVID-19: Secondary | ICD-10-CM | POA: Diagnosis not present

## 2024-03-27 DIAGNOSIS — N390 Urinary tract infection, site not specified: Secondary | ICD-10-CM | POA: Insufficient documentation

## 2024-03-27 DIAGNOSIS — E876 Hypokalemia: Secondary | ICD-10-CM | POA: Diagnosis present

## 2024-03-27 DIAGNOSIS — N3000 Acute cystitis without hematuria: Secondary | ICD-10-CM | POA: Diagnosis present

## 2024-03-27 DIAGNOSIS — R4182 Altered mental status, unspecified: Secondary | ICD-10-CM | POA: Diagnosis present

## 2024-03-27 DIAGNOSIS — E785 Hyperlipidemia, unspecified: Secondary | ICD-10-CM | POA: Diagnosis present

## 2024-03-27 DIAGNOSIS — Z833 Family history of diabetes mellitus: Secondary | ICD-10-CM | POA: Diagnosis not present

## 2024-03-27 DIAGNOSIS — Z794 Long term (current) use of insulin: Secondary | ICD-10-CM | POA: Diagnosis not present

## 2024-03-27 DIAGNOSIS — I452 Bifascicular block: Secondary | ICD-10-CM | POA: Diagnosis present

## 2024-03-27 DIAGNOSIS — D631 Anemia in chronic kidney disease: Secondary | ICD-10-CM | POA: Diagnosis present

## 2024-03-27 DIAGNOSIS — E1122 Type 2 diabetes mellitus with diabetic chronic kidney disease: Secondary | ICD-10-CM | POA: Diagnosis present

## 2024-03-27 DIAGNOSIS — M353 Polymyalgia rheumatica: Secondary | ICD-10-CM | POA: Diagnosis present

## 2024-03-27 DIAGNOSIS — Z7984 Long term (current) use of oral hypoglycemic drugs: Secondary | ICD-10-CM | POA: Diagnosis not present

## 2024-03-27 DIAGNOSIS — B9620 Unspecified Escherichia coli [E. coli] as the cause of diseases classified elsewhere: Secondary | ICD-10-CM | POA: Diagnosis present

## 2024-03-27 DIAGNOSIS — E1165 Type 2 diabetes mellitus with hyperglycemia: Secondary | ICD-10-CM | POA: Diagnosis present

## 2024-03-27 DIAGNOSIS — F410 Panic disorder [episodic paroxysmal anxiety] without agoraphobia: Secondary | ICD-10-CM | POA: Diagnosis present

## 2024-03-27 DIAGNOSIS — G9341 Metabolic encephalopathy: Secondary | ICD-10-CM | POA: Diagnosis present

## 2024-03-27 DIAGNOSIS — I129 Hypertensive chronic kidney disease with stage 1 through stage 4 chronic kidney disease, or unspecified chronic kidney disease: Secondary | ICD-10-CM | POA: Diagnosis present

## 2024-03-27 DIAGNOSIS — F0393 Unspecified dementia, unspecified severity, with mood disturbance: Secondary | ICD-10-CM | POA: Diagnosis present

## 2024-03-27 DIAGNOSIS — G9340 Encephalopathy, unspecified: Secondary | ICD-10-CM | POA: Diagnosis not present

## 2024-03-27 DIAGNOSIS — F0394 Unspecified dementia, unspecified severity, with anxiety: Secondary | ICD-10-CM | POA: Diagnosis present

## 2024-03-27 DIAGNOSIS — Z8249 Family history of ischemic heart disease and other diseases of the circulatory system: Secondary | ICD-10-CM | POA: Diagnosis not present

## 2024-03-27 DIAGNOSIS — Z79899 Other long term (current) drug therapy: Secondary | ICD-10-CM | POA: Diagnosis not present

## 2024-03-27 DIAGNOSIS — M159 Polyosteoarthritis, unspecified: Secondary | ICD-10-CM | POA: Diagnosis present

## 2024-03-27 DIAGNOSIS — N182 Chronic kidney disease, stage 2 (mild): Secondary | ICD-10-CM | POA: Diagnosis present

## 2024-03-27 DIAGNOSIS — F32A Depression, unspecified: Secondary | ICD-10-CM | POA: Diagnosis present

## 2024-03-27 LAB — CBC WITH DIFFERENTIAL/PLATELET
Abs Immature Granulocytes: 0.07 K/uL (ref 0.00–0.07)
Basophils Absolute: 0 K/uL (ref 0.0–0.1)
Basophils Relative: 0 %
Eosinophils Absolute: 0.1 K/uL (ref 0.0–0.5)
Eosinophils Relative: 1 %
HCT: 30.1 % — ABNORMAL LOW (ref 36.0–46.0)
Hemoglobin: 9.8 g/dL — ABNORMAL LOW (ref 12.0–15.0)
Immature Granulocytes: 1 %
Lymphocytes Relative: 11 %
Lymphs Abs: 1 K/uL (ref 0.7–4.0)
MCH: 28.2 pg (ref 26.0–34.0)
MCHC: 32.6 g/dL (ref 30.0–36.0)
MCV: 86.7 fL (ref 80.0–100.0)
Monocytes Absolute: 0.7 K/uL (ref 0.1–1.0)
Monocytes Relative: 9 %
Neutro Abs: 6.5 K/uL (ref 1.7–7.7)
Neutrophils Relative %: 78 %
Platelets: 181 K/uL (ref 150–400)
RBC: 3.47 MIL/uL — ABNORMAL LOW (ref 3.87–5.11)
RDW: 13.7 % (ref 11.5–15.5)
WBC: 8.3 K/uL (ref 4.0–10.5)
nRBC: 0 % (ref 0.0–0.2)

## 2024-03-27 LAB — GLUCOSE, CAPILLARY
Glucose-Capillary: 276 mg/dL — ABNORMAL HIGH (ref 70–99)
Glucose-Capillary: 217 mg/dL — ABNORMAL HIGH (ref 70–99)
Glucose-Capillary: 300 mg/dL — ABNORMAL HIGH (ref 70–99)
Glucose-Capillary: 333 mg/dL — ABNORMAL HIGH (ref 70–99)
Glucose-Capillary: 298 mg/dL — ABNORMAL HIGH (ref 70–99)

## 2024-03-27 LAB — FERRITIN: Ferritin: 144 ng/mL (ref 11–307)

## 2024-03-27 LAB — BASIC METABOLIC PANEL WITH GFR
Anion gap: 9 (ref 5–15)
BUN: 13 mg/dL (ref 8–23)
CO2: 23 mmol/L (ref 22–32)
Calcium: 8.1 mg/dL — ABNORMAL LOW (ref 8.9–10.3)
Chloride: 101 mmol/L (ref 98–111)
Creatinine, Ser: 0.84 mg/dL (ref 0.44–1.00)
GFR, Estimated: >60 mL/min
Glucose, Bld: 302 mg/dL — ABNORMAL HIGH (ref 70–99)
Potassium: 3.6 mmol/L (ref 3.5–5.1)
Sodium: 133 mmol/L — ABNORMAL LOW (ref 135–145)

## 2024-03-27 LAB — VALPROIC ACID LEVEL: Valproic Acid Lvl: 10 ug/mL — ABNORMAL LOW (ref 50–100)

## 2024-03-27 LAB — HEPATIC FUNCTION PANEL
ALT: 21 U/L (ref 0–44)
AST: 21 U/L (ref 15–41)
Albumin: 1.9 g/dL — ABNORMAL LOW (ref 3.5–5.0)
Alkaline Phosphatase: 60 U/L (ref 38–126)
Bilirubin, Direct: 0.2 mg/dL (ref 0.0–0.2)
Indirect Bilirubin: 0.2 mg/dL — ABNORMAL LOW (ref 0.3–0.9)
Total Bilirubin: 0.4 mg/dL (ref 0.0–1.2)
Total Protein: 5.4 g/dL — ABNORMAL LOW (ref 6.5–8.1)

## 2024-03-27 LAB — RETICULOCYTES
Immature Retic Fract: 15 % (ref 2.3–15.9)
RBC.: 3.45 MIL/uL — ABNORMAL LOW (ref 3.87–5.11)
Retic Count, Absolute: 53.1 K/uL (ref 19.0–186.0)
Retic Ct Pct: 1.5 % (ref 0.4–3.1)

## 2024-03-27 LAB — IRON AND TIBC
Iron: 10 ug/dL — ABNORMAL LOW (ref 28–170)
Saturation Ratios: 6 % — ABNORMAL LOW (ref 10.4–31.8)
TIBC: 172 ug/dL — ABNORMAL LOW (ref 250–450)
UIBC: 162 ug/dL

## 2024-03-27 LAB — VITAMIN B12: Vitamin B-12: 550 pg/mL (ref 180–914)

## 2024-03-27 LAB — TSH: TSH: 1.924 u[IU]/mL (ref 0.350–4.500)

## 2024-03-27 LAB — AMMONIA: Ammonia: 64 umol/L — ABNORMAL HIGH (ref 9–35)

## 2024-03-27 MED ORDER — SERTRALINE HCL 25 MG PO TABS
50.0000 mg | ORAL_TABLET | Freq: Every day | ORAL | Status: DC
  Administered 2024-03-27 – 2024-04-01 (×6): 50 mg via ORAL
  Filled 2024-03-27 (×6): qty 2

## 2024-03-27 MED ORDER — METOPROLOL TARTRATE 50 MG PO TABS
50.0000 mg | ORAL_TABLET | Freq: Two times a day (BID) | ORAL | Status: DC
  Administered 2024-03-27 – 2024-03-29 (×5): 50 mg via ORAL
  Filled 2024-03-27 (×6): qty 1

## 2024-03-27 MED ORDER — DONEPEZIL HCL 10 MG PO TABS
5.0000 mg | ORAL_TABLET | Freq: Every evening | ORAL | Status: DC
  Administered 2024-03-27 – 2024-03-31 (×5): 5 mg via ORAL
  Filled 2024-03-27 (×5): qty 1

## 2024-03-27 MED ORDER — ENOXAPARIN SODIUM 40 MG/0.4ML IJ SOSY
40.0000 mg | PREFILLED_SYRINGE | INTRAMUSCULAR | Status: DC
  Administered 2024-03-27 – 2024-03-31 (×5): 40 mg via SUBCUTANEOUS
  Filled 2024-03-27 (×5): qty 0.4

## 2024-03-27 MED ORDER — RISPERIDONE 0.5 MG PO TABS
0.5000 mg | ORAL_TABLET | Freq: Every day | ORAL | Status: DC
  Administered 2024-03-28 – 2024-03-31 (×4): 0.5 mg via ORAL
  Filled 2024-03-27 (×6): qty 1

## 2024-03-27 MED ORDER — LOSARTAN POTASSIUM 50 MG PO TABS
50.0000 mg | ORAL_TABLET | Freq: Every day | ORAL | Status: DC
  Administered 2024-03-27 – 2024-04-01 (×6): 50 mg via ORAL
  Filled 2024-03-27 (×6): qty 1

## 2024-03-27 MED ORDER — TRAZODONE HCL 50 MG PO TABS
50.0000 mg | ORAL_TABLET | Freq: Every day | ORAL | Status: DC
  Administered 2024-03-27 – 2024-03-31 (×5): 50 mg via ORAL
  Filled 2024-03-27 (×5): qty 1

## 2024-03-27 MED ORDER — POTASSIUM CHLORIDE 20 MEQ PO PACK
20.0000 meq | PACK | Freq: Once | ORAL | Status: AC
  Administered 2024-03-27: 20 meq via ORAL
  Filled 2024-03-27: qty 1

## 2024-03-27 MED ORDER — LACTATED RINGERS IV SOLN: INTRAVENOUS | Status: AC

## 2024-03-27 MED ORDER — DIVALPROEX SODIUM 125 MG PO CSDR
250.0000 mg | DELAYED_RELEASE_CAPSULE | Freq: Two times a day (BID) | ORAL | Status: DC
  Administered 2024-03-27 – 2024-04-01 (×11): 250 mg via ORAL
  Filled 2024-03-27 (×11): qty 2

## 2024-03-27 MED ORDER — AMLODIPINE BESYLATE 5 MG PO TABS
2.5000 mg | ORAL_TABLET | Freq: Every day | ORAL | Status: DC
  Administered 2024-03-27 – 2024-03-28 (×2): 2.5 mg via ORAL
  Filled 2024-03-27 (×2): qty 1

## 2024-03-27 MED ORDER — INSULIN GLARGINE-YFGN 100 UNIT/ML ~~LOC~~ SOLN
12.0000 [IU] | Freq: Every day | SUBCUTANEOUS | Status: DC
  Filled 2024-03-27: qty 0.12

## 2024-03-27 MED ORDER — ATORVASTATIN CALCIUM 40 MG PO TABS
40.0000 mg | ORAL_TABLET | Freq: Every day | ORAL | Status: DC
  Administered 2024-03-27 – 2024-04-01 (×6): 40 mg via ORAL
  Filled 2024-03-27 (×6): qty 1

## 2024-03-27 MED ORDER — SODIUM CHLORIDE 0.9 % IV SOLN
1.0000 g | INTRAVENOUS | Status: DC
  Administered 2024-03-27 – 2024-03-29 (×3): 1 g via INTRAVENOUS
  Filled 2024-03-27 (×3): qty 10

## 2024-03-27 MED ORDER — ACETAMINOPHEN 325 MG PO TABS: 650.0000 mg | ORAL_TABLET | Freq: Four times a day (QID) | ORAL | Status: DC | PRN

## 2024-03-27 MED ORDER — INSULIN ASPART 100 UNIT/ML IJ SOLN
0.0000 [IU] | Freq: Three times a day (TID) | INTRAMUSCULAR | Status: DC
  Administered 2024-03-27 (×2): 5 [IU] via SUBCUTANEOUS
  Administered 2024-03-27: 3 [IU] via SUBCUTANEOUS

## 2024-03-27 MED ORDER — MELATONIN 3 MG PO TABS
3.0000 mg | ORAL_TABLET | Freq: Every day | ORAL | Status: DC
  Administered 2024-03-27 – 2024-03-31 (×5): 3 mg via ORAL
  Filled 2024-03-27 (×5): qty 1

## 2024-03-27 MED ORDER — HYDRALAZINE HCL 20 MG/ML IJ SOLN
5.0000 mg | INTRAMUSCULAR | Status: DC | PRN
  Administered 2024-03-29: 5 mg via INTRAVENOUS
  Filled 2024-03-27: qty 1

## 2024-03-27 MED ORDER — ACETAMINOPHEN 650 MG RE SUPP
650.0000 mg | Freq: Four times a day (QID) | RECTAL | Status: DC | PRN
Start: 2024-03-27 — End: 2024-04-01

## 2024-03-27 MED ORDER — INSULIN GLARGINE-YFGN 100 UNIT/ML ~~LOC~~ SOLN
16.0000 [IU] | Freq: Every day | SUBCUTANEOUS | Status: DC
  Administered 2024-03-27: 16 [IU] via SUBCUTANEOUS
  Filled 2024-03-27 (×2): qty 0.16

## 2024-03-27 MED ORDER — CLONAZEPAM 0.5 MG PO TABS
0.5000 mg | ORAL_TABLET | Freq: Every day | ORAL | Status: DC
Start: 2024-03-27 — End: 2024-04-01
  Administered 2024-03-27 – 2024-04-01 (×6): 0.5 mg via ORAL
  Filled 2024-03-27 (×6): qty 1

## 2024-03-27 NOTE — Progress Notes (Signed)
 PROGRESS NOTE  Kristen Bowman  FMW:[id number] DOB: 03-23-1931 DOA: 03/26/2024 PCP: Valentin Skates, DO  Consultants  Brief Narrative: 89 y.o. female with history of dementia, diabetes mellitus type 2, hypertension, chronic kidney disease stage II, anemia, depression was brought to the ER after patient was feeling increasingly weak poor appetite and confused over the last 2 to 3 days.  Per daughter who provided most of the history patient usually ambulates with help and has not been doing it for last 2 to 3 days and has been been eating less.  Did not have any nausea vomiting.  Has chronic diarrhea but has not actually worsened over the last few days.   ED Course: In the ER patient was having a fever of 101 F.  UA is concerning for UTI.  CT of the head did not show anything acute.  Blood pressure was also elevated initially improved with IV hydralazine .  Cultures were sent and started on empiric antibiotics admitted for acute encephalopathy suspect likely from UTI.  Labs show anemia and mild renal failure which are chronic.   Assessment & Plan: Acute encephalopathy likely from UTI.   - Was started on ceftriaxone  will follow cultures.   - She is more awake and alert today than yesterday.  Able to work with physical therapy.  I did discuss with her daughter and she has dementia at baseline but is able to get up and walk around. - Does have fair amount of confusion at baseline   History of depression  - on Depakote , Risperdal  Zoloft  and trazodone .  Check Depakote  levels and ammonia levels.  And on Klonopin  for anxiety.  Hypertension uncontrolled  - on amlodipine  metoprolol  and ARB.   - Follow blood pressure trends.   Diabetes mellitus type 2  - uncontrolled.   - check A1c.  Patient takes Lantus  18 units but patient's daughter has decreased it to 16 units due to poor oral intake - Admitting physician can further decrease to 12 units Lantus , I have increased this back to 16 and we will trend  CBG.  Chronic kidney disease stage II creatinine around baseline.  Chronic anemia  - likely from renal disease follow CBC.   - Slowly downtrending  Dementia  - Lived in nursing home for 3 years, then came home about 1.5 years ago.  - Notable decline in overall cognition per daughter since being placed in the nursing home - on Aricept .  Chronic diarrhea  - with some worsening as per the daughter.  If there is any further episodes in the hospital we will check stool studies.         DVT prophylaxis:  enoxaparin  (LOVENOX ) injection 40 mg Start: 03/27/24 1600  Code Status:   Code Status: Full Code Family Communication: Spoke with daughter and all questions answered Level of care: Telemetry Medical Status is: Observation   Subjective: Patient sleeping but easily arousable to voice.  Able to state name and birthdate but not much else beyond that.  Objective: Vitals:   03/27/24 0115 03/27/24 0140 03/27/24 0814 03/27/24 1152  BP: (!) 169/77  (!) 176/71 (!) 152/54  Pulse: 100  87 67  Resp: 18  16 15   Temp: 99 F (37.2 C)  99.1 F (37.3 C) (!) 97.1 F (36.2 C)  TempSrc: Oral  Oral Axillary  SpO2: 96%  94% 94%  Weight:  76.1 kg    Height:  5' 6 (1.676 m)      Intake/Output Summary (Last 24 hours) at 03/27/2024  1517 Last data filed at 03/27/2024 0000 Gross per 24 hour  Intake 1100 ml  Output --  Net 1100 ml   Filed Weights   03/27/24 0140  Weight: 76.1 kg   Body mass index is 27.08 kg/m.  Gen: 89 y.o. female in no apparent distress.  Nontoxic Pulm: Non-labored breathing.  Clear to auscultation bilaterally.  CV: Regular rate and rhythm. No murmur, rub, or gallop. No JVD GI: Abdomen soft, non-tender, non-distended, with normoactive bowel sounds. No organomegaly or masses felt. Ext: Warm, no deformities Skin: No rashes, lesions  Neuro: Alert and oriented to person.  Able to squeeze both hands symmetrically.   I have personally reviewed the following labs and  images: CBC: Recent Labs  Lab 03/26/24 2005 03/27/24 0957  WBC 11.5* 8.3  NEUTROABS 9.2* 6.5  HGB 10.9* 9.8*  HCT 33.3* 30.1*  MCV 86.3 86.7  PLT 205 181   BMP &GFR Recent Labs  Lab 03/26/24 2005 03/27/24 0957  NA 136 133*  K 3.4* 3.6  CL 102 101  CO2 23 23  GLUCOSE 227* 302*  BUN 14 13  CREATININE 1.01* 0.84  CALCIUM  8.4* 8.1*   Estimated Creatinine Clearance: 46.4 mL/min (by C-G formula based on SCr of 0.84 mg/dL). Liver & Pancreas: Recent Labs  Lab 03/26/24 2005 03/27/24 0957  AST 32 21  ALT 26 21  ALKPHOS 70 60  BILITOT 1.1 0.4  PROT 6.0* 5.4*  ALBUMIN  2.3* 1.9*   No results for input(s): LIPASE, AMYLASE in the last 168 hours. Recent Labs  Lab 03/27/24 0957  AMMONIA 64*   Diabetic: No results for input(s): HGBA1C in the last 72 hours. Recent Labs  Lab 03/27/24 0818 03/27/24 1150  GLUCAP 276* 217*   Cardiac Enzymes: No results for input(s): CKTOTAL, CKMB, CKMBINDEX, TROPONINI in the last 168 hours. No results for input(s): PROBNP in the last 8760 hours. Coagulation Profile: Recent Labs  Lab 03/26/24 2005  INR 1.2   Thyroid  Function Tests: Recent Labs    03/27/24 0957  TSH 1.924   Lipid Profile: No results for input(s): CHOL, HDL, LDLCALC, TRIG, CHOLHDL, LDLDIRECT in the last 72 hours. Anemia Panel: Recent Labs    03/27/24 0957  VITAMINB12 550  FERRITIN 144  TIBC 172*  IRON  10*  RETICCTPCT 1.5   Urine analysis:    Component Value Date/Time   COLORURINE YELLOW 03/26/2024 2035   APPEARANCEUR TURBID (A) 03/26/2024 2035   LABSPEC 1.014 03/26/2024 2035   PHURINE 5.0 03/26/2024 2035   GLUCOSEU NEGATIVE 03/26/2024 2035   HGBUR SMALL (A) 03/26/2024 2035   BILIRUBINUR NEGATIVE 03/26/2024 2035   KETONESUR NEGATIVE 03/26/2024 2035   PROTEINUR 100 (A) 03/26/2024 2035   UROBILINOGEN 0.2 09/24/2012 2220   NITRITE NEGATIVE 03/26/2024 2035   LEUKOCYTESUR MODERATE (A) 03/26/2024 2035   Sepsis Labs: Invalid  input(s): PROCALCITONIN, LACTICIDVEN  Microbiology: Recent Results (from the past 240 hours)  Culture, blood (Routine x 2)     Status: None (Preliminary result)   Collection Time: 03/26/24  8:32 PM   Specimen: BLOOD RIGHT ARM  Result Value Ref Range Status   Specimen Description BLOOD RIGHT ARM  Final   Special Requests   Final    BOTTLES DRAWN AEROBIC AND ANAEROBIC Blood Culture results may not be optimal due to an inadequate volume of blood received in culture bottles   Culture   Final    NO GROWTH < 12 HOURS Performed at [facility], [address], [place], [place] [address]  Report Status PENDING  Incomplete  Culture, blood (Routine x 2)     Status: None (Preliminary result)   Collection Time: 03/26/24  8:35 PM   Specimen: BLOOD  Result Value Ref Range Status   Specimen Description BLOOD SITE NOT SPECIFIED  Final   Special Requests   Final    BOTTLES DRAWN AEROBIC AND ANAEROBIC Blood Culture adequate volume   Culture   Final    NO GROWTH < 12 HOURS Performed at [facility], [address], [place], [place] [address]    Report Status PENDING  Incomplete  Resp panel by RT-PCR (RSV, Flu A&B, Covid) Anterior Nasal Swab     Status: None   Collection Time: 03/26/24  8:41 PM   Specimen: Anterior Nasal Swab  Result Value Ref Range Status   SARS Coronavirus 2 by RT PCR NEGATIVE NEGATIVE Final   Influenza A by PCR NEGATIVE NEGATIVE Final   Influenza B by PCR NEGATIVE NEGATIVE Final    Comment: (NOTE) The Xpert Xpress SARS-CoV-2/FLU/RSV plus assay is intended as an aid in the diagnosis of influenza from Nasopharyngeal swab specimens and should not be used as a sole basis for treatment. Nasal washings and aspirates are unacceptable for Xpert Xpress SARS-CoV-2/FLU/RSV testing.  Fact Sheet for Patients: BloggerCourse.com  Fact Sheet for Healthcare Providers: SeriousBroker.it  This test is not yet approved or  cleared by the [place]  FDA and has been authorized for detection and/or diagnosis of SARS-CoV-2 by FDA under an Emergency Use Authorization (EUA). This EUA will remain in effect (meaning this test can be used) for the duration of the COVID-19 declaration under Section 564(b)(1) of the Act, 21 U.S.C. section 360bbb-3(b)(1), unless the authorization is terminated or revoked.     Resp Syncytial Virus by PCR NEGATIVE NEGATIVE Final    Comment: (NOTE) Fact Sheet for Patients: BloggerCourse.com  Fact Sheet for Healthcare Providers: SeriousBroker.it  This test is not yet approved or cleared by the [place]  FDA and has been authorized for detection and/or diagnosis of SARS-CoV-2 by FDA under an Emergency Use Authorization (EUA). This EUA will remain in effect (meaning this test can be used) for the duration of the COVID-19 declaration under Section 564(b)(1) of the Act, 21 U.S.C. section 360bbb-3(b)(1), unless the authorization is terminated or revoked.  Performed at [facility], [address], [place], [place] [address]     Radiology Studies: CT Head Wo Contrast Result Date: 03/26/2024 EXAM: CT HEAD WITHOUT 03/26/2024 09:11:00 PM TECHNIQUE: CT of the head was performed without the administration of intravenous contrast. Automated exposure control, iterative reconstruction, and/or weight based adjustment of the mA/kV was utilized to reduce the radiation dose to as low as reasonably achievable. COMPARISON: 08/22/2021 CLINICAL HISTORY: Mental status change, unknown cause. Altered Mental Status; CT Head Wo Contrast; Mental status change, unknown cause. Pt very altered in CT, best scans possible ; See ED Notes:; Pt bib ems from home with concern for ams. Pt's family states she was at baseline at 2200 last night. Pt has hx of dementia. Ems advised that pt has a strong smell of urine emanating from her person. FINDINGS: BRAIN AND  VENTRICLES: No acute intracranial hemorrhage. No mass effect or midline shift. No extra-axial fluid collection. Gray-white differentiation is maintained. No hydrocephalus. Generalized volume loss and chronic ischemic white matter changes. ORBITS: No acute abnormality. SINUSES AND MASTOIDS: No acute abnormality. SOFT TISSUES AND SKULL: No acute skull fracture. No acute soft tissue abnormality. IMPRESSION: 1. No acute intracranial  abnormality. 2. Generalized volume loss and chronic ischemic white matter changes. Electronically signed by: Franky Stanford MD 03/26/2024 09:19 PM EDT RP Workstation: [id number]    Scheduled Meds:  amLODipine   2.5 mg Oral Daily   atorvastatin   40 mg Oral Daily   clonazePAM   0.5 mg Oral Daily   divalproex   250 mg Oral BID   donepezil   5 mg Oral QPM   enoxaparin  (LOVENOX ) injection  40 mg Subcutaneous Q24H   insulin  aspart  0-9 Units Subcutaneous TID WC   insulin  glargine-yfgn  12 Units Subcutaneous QHS   losartan   50 mg Oral Daily   melatonin  3 mg Oral QHS   metoprolol  tartrate  50 mg Oral BID   risperiDONE   0.5 mg Oral Daily   sertraline   50 mg Oral Daily   traZODone   50 mg Oral QHS   Continuous Infusions:  cefTRIAXone  (ROCEPHIN )  IV     lactated ringers  75 mL/hr at 03/27/24 0521     LOS: 0 days   35 minutes with more than 50% spent in reviewing records, counseling patient/family and coordinating care.  Reyes VEAR Gaw, MD [facility] [email or web address] 03/27/2024, 3:17 PM

## 2024-03-27 NOTE — NC FL2 (Signed)
 [place]  MEDICAID FL2 LEVEL OF CARE FORM     IDENTIFICATION  Patient Name: Kristen Bowman Birthdate: 05-Nov-1930 Sex: female Admission Date (Current Location): 03/26/2024  [facility] and [place] Number:  Producer, television/film/video and Address:  [facility], [address] [place], [place] [address]      Provider Number: [id number]  Attending Physician Name and Address:  Elpidio Reyes DEL, MD  Relative Name and Phone Number:       Current Level of Care: Hospital Recommended Level of Care: Skilled Nursing Facility Prior Approval Number:    Date Approved/Denied:   PASRR Number: [id number]  Discharge Plan: SNF    Current Diagnoses: Patient Active Problem List   Diagnosis Date Noted   UTI (urinary tract infection) 03/27/2024   Acute encephalopathy 03/26/2024   Diabetes mellitus type 2 in nonobese (HCC) 03/26/2024   Dementia without behavioral disturbance (HCC) 10/17/2019   Pulmonary hypertension (HCC) 06/09/2014   Tachycardia 09/24/2012   Palpitations 09/24/2012   CKD (chronic kidney disease), stage II 09/24/2012   Anemia, iron  deficiency 09/24/2012   Hypertension    Panic disorder    Hyperlipidemia    Type II or unspecified type diabetes mellitus with renal manifestations, uncontrolled(250.42) 05/29/2010    Orientation RESPIRATION BLADDER Height & Weight     Self (fluctuating orientation)  Normal Incontinent Weight: 167 lb 12.3 oz (76.1 kg) Height:  5' 6 (167.6 cm)  BEHAVIORAL SYMPTOMS/MOOD NEUROLOGICAL BOWEL NUTRITION STATUS      Incontinent    AMBULATORY STATUS COMMUNICATION OF NEEDS Skin   Extensive Assist Verbally Normal                       Personal Care Assistance Level of Assistance  Bathing, Feeding, Dressing Bathing Assistance: Maximum assistance Feeding assistance: Limited assistance Dressing Assistance: Maximum assistance     Functional Limitations Info  Sight, Hearing, Speech Sight Info: Adequate Hearing Info:  Impaired Speech Info: Adequate    SPECIAL CARE FACTORS FREQUENCY  PT (By licensed PT), OT (By licensed OT)                    Contractures Contractures Info: Not present    Additional Factors Info  Code Status Code Status Info: FULL CODE             Current Medications (03/27/2024):  This is the current hospital active medication list Current Facility-Administered Medications  Medication Dose Route Frequency Provider Last Rate Last Admin   acetaminophen  (TYLENOL ) tablet 650 mg  650 mg Oral Q6H PRN Franky Redia SAILOR, MD       Or   acetaminophen  (TYLENOL ) suppository 650 mg  650 mg Rectal Q6H PRN Franky Redia SAILOR, MD       amLODipine  (NORVASC ) tablet 2.5 mg  2.5 mg Oral Daily Franky Redia SAILOR, MD   2.5 mg at 03/27/24 0841   atorvastatin  (LIPITOR) tablet 40 mg  40 mg Oral Daily Kakrakandy, Arshad N, MD   40 mg at 03/27/24 9157   cefTRIAXone  (ROCEPHIN ) 1 g in sodium chloride  0.9 % 100 mL IVPB  1 g Intravenous Q24H Franky Redia SAILOR, MD       clonazePAM  (KLONOPIN ) tablet 0.5 mg  0.5 mg Oral Daily Franky Redia SAILOR, MD   0.5 mg at 03/27/24 0840   divalproex  (DEPAKOTE  SPRINKLE) capsule 250 mg  250 mg Oral BID Kakrakandy, Arshad N, MD   250 mg at 03/27/24 0843   donepezil  (ARICEPT ) tablet  5 mg  5 mg Oral QPM Franky Redia SAILOR, MD       enoxaparin  (LOVENOX ) injection 40 mg  40 mg Subcutaneous Q24H Franky Redia SAILOR, MD       hydrALAZINE  (APRESOLINE ) injection 5 mg  5 mg Intravenous Q4H PRN Kakrakandy, Arshad N, MD       insulin  aspart (novoLOG ) injection 0-9 Units  0-9 Units Subcutaneous TID WC Kakrakandy, Arshad N, MD   5 Units at 03/27/24 9155   insulin  glargine-yfgn (SEMGLEE ) injection 16 Units  16 Units Subcutaneous QHS Elpidio Reyes DEL, MD       lactated ringers  infusion   Intravenous Continuous Franky Redia SAILOR, MD 75 mL/hr at 03/27/24 0521 New Bag at 03/27/24 0521   losartan  (COZAAR ) tablet 50 mg  50 mg Oral Daily Kakrakandy, Arshad N, MD   50 mg at  03/27/24 9157   melatonin tablet 3 mg  3 mg Oral QHS Kakrakandy, Arshad N, MD       metoprolol  tartrate (LOPRESSOR ) tablet 50 mg  50 mg Oral BID Kakrakandy, Arshad N, MD   50 mg at 03/27/24 9158   risperiDONE  (RISPERDAL ) tablet 0.5 mg  0.5 mg Oral Daily Franky Redia SAILOR, MD       sertraline  (ZOLOFT ) tablet 50 mg  50 mg Oral Daily Franky Redia SAILOR, MD   50 mg at 03/27/24 0840   traZODone  (DESYREL ) tablet 50 mg  50 mg Oral QHS Franky Redia SAILOR, MD         Discharge Medications: Please see discharge summary for a list of discharge medications.  Relevant Imaging Results:  Relevant Lab Results:   Additional Information SS# [id number]  Firmin Belisle [place], [place]

## 2024-03-27 NOTE — Plan of Care (Signed)

## 2024-03-27 NOTE — TOC Initial Note (Signed)
 Transition of Care [facility]) - Initial/Assessment Note    Patient Details  Name: Kristen Bowman MRN: [id number] Date of Birth: 1930/12/03  [facility] CM/SW Contact:    Gwenn Julien [place], [place] Phone Number: 03/27/2024, 5:22 PM  Clinical Narrative: SW spoke with pt's dtr [name] re PT recommendation for SNF. Pt from home with her dtr who is her primary caregiver. Pt's granddtr assists daily as needed. Pt with prior ALF stay at [facility]. Per dtr, pt was dc from [facility] in March 2024 after private paying over $300,000 and pt did not qualify for Medicaid due to a pension. Dtr reports pt was attending [employer] 3 hours/week until recent decline in mobility. Pt has a RW, BSC, and wheelchair at home. No HH in place at this time. Reviewed SNF placement process and answered questions. Dtr agreeable to SNF for STR. Will begin SNF search and f/u with offers as available.   Julien Gwenn, MSW, LCSW [phone] (coverage)    Expected Discharge Plan: Skilled Nursing Facility Barriers to Discharge: Continued Medical Work up, SNF Pending bed offer, Insurance Authorization   Patient Goals and CMS Choice   CMS Medicare.gov Compare Post Acute Care list provided to:: Other (Comment Required) (adult daughter) Choice offered to / list presented to : Adult Children [place] ownership interest in [facility].provided to:: Adult Children    Expected Discharge Plan and Services     Post Acute Care Choice: Skilled Nursing Facility Living arrangements for the past 2 months: Single Family Home                                      Prior Living Arrangements/Services Living arrangements for the past 2 months: Single Family Home Lives with:: Adult Children Patient language and need for interpreter reviewed:: Yes        Need for Family Participation in Patient Care: Yes (Comment) Care giver support system in place?: Yes (comment)   Criminal  Activity/Legal Involvement Pertinent to Current Situation/Hospitalization: No - Comment as needed  Activities of Daily Living   ADL Screening (condition at time of admission) Independently performs ADLs?: No Does the patient have a NEW difficulty with bathing/dressing/toileting/self-feeding that is expected to last >3 days?: No Does the patient have a NEW difficulty with getting in/out of bed, walking, or climbing stairs that is expected to last >3 days?: No Does the patient have a NEW difficulty with communication that is expected to last >3 days?: No Is the patient deaf or have difficulty hearing?: No Does the patient have difficulty seeing, even when wearing glasses/contacts?: No Does the patient have difficulty concentrating, remembering, or making decisions?: Yes  Permission Sought/Granted Permission sought to share information with : Oceanographer granted to share information with : Yes, Verbal Permission Granted              Emotional Assessment       Orientation: : Oriented to Self, Fluctuating Orientation (Suspected and/or reported Sundowners) Alcohol / Substance Use: Not Applicable Psych Involvement: No (comment)  Admission diagnosis:  Acute cystitis without hematuria [N30.00] Acute encephalopathy [G93.40] Altered mental status, unspecified altered mental status type [R41.82] Patient Active Problem List   Diagnosis Date Noted   UTI (urinary tract infection) 03/27/2024   Acute encephalopathy 03/26/2024   Diabetes mellitus type 2 in nonobese (HCC) 03/26/2024   Dementia without behavioral disturbance (HCC) 10/17/2019  Pulmonary hypertension (HCC) 06/09/2014   Tachycardia 09/24/2012   Palpitations 09/24/2012   CKD (chronic kidney disease), stage II 09/24/2012   Anemia, iron  deficiency 09/24/2012   Hypertension    Panic disorder    Hyperlipidemia    Type II or unspecified type diabetes mellitus with renal manifestations,  uncontrolled(250.42) 05/29/2010   PCP:  Valentin Skates, DO Pharmacy:   [phone] - [place], [place] - [address] AT [facility] OF [address] [place] [address] Phone: [phone] Fax: [phone]     Social Drivers of Health (SDOH) Social History: SDOH Screenings   Food Insecurity: No Food Insecurity (03/27/2024)  Housing: Low Risk  (03/27/2024)  Transportation Needs: No Transportation Needs (03/27/2024)  Utilities: Not At Risk (03/27/2024)  Social Connections: Socially Isolated (03/27/2024)  Tobacco Use: Low Risk  (03/27/2024)   SDOH Interventions:     Readmission Risk Interventions     No data to display

## 2024-03-27 NOTE — ED Notes (Signed)
 2 W called.  Report given to RN

## 2024-03-27 NOTE — Care Management Obs Status (Signed)
 MEDICARE OBSERVATION STATUS NOTIFICATION   Patient Details  Name: Kristen Bowman MRN: [id number] Date of Birth: [birth year]   Medicare Observation Status Notification Given:  Yes    Fallyn Munnerlyn G., RN 03/27/2024, 9:03 AM

## 2024-03-27 NOTE — Evaluation (Signed)
 Physical Therapy Evaluation Patient Details Name: Kristen Bowman MRN: [id number] DOB: [birth year] Today's Date: 03/27/2024  History of Present Illness  The pt is a 89yo female presenting 8/1 with AMS. Work up revealed UTI. PMH includes: dementia, anemia, DM II, osteoarthritis, HLD, CKD II HTN, IBS, and afib.  Clinical Impression  Pt in bed upon arrival of PT, agreeable to evaluation at this time. The pt does report use of WC at baseline (supported by information in chart), but otherwise was unable to convey baseline level of mobility, assist, or home set-up. The pt presents oriented to self only, followed simple commands inconsistently, and needed increased time and cues to complete sequencing of tasks and mobility. The pt was dependent on totalA to complete transition to sitting EOB due to inability to assist functionally with movement of LE or UE to move towards EOB, pt also dependent on maxA to complete sit-stand and small lateral steps along EOB. Pt dependent on facilitation for anterior wt shift to stand, support to correct posterior and R lateal lean, and to facilitate wt shift laterally for small steps. Pt fatigues quickly and would be dependent on assist from family for any movement, toileting, and transfers. Given current presentation, recommend continued skilled PT acutely and in inpatient setting after d/c. If family prefers to return home, pt will need 24/7 supervision and physical assist for any mobility, likely benefiting from lift for OOB transfers.     If plan is discharge home, recommend the following: Two people to help with walking and/or transfers;Two people to help with bathing/dressing/bathroom;Assistance with cooking/housework;Assistance with feeding;Direct supervision/assist for medications management;Direct supervision/assist for financial management;Assist for transportation;Help with stairs or ramp for entrance;Supervision due to cognitive status   Can travel by private vehicle    No    Equipment Recommendations BSC/3in1;Wheelchair (measurements PT);Wheelchair cushion (measurements PT);Hospital bed;Hoyer lift  Recommendations for Other Services  OT consult    Functional Status Assessment Patient has had a recent decline in their functional status and demonstrates the ability to make significant improvements in function in a reasonable and predictable amount of time.     Precautions / Restrictions Precautions Precautions: Fall Recall of Precautions/Restrictions: Impaired Restrictions Weight Bearing Restrictions Per Provider Order: No Other Position/Activity Restrictions: chronic diarrhea      Mobility  Bed Mobility Overal bed mobility: Needs Assistance Bed Mobility: Rolling, Supine to Sit, Sit to Supine Rolling: Max assist, Used rails   Supine to sit: Total assist, HOB elevated Sit to supine: Total assist, HOB elevated   General bed mobility comments: attempted to cue for use of rails with rolling, pt using only with max hand-over-hand assist. totalA to complete transition to sitting at EOB, persistent R lateral lean    Transfers Overall transfer level: Needs assistance Equipment used: 1 person hand held assist Transfers: Sit to/from Stand Sit to Stand: Max assist           General transfer comment: front-facing transfer support with pt holding PT arms bilaterally and bilateral knee blocking. with PT performing anterior wt shifts to generate momentum, pt able to rise to standing with maxA. pt with posterior and R lateral lean in stance. unable to identifiy or correct    Ambulation/Gait Ambulation/Gait assistance: Max assist Gait Distance (Feet): 3 Feet Assistive device: 1 person hand held assist Gait Pattern/deviations: Step-to pattern, Decreased stride length, Decreased dorsiflexion - right, Decreased dorsiflexion - left Gait velocity: decreased Gait velocity interpretation: <1.31 ft/sec, indicative of household ambulator   General Gait  Details: pt with  small lateral steps with PT facilitating wt shift and providing cues for each step. minimal clearance. posterior and R lateral lean maintained      Balance Overall balance assessment: Needs assistance Sitting-balance support: Bilateral upper extremity supported, Feet supported Sitting balance-Leahy Scale: Poor Sitting balance - Comments: R and posterior LOB with pt unable to identify or correct Postural control: Posterior lean, Right lateral lean Standing balance support: Bilateral upper extremity supported, During functional activity Standing balance-Leahy Scale: Zero Standing balance comment: posterior and R lateral lean maintained, maxA to maintain static stand                             Pertinent Vitals/Pain Pain Assessment Pain Assessment: Faces Faces Pain Scale: Hurts a little bit Pain Location: general grimace but pt unable to clarify cause Pain Descriptors / Indicators: Discomfort, Grimacing Pain Intervention(s): Limited activity within patient's tolerance, Monitored during session, Repositioned    Home Living Family/patient expects to be discharged to:: Private residence Living Arrangements: Children                 Additional Comments: pt unable to relate home living situation, states she lives with a spouse but is not oriented to location or time. no family present    Prior Function Prior Level of Function : Needs assist;Patient poor historian/Family not available             Mobility Comments: pt reports use of WC and standing to get into [facility], unable to answer questions regarding assist for ADLs or transfer. ADLs Comments: no family present to answer     Extremity/Trunk Assessment   Upper Extremity Assessment Upper Extremity Assessment: Generalized weakness;Difficult to assess due to impaired cognition    Lower Extremity Assessment Lower Extremity Assessment: Generalized weakness;Difficult to assess due to impaired  cognition (grossly 3-/5 limited due to poor command following, initiation, and sequencing)    Cervical / Trunk Assessment Cervical / Trunk Assessment: Kyphotic  Communication   Communication Communication: Impaired Factors Affecting Communication: Difficulty expressing self    Cognition Arousal: Alert Behavior During Therapy: Flat affect   PT - Cognitive impairments: History of cognitive impairments, Orientation, Awareness, Memory, Attention, Initiation, Sequencing, Problem solving, Safety/Judgement   Orientation impairments: Place, Time, Situation (pt reports 1991, knows not at home but unsure of location or situation)                   PT - Cognition Comments: pt able to follow limited commands with increased time, poor initiation, memory, awareness (not aware of BM or LOB), needing repeated cues. no family present to confirm baseline Following commands: Impaired Following commands impaired: Follows one step commands inconsistently, Follows one step commands with increased time     Cueing Cueing Techniques: Verbal cues, Gestural cues, Tactile cues, Visual cues     General Comments General comments (skin integrity, edema, etc.): VSS on RA, pt with chronic diarrhea and unaware of soiled by BM, unable to call for assistance dispite education on call bell useage due to AMS.    Exercises     Assessment/Plan    PT Assessment Patient needs continued PT services  PT Problem List Decreased strength;Decreased range of motion;Decreased balance;Decreased activity tolerance;Decreased mobility;Decreased coordination;Decreased cognition;Decreased safety awareness;Decreased knowledge of precautions       PT Treatment Interventions DME instruction;Gait training;Stair training;Therapeutic activities;Functional mobility training;Therapeutic exercise;Balance training;Cognitive remediation;Neuromuscular re-education;Patient/family education    PT Goals (Current goals can be found in the  Care Plan section)  Acute Rehab PT Goals Patient Stated Goal: none stated PT Goal Formulation: Patient unable to participate in goal setting Time For Goal Achievement: 04/10/24 Potential to Achieve Goals: Fair    Frequency Min 2X/week     Co-evaluation               AM-PAC PT 6 Clicks Mobility  Outcome Measure Help needed turning from your back to your side while in a flat bed without using bedrails?: Total Help needed moving from lying on your back to sitting on the side of a flat bed without using bedrails?: Total Help needed moving to and from a bed to a chair (including a wheelchair)?: Total Help needed standing up from a chair using your arms (e.g., wheelchair or bedside chair)?: Total Help needed to walk in hospital room?: Total Help needed climbing 3-5 steps with a railing? : Total 6 Click Score: 6    End of Session Equipment Utilized During Treatment: Gait belt Activity Tolerance: Patient tolerated treatment well Patient left: in bed;with call bell/phone within reach;with bed alarm set Nurse Communication: Mobility status PT Visit Diagnosis: Unsteadiness on feet (R26.81);Other abnormalities of gait and mobility (R26.89);Muscle weakness (generalized) (M62.81)    Time: 1030-1101 PT Time Calculation (min) (ACUTE ONLY): 31 min   Charges:   PT Evaluation $PT Eval Moderate Complexity: 1 Mod PT Treatments $Therapeutic Activity: 8-22 mins PT General Charges $$ ACUTE PT VISIT: 1 Visit         Izetta Call, PT, DPT   Acute Rehabilitation Department Office [phone] Secure Chat Communication Preferred  Izetta JULIANNA Call 03/27/2024, 12:06 PM

## 2024-03-28 LAB — BASIC METABOLIC PANEL WITH GFR
Anion gap: 7 (ref 5–15)
BUN: 10 mg/dL (ref 8–23)
CO2: 25 mmol/L (ref 22–32)
Calcium: 8.1 mg/dL — ABNORMAL LOW (ref 8.9–10.3)
Chloride: 104 mmol/L (ref 98–111)
Creatinine, Ser: 0.91 mg/dL (ref 0.44–1.00)
GFR, Estimated: 60 mL/min — ABNORMAL LOW (ref >60)
Glucose, Bld: 142 mg/dL — ABNORMAL HIGH (ref 70–99)
Potassium: 3.4 mmol/L — ABNORMAL LOW (ref 3.5–5.1)
Sodium: 136 mmol/L (ref 135–145)

## 2024-03-28 LAB — CBC
HCT: 29.3 % — ABNORMAL LOW (ref 36.0–46.0)
Hemoglobin: 9.7 g/dL — ABNORMAL LOW (ref 12.0–15.0)
MCH: 28.7 pg (ref 26.0–34.0)
MCHC: 33.1 g/dL (ref 30.0–36.0)
MCV: 86.7 fL (ref 80.0–100.0)
Platelets: 189 K/uL (ref 150–400)
RBC: 3.38 MIL/uL — ABNORMAL LOW (ref 3.87–5.11)
RDW: 13.6 % (ref 11.5–15.5)
WBC: 6.8 K/uL (ref 4.0–10.5)
nRBC: 0 % (ref 0.0–0.2)

## 2024-03-28 LAB — GLUCOSE, CAPILLARY
Glucose-Capillary: 217 mg/dL — ABNORMAL HIGH (ref 70–99)
Glucose-Capillary: 129 mg/dL — ABNORMAL HIGH (ref 70–99)
Glucose-Capillary: 218 mg/dL — ABNORMAL HIGH (ref 70–99)
Glucose-Capillary: 163 mg/dL — ABNORMAL HIGH (ref 70–99)
Glucose-Capillary: 217 mg/dL — ABNORMAL HIGH (ref 70–99)

## 2024-03-28 MED ORDER — INSULIN ASPART 100 UNIT/ML IJ SOLN
0.0000 [IU] | Freq: Three times a day (TID) | INTRAMUSCULAR | Status: DC
  Administered 2024-03-28: 1 [IU] via SUBCUTANEOUS
  Administered 2024-03-28: 2 [IU] via SUBCUTANEOUS
  Administered 2024-03-28 – 2024-03-29 (×4): 3 [IU] via SUBCUTANEOUS
  Administered 2024-03-30: 5 [IU] via SUBCUTANEOUS
  Administered 2024-03-30: 3 [IU] via SUBCUTANEOUS
  Administered 2024-03-30: 5 [IU] via SUBCUTANEOUS
  Administered 2024-03-31 – 2024-04-01 (×4): 2 [IU] via SUBCUTANEOUS

## 2024-03-28 MED ORDER — INSULIN ASPART 100 UNIT/ML IJ SOLN
0.0000 [IU] | Freq: Every day | INTRAMUSCULAR | Status: DC
  Administered 2024-03-28 (×2): 2 [IU] via SUBCUTANEOUS
  Administered 2024-03-29: 4 [IU] via SUBCUTANEOUS
  Administered 2024-03-30 – 2024-03-31 (×2): 2 [IU] via SUBCUTANEOUS

## 2024-03-28 MED ORDER — INSULIN GLARGINE-YFGN 100 UNIT/ML ~~LOC~~ SOLN
18.0000 [IU] | Freq: Every day | SUBCUTANEOUS | Status: DC
  Administered 2024-03-28 – 2024-03-29 (×2): 18 [IU] via SUBCUTANEOUS
  Filled 2024-03-28 (×3): qty 0.18

## 2024-03-28 MED ORDER — AMLODIPINE BESYLATE 5 MG PO TABS
5.0000 mg | ORAL_TABLET | Freq: Every day | ORAL | Status: DC
  Filled 2024-03-28: qty 1

## 2024-03-28 MED ORDER — POTASSIUM CHLORIDE CRYS ER 20 MEQ PO TBCR
40.0000 meq | EXTENDED_RELEASE_TABLET | Freq: Once | ORAL | Status: AC
  Administered 2024-03-28: 40 meq via ORAL
  Filled 2024-03-28: qty 2

## 2024-03-28 NOTE — Plan of Care (Signed)

## 2024-03-28 NOTE — Progress Notes (Signed)
 PROGRESS NOTE  Kristen Bowman  FMW:[id number] DOB: [birth year] DOA: 03/26/2024 PCP: Valentin Skates, DO  Consult[facility]  Brief Narrative: 89 y.o. female with history of dementia, diabetes mellitus type 2, hypertension, chronic kidney disease stage II, anemia, depression was brought to the ER after patient was feeling increasingly weak poor appetite and confused over the last 2 to 3 days.  Per daughter who provided most of the history patient usually ambulates with help and has not been doing it for last 2 to 3 days and has been been eating less.  Did not have any nausea vomiting.  Has chronic diarrhea but has not actually worsened over the last few days.   ED Course: In the ER patient was having a fever of 101 F.  UA is concerning for UTI.  CT of the head did not show anything acute.  Blood pressure was also elevated initially improved with IV hydralazine .  Cultures were sent and started on empiric antibiotics admitted for acute encephalopathy suspect likely from UTI.  Labs show anemia and mild renal failure which are chronic.   Assessment & Plan: Acute encephalopathy likely from UTI.   - Was started on ceftriaxone  will follow cultures.   - Much more awake and alert today.  She did not remember me from yesterday but able to hold a pretty coherent conversation. - Yesterday she was able to work with physical therapy.  I did discuss with her daughter and she has dementia at baseline but is able to get up and walk around. - Recommended for SNF at discharge. - Will continue ceftriaxone  inpatient but her acute encephalopathy is clearing  History of depression  - on Depakote , Risperdal  Zoloft  and trazodone .  Check Depakote  levels and ammonia levels.  And on Klonopin  for anxiety. - Valproic acid  level is low.  May need to increase defer to outpatient   Hypertension uncontrolled  - on amlodipine  metoprolol  and ARB.   - Follow blood pressure trends--> notably elevated here.  I have increased her amlodipine   today  Diabetes mellitus type 2  - uncontrolled.   - check A1c.  Patient takes Lantus  18 units but patient's daughter has decreased it to 16 units due to poor oral intake - On admit and to be safe she was further decreased to 12 units Lantus  - Increased to 16 units 8/2 and increasing to 18 units today with sliding scale coverage as CBGs have been mostly elevated  Chronic kidney disease stage II  - creatinine around baseline.  Actually normal now.  Hypokalemia: - Replaced today  Chronic anemia  - likely from renal disease follow CBC.   - Slowly downtrending  Dementia  - Lived in nursing home for 3 years, then came home about 1.5 years ago.  - Notable decline in overall cognition per daughter since being placed in the nursing home - on Aricept . - Plan will be for hospice to follow outpatient after discharge to SNF.  Chronic diarrhea  - Stable currently.         DVT prophylaxis:  enoxaparin  (LOVENOX ) injection 40 mg Start: 03/27/24 1600  Code Status:   Code Status: Full Code Family Communication: Spoke with daughter and all questions answered Level of care: Telemetry Medical Status is: Observation   Subjective: Patient awake and alert on my exam.  Conversant.  New name, birthdate, she was in [facility].  No complaints today.  Objective: Vitals:   03/27/24 2210 03/28/24 0426 03/28/24 0823 03/28/24 1209  BP: (!) 179/65 (!) 170/69 ROLLEN)  189/59 (!) 170/51  Pulse: 90 74 77 64  Resp: 15 16    Temp: 98 F (36.7 C) 98.8 F (37.1 C) 97.9 F (36.6 C) 98.6 F (37 C)  TempSrc: Oral Oral  Oral  SpO2: 93% 98% 97% 95%  Weight:      Height:        Intake/Output Summary (Last 24 hours) at 03/28/2024 1458 Last data filed at 03/28/2024 0500 Gross per 24 hour  Intake 1101.87 ml  Output 1300 ml  Net -198.13 ml   Filed Weights   03/27/24 0140  Weight: 76.1 kg   Body mass index is 27.08 kg/m.  Gen: 89 y.o. female in no apparent distress.  Nontoxic Pulm: Non-labored  breathing.  Clear to auscultation bilaterally.  CV: Regular rate and rhythm. No murmur, rub, or gallop. No JVD GI: Abdomen soft, non-tender, non-distended, with normoactive bowel sounds. No organomegaly or masses felt. Ext: Warm, no deformities Skin: No rashes, lesions  Neuro: Alert and oriented to person and place.  Much more conversant today than yesterday.  Appears to be at baseline per her daughters description   I have personally reviewed the following labs and images: CBC: Recent Labs  Lab 03/26/24 2005 03/27/24 0957 03/28/24 0919  WBC 11.5* 8.3 6.8  NEUTROABS 9.2* 6.5  --   HGB 10.9* 9.8* 9.7*  HCT 33.3* 30.1* 29.3*  MCV 86.3 86.7 86.7  PLT 205 181 189   BMP &GFR Recent Labs  Lab 03/26/24 2005 03/27/24 0957 03/28/24 0919  NA 136 133* 136  K 3.4* 3.6 3.4*  CL 102 101 104  CO2 23 23 25   GLUCOSE 227* 302* 142*  BUN 14 13 10   CREATININE 1.01* 0.84 0.91  CALCIUM  8.4* 8.1* 8.1*   Estimated Creatinine Clearance: 42.8 mL/min (by C-G formula based on SCr of 0.91 mg/dL). Liver & Pancreas: Recent Labs  Lab 03/26/24 2005 03/27/24 0957  AST 32 21  ALT 26 21  ALKPHOS 70 60  BILITOT 1.1 0.4  PROT 6.0* 5.4*  ALBUMIN  2.3* 1.9*   No results for input(s): LIPASE, AMYLASE in the last 168 hours. Recent Labs  Lab 03/27/24 0957  AMMONIA 64*   Diabetic: No results for input(s): HGBA1C in the last 72 hours. Recent Labs  Lab 03/27/24 2030 03/27/24 2232 03/28/24 0121 03/28/24 0823 03/28/24 1214  GLUCAP 333* 298* 217* 129* 218*   Cardiac Enzymes: No results for input(s): CKTOTAL, CKMB, CKMBINDEX, TROPONINI in the last 168 hours. No results for input(s): PROBNP in the last 8760 hours. Coagulation Profile: Recent Labs  Lab 03/26/24 2005  INR 1.2   Thyroid  Function Tests: Recent Labs    03/27/24 0957  TSH 1.924   Lipid Profile: No results for input(s): CHOL, HDL, LDLCALC, TRIG, CHOLHDL, LDLDIRECT in the last 72 hours. Anemia  Panel: Recent Labs    03/27/24 0957  VITAMINB12 550  FERRITIN 144  TIBC 172*  IRON  10*  RETICCTPCT 1.5   Urine analysis:    Component Value Date/Time   COLORURINE YELLOW 03/26/2024 2035   APPEARANCEUR TURBID (A) 03/26/2024 2035   LABSPEC 1.014 03/26/2024 2035   PHURINE 5.0 03/26/2024 2035   GLUCOSEU NEGATIVE 03/26/2024 2035   HGBUR SMALL (A) 03/26/2024 2035   BILIRUBINUR NEGATIVE 03/26/2024 2035   KETONESUR NEGATIVE 03/26/2024 2035   PROTEINUR 100 (A) 03/26/2024 2035   UROBILINOGEN 0.2 09/24/2012 2220   NITRITE NEGATIVE 03/26/2024 2035   LEUKOCYTESUR MODERATE (A) 03/26/2024 2035   Sepsis Labs: Invalid input(s): PROCALCITONIN, LACTICIDVEN  Microbiology: Recent  Results (from the past 240 hours)  Culture, blood (Routine x 2)     Status: None (Preliminary result)   Collection Time: 03/26/24  8:32 PM   Specimen: BLOOD RIGHT ARM  Result Value Ref Range Status   Specimen Description BLOOD RIGHT ARM  Final   Special Requests   Final    BOTTLES DRAWN AEROBIC AND ANAEROBIC Blood Culture results may not be optimal due to an inadequate volume of blood received in culture bottles   Culture   Final    NO GROWTH < 12 HOURS Performed at [facility], [address] [place], [place] [address]    Report Status PENDING  Incomplete  Culture, blood (Routine x 2)     Status: None (Preliminary result)   Collection Time: 03/26/24  8:35 PM   Specimen: BLOOD  Result Value Ref Range Status   Specimen Description BLOOD SITE NOT SPECIFIED  Final   Special Requests   Final    BOTTLES DRAWN AEROBIC AND ANAEROBIC Blood Culture adequate volume   Culture   Final    NO GROWTH < 12 HOURS Performed at [facility], [address], [place], [place] [address]    Report Status PENDING  Incomplete  Resp panel by RT-PCR (RSV, Flu A&B, Covid) Anterior Nasal Swab     Status: None   Collection Time: 03/26/24  8:41 PM   Specimen: Anterior Nasal Swab  Result Value Ref Range Status   SARS  Coronavirus 2 by RT PCR NEGATIVE NEGATIVE Final   Influenza A by PCR NEGATIVE NEGATIVE Final   Influenza B by PCR NEGATIVE NEGATIVE Final    Comment: (NOTE) The Xpert Xpress SARS-CoV-2/FLU/RSV plus assay is intended as an aid in the diagnosis of influenza from Nasopharyngeal swab specimens and should not be used as a sole basis for treatment. Nasal washings and aspirates are unacceptable for Xpert Xpress SARS-CoV-2/FLU/RSV testing.  Fact Sheet for Patients: BloggerCourse.com  Fact Sheet for Healthcare Providers: SeriousBroker.it  This test is not yet approved or cleared by the [place]  FDA and has been authorized for detection and/or diagnosis of SARS-CoV-2 by FDA under an Emergency Use Authorization (EUA). This EUA will remain in effect (meaning this test can be used) for the duration of the COVID-19 declaration under Section 564(b)(1) of the Act, 21 U.S.C. section 360bbb-3(b)(1), unless the authorization is terminated or revoked.     Resp Syncytial Virus by PCR NEGATIVE NEGATIVE Final    Comment: (NOTE) Fact Sheet for Patients: BloggerCourse.com  Fact Sheet for Healthcare Providers: SeriousBroker.it  This test is not yet approved or cleared by the [place]  FDA and has been authorized for detection and/or diagnosis of SARS-CoV-2 by FDA under an Emergency Use Authorization (EUA). This EUA will remain in effect (meaning this test can be used) for the duration of the COVID-19 declaration under Section 564(b)(1) of the Act, 21 U.S.C. section 360bbb-3(b)(1), unless the authorization is terminated or revoked.  Performed at [facility], [address] [place], [place] [address]   Urine Culture (for pregnant, neutropenic or urologic patients or patients with an indwelling urinary catheter)     Status: Abnormal (Preliminary result)   Collection Time: 03/27/24  8:01  PM   Specimen: Urine, Catheterized  Result Value Ref Range Status   Specimen Description URINE, CATHETERIZED  Final   Special Requests NONE  Final   Culture (A)  Final    >=100,000 COLONIES/mL ESCHERICHIA COLI SUSCEPTIBILITIES TO FOLLOW CULTURE REINCUBATED FOR BETTER  GROWTH Performed at [facility], [address] [place], [place] [address]    Report Status PENDING  Incomplete    Radiology Studies: No results found.   Scheduled Meds:  amLODipine   2.5 mg Oral Daily   atorvastatin   40 mg Oral Daily   clonazePAM   0.5 mg Oral Daily   divalproex   250 mg Oral BID   donepezil   5 mg Oral QPM   enoxaparin  (LOVENOX ) injection  40 mg Subcutaneous Q24H   insulin  aspart  0-5 Units Subcutaneous QHS   insulin  aspart  0-9 Units Subcutaneous TID WC   insulin  glargine-yfgn  16 Units Subcutaneous QHS   losartan   50 mg Oral Daily   melatonin  3 mg Oral QHS   metoprolol  tartrate  50 mg Oral BID   risperiDONE   0.5 mg Oral Daily   sertraline   50 mg Oral Daily   traZODone   50 mg Oral QHS   Continuous Infusions:  cefTRIAXone  (ROCEPHIN )  IV 1 g (03/27/24 2239)     LOS: 1 day   35 minutes with more than 50% spent in reviewing records, counseling patient/family and coordinating care.  Reyes VEAR Gaw, MD [facility] [email or web address] 03/28/2024, 2:58 PM

## 2024-03-28 NOTE — Progress Notes (Signed)
 [facility] 2W23 [place] Liaison Note  This patient was scheduled for hospice admission with [facility] 8.3.2025.  It appears SNF has been recommended. We can follow with outpatient palliative when facility is determined with referral.  Hospital Liaison Team will follow for discharge disposition.  Please feel free to call with any questions or concerns.  Thank you, Randine Nail, BSN, [facility] [phone]

## 2024-03-28 NOTE — Plan of Care (Signed)

## 2024-03-29 ENCOUNTER — Inpatient Hospital Stay (HOSPITAL_COMMUNITY)

## 2024-03-29 DIAGNOSIS — G9340 Encephalopathy, unspecified: Secondary | ICD-10-CM | POA: Diagnosis not present

## 2024-03-29 LAB — TROPONIN I (HIGH SENSITIVITY): Troponin I (High Sensitivity): 21 ng/L — ABNORMAL HIGH (ref <18)

## 2024-03-29 LAB — CBC
HCT: 31.2 % — ABNORMAL LOW (ref 36.0–46.0)
Hemoglobin: 10 g/dL — ABNORMAL LOW (ref 12.0–15.0)
MCH: 28 pg (ref 26.0–34.0)
MCHC: 32.1 g/dL (ref 30.0–36.0)
MCV: 87.4 fL (ref 80.0–100.0)
Platelets: 210 K/uL (ref 150–400)
RBC: 3.57 MIL/uL — ABNORMAL LOW (ref 3.87–5.11)
RDW: 13.5 % (ref 11.5–15.5)
WBC: 5.9 K/uL (ref 4.0–10.5)
nRBC: 0 % (ref 0.0–0.2)

## 2024-03-29 LAB — BASIC METABOLIC PANEL WITH GFR
Anion gap: 8 (ref 5–15)
BUN: 9 mg/dL (ref 8–23)
CO2: 24 mmol/L (ref 22–32)
Calcium: 8.4 mg/dL — ABNORMAL LOW (ref 8.9–10.3)
Chloride: 106 mmol/L (ref 98–111)
Creatinine, Ser: 0.89 mg/dL (ref 0.44–1.00)
GFR, Estimated: >60 mL/min (ref >60)
Glucose, Bld: 227 mg/dL — ABNORMAL HIGH (ref 70–99)
Potassium: 3.9 mmol/L (ref 3.5–5.1)
Sodium: 138 mmol/L (ref 135–145)

## 2024-03-29 LAB — GLUCOSE, CAPILLARY
Glucose-Capillary: 237 mg/dL — ABNORMAL HIGH (ref 70–99)
Glucose-Capillary: 214 mg/dL — ABNORMAL HIGH (ref 70–99)
Glucose-Capillary: 255 mg/dL — ABNORMAL HIGH (ref 70–99)
Glucose-Capillary: 317 mg/dL — ABNORMAL HIGH (ref 70–99)
Glucose-Capillary: 347 mg/dL — ABNORMAL HIGH (ref 70–99)

## 2024-03-29 MED ORDER — METOPROLOL TARTRATE 25 MG PO TABS
25.0000 mg | ORAL_TABLET | Freq: Once | ORAL | Status: AC
  Administered 2024-03-29: 25 mg via ORAL
  Filled 2024-03-29: qty 1

## 2024-03-29 MED ORDER — LACTULOSE 10 GM/15ML PO SOLN
20.0000 g | Freq: Two times a day (BID) | ORAL | Status: DC
  Administered 2024-03-29 – 2024-04-01 (×7): 20 g via ORAL
  Filled 2024-03-29 (×7): qty 30

## 2024-03-29 MED ORDER — METOPROLOL TARTRATE 50 MG PO TABS
75.0000 mg | ORAL_TABLET | Freq: Two times a day (BID) | ORAL | Status: DC
  Administered 2024-03-29 – 2024-04-01 (×6): 75 mg via ORAL
  Filled 2024-03-29 (×6): qty 1

## 2024-03-29 MED ORDER — METOPROLOL TARTRATE 25 MG PO TABS: 25.0000 mg | ORAL_TABLET | Freq: Two times a day (BID) | ORAL | Status: DC

## 2024-03-29 MED ORDER — AMLODIPINE BESYLATE 10 MG PO TABS
10.0000 mg | ORAL_TABLET | Freq: Every day | ORAL | Status: DC
  Administered 2024-03-29 – 2024-04-01 (×4): 10 mg via ORAL
  Filled 2024-03-29 (×4): qty 1

## 2024-03-29 NOTE — Plan of Care (Signed)
   Problem: Education: Goal: Knowledge of General Education information will improve Description: Including pain rating scale, medication(s)/side effects and non-pharmacologic comfort measures Outcome: Progressing   Problem: Health Behavior/Discharge Planning: Goal: Ability to manage health-related needs will improve Outcome: Progressing   Problem: Nutrition: Goal: Adequate nutrition will be maintained Outcome: Progressing

## 2024-03-29 NOTE — Progress Notes (Addendum)
 PROGRESS NOTE  Kristen Bowman  FMW:[id number] DOB: 02-Nov-1930 DOA: 03/26/2024 PCP: Valentin Skates, DO  Consultants  Addendum:03/29/2024 6:17 PM Discussed new RBBB and sinus tachycardia with on-call PA for cardiology.  As pt is asx and planning to do home with hospice and not a good candidate for interventions, would not trend troponin.  Could increase beta blocker if heart rate remains elevated -- I increased to 75 mg po BID today.   Brief Narrative: 89 y.o. female with history of dementia, diabetes mellitus type 2, hypertension, chronic kidney disease stage II, anemia, depression was brought to the ER after patient was feeling increasingly weak poor appetite and confused over the last 2 to 3 days.  Per daughter who provided most of the history patient usually ambulates with help and has not been doing it for last 2 to 3 days and has been been eating less.  Did not have any nausea vomiting.  Has chronic diarrhea but has not actually worsened over the last few days.  In the ER patient had fever to 101 F.  UA is concerning for UTI.  CT of the head did not show anything acute.  Blood pressure was also elevated initially improved with IV hydralazine .  Cultures were sent and started on empiric antibiotics admitted for acute encephalopathy suspect likely from UTI.  Labs show anemia and mild renal failure which are chronic. Admitted to [facility] for UTI.  Started on abx.  Mental status has been clearing since admission, back to baseline 03/28/2024. 03/29/2024:  sinus tachycardia began mid-AM.  Pt a little more confused today than yesterday.    Assessment & Plan: Acute encephalopathy likely from UTI.   - Initially clearing.  -Much more awake and alert yesterday 8/3.  Today she is a little more confused.  Oriented to person but unable to remember her birthdate and thinks she is at the bank.  Can tell me the name of her daughter. - Urine cultures growing > 100K colonies of E. coli.  Awaiting speciation. -Also now with  sinus tachycardia and frequent PACs that started midmorning, see below. - Of note her ammonia is increased.  Unclear cause of this as LFTs are normal.  Starting patient on lactulose  to help with this.  Sinus tachycardia: - Started late AM -EKG showed sinus tachycardia as well as right bundle branch block/bifascicular block  - Troponin is pending. -Patient denies any chest pain or abdominal pain. -Does appear little bit more confused today than yesterday.  Chest x-ray also ordered. - She has been on her home amlodipine , losartan , metoprolol  50 mg p.o. twice daily.  Blood pressure has been creeping upwards despite this..  -Increased amlodipine  yesterday 8/3.   - Today I have increased her metoprolol  up to extra 25 mg dose this a.m. as we await troponin -Of note the plan is for her to go home with hospice so I do not think she would be candidate for vascular/cardiac procedures inpatient but will clarify with her daughter once CXR and troponins are back as she is not currently comfort care.  Dementia  - Lived in nursing home for 3 years, then came home about 1.5 years ago.  - Notable decline in overall cognition per daughter since being placed in the nursing home - on Aricept . - Plan will be for hospice to follow outpatient after discharge to SNF.  History of depression  - on Depakote , Risperdal  Zoloft  and trazodone .  Check Depakote  levels and ammonia levels.  And on Klonopin  for anxiety. -  Valproic acid  level is low.  May need to increase defer to outpatient --noting that she is going home to SNF with hospice  Hypertension uncontrolled  - See above regarding cardiac medications  Diabetes mellitus type 2  - uncontrolled.   - check A1c.  Patient takes Lantus  18 units but patient's daughter has decreased it to 16 units due to poor oral intake - On admit and to be safe she was further decreased to 12 units Lantus  - Increased to 16 units 8/2 and increasing to 18 units today with sliding scale  coverage as CBGs have been mostly elevated  Chronic kidney disease stage II  - creatinine around baseline.  Actually normal now.  Hypokalemia: - Replaced today  Chronic anemia  - likely from renal disease follow CBC.   - Slowly downtrending  Chronic diarrhea  - Stable currently.    DVT prophylaxis:  enoxaparin  (LOVENOX ) injection 40 mg Start: 03/27/24 1600  Code Status:   Code Status: Full Code Family Communication: Spoke with daughter and all questions answered Level of care: Telemetry Medical Status is: Observation   Subjective: Patient awake this a.m.  However she appears more confused today compared to yesterday.  Does know her name but does not know that she is in the hospital, thinks she is at the bank.  Does remember her daughter and asking where her daughter is.  Denies any chest pain or shortness of breath or abdominal pain.  Objective: Vitals:   03/29/24 0013 03/29/24 0014 03/29/24 0554 03/29/24 0729  BP: (!) 187/68  (!) 180/89 (!) 171/85  Pulse: 77  85 98  Resp: 18  18 18   Temp: 98.3 F (36.8 C)  98.4 F (36.9 C) 98.1 F (36.7 C)  TempSrc:   Oral Oral  SpO2:  95% 96% 97%  Weight:      Height:        Intake/Output Summary (Last 24 hours) at 03/29/2024 0904 Last data filed at 03/29/2024 0600 Gross per 24 hour  Intake 500 ml  Output 1600 ml  Net -1100 ml   Filed Weights   03/27/24 0140  Weight: 76.1 kg   Body mass index is 27.08 kg/m.  Gen: 89 y.o. female in no apparent distress.  Nontoxic Pulm: Non-labored breathing.  Some rhonchi anterior chest CV: Regular rate and rhythm on my initial examination, became tachycardic later in the morning. GI: Abdomen soft, non-tender, non-distended Ext: Warm, no deformities Skin: No rashes, lesions  Neuro: Alert but oriented to person only.  Moving all limbs symmetrically.  Able to follow commands such as squeezing hands.  Can answer direct questions.   I have personally reviewed the following labs and  images: CBC: Recent Labs  Lab 03/26/24 2005 03/27/24 0957 03/28/24 0919 03/29/24 0437  WBC 11.5* 8.3 6.8 5.9  NEUTROABS 9.2* 6.5  --   --   HGB 10.9* 9.8* 9.7* 10.0*  HCT 33.3* 30.1* 29.3* 31.2*  MCV 86.3 86.7 86.7 87.4  PLT 205 181 189 210   BMP &GFR Recent Labs  Lab 03/26/24 2005 03/27/24 0957 03/28/24 0919 03/29/24 0437  NA 136 133* 136 138  K 3.4* 3.6 3.4* 3.9  CL 102 101 104 106  CO2 23 23 25 24   GLUCOSE 227* 302* 142* 227*  BUN 14 13 10 9   CREATININE 1.01* 0.84 0.91 0.89  CALCIUM  8.4* 8.1* 8.1* 8.4*   Estimated Creatinine Clearance: 43.8 mL/min (by C-G formula based on SCr of 0.89 mg/dL). Liver & Pancreas: Recent Labs  Lab  03/26/24 2005 03/27/24 0957  AST 32 21  ALT 26 21  ALKPHOS 70 60  BILITOT 1.1 0.4  PROT 6.0* 5.4*  ALBUMIN  2.3* 1.9*   No results for input(s): LIPASE, AMYLASE in the last 168 hours. Recent Labs  Lab 03/27/24 0957  AMMONIA 64*   Diabetic: No results for input(s): HGBA1C in the last 72 hours. Recent Labs  Lab 03/28/24 0823 03/28/24 1214 03/28/24 1637 03/28/24 2006 03/29/24 0728  GLUCAP 129* 218* 163* 217* 237*   Cardiac Enzymes: No results for input(s): CKTOTAL, CKMB, CKMBINDEX, TROPONINI in the last 168 hours. No results for input(s): PROBNP in the last 8760 hours. Coagulation Profile: Recent Labs  Lab 03/26/24 2005  INR 1.2   Thyroid  Function Tests: Recent Labs    03/27/24 0957  TSH 1.924   Lipid Profile: No results for input(s): CHOL, HDL, LDLCALC, TRIG, CHOLHDL, LDLDIRECT in the last 72 hours. Anemia Panel: Recent Labs    03/27/24 0957  VITAMINB12 550  FERRITIN 144  TIBC 172*  IRON  10*  RETICCTPCT 1.5   Urine analysis:    Component Value Date/Time   COLORURINE YELLOW 03/26/2024 2035   APPEARANCEUR TURBID (A) 03/26/2024 2035   LABSPEC 1.014 03/26/2024 2035   PHURINE 5.0 03/26/2024 2035   GLUCOSEU NEGATIVE 03/26/2024 2035   HGBUR SMALL (A) 03/26/2024 2035    BILIRUBINUR NEGATIVE 03/26/2024 2035   KETONESUR NEGATIVE 03/26/2024 2035   PROTEINUR 100 (A) 03/26/2024 2035   UROBILINOGEN 0.2 09/24/2012 2220   NITRITE NEGATIVE 03/26/2024 2035   LEUKOCYTESUR MODERATE (A) 03/26/2024 2035   Sepsis Labs: Invalid input(s): PROCALCITONIN, LACTICIDVEN  Microbiology: Recent Results (from the past 240 hours)  Culture, blood (Routine x 2)     Status: None (Preliminary result)   Collection Time: 03/26/24  8:32 PM   Specimen: BLOOD RIGHT ARM  Result Value Ref Range Status   Specimen Description BLOOD RIGHT ARM  Final   Special Requests   Final    BOTTLES DRAWN AEROBIC AND ANAEROBIC Blood Culture results may not be optimal due to an inadequate volume of blood received in culture bottles   Culture   Final    NO GROWTH 3 DAYS Performed at [facility], [address], [place], [place] [address]    Report Status PENDING  Incomplete  Culture, blood (Routine x 2)     Status: None (Preliminary result)   Collection Time: 03/26/24  8:35 PM   Specimen: BLOOD  Result Value Ref Range Status   Specimen Description BLOOD SITE NOT SPECIFIED  Final   Special Requests   Final    BOTTLES DRAWN AEROBIC AND ANAEROBIC Blood Culture adequate volume   Culture   Final    NO GROWTH 3 DAYS Performed at [facility], [address] [place], [place] [address]    Report Status PENDING  Incomplete  Resp panel by RT-PCR (RSV, Flu A&B, Covid) Anterior Nasal Swab     Status: None   Collection Time: 03/26/24  8:41 PM   Specimen: Anterior Nasal Swab  Result Value Ref Range Status   SARS Coronavirus 2 by RT PCR NEGATIVE NEGATIVE Final   Influenza A by PCR NEGATIVE NEGATIVE Final   Influenza B by PCR NEGATIVE NEGATIVE Final    Comment: (NOTE) The Xpert Xpress SARS-CoV-2/FLU/RSV plus assay is intended as an aid in the diagnosis of influenza from Nasopharyngeal swab specimens and should not be used as a sole basis for treatment. Nasal washings and aspirates are  unacceptable for  Xpert Xpress SARS-CoV-2/FLU/RSV testing.  Fact Sheet for Patients: BloggerCourse.com  Fact Sheet for Healthcare Providers: SeriousBroker.it  This test is not yet approved or cleared by the [place]  FDA and has been authorized for detection and/or diagnosis of SARS-CoV-2 by FDA under an Emergency Use Authorization (EUA). This EUA will remain in effect (meaning this test can be used) for the duration of the COVID-19 declaration under Section 564(b)(1) of the Act, 21 U.S.C. section 360bbb-3(b)(1), unless the authorization is terminated or revoked.     Resp Syncytial Virus by PCR NEGATIVE NEGATIVE Final    Comment: (NOTE) Fact Sheet for Patients: BloggerCourse.com  Fact Sheet for Healthcare Providers: SeriousBroker.it  This test is not yet approved or cleared by the [place]  FDA and has been authorized for detection and/or diagnosis of SARS-CoV-2 by FDA under an Emergency Use Authorization (EUA). This EUA will remain in effect (meaning this test can be used) for the duration of the COVID-19 declaration under Section 564(b)(1) of the Act, 21 U.S.C. section 360bbb-3(b)(1), unless the authorization is terminated or revoked.  Performed at [facility], [address] [place], [place] [address]   Urine Culture (for pregnant, neutropenic or urologic patients or patients with an indwelling urinary catheter)     Status: Abnormal (Preliminary result)   Collection Time: 03/27/24  8:01 PM   Specimen: Urine, Catheterized  Result Value Ref Range Status   Specimen Description URINE, CATHETERIZED  Final   Special Requests NONE  Final   Culture (A)  Final    >=100,000 COLONIES/mL ESCHERICHIA COLI SUSCEPTIBILITIES TO FOLLOW CULTURE REINCUBATED FOR BETTER GROWTH Performed at [facility], [address], [place], [place] [address]    Report Status  PENDING  Incomplete    Radiology Studies: No results found.   Scheduled Meds:  amLODipine   10 mg Oral Daily   atorvastatin   40 mg Oral Daily   clonazePAM   0.5 mg Oral Daily   divalproex   250 mg Oral BID   donepezil   5 mg Oral QPM   enoxaparin  (LOVENOX ) injection  40 mg Subcutaneous Q24H   insulin  aspart  0-5 Units Subcutaneous QHS   insulin  aspart  0-9 Units Subcutaneous TID WC   insulin  glargine-yfgn  18 Units Subcutaneous QHS   losartan   50 mg Oral Daily   melatonin  3 mg Oral QHS   metoprolol  tartrate  50 mg Oral BID   risperiDONE   0.5 mg Oral Daily   sertraline   50 mg Oral Daily   traZODone   50 mg Oral QHS   Continuous Infusions:  cefTRIAXone  (ROCEPHIN )  IV 1 g (03/28/24 2209)     LOS: 2 days   35 minutes with more than 50% spent in reviewing records, counseling patient/family and coordinating care.  Reyes VEAR Gaw, MD [facility] [email or web address] 03/29/2024, 9:04 AM

## 2024-03-29 NOTE — Plan of Care (Signed)

## 2024-03-29 NOTE — TOC Progression Note (Addendum)
 Transition of Care [facility]) - Progression Note    Patient Details  Name: Kristen Bowman MRN: [id number] Date of Birth: 1931/07/03  Transition of Care [facility]) CM/SW Contact  Kyce Ging A [place], LCSW Phone Number: 03/29/2024, 2:29 PM  Clinical Narrative:     Update 1551 CSW was contacted with pt's daughter, provided medicare.gov rating bed offers. She said that she would get back to me once decision has been made regarding facility.   CSW contacted pt's daughter to provide bed offers for rehab. Left VM with contact information to reach CSW.   CSW will continue to follow.  Expected Discharge Plan: Skilled Nursing Facility Barriers to Discharge: Continued Medical Work up, SNF Pending bed offer, English as a second language teacher               Expected Discharge Plan and Services     Post Acute Care Choice: Skilled Nursing Facility Living arrangements for the past 2 months: Single Family Home                                       Social Drivers of Health (SDOH) Interventions SDOH Screenings   Food Insecurity: No Food Insecurity (03/27/2024)  Housing: Low Risk  (03/27/2024)  Transportation Needs: No Transportation Needs (03/27/2024)  Utilities: Not At Risk (03/27/2024)  Social Connections: Socially Isolated (03/27/2024)  Tobacco Use: Low Risk  (03/27/2024)    Readmission Risk Interventions     No data to display

## 2024-03-30 DIAGNOSIS — G9340 Encephalopathy, unspecified: Secondary | ICD-10-CM | POA: Diagnosis not present

## 2024-03-30 LAB — CBC
HCT: 31.9 % — ABNORMAL LOW (ref 36.0–46.0)
Hemoglobin: 10.4 g/dL — ABNORMAL LOW (ref 12.0–15.0)
MCH: 28.1 pg (ref 26.0–34.0)
MCHC: 32.6 g/dL (ref 30.0–36.0)
MCV: 86.2 fL (ref 80.0–100.0)
Platelets: 221 K/uL (ref 150–400)
RBC: 3.7 MIL/uL — ABNORMAL LOW (ref 3.87–5.11)
RDW: 13.5 % (ref 11.5–15.5)
WBC: 5.9 K/uL (ref 4.0–10.5)
nRBC: 0 % (ref 0.0–0.2)

## 2024-03-30 LAB — URINE CULTURE: Culture: >=100000 — AB

## 2024-03-30 LAB — GLUCOSE, CAPILLARY
Glucose-Capillary: 285 mg/dL — ABNORMAL HIGH (ref 70–99)
Glucose-Capillary: 267 mg/dL — ABNORMAL HIGH (ref 70–99)
Glucose-Capillary: 252 mg/dL — ABNORMAL HIGH (ref 70–99)
Glucose-Capillary: 213 mg/dL — ABNORMAL HIGH (ref 70–99)

## 2024-03-30 LAB — BASIC METABOLIC PANEL WITH GFR
Anion gap: 8 (ref 5–15)
BUN: 7 mg/dL — ABNORMAL LOW (ref 8–23)
CO2: 28 mmol/L (ref 22–32)
Calcium: 8.4 mg/dL — ABNORMAL LOW (ref 8.9–10.3)
Chloride: 102 mmol/L (ref 98–111)
Creatinine, Ser: 0.82 mg/dL (ref 0.44–1.00)
GFR, Estimated: >60 mL/min (ref >60)
Glucose, Bld: 291 mg/dL — ABNORMAL HIGH (ref 70–99)
Potassium: 3.5 mmol/L (ref 3.5–5.1)
Sodium: 138 mmol/L (ref 135–145)

## 2024-03-30 MED ORDER — CIPROFLOXACIN IN D5W 200 MG/100ML IV SOLN: 200.0000 mg | Freq: Two times a day (BID) | INTRAVENOUS | Status: DC

## 2024-03-30 MED ORDER — SULFAMETHOXAZOLE-TRIMETHOPRIM 800-160 MG PO TABS
1.0000 | ORAL_TABLET | Freq: Two times a day (BID) | ORAL | Status: DC
  Administered 2024-03-30 – 2024-04-01 (×5): 1 via ORAL
  Filled 2024-03-30 (×5): qty 1

## 2024-03-30 MED ORDER — INSULIN GLARGINE-YFGN 100 UNIT/ML ~~LOC~~ SOLN
20.0000 [IU] | Freq: Every day | SUBCUTANEOUS | Status: DC
  Administered 2024-03-30 – 2024-03-31 (×2): 20 [IU] via SUBCUTANEOUS
  Filled 2024-03-30 (×3): qty 0.2

## 2024-03-30 NOTE — Inpatient Diabetes Management (Signed)
 Inpatient Diabetes Program Recommendations  AACE/ADA: New Consensus Statement on Inpatient Glycemic Control (2015)  Target Ranges:  Prepandial:   less than 140 mg/dL      Peak postprandial:   less than 180 mg/dL (1-2 hours)      Critically ill patients:  140 - 180 mg/dL    Latest Reference Range & Units 03/29/24 07:28 03/29/24 12:04 03/29/24 16:15 03/29/24 20:20 03/29/24 20:21  Glucose-Capillary 70 - 99 mg/dL 762 (H)  3 units Novolog   214 (H)  3 units Novolog   255 (H)  3 units Novolog   317 (H) 347 (H)  4 units Novolog   18 units Semglee   (H): Data is abnormally high  Latest Reference Range & Units 03/30/24 07:49  Glucose-Capillary 70 - 99 mg/dL 714 (H)  (H): Data is abnormally high     Home DM Meds: Humalog  3-8 units TID per SSI       Lantus  18 units at HS       Metformin  750 mg daily  Current Orders: Semglee  18 units at HS      Novolog  Sensitive Correction Scale/ SSI (0-9 units) TID AC + HS    MD- Please consider:  1. Increase Semglee  to 20 units at HS  2. If pt eating well, may consider adding Novolog  Meal Coverage:  Novolog  3 units TID with meals HOLD if pt NPO HOLD if pt eats <50% meals    --Will follow patient during hospitalization--  Adina Rudolpho Arrow RN, MSN, CDCES Diabetes Coordinator Inpatient Glycemic Control Team Team Pager: [phone] (8a-5p)

## 2024-03-30 NOTE — TOC Progression Note (Addendum)
 Transition of Care [facility]) - Progression Note    Patient Details  Name: Kristen Bowman MRN: [id number] Date of Birth: August 03, 1931  Transition of Care [facility]) CM/SW Contact  Leodan Bolyard A [place], LCSW Phone Number: 03/30/2024, 11:46 AM  Clinical Narrative:     Update 1540 CSW reached out to pt's daughter [name] on bed choice. No answer, CSW left VM with contact information to reach out to CSW.    CSW spoke with pt's daughter [name], was provided updated bed offers via secure email.   CSW will follow up on decision and proceed with next steps for SNF placement.   CSW will continue to follow.   Expected Discharge Plan: Skilled Nursing Facility Barriers to Discharge: Continued Medical Work up, SNF Pending bed offer, English as a second language teacher               Expected Discharge Plan and Services     Post Acute Care Choice: Skilled Nursing Facility Living arrangements for the past 2 months: Single Family Home                                       Social Drivers of Health (SDOH) Interventions SDOH Screenings   Food Insecurity: No Food Insecurity (03/27/2024)  Housing: Low Risk  (03/27/2024)  Transportation Needs: No Transportation Needs (03/27/2024)  Utilities: Not At Risk (03/27/2024)  Social Connections: Socially Isolated (03/27/2024)  Tobacco Use: Low Risk  (03/27/2024)    Readmission Risk Interventions     No data to display

## 2024-03-30 NOTE — Progress Notes (Signed)
 Physical Therapy Treatment Patient Details Name: Kristen Bowman MRN: [id number] DOB: 1930-10-14 Today's Date: 03/30/2024   History of Present Illness The pt is a 89yo female presenting 8/1 with AMS. Work up revealed UTI. PMH includes: dementia, anemia, DM II, osteoarthritis, HLD, CKD II HTN, IBS, and afib.    PT Comments  Pt received in supine and noted to have BM requiring total assist with pericare. Pt able to complete bed mobility and transfers with up to mod A +2 due to weakness and posterior bias. Pt able to achieve CGA sitting EOB and perform BLE exercise without LOB. Pt able to stand x2 and take a few shuffled steps towards HOB. Pt with improved command following once sitting EOB, but does not verbalize throughout session. Pt continues to benefit from PT services to progress toward functional mobility goals.    If plan is discharge home, recommend the following: Two people to help with walking and/or transfers;Two people to help with bathing/dressing/bathroom;Assistance with cooking/housework;Assistance with feeding;Direct supervision/assist for medications management;Direct supervision/assist for financial management;Assist for transportation;Help with stairs or ramp for entrance;Supervision due to cognitive status   Can travel by private vehicle     No  Equipment Recommendations  BSC/3in1;Wheelchair (measurements PT);Wheelchair cushion (measurements PT);Hospital bed;Hoyer lift    Recommendations for Other Services       Precautions / Restrictions Precautions Precautions: Fall Recall of Precautions/Restrictions: Impaired Restrictions Weight Bearing Restrictions Per Provider Order: No     Mobility  Bed Mobility Overal bed mobility: Needs Assistance Bed Mobility: Rolling, Supine to Sit, Sit to Supine Rolling: Max assist   Supine to sit: Max assist, +2 for physical assistance Sit to supine: Mod assist, +2 for physical assistance   General bed mobility comments: Rolling for  pericare with max A and cues for use of bedrails. max A +2 for BLE advancement to EOB and trunk elevation due to decreased command following. Good initiation to return to supine with mod A for BLE elevation to EOB and guiding trunk    Transfers Overall transfer level: Needs assistance Equipment used: 2 person hand held assist Transfers: Sit to/from Stand Sit to Stand: Mod assist, +2 physical assistance           General transfer comment: STS from EOB x2 with pt demonstrating posterior lean and B knees slightly flexed throughout. Pt able to take a few shuffled lateral steps towards [facility] with mod A +2 for balance, but unable to step backwards    Ambulation/Gait               General Gait Details: unable to progress   Stairs             Wheelchair Mobility     Tilt Bed    Modified Rankin (Stroke Patients Only)       Balance Overall balance assessment: Needs assistance Sitting-balance support: Bilateral upper extremity supported, Feet supported Sitting balance-Leahy Scale: Poor Sitting balance - Comments: Posterior lean, but pt able to achieve CGA sitting EOB Postural control: Posterior lean Standing balance support: Bilateral upper extremity supported, During functional activity, Reliant on assistive device for balance Standing balance-Leahy Scale: Poor Standing balance comment: reliant on mod A +2 for stability                            Communication Communication Communication: Impaired Factors Affecting Communication: Difficulty expressing self  Cognition Arousal: Alert Behavior During Therapy: WFL for tasks assessed/performed   PT -  Cognitive impairments: History of cognitive impairments, Orientation, Awareness, Memory, Attention, Initiation, Sequencing, Problem solving, Safety/Judgement                       PT - Cognition Comments: Pt smiling throughout session and following one step commands fairly consistently once EOB with  increased cues at times Following commands: Impaired Following commands impaired: Follows one step commands with increased time    Cueing Cueing Techniques: Verbal cues, Gestural cues, Tactile cues, Visual cues  Exercises General Exercises - Lower Extremity Long Arc Quad: AROM, Seated, Both, 5 reps    General Comments        Pertinent Vitals/Pain Pain Assessment Pain Assessment: Faces Faces Pain Scale: No hurt     PT Goals (current goals can now be found in the care plan section) Acute Rehab PT Goals Patient Stated Goal: none stated PT Goal Formulation: Patient unable to participate in goal setting Time For Goal Achievement: 04/10/24 Progress towards PT goals: Progressing toward goals    Frequency    Min 2X/week       AM-PAC PT 6 Clicks Mobility   Outcome Measure  Help needed turning from your back to your side while in a flat bed without using bedrails?: A Lot Help needed moving from lying on your back to sitting on the side of a flat bed without using bedrails?: A Lot Help needed moving to and from a bed to a chair (including a wheelchair)?: Total Help needed standing up from a chair using your arms (e.g., wheelchair or bedside chair)?: Total Help needed to walk in hospital room?: Total Help needed climbing 3-5 steps with a railing? : Total 6 Click Score: 8    End of Session Equipment Utilized During Treatment: Gait belt Activity Tolerance: Patient tolerated treatment well Patient left: in bed;with call bell/phone within reach;with bed alarm set Nurse Communication: Mobility status PT Visit Diagnosis: Unsteadiness on feet (R26.81);Other abnormalities of gait and mobility (R26.89);Muscle weakness (generalized) (M62.81)     Time: 8486-8469 PT Time Calculation (min) (ACUTE ONLY): 17 min  Charges:    $Therapeutic Activity: 8-22 mins PT General Charges $$ ACUTE PT VISIT: 1 Visit                    Darryle George, PTA Acute Rehabilitation Services Secure  Chat Preferred  [phone]    Darryle George 03/30/2024, 4:08 PM

## 2024-03-30 NOTE — Progress Notes (Signed)
 PROGRESS NOTE Kristen Bowman  FMW:[id number] DOB: 03-03-1931 DOA: 03/26/2024 PCP: Valentin Skates, DO  Brief Narrative/Hospital Course: 89 y.o. female with history of dementia, diabetes mellitus type 2, hypertension, chronic kidney disease stage II, anemia, depression was brought to the ER after patient was feeling increasingly weak poor appetite and confused AND worsening for chronic diarrhea over the last 2 to 3 days PTA. At baseline >Ambulates with help and has not been doing it for last 2 to 3 days and has been been eating less. In the ED patient had fever to 101 F.  UA is concerning for UTI. CT of the head>> NAD. Cultures were sent and started on empiric antibiotics admitted for acute encephalopathy suspect likely from UTI. Labs show anemia and mild renal failure which are chronic Mental status slowly improving. With her dementia notable decline in overall cognition planning for discharge to SNF and hospice to follow-up  Subjective: Seen and examined today She is alert awake oriented to self, on room air\follows only some commands Overnight afebrile BP stable to high side Labs reviewed from 8/5 AM overall unchanged, hyperglycemia in 280s-300s  Assessment and plan:  E. coli UTI Acute encephalopathy likely from E. coli UTI Dementia at baseline-with significant decline Depression : CT head no acute finding.  She is oriented to self unable to remember location birthdate. Workup with E. coli UTI-sensitive to Cipro  Macrobid  Bactrim  Zosyn> discussed with pharmacy will transition to oral Bactrim  Mentation slowly improving continue supportive care delirium precaution fall precaution. With her significant decline plan is for discharge to SNF and hospice to follow-up.  Continue with Aricept . Of note her ammonia is increased.  Unclear cause of this as LFTs are normal.  Lactulose  Continue her home Depakote  Risperdal  Zoloft  and trazodone  and Klonopin  for anxiety-Valproic acid  level is low.  May need  to increase defer to outpatient noting that she is going SNF, with hospice..  Hypertension Sinus tachycardia New onset RBBB: Likely multifactorial with UTI.  Continue metoprolol  cardiology consulted 8/5 no further recommendation.  On amlodipine  10, losartan , monitor blood pressure.  Troponin was borderline at 21  T2DM W/ uncontrolled hyperglycemia: A1c pending, Lantus  was increased continue SSI.  Consult given poor oral intake Recent Labs  Lab 03/29/24 1204 03/29/24 1615 03/29/24 2020 03/29/24 2021 03/30/24 0749  GLUCAP 214* 255* 317* 347* 285*    Chronic kidney disease stage II Hypokalemia: Stable at baseline  HLD: Continue statin  Chronic anemia  likely from renal disease follow CBC.     Chronic diarrhea  Stable currently.  Monitor on lactulose   Mobility: PT Orders: Active PT Follow up Rec: Skilled Nursing-Short Term Rehab (<3 Hours/Day)03/27/2024 1100   DVT prophylaxis: enoxaparin  (LOVENOX ) injection 40 mg Start: 03/27/24 1600 Code Status:   Code Status: Full Code Family Communication: plan of care discussed with patient at bedside. Patient status is: Remains hospitalized because of severity of illness Level of care: Telemetry Medical   Dispo: The patient is from: HOME            Anticipated disposition: Awaiting SNF Objective: Vitals last 24 hrs: Vitals:   03/30/24 0020 03/30/24 0340 03/30/24 0342 03/30/24 0751  BP: (!) 180/67 (!) 160/54 (!) 163/55 (!) 187/65  Pulse: 77 67 70 72  Resp: 18 16  18   Temp: 97.9 F (36.6 C) 99.8 F (37.7 C) 98.9 F (37.2 C) 98.2 F (36.8 C)  TempSrc:  Oral Oral Oral  SpO2: 96% 100% 97% 96%  Weight:      Height:  Physical Examination: General exam: alert awake, orientedX1, WEAK, FRAIL HEENT:Oral mucosa moist, Ear/Nose WNL grossly Respiratory system: Bilaterally clear BS,no use of accessory muscle Cardiovascular system: S1 & S2 +, No JVD. Gastrointestinal system: Abdomen soft,NT,ND, BS+ Nervous System: Alert, awake,  moving all extremities,and following commands. Extremities: LE edema neg, distal extremities warm.  Skin: No rashes,no icterus. MSK: Normal muscle bulk,tone, power   Medications reviewed:  Scheduled Meds:  amLODipine   10 mg Oral Daily   atorvastatin   40 mg Oral Daily   clonazePAM   0.5 mg Oral Daily   divalproex   250 mg Oral BID   donepezil   5 mg Oral QPM   enoxaparin  (LOVENOX ) injection  40 mg Subcutaneous Q24H   insulin  aspart  0-5 Units Subcutaneous QHS   insulin  aspart  0-9 Units Subcutaneous TID WC   insulin  glargine-yfgn  18 Units Subcutaneous QHS   lactulose   20 g Oral BID   losartan   50 mg Oral Daily   melatonin  3 mg Oral QHS   metoprolol  tartrate  75 mg Oral BID   risperiDONE   0.5 mg Oral Daily   sertraline   50 mg Oral Daily   sulfamethoxazole -trimethoprim   1 tablet Oral Q12H   traZODone   50 mg Oral QHS   Continuous Infusions:   Diet: Diet Order             Diet heart healthy/carb modified Room service appropriate? Yes; Fluid consistency: Thin  Diet effective now                    Data Reviewed: I have personally reviewed following labs and imaging studies ( see epic result tab) CBC: Recent Labs  Lab 03/26/24 2005 03/27/24 0957 03/28/24 0919 03/29/24 0437 03/30/24 0431  WBC 11.5* 8.3 6.8 5.9 5.9  NEUTROABS 9.2* 6.5  --   --   --   HGB 10.9* 9.8* 9.7* 10.0* 10.4*  HCT 33.3* 30.1* 29.3* 31.2* 31.9*  MCV 86.3 86.7 86.7 87.4 86.2  PLT 205 181 189 210 221   CMP: Recent Labs  Lab 03/26/24 2005 03/27/24 0957 03/28/24 0919 03/29/24 0437 03/30/24 0431  NA 136 133* 136 138 138  K 3.4* 3.6 3.4* 3.9 3.5  CL 102 101 104 106 102  CO2 23 23 25 24 28   GLUCOSE 227* 302* 142* 227* 291*  BUN 14 13 10 9  7*  CREATININE 1.01* 0.84 0.91 0.89 0.82  CALCIUM  8.4* 8.1* 8.1* 8.4* 8.4*   GFR: Estimated Creatinine Clearance: 47.5 mL/min (by C-G formula based on SCr of 0.82 mg/dL). Recent Labs  Lab 03/26/24 2005 03/27/24 0957  AST 32 21  ALT 26 21  ALKPHOS 70  60  BILITOT 1.1 0.4  PROT 6.0* 5.4*  ALBUMIN  2.3* 1.9*   No results for input(s): LIPASE, AMYLASE in the last 168 hours.  Recent Labs  Lab 03/27/24 0957  AMMONIA 64*   Coagulation Profile:  Recent Labs  Lab 03/26/24 2005  INR 1.2   Unresulted Labs (From admission, onward)     Start     Ordered   04/03/24 0500  Creatinine, serum  (enoxaparin  (LOVENOX )    CrCl >/= 30 ml/min)  Weekly,   R     Comments: while on enoxaparin  therapy    03/27/24 0322   03/27/24 0321  CBC  (enoxaparin  (LOVENOX )    CrCl >/= 30 ml/min)  Once,   R       Comments: Baseline for enoxaparin  therapy IF NOT ALREADY DRAWN.  Notify MD if  PLT < 100 K.    03/27/24 0322           Antimicrobials/Microbiology: Anti-infectives (From admission, onward)    Start     Dose/Rate Route Frequency Ordered Stop   03/30/24 1000  sulfamethoxazole -trimethoprim  (BACTRIM  DS) 800-160 MG per tablet 1 tablet        1 tablet Oral Every 12 hours 03/30/24 0845 04/02/24 0959   03/30/24 0915  ciprofloxacin  (CIPRO ) IVPB 200 mg  Status:  Discontinued        200 mg 100 mL/hr over 60 Minutes Intravenous Every 12 hours 03/30/24 0819 03/30/24 0844   03/27/24 2200  cefTRIAXone  (ROCEPHIN ) 1 g in sodium chloride  0.9 % 100 mL IVPB  Status:  Discontinued        1 g 200 mL/hr over 30 Minutes Intravenous Every 24 hours 03/27/24 0322 03/30/24 0819   03/26/24 2045  cefTRIAXone  (ROCEPHIN ) 2 g in sodium chloride  0.9 % 100 mL IVPB        2 g 200 mL/hr over 30 Minutes Intravenous Once 03/26/24 2041 03/27/24 0000         Component Value Date/Time   SDES URINE, CATHETERIZED 03/27/2024 2001   SPECREQUEST  03/27/2024 2001    NONE Performed at [facility], [address], [place], [place] [address]    CULT  03/27/2024 2001    Two isolates with different morphologies were identified as the same organism.The most resistant organism was reported. >=100,000 COLONIES/mL ESCHERICHIA COLI Confirmed Extended Spectrum Beta-Lactamase  Producer (ESBL).  In bloodstream infections from ESBL organisms, carbapenems are preferred over piperacillin/tazobactam. They are shown to have a lower risk of mortality.    REPTSTATUS 03/30/2024 FINAL 03/27/2024 2001    Procedures:    Mennie LAMY, MD [facility] 03/30/2024, 11:34 AM

## 2024-03-30 NOTE — Plan of Care (Signed)

## 2024-03-30 NOTE — Progress Notes (Signed)
 [facility] Liaison Note   This patient was scheduled for hospice admission with [facility] 8.3.2025.   It appears SNF has been recommended. We can follow with outpatient palliative when facility is determined with referral.   Hospital Liaison Team will follow for discharge disposition.   Please feel free to call with any questions or concerns.   Thank you, [facility] Liaison [phone]

## 2024-03-30 NOTE — Hospital Course (Addendum)
 89 y.o. female with history of dementia, diabetes mellitus type 2, hypertension, chronic kidney disease stage II, anemia, depression was brought to the ER after patient was feeling increasingly weak poor appetite and confused AND worsening for chronic diarrhea over the last 2 to 3 days PTA. At baseline >Ambulates with help and has not been doing it for last 2 to 3 days and has been been eating less. In the ED patient had fever to 101 F.  UA is concerning for UTI. CT of the head>> NAD. Cultures were sent and started on empiric antibiotics admitted for acute encephalopathy suspect likely from UTI. Labs show anemia and mild renal failure which are chronic Mental status slowly improving. With her dementia notable decline in overall cognition planning for discharge to SNF and hospice to follow-up  Subjective: Seen and examined today She is alert awake oriented to self, on room air\follows only some commands Overnight afebrile BP stable to high side Labs reviewed from 8/5 AM overall unchanged, hyperglycemia in 280s-300s  Assessment and plan:  E. coli UTI Acute encephalopathy likely from E. coli UTI Dementia at baseline-with significant decline Depression : CT head no acute finding.  She is oriented to self unable to remember location birthdate. Workup with E. coli UTI-sensitive to Cipro  Macrobid  Bactrim  Zosyn> discussed with pharmacy will transition to oral Bactrim  Mentation slowly improving continue supportive care delirium precaution fall precaution. With her significant decline plan is for discharge to SNF and hospice to follow-up.  Continue with Aricept . Of note her ammonia is increased.  Unclear cause of this as LFTs are normal.  Lactulose  Continue her home Depakote  Risperdal  Zoloft  and trazodone  and Klonopin  for anxiety-Valproic acid  level is low.  May need to increase defer to outpatient noting that she is going SNF, with hospice..  Hypertension Sinus tachycardia New onset  RBBB: Likely multifactorial with UTI.  Continue metoprolol  cardiology consulted 8/5 no further recommendation.  On amlodipine  10, losartan , monitor blood pressure.  Troponin was borderline at 21  T2DM W/ uncontrolled hyperglycemia: A1c pending, Lantus  was increased continue SSI.  Consult given poor oral intake Recent Labs  Lab 03/29/24 1204 03/29/24 1615 03/29/24 2020 03/29/24 2021 03/30/24 0749  GLUCAP 214* 255* 317* 347* 285*    Chronic kidney disease stage II Hypokalemia: Stable at baseline  HLD: Continue statin  Chronic anemia  likely from renal disease follow CBC.     Chronic diarrhea  Stable currently.  Monitor on lactulose   Mobility: PT Orders: Active PT Follow up Rec: Skilled Nursing-Short Term Rehab (<3 Hours/Day)03/27/2024 1100   DVT prophylaxis: enoxaparin  (LOVENOX ) injection 40 mg Start: 03/27/24 1600 Code Status:   Code Status: Full Code Family Communication: plan of care discussed with patient at bedside. Patient status is: Remains hospitalized because of severity of illness Level of care: Telemetry Medical   Dispo: The patient is from: HOME            Anticipated disposition: Awaiting SNF Objective: Vitals last 24 hrs: Vitals:   03/30/24 0020 03/30/24 0340 03/30/24 0342 03/30/24 0751  BP: (!) 180/67 (!) 160/54 (!) 163/55 (!) 187/65  Pulse: 77 67 70 72  Resp: 18 16  18   Temp: 97.9 F (36.6 C) 99.8 F (37.7 C) 98.9 F (37.2 C) 98.2 F (36.8 C)  TempSrc:  Oral Oral Oral  SpO2: 96% 100% 97% 96%  Weight:      Height:        Physical Examination: General exam: alert awake, orientedX1, WEAK, FRAIL HEENT:Oral mucosa moist, Ear/Nose WNL  grossly Respiratory system: Bilaterally clear BS,no use of accessory muscle Cardiovascular system: S1 & S2 +, No JVD. Gastrointestinal system: Abdomen soft,NT,ND, BS+ Nervous System: Alert, awake, moving all extremities,and following commands. Extremities: LE edema neg, distal extremities warm.  Skin: No rashes,no  icterus. MSK: Normal muscle bulk,tone, power   Medications reviewed:  Scheduled Meds:  amLODipine   10 mg Oral Daily   atorvastatin   40 mg Oral Daily   clonazePAM   0.5 mg Oral Daily   divalproex   250 mg Oral BID   donepezil   5 mg Oral QPM   enoxaparin  (LOVENOX ) injection  40 mg Subcutaneous Q24H   insulin  aspart  0-5 Units Subcutaneous QHS   insulin  aspart  0-9 Units Subcutaneous TID WC   insulin  glargine-yfgn  18 Units Subcutaneous QHS   lactulose   20 g Oral BID   losartan   50 mg Oral Daily   melatonin  3 mg Oral QHS   metoprolol  tartrate  75 mg Oral BID   risperiDONE   0.5 mg Oral Daily   sertraline   50 mg Oral Daily   sulfamethoxazole -trimethoprim   1 tablet Oral Q12H   traZODone   50 mg Oral QHS   Continuous Infusions:   Diet: Diet Order             Diet heart healthy/carb modified Room service appropriate? Yes; Fluid consistency: Thin  Diet effective now

## 2024-03-31 DIAGNOSIS — G9340 Encephalopathy, unspecified: Secondary | ICD-10-CM | POA: Diagnosis not present

## 2024-03-31 LAB — GLUCOSE, CAPILLARY
Glucose-Capillary: 200 mg/dL — ABNORMAL HIGH (ref 70–99)
Glucose-Capillary: 191 mg/dL — ABNORMAL HIGH (ref 70–99)
Glucose-Capillary: 166 mg/dL — ABNORMAL HIGH (ref 70–99)
Glucose-Capillary: 235 mg/dL — ABNORMAL HIGH (ref 70–99)

## 2024-03-31 LAB — CULTURE, BLOOD (ROUTINE X 2)
Culture: NO GROWTH
Culture: NO GROWTH
Special Requests: ADEQUATE

## 2024-03-31 MED ORDER — SULFAMETHOXAZOLE-TRIMETHOPRIM 800-160 MG PO TABS: 1.0000 | ORAL_TABLET | Freq: Two times a day (BID) | ORAL | 0 refills | Status: AC

## 2024-03-31 MED ORDER — CLONAZEPAM 0.5 MG PO TABS: 0.5000 mg | ORAL_TABLET | Freq: Every day | ORAL | 0 refills | Status: DC

## 2024-03-31 MED ORDER — LANTUS SOLOSTAR 100 UNIT/ML ~~LOC~~ SOPN: 20.0000 [IU] | PEN_INJECTOR | Freq: Every day | SUBCUTANEOUS | Status: DC

## 2024-03-31 MED ORDER — AMLODIPINE BESYLATE 10 MG PO TABS: 10.0000 mg | ORAL_TABLET | Freq: Every day | ORAL | Status: AC

## 2024-03-31 MED ORDER — METOPROLOL TARTRATE 75 MG PO TABS: 75.0000 mg | ORAL_TABLET | Freq: Two times a day (BID) | ORAL | Status: DC

## 2024-03-31 MED ORDER — LACTULOSE 10 GM/15ML PO SOLN: 20.0000 g | Freq: Two times a day (BID) | ORAL | 0 refills | Status: DC

## 2024-03-31 NOTE — Progress Notes (Signed)
 PROGRESS NOTE Kristen Bowman  FMW:[id number] DOB: Nov 03, 1930 DOA: 03/26/2024 PCP: Valentin Skates, DO  Brief Narrative/Hospital Course: 89 y.o. female with history of dementia, diabetes mellitus type 2, hypertension, chronic kidney disease stage II, anemia, depression was brought to the ER after patient was feeling increasingly weak poor appetite and confused AND worsening for chronic diarrhea over the last 2 to 3 days PTA. At baseline >Ambulates with help and has not been doing it for last 2 to 3 days and has been been eating less. In the ED patient had fever to 101 F.  UA is concerning for UTI. CT of the head>> NAD. Cultures were sent and started on empiric antibiotics admitted for acute encephalopathy suspect likely from UTI. Labs show anemia and mild renal failure which are chronic Mental status slowly improving. With her dementia notable decline in overall cognition planning for discharge to SNF and hospice to follow-up  Subjective: Seen and examined today Patient was just waking up, overnight no acute events BP on higher side on room air Blood sugar in 200 stable  Assessment and plan:  E. coli UTI Acute encephalopathy likely from E. coli UTI Dementia at baseline-with significant decline Depression : CT head no acute finding.  Mentation aware overall stable, does have baseline dementia alert awake and oriented to self Workup with E. coli UTI-sensitive to Cipro  Macrobid  Bactrim  Zosyn> discussed with pharmacy  now on Bactrim  to complete course Continue delirium precaution fall precaution PT OT and rehab With her significant decline plan is for discharge to SNF and hospice to follow-up.  Continue with Aricept . Of note her ammonia is increased.  Unclear cause of this as LFTs are normal.  Lactulose  Continue her home Depakote  Risperdal  Zoloft  and trazodone  and Klonopin  for anxiety-Valproic acid  level is low.  May need to increase defer to outpatient noting that she is going SNF, with  hospice..  Hypertension Sinus tachycardia New onset RBBB: Tachycardia likely multifactorial with UTI.cardiology consulted 8/5 no further recommendation.  On metoprolol , amlodipine  10, losartan , monitor blood pressure.  Troponin was borderline at 21  T2DM W/ uncontrolled hyperglycemia: Lantus  was increased and also on sliding scale blood sugar overall stable  Recent Labs  Lab 03/30/24 0749 03/30/24 1218 03/30/24 1631 03/30/24 1935 03/31/24 0811  GLUCAP 285* 267* 252* 213* 200*    Chronic kidney disease stage II Hypokalemia: Stable at baseline  HLD: Continue statin  Chronic anemia  likely from renal disease follow CBC.     Chronic diarrhea  Stable currently.  Monitor on lactulose   Mobility: PT Orders: Active PT Follow up Rec: Skilled Nursing-Short Term Rehab (<3 Hours/Day)03/30/2024 1608   DVT prophylaxis: enoxaparin  (LOVENOX ) injection 40 mg Start: 03/27/24 1600 Code Status:   Code Status: Full Code Family Communication: plan of care discussed with patient at bedside. Patient status is: Remains hospitalized because of severity of illness Level of care: Telemetry Medical   Dispo: The patient is from: HOME            Anticipated disposition: Awaiting SNF.  Overall stable Objective: Vitals last 24 hrs: Vitals:   03/30/24 1934 03/31/24 0100 03/31/24 0500 03/31/24 0813  BP: (!) 152/55 (!) 173/69 (!) 176/90 (!) 170/46  Pulse: 93 75 81 74  Resp: 17 17  (!) 8  Temp: 100 F (37.8 C) 98.3 F (36.8 C) 98.3 F (36.8 C) 98.8 F (37.1 C)  TempSrc: Oral Oral  Oral  SpO2: 95% 96% 100% 100%  Weight:      Height:  Physical Examination: General exam: alert awake, oriented to self appears weak frail HEENT:Oral mucosa moist, Ear/Nose WNL grossly Respiratory system: B.L clear BS,no use of accessory muscle Cardiovascular system: S1 & S2 +, No JVD. Gastrointestinal system: Abdomen soft,NT,ND, BS+ Nervous System: Alert, awake, moving all extremities,and following  commands. Extremities: LE edema neg, distal extremities warm.  Skin: No rashes,no icterus. MSK: Normal muscle bulk,tone, power   Medications reviewed:  Scheduled Meds:  amLODipine   10 mg Oral Daily   atorvastatin   40 mg Oral Daily   clonazePAM   0.5 mg Oral Daily   divalproex   250 mg Oral BID   donepezil   5 mg Oral QPM   enoxaparin  (LOVENOX ) injection  40 mg Subcutaneous Q24H   insulin  aspart  0-5 Units Subcutaneous QHS   insulin  aspart  0-9 Units Subcutaneous TID WC   insulin  glargine-yfgn  20 Units Subcutaneous QHS   lactulose   20 g Oral BID   losartan   50 mg Oral Daily   melatonin  3 mg Oral QHS   metoprolol  tartrate  75 mg Oral BID   risperiDONE   0.5 mg Oral Daily   sertraline   50 mg Oral Daily   sulfamethoxazole -trimethoprim   1 tablet Oral Q12H   traZODone   50 mg Oral QHS   Continuous Infusions:   Diet: Diet Order             Diet heart healthy/carb modified Room service appropriate? Yes; Fluid consistency: Thin  Diet effective now                    Data Reviewed: I have personally reviewed following labs and imaging studies ( see epic result tab) CBC: Recent Labs  Lab 03/26/24 2005 03/27/24 0957 03/28/24 0919 03/29/24 0437 03/30/24 0431  WBC 11.5* 8.3 6.8 5.9 5.9  NEUTROABS 9.2* 6.5  --   --   --   HGB 10.9* 9.8* 9.7* 10.0* 10.4*  HCT 33.3* 30.1* 29.3* 31.2* 31.9*  MCV 86.3 86.7 86.7 87.4 86.2  PLT 205 181 189 210 221   CMP: Recent Labs  Lab 03/26/24 2005 03/27/24 0957 03/28/24 0919 03/29/24 0437 03/30/24 0431  NA 136 133* 136 138 138  K 3.4* 3.6 3.4* 3.9 3.5  CL 102 101 104 106 102  CO2 23 23 25 24 28   GLUCOSE 227* 302* 142* 227* 291*  BUN 14 13 10 9  7*  CREATININE 1.01* 0.84 0.91 0.89 0.82  CALCIUM  8.4* 8.1* 8.1* 8.4* 8.4*   GFR: Estimated Creatinine Clearance: 47.5 mL/min (by C-G formula based on SCr of 0.82 mg/dL). Recent Labs  Lab 03/26/24 2005 03/27/24 0957  AST 32 21  ALT 26 21  ALKPHOS 70 60  BILITOT 1.1 0.4  PROT 6.0*  5.4*  ALBUMIN  2.3* 1.9*   No results for input(s): LIPASE, AMYLASE in the last 168 hours.  Recent Labs  Lab 03/27/24 0957  AMMONIA 64*   Coagulation Profile:  Recent Labs  Lab 03/26/24 2005  INR 1.2   Unresulted Labs (From admission, onward)     Start     Ordered   04/03/24 0500  Creatinine, serum  (enoxaparin  (LOVENOX )    CrCl >/= 30 ml/min)  Weekly,   R     Comments: while on enoxaparin  therapy    03/27/24 0322   03/27/24 0321  CBC  (enoxaparin  (LOVENOX )    CrCl >/= 30 ml/min)  Once,   R       Comments: Baseline for enoxaparin  therapy IF NOT ALREADY DRAWN.  Notify MD if PLT < 100 K.    03/27/24 0322           Antimicrobials/Microbiology: Anti-infectives (From admission, onward)    Start     Dose/Rate Route Frequency Ordered Stop   03/31/24 0000  sulfamethoxazole -trimethoprim  (BACTRIM  DS) 800-160 MG tablet        1 tablet Oral Every 12 hours 03/31/24 1103 04/04/24 2359   03/30/24 1000  sulfamethoxazole -trimethoprim  (BACTRIM  DS) 800-160 MG per tablet 1 tablet        1 tablet Oral Every 12 hours 03/30/24 0845 04/02/24 0959   03/30/24 0915  ciprofloxacin  (CIPRO ) IVPB 200 mg  Status:  Discontinued        200 mg 100 mL/hr over 60 Minutes Intravenous Every 12 hours 03/30/24 0819 03/30/24 0844   03/27/24 2200  cefTRIAXone  (ROCEPHIN ) 1 g in sodium chloride  0.9 % 100 mL IVPB  Status:  Discontinued        1 g 200 mL/hr over 30 Minutes Intravenous Every 24 hours 03/27/24 0322 03/30/24 0819   03/26/24 2045  cefTRIAXone  (ROCEPHIN ) 2 g in sodium chloride  0.9 % 100 mL IVPB        2 g 200 mL/hr over 30 Minutes Intravenous Once 03/26/24 2041 03/27/24 0000         Component Value Date/Time   SDES URINE, CATHETERIZED 03/27/2024 2001   SPECREQUEST  03/27/2024 2001    NONE Performed at [facility], [address], [place], [place] [address]    CULT  03/27/2024 2001    Two isolates with different morphologies were identified as the same organism.The most resistant  organism was reported. >=100,000 COLONIES/mL ESCHERICHIA COLI Confirmed Extended Spectrum Beta-Lactamase Producer (ESBL).  In bloodstream infections from ESBL organisms, carbapenems are preferred over piperacillin/tazobactam. They are shown to have a lower risk of mortality.    REPTSTATUS 03/30/2024 FINAL 03/27/2024 2001    Procedures:   Mennie LAMY, MD [facility] 03/31/2024, 12:52 PM

## 2024-03-31 NOTE — TOC Progression Note (Signed)
 Transition of Care [facility]) - Progression Note    Patient Details  Name: Kristen Bowman MRN: [id number] Date of Birth: Nov 13, 1930  [facility]) CM/SW Contact  Nixon Sparr A [place], LCSW Phone Number: 03/31/2024, 1:42 PM  Clinical Narrative:     CSW spoke with pt's daughter Kristen Bowman. She made bed choice to [facility], facility notified, bed available tomorrow.  Pt is medically stable CSW started insurance authorization, auth currently pending.  Reference ID :[id number]  CSW will continue to follow.   Expected Discharge Plan: Skilled Nursing Facility Barriers to Discharge: Continued Medical Work up, SNF Pending bed offer, English as a second language teacher               Expected Discharge Plan and Services     Post Acute Care Choice: Skilled Nursing Facility Living arrangements for the past 2 months: Single Family Home                                       Social Drivers of Health (SDOH) Interventions SDOH Screenings   Food Insecurity: No Food Insecurity (03/27/2024)  Housing: Low Risk  (03/27/2024)  Transportation Needs: No Transportation Needs (03/27/2024)  Utilities: Not At Risk (03/27/2024)  Social Connections: Socially Isolated (03/27/2024)  Tobacco Use: Low Risk  (03/27/2024)    Readmission Risk Interventions     No data to display

## 2024-03-31 NOTE — Care Management Important Message (Addendum)
 Important Message  Patient Details  Name: Kristen Bowman MRN: [id number] Date of Birth: 1930-12-24   Important Message Given:  Yes - Medicare IM   [facility] IM was mail to the patient home address. Patient has a contact precaution order in place.  Vannak Montenegro 03/31/2024, 3:36 PM

## 2024-03-31 NOTE — Plan of Care (Signed)

## 2024-04-01 DIAGNOSIS — G9340 Encephalopathy, unspecified: Secondary | ICD-10-CM | POA: Diagnosis not present

## 2024-04-01 LAB — GLUCOSE, CAPILLARY: Glucose-Capillary: 152 mg/dL — ABNORMAL HIGH (ref 70–99)

## 2024-04-01 NOTE — Discharge Summary (Addendum)
 Physician Discharge Summary  Kristen Bowman FMW:[id number] DOB: June 18, 1931 DOA: 03/26/2024  PCP: Valentin Skates, DO  Admit date: 03/26/2024 Discharge date: 04/01/2024 Recommendations for Outpatient Follow-up:  Follow up with PCP in 1 weeks-call for appointment Please obtain BMP/CBC in one week  Discharge Dispo: SNF, with PALLIATIVE Discharge Condition: Stable Code Status:   Code Status: Full Code Diet recommendation:  Diet Order             Diet heart healthy/carb modified Room service appropriate? Yes; Fluid consistency: Thin  Diet effective now                   Brief/Interim Summary: 89 y.o. female with history of dementia, diabetes mellitus type 2, hypertension, chronic kidney disease stage II, anemia, depression was brought to the ER after patient was feeling increasingly weak poor appetite and confused AND worsening for chronic diarrhea over the last 2 to 3 days PTA. At baseline >Ambulates with help and has not been doing it for last 2 to 3 days and has been been eating less. In the ED patient had fever to 101 F.  UA is concerning for UTI. CT of the head>> NAD. Cultures were sent and started on empiric antibiotics admitted for acute encephalopathy suspect likely from UTI. Labs show anemia and mild renal failure which are chronic Mental status slowly improving. With her dementia notable decline in overall cognition planning for discharge to SNF and hospice to follow-up  Subjective: Seen and examined  Waking up this morning.  alert awake resting comfortably pleasantly confused able to tell me her name and date of birth  Overnight afebrile  Discharge diagnosis  E. coli UTI Acute encephalopathy likely from E. coli UTI Dementia at baseline-with significant decline Depressiong Goals of care: CT head no acute finding.  Mentation aware overall stable, does have baseline dementia alert awake and oriented to self Workup with E. coli UTI-sensitive to Cipro  Macrobid  Bactrim  Zosyn>  discussed with pharmacy  now on Bactrim  to complete course Continue delirium precaution fall precaution PT OT and rehab With her significant decline plan is for discharge to SNF and palliative care to follow-up.  Continue with Aricept . Of note her ammonia is increased.  Unclear cause of this as LFTs are normal cont Lactulose  Continue her home Depakote  Risperdal  Zoloft  and trazodone  and Klonopin  for anxiety Valproic acid  level is low-may need to increase defer to outpatient noting that she is going SNF, with PALLIATIVE  Hypertension Sinus tachycardia New onset RBBB: Tachycardia likely multifactorial with UTI.cardiology consulted 8/5 no further recommendation.  BP well-controlled.  Continue metoprolol , amlodipine   losartan .Troponin was borderline at 21  T2DM W/ uncontrolled hyperglycemia: Lantus  was increased and also on sliding scale blood sugar overall stable  Recent Labs  Lab 03/31/24 0811 03/31/24 1239 03/31/24 1617 03/31/24 2043 04/01/24 0914  GLUCAP 200* 191* 166* 235* 152*    Chronic kidney disease stage II Hypokalemia: Stable at baseline  HLD: Continue statin  Chronic anemia  likely from renal disease follow CBC.     Chronic diarrhea  Stable currently.  Monitor on lactulose   Mobility: PT Orders: Active PT Follow up Rec: Skilled Nursing-Short Term Rehab (<3 Hours/Day)03/30/2024 1608   DVT prophylaxis: enoxaparin  (LOVENOX ) injection 40 mg Start: 03/27/24 1600 Code Status:   Code Status: Full Code Family Communication: plan of care discussed with patient at bedside. Patient status is: Remains hospitalized because of severity of illness Level of care: Telemetry Medical   Dispo: The patient is from: HOME  Anticipated disposition:SNF, with PALLIATIVE Objective: Vitals last 24 hrs: Vitals:   03/31/24 1618 03/31/24 2041 04/01/24 0000 04/01/24 0500  BP: 136/66 136/88 (!) 142/60 (!) 166/69  Pulse: 73 90 66 76  Resp: 19 20 20 20   Temp: 98.7 F (37.1 C) 97.6  F (36.4 C) 98.3 F (36.8 C) 98.2 F (36.8 C)  TempSrc: Oral     SpO2: 100% 98% 99% 98%  Weight:      Height:        Physical Examination: General exam: alert awake, oriented x1.  She is pleasant not in distress. HEENT:Oral mucosa moist, Ear/Nose WNL grossly Respiratory system: Bilaterally clear BS,no use of accessory muscle Cardiovascular system: S1 & S2 +, No JVD. Gastrointestinal system: Abdomen soft,NT,ND, BS+ Nervous System: Alert, awake, moving all extremities,and following commands. Extremities: LE edema neg, distal extremities warm.  Skin: No rashes,no icterus. MSK: Normal muscle bulk,tone, power   Medications reviewed:  Scheduled Meds:  amLODipine   10 mg Oral Daily   atorvastatin   40 mg Oral Daily   clonazePAM   0.5 mg Oral Daily   divalproex   250 mg Oral BID   donepezil   5 mg Oral QPM   enoxaparin  (LOVENOX ) injection  40 mg Subcutaneous Q24H   insulin  aspart  0-5 Units Subcutaneous QHS   insulin  aspart  0-9 Units Subcutaneous TID WC   insulin  glargine-yfgn  20 Units Subcutaneous QHS   lactulose   20 g Oral BID   losartan   50 mg Oral Daily   melatonin  3 mg Oral QHS   metoprolol  tartrate  75 mg Oral BID   risperiDONE   0.5 mg Oral Daily   sertraline   50 mg Oral Daily   sulfamethoxazole -trimethoprim   1 tablet Oral Q12H   traZODone   50 mg Oral QHS   Continuous Infusions:   Diet: Diet Order             Diet heart healthy/carb modified Room service appropriate? Yes; Fluid consistency: Thin  Diet effective now                     Consultation: See note. Discharge Instructions  Discharge Instructions     Discharge instructions   Complete by: As directed    Please call call MD or return to ER for similar or worsening recurring problem that brought you to hospital or if any fever,nausea/vomiting,abdominal pain, uncontrolled pain, chest pain,  shortness of breath or any other alarming symptoms.  Please follow-up your doctor as instructed in a week time  and call the office for appointment.  Please avoid alcohol, smoking, or any other illicit substance and maintain healthy habits including taking your regular medications as prescribed.  You were cared for by a hospitalist during your hospital stay. If you have any questions about your discharge medications or the care you received while you were in the hospital after you are discharged, you can call the unit and ask to speak with the hospitalist on call if the hospitalist that took care of you is not available.  Once you are discharged, your primary care physician will handle any further medical issues. Please note that NO REFILLS for any discharge medications will be authorized once you are discharged, as it is imperative that you return to your primary care physician (or establish a relationship with a primary care physician if you do not have one) for your aftercare needs so that they can reassess your need for medications and monitor your lab values   Increase activity  slowly   Complete by: As directed       Allergies as of 04/01/2024       Reactions   Ace Inhibitors Shortness Of Breath, Swelling   Tape Other (See Comments)   Skin bruises and tears easily        Medication List     STOP taking these medications    levofloxacin 500 MG tablet Commonly known as: LEVAQUIN       TAKE these medications    amLODipine  10 MG tablet Commonly known as: NORVASC  Take 1 tablet (10 mg total) by mouth daily. What changed:  medication strength how much to take   atorvastatin  40 MG tablet Commonly known as: LIPITOR Take 40 mg by mouth daily.   clonazePAM  0.5 MG tablet Commonly known as: KLONOPIN  Take 1 tablet (0.5 mg total) by mouth daily for 3 doses.   [employer] Genuine Aspirin  325 MG tablet Generic drug: aspirin  Take 325 mg by mouth daily.   [employer] VITAMIN B12 1000 MCG tablet Generic drug: cyanocobalamin Take 1,000 mcg by mouth daily.   divalproex  125 MG capsule Commonly known as:  Depakote  Sprinkles Take 2 capsules (250 mg total) by mouth 2 (two) times daily.   donepezil  5 MG tablet Commonly known as: ARICEPT  Take 1 tablet (5 mg total) by mouth at bedtime. What changed: when to take this   insulin  lispro 100 UNIT/ML KwikPen Commonly known as: HumaLOG  KwikPen Use as directed sliding scale: Blood sugar 0 - 200 give 0 units, Blood sugar 200-250 give 2 units, blood sugar 250-300 give 4 units, blood sugar 300-350 give 6 units, blood sugar 350-400 give 8 units, blood sugar over 400 give 10 units. What changed:  how much to take when to take this additional instructions   lactulose  10 GM/15ML solution Commonly known as: CHRONULAC  Take 30 mLs (20 g total) by mouth 2 (two) times daily.   Lantus  SoloStar 100 UNIT/ML Solostar Pen Generic drug: insulin  glargine Inject 20 Units into the skin at bedtime. What changed: how much to take   losartan  50 MG tablet Commonly known as: COZAAR  Take 50 mg by mouth daily.   melatonin 3 MG Tabs tablet Take 3 mg by mouth at bedtime.   metFORMIN  500 MG tablet Commonly known as: GLUCOPHAGE  Take 750 mg by mouth daily with breakfast.   Metoprolol  Tartrate 75 MG Tabs Take 1 tablet (75 mg total) by mouth 2 (two) times daily. What changed:  medication strength how much to take   risperiDONE  0.5 MG tablet Commonly known as: RISPERDAL  Take 0.5 mg by mouth daily.   sertraline  50 MG tablet Commonly known as: ZOLOFT  Take 50 mg by mouth daily.   sulfamethoxazole -trimethoprim  800-160 MG tablet Commonly known as: BACTRIM  DS Take 1 tablet by mouth every 12 (twelve) hours for 4 days.   traZODone  50 MG tablet Commonly known as: DESYREL  Take 50 mg by mouth at bedtime.        Contact information for follow-up providers     Valentin Skates, DO Follow up in 1 week(s).   Specialty: Internal Medicine Contact information: [address] [place] [address] [phone]              Contact information for after-discharge  care     Destination     [facility] .   Service: Skilled Nursing Contact information: [address] [place]  [phone]  Allergies  Allergen Reactions   Ace Inhibitors Shortness Of Breath and Swelling   Tape Other (See Comments)    Skin bruises and tears easily    The results of significant diagnostics from this hospitalization (including imaging, microbiology, ancillary and laboratory) are listed below for reference.    Microbiology: Recent Results (from the past 240 hours)  Culture, blood (Routine x 2)     Status: None   Collection Time: 03/26/24  8:32 PM   Specimen: BLOOD RIGHT ARM  Result Value Ref Range Status   Specimen Description BLOOD RIGHT ARM  Final   Special Requests   Final    BOTTLES DRAWN AEROBIC AND ANAEROBIC Blood Culture results may not be optimal due to an inadequate volume of blood received in culture bottles   Culture   Final    NO GROWTH 5 DAYS Performed at [facility], [address], [place], [place] [address]    Report Status 03/31/2024 FINAL  Final  Culture, blood (Routine x 2)     Status: None   Collection Time: 03/26/24  8:35 PM   Specimen: BLOOD  Result Value Ref Range Status   Specimen Description BLOOD SITE NOT SPECIFIED  Final   Special Requests   Final    BOTTLES DRAWN AEROBIC AND ANAEROBIC Blood Culture adequate volume   Culture   Final    NO GROWTH 5 DAYS Performed at [facility], [address] [place], [place] [address]    Report Status 03/31/2024 FINAL  Final  Resp panel by RT-PCR (RSV, Flu A&B, Covid) Anterior Nasal Swab     Status: None   Collection Time: 03/26/24  8:41 PM   Specimen: Anterior Nasal Swab  Result Value Ref Range Status   SARS Coronavirus 2 by RT PCR NEGATIVE NEGATIVE Final   Influenza A by PCR NEGATIVE NEGATIVE Final   Influenza B by PCR NEGATIVE NEGATIVE Final    Comment: (NOTE) The Xpert Xpress  SARS-CoV-2/FLU/RSV plus assay is intended as an aid in the diagnosis of influenza from Nasopharyngeal swab specimens and should not be used as a sole basis for treatment. Nasal washings and aspirates are unacceptable for Xpert Xpress SARS-CoV-2/FLU/RSV testing.  Fact Sheet for Patients: BloggerCourse.com  Fact Sheet for Healthcare Providers: SeriousBroker.it  This test is not yet approved or cleared by the [place]  FDA and has been authorized for detection and/or diagnosis of SARS-CoV-2 by FDA under an Emergency Use Authorization (EUA). This EUA will remain in effect (meaning this test can be used) for the duration of the COVID-19 declaration under Section 564(b)(1) of the Act, 21 U.S.C. section 360bbb-3(b)(1), unless the authorization is terminated or revoked.     Resp Syncytial Virus by PCR NEGATIVE NEGATIVE Final    Comment: (NOTE) Fact Sheet for Patients: BloggerCourse.com  Fact Sheet for Healthcare Providers: SeriousBroker.it  This test is not yet approved or cleared by the [place]  FDA and has been authorized for detection and/or diagnosis of SARS-CoV-2 by FDA under an Emergency Use Authorization (EUA). This EUA will remain in effect (meaning this test can be used) for the duration of the COVID-19 declaration under Section 564(b)(1) of the Act, 21 U.S.C. section 360bbb-3(b)(1), unless the authorization is terminated or revoked.  Performed at [facility], [address], [place], [place] [address]   Urine Culture (for pregnant, neutropenic or urologic patients or patients with an indwelling urinary catheter)     Status: Abnormal   Collection Time: 03/27/24  8:01  PM   Specimen: Urine, Catheterized  Result Value Ref Range Status   Specimen Description URINE, CATHETERIZED  Final   Special Requests   Final    NONE Performed at [facility], [address], [place], [place] [address]    Culture   Final    Two isolates with different morphologies were identified as the same organism.The most resistant organism was reported. >=100,000 COLONIES/mL ESCHERICHIA COLI Confirmed Extended Spectrum Beta-Lactamase Producer (ESBL).  In bloodstream infections from ESBL organisms, carbapenems are preferred over piperacillin/tazobactam. They are shown to have a lower risk of mortality.    Report Status 03/30/2024 FINAL  Final   Organism ID, Bacteria ESCHERICHIA COLI (A)  Final      Susceptibility   Escherichia coli - MIC*    AMPICILLIN >=32 RESISTANT Resistant     CEFAZOLIN >=64 RESISTANT Resistant     CEFEPIME  16 RESISTANT Resistant     CEFTRIAXONE  >=64 RESISTANT Resistant     CIPROFLOXACIN  <=0.25 SENSITIVE Sensitive     GENTAMICIN <=1 SENSITIVE Sensitive     IMIPENEM <=0.25 SENSITIVE Sensitive     NITROFURANTOIN  <=16 SENSITIVE Sensitive     TRIMETH /SULFA  <=20 SENSITIVE Sensitive     AMPICILLIN/SULBACTAM >=32 RESISTANT Resistant     PIP/TAZO <=4 SENSITIVE Sensitive ug/mL    * >=100,000 COLONIES/mL ESCHERICHIA COLI    Procedures/Studies: DG Chest Port 1 View Result Date: 03/29/2024 CLINICAL DATA:  Tachycardia EXAM: PORTABLE CHEST 1 VIEW COMPARISON:  11/15/2019 FINDINGS: Hypoventilatory changes. Borderline cardiomegaly. Central bronchovascular crowding due to low lung volume. No consolidation, pleural effusion or pneumothorax. Aortic atherosclerosis IMPRESSION: Hypoventilatory changes. Borderline cardiomegaly. Electronically Signed   By: Luke Bun M.D.   On: 03/29/2024 18:26   CT Head Wo Contrast Result Date: 03/26/2024 EXAM: CT HEAD WITHOUT 03/26/2024 09:11:00 PM TECHNIQUE: CT of the head was performed without the administration of intravenous contrast. Automated exposure control, iterative reconstruction, and/or weight based adjustment of the mA/kV was utilized to reduce the radiation dose to as low as reasonably achievable. COMPARISON:  08/22/2021 CLINICAL HISTORY: Mental status change, unknown cause. Altered Mental Status; CT Head Wo Contrast; Mental status change, unknown cause. Pt very altered in CT, best scans possible ; See ED Notes:; Pt bib ems from home with concern for ams. Pt's family states she was at baseline at 2200 last night. Pt has hx of dementia. Ems advised that pt has a strong smell of urine emanating from her person. FINDINGS: BRAIN AND VENTRICLES: No acute intracranial hemorrhage. No mass effect or midline shift. No extra-axial fluid collection. Gray-white differentiation is maintained. No hydrocephalus. Generalized volume loss and chronic ischemic white matter changes. ORBITS: No acute abnormality. SINUSES AND MASTOIDS: No acute abnormality. SOFT TISSUES AND SKULL: No acute skull fracture. No acute soft tissue abnormality. IMPRESSION: 1. No acute intracranial abnormality. 2. Generalized volume loss and chronic ischemic white matter changes. Electronically signed by: Franky Stanford MD 03/26/2024 09:19 PM EDT RP Workstation: [id number]    Labs: BNP (last 3 results) No results for input(s): BNP in the last 8760 hours. Basic Metabolic Panel: Recent Labs  Lab 03/26/24 2005 03/27/24 0957 03/28/24 0919 03/29/24 0437 03/30/24 0431  NA 136 133* 136 138 138  K 3.4* 3.6 3.4* 3.9 3.5  CL 102 101 104 106 102  CO2 23 23 25 24 28   GLUCOSE 227* 302* 142* 227* 291*  BUN 14 13 10 9  7*  CREATININE 1.01* 0.84 0.91 0.89 0.82  CALCIUM  8.4* 8.1* 8.1* 8.4* 8.4*   Liver  Function Tests: Recent Labs  Lab 03/26/24 2005 03/27/24 0957  AST 32 21  ALT 26 21  ALKPHOS 70 60  BILITOT 1.1 0.4  PROT 6.0* 5.4*  ALBUMIN  2.3* 1.9*   No results for input(s): LIPASE, AMYLASE in the last 168 hours. Recent Labs  Lab 03/27/24 0957  AMMONIA 64*   CBC: Recent Labs  Lab 03/26/24 2005 03/27/24 0957 03/28/24 0919 03/29/24 0437 03/30/24 0431  WBC 11.5* 8.3 6.8 5.9 5.9  NEUTROABS 9.2* 6.5  --   --   --   HGB 10.9* 9.8* 9.7*  10.0* 10.4*  HCT 33.3* 30.1* 29.3* 31.2* 31.9*  MCV 86.3 86.7 86.7 87.4 86.2  PLT 205 181 189 210 221   CBG: Recent Labs  Lab 03/31/24 0811 03/31/24 1239 03/31/24 1617 03/31/24 2043 04/01/24 0914  GLUCAP 200* 191* 166* 235* 152*  Urinalysis    Component Value Date/Time   COLORURINE YELLOW 03/26/2024 2035   APPEARANCEUR TURBID (A) 03/26/2024 2035   LABSPEC 1.014 03/26/2024 2035   PHURINE 5.0 03/26/2024 2035   GLUCOSEU NEGATIVE 03/26/2024 2035   HGBUR SMALL (A) 03/26/2024 2035   BILIRUBINUR NEGATIVE 03/26/2024 2035   KETONESUR NEGATIVE 03/26/2024 2035   PROTEINUR 100 (A) 03/26/2024 2035   UROBILINOGEN 0.2 09/24/2012 2220   NITRITE NEGATIVE 03/26/2024 2035   LEUKOCYTESUR MODERATE (A) 03/26/2024 2035   Sepsis Labs Recent Labs  Lab 03/27/24 0957 03/28/24 0919 03/29/24 0437 03/30/24 0431  WBC 8.3 6.8 5.9 5.9   Microbiology Recent Results (from the past 240 hours)  Culture, blood (Routine x 2)     Status: None   Collection Time: 03/26/24  8:32 PM   Specimen: BLOOD RIGHT ARM  Result Value Ref Range Status   Specimen Description BLOOD RIGHT ARM  Final   Special Requests   Final    BOTTLES DRAWN AEROBIC AND ANAEROBIC Blood Culture results may not be optimal due to an inadequate volume of blood received in culture bottles   Culture   Final    NO GROWTH 5 DAYS Performed at [facility], [address] [place], [place] [address]    Report Status 03/31/2024 FINAL  Final  Culture, blood (Routine x 2)     Status: None   Collection Time: 03/26/24  8:35 PM   Specimen: BLOOD  Result Value Ref Range Status   Specimen Description BLOOD SITE NOT SPECIFIED  Final   Special Requests   Final    BOTTLES DRAWN AEROBIC AND ANAEROBIC Blood Culture adequate volume   Culture   Final    NO GROWTH 5 DAYS Performed at [facility], [address] [place], [place] [address]    Report Status 03/31/2024 FINAL  Final  Resp panel by RT-PCR (RSV, Flu A&B, Covid) Anterior Nasal  Swab     Status: None   Collection Time: 03/26/24  8:41 PM   Specimen: Anterior Nasal Swab  Result Value Ref Range Status   SARS Coronavirus 2 by RT PCR NEGATIVE NEGATIVE Final   Influenza A by PCR NEGATIVE NEGATIVE Final   Influenza B by PCR NEGATIVE NEGATIVE Final    Comment: (NOTE) The Xpert Xpress SARS-CoV-2/FLU/RSV plus assay is intended as an aid in the diagnosis of influenza from Nasopharyngeal swab specimens and should not be used as a sole basis for treatment. Nasal washings and aspirates are unacceptable for Xpert Xpress SARS-CoV-2/FLU/RSV testing.  Fact Sheet for Patients: BloggerCourse.com  Fact Sheet for Healthcare Providers: SeriousBroker.it  This test is not yet approved  or cleared by the [place]  FDA and has been authorized for detection and/or diagnosis of SARS-CoV-2 by FDA under an Emergency Use Authorization (EUA). This EUA will remain in effect (meaning this test can be used) for the duration of the COVID-19 declaration under Section 564(b)(1) of the Act, 21 U.S.C. section 360bbb-3(b)(1), unless the authorization is terminated or revoked.     Resp Syncytial Virus by PCR NEGATIVE NEGATIVE Final    Comment: (NOTE) Fact Sheet for Patients: BloggerCourse.com  Fact Sheet for Healthcare Providers: SeriousBroker.it  This test is not yet approved or cleared by the [place]  FDA and has been authorized for detection and/or diagnosis of SARS-CoV-2 by FDA under an Emergency Use Authorization (EUA). This EUA will remain in effect (meaning this test can be used) for the duration of the COVID-19 declaration under Section 564(b)(1) of the Act, 21 U.S.C. section 360bbb-3(b)(1), unless the authorization is terminated or revoked.  Performed at [facility], [address] [place], [place] [address]   Urine Culture (for pregnant, neutropenic or urologic  patients or patients with an indwelling urinary catheter)     Status: Abnormal   Collection Time: 03/27/24  8:01 PM   Specimen: Urine, Catheterized  Result Value Ref Range Status   Specimen Description URINE, CATHETERIZED  Final   Special Requests   Final    NONE Performed at [facility], [address] [place], [place] [address]    Culture   Final    Two isolates with different morphologies were identified as the same organism.The most resistant organism was reported. >=100,000 COLONIES/mL ESCHERICHIA COLI Confirmed Extended Spectrum Beta-Lactamase Producer (ESBL).  In bloodstream infections from ESBL organisms, carbapenems are preferred over piperacillin/tazobactam. They are shown to have a lower risk of mortality.    Report Status 03/30/2024 FINAL  Final   Organism ID, Bacteria ESCHERICHIA COLI (A)  Final      Susceptibility   Escherichia coli - MIC*    AMPICILLIN >=32 RESISTANT Resistant     CEFAZOLIN >=64 RESISTANT Resistant     CEFEPIME  16 RESISTANT Resistant     CEFTRIAXONE  >=64 RESISTANT Resistant     CIPROFLOXACIN  <=0.25 SENSITIVE Sensitive     GENTAMICIN <=1 SENSITIVE Sensitive     IMIPENEM <=0.25 SENSITIVE Sensitive     NITROFURANTOIN  <=16 SENSITIVE Sensitive     TRIMETH /SULFA  <=20 SENSITIVE Sensitive     AMPICILLIN/SULBACTAM >=32 RESISTANT Resistant     PIP/TAZO <=4 SENSITIVE Sensitive ug/mL    * >=100,000 COLONIES/mL ESCHERICHIA COLI   Time coordinating discharge: 35  minutes  SIGNED: Mennie LAMY, MD  [facility] 04/01/2024, 9:57 AM  If 7PM-7AM, please contact night-coverage [email or web address]

## 2024-04-01 NOTE — TOC Transition Note (Addendum)
 Transition of Care [facility]) - Discharge Note   Patient Details  Name: Kristen Bowman MRN: [id number] Date of Birth: 11-07-1930  Transition of Care [facility]) CM/SW Contact:  Reveca Desmarais A [place], LCSW Phone Number: 04/01/2024, 10:19 AM   Clinical Narrative:     Patient will DC to: [facility]  Anticipated DC date: 04/01/24  Family notified: [name] Dragon  Transport by: [facility]   Approval Dates: 8/6-04/02/24   Per MD patient ready for DC to [facility]. RN, patient, patient's family, and facility notified of DC. Discharge Summary and FL2 sent to facility. RN to call report prior to discharge (Room 501, [phone]). DC packet on chart. [facility] following outpatient to facility, notified of DC. Ambulance transport requested for patient.     CSW will sign off for now as social work intervention is no longer needed. Please consult us  again if new needs arise.   Final next level of care: Skilled Nursing Facility Barriers to Discharge: Barriers Resolved   Patient Goals and CMS Choice   CMS Medicare.gov Compare Post Acute Care list provided to:: Other (Comment Required) (adult daughter) Choice offered to / list presented to : Adult Children Barren ownership interest in [facility].provided to:: Adult Children    Discharge Placement              Patient chooses bed at: [facility] Patient to be transferred to facility by: [facility] Name of family member notified: [name] Dragon, pt's daughter Patient and family notified of of transfer: 04/01/24  Discharge Plan and Services Additional resources added to the After Visit Summary for       Post Acute Care Choice: Skilled Nursing Facility                               Social Drivers of Health (SDOH) Interventions SDOH Screenings   Food Insecurity: No Food Insecurity (03/27/2024)  Housing: Low Risk  (03/27/2024)  Transportation Needs: No Transportation Needs (03/27/2024)  Utilities: Not At Risk  (03/27/2024)  Social Connections: Socially Isolated (03/27/2024)  Tobacco Use: Low Risk  (03/27/2024)     Readmission Risk Interventions     No data to display

## 2024-04-01 NOTE — Plan of Care (Signed)
  Problem: Education: Goal: Knowledge of General Education information will improve Description: Including pain rating scale, medication(s)/side effects and non-pharmacologic comfort measures Outcome: Adequate for Discharge   Problem: Health Behavior/Discharge Planning: Goal: Ability to manage health-related needs will improve Outcome: Adequate for Discharge   Problem: Clinical Measurements: Goal: Ability to maintain clinical measurements within normal limits will improve Outcome: Adequate for Discharge Goal: Will remain free from infection Outcome: Adequate for Discharge Goal: Diagnostic test results will improve Outcome: Adequate for Discharge Goal: Respiratory complications will improve Outcome: Adequate for Discharge Goal: Cardiovascular complication will be avoided Outcome: Adequate for Discharge   Problem: Activity: Goal: Risk for activity intolerance will decrease Outcome: Adequate for Discharge   Problem: Nutrition: Goal: Adequate nutrition will be maintained Outcome: Adequate for Discharge   Problem: Coping: Goal: Level of anxiety will decrease Outcome: Adequate for Discharge   Problem: Elimination: Goal: Will not experience complications related to bowel motility Outcome: Adequate for Discharge Goal: Will not experience complications related to urinary retention Outcome: Adequate for Discharge   Problem: Pain Managment: Goal: General experience of comfort will improve and/or be controlled Outcome: Adequate for Discharge   Problem: Safety: Goal: Ability to remain free from injury will improve Outcome: Adequate for Discharge   Problem: Skin Integrity: Goal: Risk for impaired skin integrity will decrease Outcome: Adequate for Discharge   Problem: Acute Rehab PT Goals(only PT should resolve) Goal: Pt will Roll Supine to Side Outcome: Adequate for Discharge Goal: Pt Will Go Supine/Side To Sit Outcome: Adequate for Discharge Goal: Patient Will Perform  Sitting Balance Outcome: Adequate for Discharge Goal: Patient Will Transfer Sit To/From Stand Outcome: Adequate for Discharge Goal: Pt Will Transfer Bed To Chair/Chair To Bed Outcome: Adequate for Discharge Goal: Pt Will Ambulate Outcome: Adequate for Discharge   Problem: Education: Goal: Ability to describe self-care measures that may prevent or decrease complications (Diabetes Survival Skills Education) will improve Outcome: Adequate for Discharge Goal: Individualized Educational Video(s) Outcome: Adequate for Discharge   Problem: Coping: Goal: Ability to adjust to condition or change in health will improve Outcome: Adequate for Discharge   Problem: Fluid Volume: Goal: Ability to maintain a balanced intake and output will improve Outcome: Adequate for Discharge   Problem: Health Behavior/Discharge Planning: Goal: Ability to identify and utilize available resources and services will improve Outcome: Adequate for Discharge Goal: Ability to manage health-related needs will improve Outcome: Adequate for Discharge   Problem: Metabolic: Goal: Ability to maintain appropriate glucose levels will improve Outcome: Adequate for Discharge   Problem: Nutritional: Goal: Maintenance of adequate nutrition will improve Outcome: Adequate for Discharge Goal: Progress toward achieving an optimal weight will improve Outcome: Adequate for Discharge   Problem: Skin Integrity: Goal: Risk for impaired skin integrity will decrease Outcome: Adequate for Discharge   Problem: Tissue Perfusion: Goal: Adequacy of tissue perfusion will improve Outcome: Adequate for Discharge

## 2024-04-01 NOTE — Plan of Care (Signed)

## 2024-04-01 NOTE — TOC Progression Note (Signed)
 Transition of Care [facility]) - Progression Note    Patient Details  Name: Kristen Bowman MRN: [id number] Date of Birth: 1931-01-12  Transition of Care [facility]) CM/SW Contact  Maryelizabeth Eberle A [place], LCSW Phone Number: 04/01/2024, 9:04 AM  Clinical Narrative:     Pt's authorization was approved for [facility].   Auth ID [id number] Reference ID [id number]  CSW confirming with facility that bed is still available today.  MD notified, EDD today.    Expected Discharge Plan: Skilled Nursing Facility Barriers to Discharge: Continued Medical Work up, SNF Pending bed offer, English as a second language teacher               Expected Discharge Plan and Services     Post Acute Care Choice: Skilled Nursing Facility Living arrangements for the past 2 months: Single Family Home                                       Social Drivers of Health (SDOH) Interventions SDOH Screenings   Food Insecurity: No Food Insecurity (03/27/2024)  Housing: Low Risk  (03/27/2024)  Transportation Needs: No Transportation Needs (03/27/2024)  Utilities: Not At Risk (03/27/2024)  Social Connections: Socially Isolated (03/27/2024)  Tobacco Use: Low Risk  (03/27/2024)    Readmission Risk Interventions     No data to display

## 2024-04-12 ENCOUNTER — Ambulatory Visit: Admitting: Physician Assistant

## 2024-04-25 ENCOUNTER — Other Ambulatory Visit: Payer: Self-pay | Admitting: Physician Assistant

## 2024-04-26 ENCOUNTER — Other Ambulatory Visit: Payer: Self-pay | Admitting: Physician Assistant

## 2024-06-10 ENCOUNTER — Telehealth: Payer: Self-pay | Admitting: Physician Assistant

## 2024-06-10 NOTE — Telephone Encounter (Signed)
 Pt's daughter called in this morning, stating that the prescription called Divalproex   They feel that that prescription  is making pt want to fight and refuse to go to [facility]. Please call.

## 2024-06-10 NOTE — Telephone Encounter (Signed)
 I tried to call Arley son whom is on the [facility], no voicemail set up.

## 2024-06-10 NOTE — Telephone Encounter (Signed)
 Tried to call no answer

## 2024-06-15 ENCOUNTER — Inpatient Hospital Stay (HOSPITAL_COMMUNITY)
Admission: EM | Admit: 2024-06-15 | Discharge: 2024-06-29 | DRG: 871 | Disposition: A | Discharge status: Hospice | Attending: Internal Medicine | Admitting: Internal Medicine

## 2024-06-15 ENCOUNTER — Emergency Department (HOSPITAL_COMMUNITY)

## 2024-06-15 DIAGNOSIS — Z9841 Cataract extraction status, right eye: Secondary | ICD-10-CM

## 2024-06-15 DIAGNOSIS — M353 Polymyalgia rheumatica: Secondary | ICD-10-CM | POA: Diagnosis present

## 2024-06-15 DIAGNOSIS — L8990 Pressure ulcer of unspecified site, unspecified stage: Secondary | ICD-10-CM | POA: Insufficient documentation

## 2024-06-15 DIAGNOSIS — J9600 Acute respiratory failure, unspecified whether with hypoxia or hypercapnia: Secondary | ICD-10-CM | POA: Diagnosis not present

## 2024-06-15 DIAGNOSIS — L89153 Pressure ulcer of sacral region, stage 3: Secondary | ICD-10-CM | POA: Diagnosis present

## 2024-06-15 DIAGNOSIS — Z888 Allergy status to other drugs, medicaments and biological substances status: Secondary | ICD-10-CM

## 2024-06-15 DIAGNOSIS — I129 Hypertensive chronic kidney disease with stage 1 through stage 4 chronic kidney disease, or unspecified chronic kidney disease: Secondary | ICD-10-CM | POA: Diagnosis present

## 2024-06-15 DIAGNOSIS — F0394 Unspecified dementia, unspecified severity, with anxiety: Secondary | ICD-10-CM | POA: Diagnosis present

## 2024-06-15 DIAGNOSIS — N390 Urinary tract infection, site not specified: Secondary | ICD-10-CM | POA: Diagnosis present

## 2024-06-15 DIAGNOSIS — Z515 Encounter for palliative care: Secondary | ICD-10-CM

## 2024-06-15 DIAGNOSIS — Z794 Long term (current) use of insulin: Secondary | ICD-10-CM

## 2024-06-15 DIAGNOSIS — D631 Anemia in chronic kidney disease: Secondary | ICD-10-CM | POA: Diagnosis present

## 2024-06-15 DIAGNOSIS — Z66 Do not resuscitate: Secondary | ICD-10-CM | POA: Diagnosis not present

## 2024-06-15 DIAGNOSIS — E1010 Type 1 diabetes mellitus with ketoacidosis without coma: Principal | ICD-10-CM | POA: Diagnosis present

## 2024-06-15 DIAGNOSIS — I2489 Other forms of acute ischemic heart disease: Secondary | ICD-10-CM | POA: Diagnosis present

## 2024-06-15 DIAGNOSIS — L89626 Pressure-induced deep tissue damage of left heel: Secondary | ICD-10-CM | POA: Diagnosis present

## 2024-06-15 DIAGNOSIS — Z7982 Long term (current) use of aspirin: Secondary | ICD-10-CM

## 2024-06-15 DIAGNOSIS — A419 Sepsis, unspecified organism: Secondary | ICD-10-CM | POA: Diagnosis present

## 2024-06-15 DIAGNOSIS — N1831 Chronic kidney disease, stage 3a: Secondary | ICD-10-CM | POA: Diagnosis present

## 2024-06-15 DIAGNOSIS — E785 Hyperlipidemia, unspecified: Secondary | ICD-10-CM | POA: Diagnosis present

## 2024-06-15 DIAGNOSIS — F0390 Unspecified dementia without behavioral disturbance: Secondary | ICD-10-CM | POA: Diagnosis present

## 2024-06-15 DIAGNOSIS — F03918 Unspecified dementia, unspecified severity, with other behavioral disturbance: Secondary | ICD-10-CM | POA: Diagnosis present

## 2024-06-15 DIAGNOSIS — G9341 Metabolic encephalopathy: Secondary | ICD-10-CM | POA: Diagnosis present

## 2024-06-15 DIAGNOSIS — R531 Weakness: Secondary | ICD-10-CM | POA: Diagnosis not present

## 2024-06-15 DIAGNOSIS — Z7984 Long term (current) use of oral hypoglycemic drugs: Secondary | ICD-10-CM

## 2024-06-15 DIAGNOSIS — E1022 Type 1 diabetes mellitus with diabetic chronic kidney disease: Secondary | ICD-10-CM | POA: Diagnosis present

## 2024-06-15 DIAGNOSIS — E876 Hypokalemia: Secondary | ICD-10-CM | POA: Diagnosis not present

## 2024-06-15 DIAGNOSIS — I700 Atherosclerosis of aorta: Secondary | ICD-10-CM | POA: Diagnosis present

## 2024-06-15 DIAGNOSIS — I10 Essential (primary) hypertension: Secondary | ICD-10-CM | POA: Diagnosis present

## 2024-06-15 DIAGNOSIS — Z91048 Other nonmedicinal substance allergy status: Secondary | ICD-10-CM

## 2024-06-15 DIAGNOSIS — I2510 Atherosclerotic heart disease of native coronary artery without angina pectoris: Secondary | ICD-10-CM | POA: Diagnosis present

## 2024-06-15 DIAGNOSIS — E46 Unspecified protein-calorie malnutrition: Secondary | ICD-10-CM | POA: Diagnosis present

## 2024-06-15 DIAGNOSIS — N179 Acute kidney failure, unspecified: Secondary | ICD-10-CM | POA: Diagnosis present

## 2024-06-15 DIAGNOSIS — Z711 Person with feared health complaint in whom no diagnosis is made: Secondary | ICD-10-CM | POA: Diagnosis not present

## 2024-06-15 DIAGNOSIS — L89303 Pressure ulcer of unspecified buttock, stage 3: Secondary | ICD-10-CM | POA: Diagnosis not present

## 2024-06-15 DIAGNOSIS — E8770 Fluid overload, unspecified: Secondary | ICD-10-CM | POA: Diagnosis not present

## 2024-06-15 DIAGNOSIS — R0603 Acute respiratory distress: Secondary | ICD-10-CM | POA: Diagnosis not present

## 2024-06-15 DIAGNOSIS — J9601 Acute respiratory failure with hypoxia: Secondary | ICD-10-CM | POA: Diagnosis present

## 2024-06-15 DIAGNOSIS — Z79899 Other long term (current) drug therapy: Secondary | ICD-10-CM

## 2024-06-15 DIAGNOSIS — R4182 Altered mental status, unspecified: Secondary | ICD-10-CM | POA: Diagnosis not present

## 2024-06-15 DIAGNOSIS — R7989 Other specified abnormal findings of blood chemistry: Secondary | ICD-10-CM

## 2024-06-15 DIAGNOSIS — F32A Depression, unspecified: Secondary | ICD-10-CM | POA: Diagnosis present

## 2024-06-15 DIAGNOSIS — N182 Chronic kidney disease, stage 2 (mild): Secondary | ICD-10-CM | POA: Diagnosis not present

## 2024-06-15 DIAGNOSIS — R638 Other symptoms and signs concerning food and fluid intake: Secondary | ICD-10-CM | POA: Diagnosis not present

## 2024-06-15 DIAGNOSIS — E870 Hyperosmolality and hypernatremia: Secondary | ICD-10-CM | POA: Diagnosis present

## 2024-06-15 DIAGNOSIS — Z87440 Personal history of urinary (tract) infections: Secondary | ICD-10-CM

## 2024-06-15 DIAGNOSIS — G9340 Encephalopathy, unspecified: Secondary | ICD-10-CM | POA: Diagnosis not present

## 2024-06-15 DIAGNOSIS — Z6824 Body mass index (BMI) 24.0-24.9, adult: Secondary | ICD-10-CM

## 2024-06-15 DIAGNOSIS — R6520 Severe sepsis without septic shock: Secondary | ICD-10-CM | POA: Diagnosis present

## 2024-06-15 DIAGNOSIS — R4189 Other symptoms and signs involving cognitive functions and awareness: Secondary | ICD-10-CM | POA: Diagnosis present

## 2024-06-15 DIAGNOSIS — E1110 Type 2 diabetes mellitus with ketoacidosis without coma: Secondary | ICD-10-CM | POA: Diagnosis not present

## 2024-06-15 DIAGNOSIS — R000 Tachycardia, unspecified: Secondary | ICD-10-CM | POA: Diagnosis present

## 2024-06-15 DIAGNOSIS — R400 Somnolence: Secondary | ICD-10-CM

## 2024-06-15 DIAGNOSIS — Z8249 Family history of ischemic heart disease and other diseases of the circulatory system: Secondary | ICD-10-CM

## 2024-06-15 DIAGNOSIS — F0393 Unspecified dementia, unspecified severity, with mood disturbance: Secondary | ICD-10-CM | POA: Diagnosis present

## 2024-06-15 DIAGNOSIS — E860 Dehydration: Secondary | ICD-10-CM | POA: Diagnosis present

## 2024-06-15 DIAGNOSIS — Z825 Family history of asthma and other chronic lower respiratory diseases: Secondary | ICD-10-CM

## 2024-06-15 DIAGNOSIS — Z8619 Personal history of other infectious and parasitic diseases: Secondary | ICD-10-CM

## 2024-06-15 DIAGNOSIS — R5383 Other fatigue: Secondary | ICD-10-CM | POA: Diagnosis not present

## 2024-06-15 DIAGNOSIS — Z833 Family history of diabetes mellitus: Secondary | ICD-10-CM

## 2024-06-15 DIAGNOSIS — I272 Pulmonary hypertension, unspecified: Secondary | ICD-10-CM | POA: Diagnosis present

## 2024-06-15 DIAGNOSIS — J690 Pneumonitis due to inhalation of food and vomit: Secondary | ICD-10-CM | POA: Diagnosis present

## 2024-06-15 DIAGNOSIS — R1310 Dysphagia, unspecified: Secondary | ICD-10-CM | POA: Diagnosis present

## 2024-06-15 DIAGNOSIS — Z8673 Personal history of transient ischemic attack (TIA), and cerebral infarction without residual deficits: Secondary | ICD-10-CM

## 2024-06-15 DIAGNOSIS — J9819 Other pulmonary collapse: Secondary | ICD-10-CM | POA: Diagnosis not present

## 2024-06-15 DIAGNOSIS — Z789 Other specified health status: Secondary | ICD-10-CM | POA: Diagnosis not present

## 2024-06-15 DIAGNOSIS — E162 Hypoglycemia, unspecified: Secondary | ICD-10-CM | POA: Diagnosis not present

## 2024-06-15 DIAGNOSIS — Z7189 Other specified counseling: Secondary | ICD-10-CM | POA: Diagnosis not present

## 2024-06-15 DIAGNOSIS — G4710 Hypersomnia, unspecified: Secondary | ICD-10-CM | POA: Diagnosis not present

## 2024-06-15 LAB — BASIC METABOLIC PANEL WITH GFR
Anion gap: 26 — ABNORMAL HIGH (ref 5–15)
BUN: 64 mg/dL — ABNORMAL HIGH (ref 8–23)
CO2: 16 mmol/L — ABNORMAL LOW (ref 22–32)
Calcium: 8.5 mg/dL — ABNORMAL LOW (ref 8.9–10.3)
Chloride: 105 mmol/L (ref 98–111)
Creatinine, Ser: 2.74 mg/dL — ABNORMAL HIGH (ref 0.44–1.00)
GFR, Estimated: 16 mL/min — ABNORMAL LOW (ref >60)
Glucose, Bld: 773 mg/dL (ref 70–99)
Potassium: 4.7 mmol/L (ref 3.5–5.1)
Sodium: 147 mmol/L — ABNORMAL HIGH (ref 135–145)

## 2024-06-15 LAB — GLUCOSE, CAPILLARY
Glucose-Capillary: 241 mg/dL — ABNORMAL HIGH (ref 70–99)
Glucose-Capillary: 300 mg/dL — ABNORMAL HIGH (ref 70–99)
Glucose-Capillary: 376 mg/dL — ABNORMAL HIGH (ref 70–99)
Glucose-Capillary: 408 mg/dL — ABNORMAL HIGH (ref 70–99)
Glucose-Capillary: 510 mg/dL (ref 70–99)

## 2024-06-15 LAB — CBC WITH DIFFERENTIAL/PLATELET
Abs Immature Granulocytes: 0.06 K/uL (ref 0.00–0.07)
Basophils Absolute: 0 K/uL (ref 0.0–0.1)
Basophils Relative: 0 %
Eosinophils Absolute: 0 K/uL (ref 0.0–0.5)
Eosinophils Relative: 0 %
HCT: 39.7 % (ref 36.0–46.0)
Hemoglobin: 11.7 g/dL — ABNORMAL LOW (ref 12.0–15.0)
Immature Granulocytes: 1 %
Lymphocytes Relative: 9 %
Lymphs Abs: 1.1 K/uL (ref 0.7–4.0)
MCH: 27.9 pg (ref 26.0–34.0)
MCHC: 29.5 g/dL — ABNORMAL LOW (ref 30.0–36.0)
MCV: 94.7 fL (ref 80.0–100.0)
Monocytes Absolute: 1 K/uL (ref 0.1–1.0)
Monocytes Relative: 8 %
Neutro Abs: 10.4 K/uL — ABNORMAL HIGH (ref 1.7–7.7)
Neutrophils Relative %: 82 %
Platelets: 229 K/uL (ref 150–400)
RBC: 4.19 MIL/uL (ref 3.87–5.11)
RDW: 16.1 % — ABNORMAL HIGH (ref 11.5–15.5)
WBC: 12.6 K/uL — ABNORMAL HIGH (ref 4.0–10.5)
nRBC: 0 % (ref 0.0–0.2)

## 2024-06-15 LAB — HEPATIC FUNCTION PANEL
ALT: 11 U/L (ref 0–44)
AST: 14 U/L — ABNORMAL LOW (ref 15–41)
Albumin: 2.5 g/dL — ABNORMAL LOW (ref 3.5–5.0)
Alkaline Phosphatase: 65 U/L (ref 38–126)
Bilirubin, Direct: 0.1 mg/dL (ref 0.0–0.2)
Indirect Bilirubin: 2.1 mg/dL — ABNORMAL HIGH (ref 0.3–0.9)
Total Bilirubin: 2.2 mg/dL — ABNORMAL HIGH (ref 0.0–1.2)
Total Protein: 5.7 g/dL — ABNORMAL LOW (ref 6.5–8.1)

## 2024-06-15 LAB — I-STAT VENOUS BLOOD GAS, ED
Acid-base deficit: 10 mmol/L — ABNORMAL HIGH (ref 0.0–2.0)
Bicarbonate: 16.3 mmol/L — ABNORMAL LOW (ref 20.0–28.0)
Calcium, Ion: 1.14 mmol/L — ABNORMAL LOW (ref 1.15–1.40)
HCT: 35 % — ABNORMAL LOW (ref 36.0–46.0)
Hemoglobin: 11.9 g/dL — ABNORMAL LOW (ref 12.0–15.0)
O2 Saturation: 88 %
Potassium: 4.5 mmol/L (ref 3.5–5.1)
Sodium: 146 mmol/L — ABNORMAL HIGH (ref 135–145)
TCO2: 17 mmol/L — ABNORMAL LOW (ref 22–32)
pCO2, Ven: 35.8 mmHg — ABNORMAL LOW (ref 44–60)
pH, Ven: 7.266 (ref 7.25–7.43)
pO2, Ven: 62 mmHg — ABNORMAL HIGH (ref 32–45)

## 2024-06-15 LAB — CBG MONITORING, ED
Glucose-Capillary: >600 mg/dL (ref 70–99)
Glucose-Capillary: >600 mg/dL (ref 70–99)
Glucose-Capillary: 524 mg/dL (ref 70–99)
Glucose-Capillary: 481 mg/dL — ABNORMAL HIGH (ref 70–99)

## 2024-06-15 LAB — BETA-HYDROXYBUTYRIC ACID
Beta-Hydroxybutyric Acid: >8 mmol/L — ABNORMAL HIGH (ref 0.05–0.27)
Beta-Hydroxybutyric Acid: >8 mmol/L — ABNORMAL HIGH (ref 0.05–0.27)

## 2024-06-15 LAB — COMPREHENSIVE METABOLIC PANEL WITH GFR
ALT: 11 U/L (ref 0–44)
AST: 14 U/L — ABNORMAL LOW (ref 15–41)
Albumin: 2.5 g/dL — ABNORMAL LOW (ref 3.5–5.0)
Alkaline Phosphatase: 65 U/L (ref 38–126)
Anion gap: 23 — ABNORMAL HIGH (ref 5–15)
BUN: 60 mg/dL — ABNORMAL HIGH (ref 8–23)
CO2: 14 mmol/L — ABNORMAL LOW (ref 22–32)
Calcium: 8.3 mg/dL — ABNORMAL LOW (ref 8.9–10.3)
Chloride: 109 mmol/L (ref 98–111)
Creatinine, Ser: 2.54 mg/dL — ABNORMAL HIGH (ref 0.44–1.00)
GFR, Estimated: 17 mL/min — ABNORMAL LOW (ref >60)
Glucose, Bld: 617 mg/dL (ref 70–99)
Potassium: 3.9 mmol/L (ref 3.5–5.1)
Sodium: 146 mmol/L — ABNORMAL HIGH (ref 135–145)
Total Bilirubin: 2.3 mg/dL — ABNORMAL HIGH (ref 0.0–1.2)
Total Protein: 5.7 g/dL — ABNORMAL LOW (ref 6.5–8.1)

## 2024-06-15 LAB — TROPONIN I (HIGH SENSITIVITY)
Troponin I (High Sensitivity): 100 ng/L (ref <18)
Troponin I (High Sensitivity): 129 ng/L (ref <18)

## 2024-06-15 LAB — MAGNESIUM: Magnesium: 1.9 mg/dL (ref 1.7–2.4)

## 2024-06-15 LAB — I-STAT CG4 LACTIC ACID, ED: Lactic Acid, Venous: 3.4 mmol/L (ref 0.5–1.9)

## 2024-06-15 LAB — BRAIN NATRIURETIC PEPTIDE: B Natriuretic Peptide: 444.9 pg/mL — ABNORMAL HIGH (ref 0.0–100.0)

## 2024-06-15 LAB — PROCALCITONIN: Procalcitonin: <0.1 ng/mL

## 2024-06-15 MED ORDER — LACTATED RINGERS IV SOLN: INTRAVENOUS | Status: DC

## 2024-06-15 MED ORDER — SODIUM CHLORIDE 0.45 % IV SOLN: INTRAVENOUS | Status: DC

## 2024-06-15 MED ORDER — LACTATED RINGERS IV BOLUS
20.0000 mL/kg | Freq: Once | INTRAVENOUS | Status: AC
  Administered 2024-06-15: 1520 mL via INTRAVENOUS

## 2024-06-15 MED ORDER — VANCOMYCIN HCL 1500 MG/300ML IV SOLN
1500.0000 mg | Freq: Once | INTRAVENOUS | Status: AC
  Administered 2024-06-15: 1500 mg via INTRAVENOUS
  Filled 2024-06-15 (×2): qty 300

## 2024-06-15 MED ORDER — LACTATED RINGERS IV BOLUS
20.0000 mL/kg | Freq: Once | INTRAVENOUS | Status: AC
  Administered 2024-06-15: 1496 mL via INTRAVENOUS

## 2024-06-15 MED ORDER — LACTATED RINGERS IV BOLUS: 20.0000 mL/kg | Freq: Once | INTRAVENOUS | Status: DC

## 2024-06-15 MED ORDER — DEXTROSE IN LACTATED RINGERS 5 % IV SOLN: INTRAVENOUS | Status: DC

## 2024-06-15 MED ORDER — SODIUM CHLORIDE 0.9 % IV SOLN
2.0000 g | INTRAVENOUS | Status: DC
  Administered 2024-06-15: 2 g via INTRAVENOUS
  Filled 2024-06-15: qty 20

## 2024-06-15 MED ORDER — HYDRALAZINE HCL 20 MG/ML IJ SOLN
10.0000 mg | Freq: Four times a day (QID) | INTRAMUSCULAR | Status: DC | PRN
  Administered 2024-06-16 – 2024-06-17 (×3): 10 mg via INTRAVENOUS
  Filled 2024-06-15 (×2): qty 1

## 2024-06-15 MED ORDER — VANCOMYCIN VARIABLE DOSE PER UNSTABLE RENAL FUNCTION (PHARMACIST DOSING)
Status: DC
Start: 2024-06-15 — End: 2024-06-16

## 2024-06-15 MED ORDER — POTASSIUM CHLORIDE 10 MEQ/100ML IV SOLN
10.0000 meq | INTRAVENOUS | Status: AC
  Administered 2024-06-15 (×2): 10 meq via INTRAVENOUS
  Filled 2024-06-15 (×2): qty 100

## 2024-06-15 MED ORDER — INSULIN REGULAR(HUMAN) IN NACL 100-0.9 UT/100ML-% IV SOLN
INTRAVENOUS | Status: DC
  Administered 2024-06-15: 6.5 [IU]/h via INTRAVENOUS
  Administered 2024-06-16: 2.8 [IU]/h via INTRAVENOUS
  Administered 2024-06-17: 4.8 [IU]/h via INTRAVENOUS
  Filled 2024-06-15 (×3): qty 100

## 2024-06-15 MED ORDER — HEPARIN SODIUM (PORCINE) 5000 UNIT/ML IJ SOLN
5000.0000 [IU] | Freq: Three times a day (TID) | INTRAMUSCULAR | Status: DC
  Administered 2024-06-15: 5000 [IU] via SUBCUTANEOUS
  Filled 2024-06-15: qty 1

## 2024-06-15 MED ORDER — DEXTROSE 50 % IV SOLN: 0.0000 mL | INTRAVENOUS | Status: DC | PRN

## 2024-06-15 NOTE — ED Provider Notes (Signed)
 [place] EMERGENCY DEPARTMENT AT [facility] Provider Note   CSN: [id number] Arrival date & time: 06/15/24  1409     Patient presents with: Hyperglycemia   Kristen Bowman is a 89 y.o. female.   HPI Patient presents from nursing facility with concern for altered mental status. She is initially with EMS, eventually joined by family. Family notes the patient has a history of dementia, diabetes, seizures. EMS reports the patient came from facility after becoming less interactive over the past 12/24 hours.  The patient herself cannot provide any details, level 5 caveat. EMS reports some consideration that the diabetes was not known by the nursing facility and the patient's glucose has been increasing over to her stay.  Daughter notes that the patient is at nursing facility for 5-day respite as the family are primary caregivers for this patient and needed a break.     Prior to Admission medications   Medication Sig Start Date End Date Taking? Authorizing Provider  amLODipine  (NORVASC ) 10 MG tablet Take 1 tablet (10 mg total) by mouth daily. 04/01/24 05/01/24  Christobal Guadalajara, MD  atorvastatin  (LIPITOR) 40 MG tablet Take 40 mg by mouth daily.    [provider]  clonazePAM  (KLONOPIN ) 0.5 MG tablet Take 1 tablet (0.5 mg total) by mouth daily for 3 doses. 03/31/24 04/03/24  Christobal Guadalajara, MD  [facility] GENUINE ASPIRIN  325 MG tablet Take 325 mg by mouth daily. 01/02/21   [provider]  [facility] VITAMIN B12 1000 MCG tablet Take 1,000 mcg by mouth daily.    [provider]  divalproex  (DEPAKOTE  SPRINKLES) 125 MG capsule Take 2 capsules (250 mg total) by mouth 2 (two) times daily. 02/15/24   Wertman, Sara E, PA-C  donepezil  (ARICEPT ) 5 MG tablet Take 1 tablet (5 mg total) by mouth at bedtime. Patient taking differently: Take 5 mg by mouth every evening. 03/05/24   Wertman, Sara E, PA-C  insulin  lispro (HUMALOG  KWIKPEN) 100 UNIT/ML KwikPen Use as directed sliding scale: Blood sugar 0  - 200 give 0 units, Blood sugar 200-250 give 2 units, blood sugar 250-300 give 4 units, blood sugar 300-350 give 6 units, blood sugar 350-400 give 8 units, blood sugar over 400 give 10 units. Patient taking differently: Inject 3-8 Units into the skin 3 (three) times daily. Per sliding scale 12/25/22     lactulose  (CHRONULAC ) 10 GM/15ML solution Take 30 mLs (20 g total) by mouth 2 (two) times daily. 03/31/24   Christobal Guadalajara, MD  LANTUS  SOLOSTAR 100 UNIT/ML Solostar Pen Inject 20 Units into the skin at bedtime. 03/31/24   Christobal Guadalajara, MD  losartan  (COZAAR ) 50 MG tablet Take 50 mg by mouth daily.    [provider]  melatonin 3 MG TABS tablet Take 3 mg by mouth at bedtime.    [provider]  metFORMIN  (GLUCOPHAGE ) 500 MG tablet Take 750 mg by mouth daily with breakfast.    [provider]  Metoprolol  Tartrate 75 MG TABS Take 1 tablet (75 mg total) by mouth 2 (two) times daily. 03/31/24   Christobal Guadalajara, MD  risperiDONE  (RISPERDAL ) 0.5 MG tablet Take 0.5 mg by mouth daily.    [provider]  sertraline  (ZOLOFT ) 50 MG tablet Take 50 mg by mouth daily.    [provider]  traZODone  (DESYREL ) 50 MG tablet Take 50 mg by mouth at bedtime.    [provider]    Allergies: Ace inhibitors and Tape    Review of Systems  Updated  Vital Signs BP (!) 124/36   Pulse 84   Resp 19   SpO2 93%   Physical Exam Vitals and nursing note reviewed.  Constitutional:      General: She is not in acute distress.    Appearance: She is well-developed. She is not ill-appearing or diaphoretic.  HENT:     Head: Normocephalic and atraumatic.  Eyes:     Conjunctiva/sclera: Conjunctivae normal.  Cardiovascular:     Rate and Rhythm: Normal rate and regular rhythm.  Pulmonary:     Effort: Pulmonary effort is normal. No respiratory distress.     Breath sounds: Normal breath sounds. No stridor.  Abdominal:     General: There is no distension.  Skin:    General: Skin is warm and  dry.  Neurological:     Comments: Patient response to stimuli  Psychiatric:        Cognition and Memory: Cognition is impaired. Memory is impaired.     (all labs ordered are listed, but only abnormal results are displayed) Labs Reviewed  CBC WITH DIFFERENTIAL/PLATELET - Abnormal; Notable for the following components:      Result Value   WBC 12.6 (*)    Hemoglobin 11.7 (*)    MCHC 29.5 (*)    RDW 16.1 (*)    Neutro Abs 10.4 (*)    All other components within normal limits  CBG MONITORING, ED - Abnormal; Notable for the following components:   Glucose-Capillary >600 (*)    All other components within normal limits  I-STAT VENOUS BLOOD GAS, ED - Abnormal; Notable for the following components:   pCO2, Ven 35.8 (*)    pO2, Ven 62 (*)    Bicarbonate 16.3 (*)    TCO2 17 (*)    Acid-base deficit 10.0 (*)    Sodium 146 (*)    Calcium , Ion 1.14 (*)    HCT 35.0 (*)    Hemoglobin 11.9 (*)    All other components within normal limits  BASIC METABOLIC PANEL WITH GFR  BASIC METABOLIC PANEL WITH GFR  BASIC METABOLIC PANEL WITH GFR  BASIC METABOLIC PANEL WITH GFR  BETA-HYDROXYBUTYRIC ACID  BETA-HYDROXYBUTYRIC ACID  BETA-HYDROXYBUTYRIC ACID  BETA-HYDROXYBUTYRIC ACID  URINALYSIS, ROUTINE W REFLEX MICROSCOPIC    EKG: None  Radiology: DG Chest Portable 1 View Result Date: 06/15/2024 EXAM: 1 VIEW(S) XRAY OF THE CHEST 06/15/2024 02:33:00 PM COMPARISON: 03/29/2024 CLINICAL HISTORY: ALOC. Triage notes: Pt bib [facility] coming from [facility]. EMS called out for hyperglycemia and lethargy. Pt has been at facility since Friday. Per EMS, facility was unaware that patient is diabetic and pt has not had her blood sugar checked or received ; insulin . Pt lethargic during triage. Patient normally alert and talking at baseline, unsure if patient is normally oriented. Patient SpO2 88% with EMS on RA. High glucose read with EMS (above 600).; ; EMS VS:; 120/40; 80 HR; 90% 2L; 20G LAC; 500 CC NS FINDINGS:  LUNGS AND PLEURA: Improved lung volumes. Central pulmonary vascular congestion without overt pulmonary edema. No focal pulmonary opacity. No pleural effusion. No pneumothorax. HEART AND MEDIASTINUM: Borderline cardiomegaly. Calcified aortic atherosclerosis. BONES AND SOFT TISSUES: No acute osseous abnormality. IMPRESSION: 1. Central pulmonary vascular congestion without overt pulmonary edema. 2. Similar borderline cardiomegaly. Electronically signed by: Donnice Mania MD 06/15/2024 03:10 PM EDT RP Workstation: [id number]     Procedures   Medications Ordered in the ED  lactated ringers  bolus 20 mL/kg (has no administration in time range)  insulin  regular, human (MYXREDLIN) 100  units/ 100 mL infusion (has no administration in time range)  lactated ringers  infusion (has no administration in time range)  dextrose  5 % in lactated ringers  infusion (has no administration in time range)  dextrose  50 % solution 0-50 mL (has no administration in time range)  potassium chloride  10 mEq in 100 mL IVPB (has no administration in time range)  lactated ringers  bolus 1,520 mL (1,520 mLs Intravenous New Bag/Given 06/15/24 1440)                                    Medical Decision Making With concern for hyperglycemia this elderly female with dementia had continuous monitoring, labs fluids started. Point-of-care glucose greater than 600, concern for DKA/nonketotic hyperosmolar state/hyperglycemia consequences. Patient's initial blood gas consistent with this, pH 7.2  Cardiac 85 sinus normal pulse ox 93% borderline  Amount and/or Complexity of Data Reviewed Independent Historian: caregiver and EMS External Data Reviewed: notes. Labs: ordered. Decision-making details documented in ED Course. Radiology: ordered and independent interpretation performed. Decision-making details documented in ED Course.    Details: X-ray noncontributory  Risk Prescription drug management. Decision regarding  hospitalization. Diagnosis or treatment significantly limited by social determinants of health.  Initial findings as above, pH 7.2, point-of-care glucose greater than 600, and with listlessness concern for DKA patient started fluids as above, and insulin  continuous, will require admission.  CRITICAL CARE Performed by: Lamar Salen Total critical care time: 35 minutes Critical care time was exclusive of separately billable procedures and treating other patients. Critical care was necessary to treat or prevent imminent or life-threatening deterioration. Critical care was time spent personally by me on the following activities: development of treatment plan with patient and/or surrogate as well as nursing, discussions with consultants, evaluation of patient's response to treatment, examination of patient, obtaining history from patient or surrogate, ordering and performing treatments and interventions, ordering and review of laboratory studies, ordering and review of radiographic studies, pulse oximetry and re-evaluation of patient's condition.   Final diagnoses:  Diabetic ketoacidosis without coma associated with type 1 diabetes mellitus [facility])    ED Discharge Orders     None          Salen Lamar, MD 06/15/24 1547

## 2024-06-15 NOTE — ED Provider Notes (Signed)
 Care assumed from Dr. Garrick while patient is awaiting workup.  Patient will need admission for hyperglycemia and altered mental status.  She reportedly went to a facility where it was not known that she was a diabetic and thus needed medications for this.  Will call for admission after workup is further.  4:37 PM Workup has begun to return and appears patient is in DKA with anion gap, low CO2, and her pH is 7.2.  Beta hydroxy to gases greater than 8.  The insulin  drip was ordered and we will call for admission for further management of DKA.   Clinical Impression: 1. Diabetic ketoacidosis without coma associated with type 1 diabetes mellitus (HCC)     Disposition: Admit  This note was prepared with assistance of Dragon voice recognition software. Occasional wrong-word or sound-a-like substitutions may have occurred due to the inherent limitations of voice recognition software.      Ceanna Wareing, Lonni PARAS, MD 06/15/24 1739

## 2024-06-15 NOTE — Telephone Encounter (Signed)
 I left message on [name] Caldron daughter emergency contact. [phone] to contact office to discuss patient and discuss DPR

## 2024-06-15 NOTE — H&P (Addendum)
 History and Physical    Patient: Kristen Bowman DOB: 1930-11-09 DOA: 06/15/2024 DOS: 06/15/2024 . PCP: Valentin Skates, DO  Patient coming from: SNF Chief complaint: Chief Complaint  Patient presents with   Hyperglycemia   HPI:  Kristen Bowman is a 89 y.o. female with past medical history  of  diabetes mellitus type 2, hypertension, history of E. coli UTI and associated encephalopathy, right bundle branch block, chronic anemia, chronic kidney disease stage II, anemia,  dementia, depression independent and oriented at baseline come for AMS and hyperglycemia. Hpi is otherwise limited as pt is lethargic. Open her eyes and responds but otherwise is somnolent.  Family at bedside and discussed CODE STATUS and patient is full code.   ED Course:  Vital signs in the ED were notable for the following:  Vitals:   06/15/24 1700 06/15/24 1715 06/15/24 1730 06/15/24 1745  BP: (!) 113/28 (!) 118/35 (!) 131/38 (!) 131/34  Pulse: 70 83 80 83  Temp:      Resp: (!) 26 (!) 23 (!) 21 (!) 24  Height:      Weight:      SpO2: 93% 94% 92% 97%  TempSrc:      BMI (Calculated):       >>ED evaluation thus far shows: Venous blood gas shows pH of 7.2 pCO2 35.8 and pO2 of 62 bicarb 16.3. Initial basic metabolic panel shows sodium 147 bicarb 16 glucose 773 BUN 64 creatinine 2.74 eGFR 16 anion gap of 26. LFTs and lactic acid added on and pending repeat BMP ordered. CBC shows white count of 12.6 hemoglobin of 11.7 platelets 229.   Beta hydroxybutyric acid of more than 8. Respiratory panel ordered and pending. Urinalysis ordered and pending. Chest x-ray done today shows central pulmonary vascular congestion and borderline cardiomegaly. BNP and troponin ordered and pending. EKG ordered and pending.   >>While in the ED patient received the following: Insulin  gtt and ivf per endo tool .  Review of Systems  Unable to perform ROS: Mental status change   Past Medical History:  Diagnosis Date    Anemia    Anxiety states    Diabetes mellitus    T2DM with renal manifestations   Generalized osteoarthrosis, unspecified site    Hematuria    Hyperlipidemia    mixed   Hypertension    Irritable bowel syndrome    Other specified cardiac dysrhythmias(427.89)    atrial fibrilation   Panic disorder    Polymyalgia rheumatica    Proteinuria 05/29/10   Type II or unspecified type diabetes mellitus with renal manifestations, uncontrolled(250.42) 05/29/10   CKD II (mild)   Past Surgical History:  Procedure Laterality Date   CATARACT EXTRACTION     right   COLONOSCOPY N/A 11/04/2012   Procedure: COLONOSCOPY;  Surgeon: Elsie Cree, MD;  Location: [facility];  Service: Endoscopy;  Laterality: N/A;   ESOPHAGOGASTRODUODENOSCOPY N/A 11/04/2012   Procedure: ESOPHAGOGASTRODUODENOSCOPY (EGD);  Surgeon: Elsie Cree, MD;  Location: [facility];  Service: Endoscopy;  Laterality: N/A;   TUBAL LIGATION      reports that she has never smoked. She has never used smokeless tobacco. She reports that she does not drink alcohol and does not use drugs. Allergies  Allergen Reactions   Ace Inhibitors Shortness Of Breath and Swelling   Tape Other (See Comments)    Skin bruises and tears easily   Family History  Problem Relation Age of Onset   Diabetes Sister    Cancer Other  Heart attack Other    Emphysema Mother    Leukemia Father    Prior to Admission medications   Medication Sig Start Date End Date Taking? Authorizing Provider  amLODipine  (NORVASC ) 10 MG tablet Take 1 tablet (10 mg total) by mouth daily. 04/01/24 05/01/24  Christobal Guadalajara, MD  atorvastatin  (LIPITOR) 40 MG tablet Take 40 mg by mouth daily.    [provider]  clonazePAM  (KLONOPIN ) 0.5 MG tablet Take 1 tablet (0.5 mg total) by mouth daily for 3 doses. 03/31/24 04/03/24  Christobal Guadalajara, MD  [facility] GENUINE ASPIRIN  325 MG tablet Take 325 mg by mouth daily. 01/02/21   [provider]  [facility] VITAMIN B12 1000 MCG tablet Take 1,000 mcg  by mouth daily.    [provider]  divalproex  (DEPAKOTE  SPRINKLES) 125 MG capsule Take 2 capsules (250 mg total) by mouth 2 (two) times daily. 02/15/24   Wertman, Sara E, PA-C  donepezil  (ARICEPT ) 5 MG tablet Take 1 tablet (5 mg total) by mouth at bedtime. Patient taking differently: Take 5 mg by mouth every evening. 03/05/24   Wertman, Sara E, PA-C  insulin  lispro (HUMALOG  KWIKPEN) 100 UNIT/ML KwikPen Use as directed sliding scale: Blood sugar 0 - 200 give 0 units, Blood sugar 200-250 give 2 units, blood sugar 250-300 give 4 units, blood sugar 300-350 give 6 units, blood sugar 350-400 give 8 units, blood sugar over 400 give 10 units. Patient taking differently: Inject 3-8 Units into the skin 3 (three) times daily. Per sliding scale 12/25/22     lactulose  (CHRONULAC ) 10 GM/15ML solution Take 30 mLs (20 g total) by mouth 2 (two) times daily. 03/31/24   Christobal Guadalajara, MD  LANTUS  SOLOSTAR 100 UNIT/ML Solostar Pen Inject 20 Units into the skin at bedtime. 03/31/24   Christobal Guadalajara, MD  losartan  (COZAAR ) 50 MG tablet Take 50 mg by mouth daily.    [provider]  melatonin 3 MG TABS tablet Take 3 mg by mouth at bedtime.    [provider]  metFORMIN  (GLUCOPHAGE ) 500 MG tablet Take 750 mg by mouth daily with breakfast.    [provider]  Metoprolol  Tartrate 75 MG TABS Take 1 tablet (75 mg total) by mouth 2 (two) times daily. 03/31/24   Christobal Guadalajara, MD  risperiDONE  (RISPERDAL ) 0.5 MG tablet Take 0.5 mg by mouth daily.    [provider]  sertraline  (ZOLOFT ) 50 MG tablet Take 50 mg by mouth daily.    [provider]  traZODone  (DESYREL ) 50 MG tablet Take 50 mg by mouth at bedtime.    [provider]                                                                                 Vitals:   06/15/24 1700 06/15/24 1715 06/15/24 1730 06/15/24 1745  BP: (!) 113/28 (!) 118/35 (!) 131/38 (!) 131/34  Pulse: 70 83 80 83  Resp: (!) 26 (!) 23 (!) 21 (!) 24  Temp:       TempSrc:      SpO2: 93% 94% 92% 97%  Weight:      Height:       Physical Exam Vitals reviewed.  Constitutional:      General: She is not in acute distress.    Appearance: She is ill-appearing.  HENT:     Head: Normocephalic.     Mouth/Throat:     Mouth: Mucous membranes are dry.  Eyes:     Extraocular Movements: Extraocular movements intact.  Cardiovascular:     Rate and Rhythm: Regular rhythm. Tachycardia present.     Heart sounds: Normal heart sounds.  Pulmonary:     Breath sounds: Rales present.  Abdominal:     General: There is no distension.     Palpations: Abdomen is soft.     Tenderness: There is no abdominal tenderness.  Musculoskeletal:     Right lower leg: No edema.     Left lower leg: No edema.  Neurological:     Mental Status: She is lethargic.     Cranial Nerves: Cranial nerves 2-12 are intact. No facial asymmetry.     Labs on Admission: I have personally reviewed following labs and imaging studies CBC: Recent Labs  Lab 06/15/24 1440 06/15/24 1450  WBC 12.6*  --   NEUTROABS 10.4*  --   HGB 11.7* 11.9*  HCT 39.7 35.0*  MCV 94.7  --   PLT 229  --    Basic Metabolic Panel: Recent Labs  Lab 06/15/24 1440 06/15/24 1450  NA 147* 146*  K 4.7 4.5  CL 105  --   CO2 16*  --   GLUCOSE 773*  --   BUN 64*  --   CREATININE 2.74*  --   CALCIUM  8.5*  --    GFR: Estimated Creatinine Clearance: 13.8 mL/min (A) (by C-G formula based on SCr of 2.74 mg/dL (H)). Liver Function Tests: No results for input(s): AST, ALT, ALKPHOS, BILITOT, PROT, ALBUMIN  in the last 168 hours. No results for input(s): LIPASE, AMYLASE in the last 168 hours. No results for input(s): AMMONIA in the last 168 hours. Recent Labs    03/26/24 2005 03/27/24 0957 03/28/24 0919 03/29/24 0437 03/30/24 0431 06/15/24 1440  BUN 14 13 10 9  7* 64*  CREATININE 1.01* 0.84 0.91 0.89 0.82 2.74*    Cardiac Enzymes: No results for input(s): CKTOTAL, CKMB, CKMBINDEX,  TROPONINI in the last 168 hours. BNP (last 3 results) No results for input(s): PROBNP in the last 8760 hours. HbA1C: No results for input(s): HGBA1C in the last 72 hours. CBG: Recent Labs  Lab 06/15/24 1436 06/15/24 1646 06/15/24 1744  GLUCAP >600* >600* 524*   Lipid Profile: No results for input(s): CHOL, HDL, LDLCALC, TRIG, CHOLHDL, LDLDIRECT in the last 72 hours. Thyroid  Function Tests: No results for input(s): TSH, T4TOTAL, FREET4, T3FREE, THYROIDAB in the last 72 hours. Anemia Panel: No results for input(s): VITAMINB12, FOLATE, FERRITIN, TIBC, IRON , RETICCTPCT in the last 72 hours. Urine analysis:    Component Value Date/Time   COLORURINE YELLOW 03/26/2024 2035   APPEARANCEUR TURBID (A) 03/26/2024 2035   LABSPEC 1.014 03/26/2024 2035   PHURINE 5.0 03/26/2024 2035   GLUCOSEU NEGATIVE 03/26/2024 2035   HGBUR SMALL (A) 03/26/2024 2035   BILIRUBINUR NEGATIVE 03/26/2024 2035   KETONESUR NEGATIVE 03/26/2024 2035   PROTEINUR 100 (A) 03/26/2024 2035   UROBILINOGEN 0.2 09/24/2012 2220   NITRITE NEGATIVE 03/26/2024 2035   LEUKOCYTESUR MODERATE (A) 03/26/2024 2035   Radiological Exams on Admission: DG Chest Portable 1 View Result Date: 06/15/2024 EXAM: 1 VIEW(S) XRAY OF THE CHEST 06/15/2024 02:33:00 PM COMPARISON: 03/29/2024 CLINICAL HISTORY: ALOC. Triage notes: Pt bib [facility] coming from [facility]. EMS  called out for hyperglycemia and lethargy. Pt has been at facility since Friday. Per EMS, facility was unaware that patient is diabetic and pt has not had her blood sugar checked or received ; insulin . Pt lethargic during triage. Patient normally alert and talking at baseline, unsure if patient is normally oriented. Patient SpO2 88% with EMS on RA. High glucose read with EMS (above 600).; ; EMS VS:; 120/40; 80 HR; 90% 2L; 20G LAC; 500 CC NS FINDINGS: LUNGS AND PLEURA: Improved lung volumes. Central pulmonary vascular congestion without overt  pulmonary edema. No focal pulmonary opacity. No pleural effusion. No pneumothorax. HEART AND MEDIASTINUM: Borderline cardiomegaly. Calcified aortic atherosclerosis. BONES AND SOFT TISSUES: No acute osseous abnormality. IMPRESSION: 1. Central pulmonary vascular congestion without overt pulmonary edema. 2. Similar borderline cardiomegaly. Electronically signed by: Donnice Mania MD 06/15/2024 03:10 PM EDT RP Workstation: [id number]   Data Reviewed: Relevant notes from primary care and specialist visits, past discharge summaries as available in EHR, including Care Everywhere . Prior diagnostic testing as pertinent to current admission diagnoses, Updated medications and problem lists for reconciliation .ED course, including vitals, labs, imaging, treatment and response to treatment,Triage notes, nursing and pharmacy notes and ED provider's notes.Notable results as noted in HPI.Discussed case with EDMD/ ED APP/ or Specialty MD on call and as needed.  Assessment & Plan    >>AMS: 2/2 metabolic encephalopathy from DKA / AKI on CKD stage II / dehydration/ Sepsis: Aspiration / fall precaution. NPO. We will obtain blood and urine cultures. And start rocephin  to empirically cover for PNA/UTI/ but as pt was in STR may need MRSA coverage.   >>Suspect Sepsis with organ dysfunction: Source is unclear, but could be PNA/UTI. We will obtain cultures and procalcitonin is pending.    >>Acute hypoxic respiratory failure: Pt is on 2 L Walker. Repeat blood gas is pending. MIVF decreased to 75 cc/hour.  Strict I/O.   >>DKA/ DM II: Pt presents in moderate to severe DKA with bicarb AG and elevated BHB. Cont insulin  gtt per endo tool.  Pt at bedside has audible rales and suspect volume overload possibly from IVF rehydration.  Monitor electrolytes / bicarb/ AG/ kidney function with q4h BMP. Replace.   >> Dementia without behavioral disturbance/anxiety and depression: PTA meds include Aricept , Zoloft , risperidone ,  trazodone .  Depakote .  Currently will hold as patient is lethargic and is n.p.o. can resume once patient is alert passes swallow evaluation.   >> Anemia:  Attributed to anemia of chronic kidney disease.  Will follow H&H CBC type and screen IV PPI.    >> History of CVA: Nonfocal based on history the patient at baseline uses a cane to ambulate.   >> History of pressure injury and sacral bedsore: Resolved and will take precautions for prevention of same.    DVT prophylaxis:  Heparin  every 12 Consults:  None  Advance Care Planning:  Full Code.  Family Communication:  Daughter.  Disposition Plan:  To be determined Severity of Illness: The appropriate patient status for this patient is INPATIENT. Inpatient status is judged to be reasonable and necessary in order to provide the required intensity of service to ensure the patient's safety. The patient's presenting symptoms, physical exam findings, and initial radiographic and laboratory data in the context of their chronic comorbidities is felt to place them at high risk for further clinical deterioration. Furthermore, it is not anticipated that the patient will be medically stable for discharge from the hospital within 2 midnights of admission.   * I certify  that at the point of admission it is my clinical judgment that the patient will require inpatient hospital care spanning beyond 2 midnights from the point of admission due to high intensity of service, high risk for further deterioration and high frequency of surveillance required.*  Unresulted Labs (From admission, onward)     Start     Ordered   06/15/24 1837  MRSA culture  Once,   R        06/15/24 1836   06/15/24 1834  Respiratory (~20 pathogens) panel by PCR  (Respiratory panel by PCR (~20 pathogens, ~24 hr TAT)  w precautions)  Once,   R        06/15/24 1834   06/15/24 1818  Blood culture (routine x 2)  BLOOD CULTURE X 2,   R      06/15/24 1817   06/15/24 1818   Urinalysis, w/ Reflex to Culture (Infection Suspected) -Urine, Random  (Urine Labs)  Add-on,   AD       Question:  Specimen Source  Answer:  Urine, Random   06/15/24 1817   06/15/24 1754  Brain natriuretic peptide  Add-on,   AD        06/15/24 1753   06/15/24 1754  Procalcitonin  Add-on,   AD       References:    Procalcitonin Lower Respiratory Tract Infection AND Sepsis Procalcitonin Algorithm   06/15/24 1753   06/15/24 1753  Hepatic function panel  Add-on,   AD        06/15/24 1753   06/15/24 1726  Hemoglobin A1c  (Diabetes Ketoacidosis (DKA))  Once,   R       Comments: To assess prior glycemic control.    06/15/24 1727   06/15/24 1714  Comprehensive metabolic panel with GFR  ONCE - STAT,   STAT        06/15/24 1714   06/15/24 1714  Magnesium  ONCE - STAT,   STAT        06/15/24 1714   06/15/24 1416  Basic metabolic panel  (Diabetes Ketoacidosis (DKA))  STAT Now then every 4 hours ,   STAT      06/15/24 1415   06/15/24 1416  Beta-hydroxybutyric acid  (Diabetes Ketoacidosis (DKA))  Now then every 4 hours,   STAT      06/15/24 1415   06/15/24 1416  Urinalysis, Routine w reflex microscopic -Urine, Catheterized  (Diabetes Ketoacidosis (DKA))  ONCE - STAT,   URGENT       Question:  Specimen Source  Answer:  Urine, Catheterized   06/15/24 1415            Meds ordered this encounter  Medications   DISCONTD: lactated ringers  bolus 20 mL/kg   lactated ringers  bolus 1,520 mL   lactated ringers  bolus 1,496 mL   insulin  regular, human (MYXREDLIN) 100 units/ 100 mL infusion    EndoTool Goal Range::   140-180    Type of Diabetes:   Type 1    Mode of Therapy:   ENDOX1 for DKA    Start Method:   EndoTool to calculate   DISCONTD: lactated ringers  infusion   dextrose  5 % in lactated ringers  infusion   dextrose  50 % solution 0-50 mL   potassium chloride  10 mEq in 100 mL IVPB   heparin  injection 5,000 Units   hydrALAZINE  (APRESOLINE ) injection 10 mg   0.45 % sodium chloride  infusion    cefTRIAXone  (ROCEPHIN ) 2 g in sodium chloride  0.9 %  100 mL IVPB    Please confirm patient has had blood and urine cultures prior to antibiotic administration.    Antibiotic Indication::   Other Indication (list below)    Other Indication::   Suspected UTI or pneumonia     Orders Placed This Encounter  Procedures   Blood culture (routine x 2)   Respiratory (~20 pathogens) panel by PCR   MRSA culture   DG Chest Portable 1 View   Basic metabolic panel   Beta-hydroxybutyric acid   CBC with Differential (PNL)   Urinalysis, Routine w reflex microscopic -Urine, Catheterized   Comprehensive metabolic panel with GFR   Magnesium   Hemoglobin A1c   Hepatic function panel   Brain natriuretic peptide   Procalcitonin   Urinalysis, w/ Reflex to Culture (Infection Suspected) -Urine, Random   Diet NPO time specified   ED Cardiac monitoring   Initiate Carrier Fluid Protocol   Notify physician (specify)   If present, discontinue Insulin  Pump after IV Insulin  is initiated.   Do NOT use lab glucose values in EndoTool.  If CBG meter reads Critical High, enter 600.   IV insulin  infusion with sufficient glucose should be continued until MD determines acidosis is corrected and places transition orders.   Upon IV fluid bolus completion, place order for STAT BMET (LAB15) and call provider with results.   Do NOT use lab glucose values in EndoTool.  If CBG meter reads Critical High, enter 600.   IV insulin  infusion with sufficient glucose should be continued until MD determines acidosis is corrected and places transition orders.   Upon IV fluid bolus completion, place order for STAT BMET (LAB15) and call provider with results.   Vital signs   Mobility Protocol: No Restrictions   Refer to Sidebar Report Mobility Protocol for Adult Inpatient   Apply Diabetes Mellitus Care Plan   Apply Diabetic Ketoacidosis Care Plan   Notify physician (specify)   If present, discontinue Insulin  Pump after IV Insulin  is  initiated.   Do NOT use lab glucose values in EndoTool.  If CBG meter reads Critical High, enter 600.   Please refer to DKA Protocol sidebar report   Strict intake and output   Initiate Oral Care Protocol   Initiate Carrier Fluid Protocol   Notify physician (specify)   RN may order General Admission PRN Orders utilizing General Admission PRN medications (through manage orders) for the following patient needs: allergy symptoms (Claritin), cold sores (Carmex), cough (Robitussin DM), eye irritation (Liquifilm Tears), hemorrhoids (Tucks), indigestion (Maalox), minor skin irritation (Hydrocortisone Cream), muscle pain (Ben Gay), nose irritation (saline nasal spray) and sore throat (Chloraseptic spray).   K+ > 5 mEq/L and/or K+ addressed separately   Cardiac Monitoring - Continuous Indefinite   Insert temp foley   Strict intake and output   Full code   Consult to hospitalist   Consult to diabetes coordinator   Consult to Transition of Care Team   Consult to intensivist   vancomycin  per pharmacy consult   Droplet precaution   ED Pulse oximetry, continuous   Oxygen therapy Mode or (Route): Nasal cannula; Liters Per Minute: 2; Keep O2 saturation between: 92-94%.   CBG monitoring, ED   CBG monitoring, ED   I-Stat Venous Blood Gas, ED   CBG monitoring, ED   I-Stat CG4 Lactic Acid   CBG monitoring, ED   EKG 12-Lead   Insert peripheral IV   Admit to Inpatient (patient's expected length of stay will be greater than 2 midnights or  inpatient only procedure)   Aspiration precautions   Fall precautions   Skin care precautions    Author: Mario LULLA Blanch, MD 12 pm- 8 pm. [facility]. 06/15/2024 6:49 PM Please note for any communication after hours contact [facility] Assigned provider on call on Amion.

## 2024-06-15 NOTE — Progress Notes (Signed)
 Pharmacy Antibiotic Note  Kristen Bowman is a 89 y.o. female admitted on 06/15/2024 with sepsis.  Pharmacy has been consulted for vancomycin  dosing. Patient presented from facility for hyperglycemia and lethargy. Patient now with suspected sepsis. Of note, patient in DKA with last glucose 524, anion gap 26, BHB > 8, pH 7.266. Lactate 3.4, WBC 12.6. Patient also in AKI with Cr 2.74 (last 0.82 on 03/30/24). Antimicrobials so far have included ceftriaxone .   Plan: -Give vancomycin  loading dose 1500 mg.  -Hold maintenance dosing for now/plan for spot dosing given AKI in setting of DKA and possible sepsis.  -Monitor renal function, s/sx infection, culture results and opportunities for de-escalation as available -Check vancomycin  levels as indicated  Height: 5' 6 (167.6 cm) Weight: 74.8 kg (165 lb) IBW/kg (Calculated) : 59.3  Temp (24hrs), Avg:98 F (36.7 C), Min:98 F (36.7 C), Max:98 F (36.7 C)  Recent Labs  Lab 06/15/24 1440 06/15/24 1801  WBC 12.6*  --   CREATININE 2.74*  --   LATICACIDVEN  --  3.4*    Estimated Creatinine Clearance: 13.8 mL/min (A) (by C-G formula based on SCr of 2.74 mg/dL (H)).    Allergies  Allergen Reactions   Ace Inhibitors Shortness Of Breath and Swelling   Tape Other (See Comments)    Skin bruises and tears easily   Antimicrobials this admission: Vancomycin  10/21>> Ceftriaxone  1021>>  Dose adjustments this admission: None  Microbiology results: 10/21 MRSA culture pending 10/21 Resp pathogen panel pending 10/21 BCx pending  Thank you for allowing pharmacy to be a part of this patient's care.  Maurilio Patten, PharmD PGY1 Pharmacy Resident [facility] 06/15/2024 6:52 PM

## 2024-06-15 NOTE — Consult Note (Signed)
 NAME:  Kristen Bowman, MRN:  [id number], DOB:  08-Jun-1931, LOS: 0 ADMISSION DATE:  06/15/2024, CONSULTATION DATE:  06/15/24 REFERRING MD:  Dr. Tobie, CHIEF COMPLAINT:  DKA/ encephalopathy   History of Present Illness:  Pt encephalopathic, therefore HPI obtained from EMR.    89 yoF with PMH of dementia, seizures, DM, CKD II, HTN, IDA, HLD, RBBB, chronic diarrhea, panic disorder, hyperammonemia, poor mobility, an d UTI (most recently treated 05/21/24), presented from SNF with progressively altered mental status over last day and hyperglycemia.  Was at nursing facility for 5 day respite as family primary caregivers and some concern facility was not aware of existing DM.  At baseline is alert, talking, and oriented to self.   On arrival to ER, pt afebrile, NSR 70, normotensive, and sats 88% RA initially with EMS and tachypneic in 20's.  Labs noted for corrected Na 158, glucose 773, Co2 16, AG 26, BHA > 8, BUN/ sCr 64/ 2.74, WBC 12.6, blood gas 7.266/ 35.8/ 62/ 16.3.  UA not collected. CXR noted for vascular congestion. Treated with 3L fluid and insulin  gtt started to be admitted to [facility] but given worsening encephalopathy and concern for airway protection with repeat CMET pending and lactic after fluids of 3.6, PCCM consulted.    Pertinent  Medical History  Dementia, seizures, DM, CKD II, HTN, IDA, HLD, RBBB, chronic diarrhea, panic disorder, hyperammonemia, poor mobility    Significant Hospital Events: Including procedures, antibiotic start and stop dates in addition to other pertinent events     Interim History / Subjective:  Family has reversed code status  Objective    Blood pressure (!) 131/34, pulse 83, temperature 98 F (36.7 C), temperature source Axillary, resp. rate (!) 24, height 5' 6 (1.676 m), weight 74.8 kg, SpO2 97%.        Intake/Output Summary (Last 24 hours) at 06/15/2024 1855 Last data filed at 06/15/2024 1759 Gross per 24 hour  Intake 1525.52 ml  Output --  Net 1525.52  ml   Filed Weights   06/15/24 1552  Weight: 74.8 kg    Examination: General: chronically and acutely ill elderly F  HENT: NCAT [place] in place  Lungs: even unlabored  Cardiovascular: rr Abdomen: soft  Extremities: no obviousa cute joint deformity  Neuro: awakens to voice, mumbles  GU: defer  Resolved problem list   Assessment and Plan   DKA DMT2 Metabolic encephalopathy  AKI on CKD II Lactic acidosis Hypernatremia  Leukocytosis  Dementia Anxiety - klonopin  0.5 TID baseline Hx seizures - depakote   Anemia HTN HLD Hx DNR // DNR discussion  P: -  ok to be admitted to PCU, is protecting her airway.  -recommend ongoing code status/ GOC discussions -- has a DNR form that family rescinded at time of current presentation. I believe that DNR/I would be appropriate and conveyed this to family, who needs time to think.  - BMETs and BHA q4 - insulin  gtt per endotool - MIVF, might fare ok w addl bolus -- defer to primary - check UA, follow BC, RVP and trend WBC/ fever curve.  Afebrile, leukocytosis could be reactive - NPO while altered, hold pta meds  - hold pta norvasc , statin, losartan  - hold donepezil , risperidone , zoloft , trazodone    Labs   CBC: Recent Labs  Lab 06/15/24 1440 06/15/24 1450  WBC 12.6*  --   NEUTROABS 10.4*  --   HGB 11.7* 11.9*  HCT 39.7 35.0*  MCV 94.7  --   PLT 229  --  Basic Metabolic Panel: Recent Labs  Lab 06/15/24 1440 06/15/24 1450  NA 147* 146*  K 4.7 4.5  CL 105  --   CO2 16*  --   GLUCOSE 773*  --   BUN 64*  --   CREATININE 2.74*  --   CALCIUM  8.5*  --    GFR: Estimated Creatinine Clearance: 13.8 mL/min (A) (by C-G formula based on SCr of 2.74 mg/dL (H)). Recent Labs  Lab 06/15/24 1440 06/15/24 1801  WBC 12.6*  --   LATICACIDVEN  --  3.4*    Liver Function Tests: No results for input(s): AST, ALT, ALKPHOS, BILITOT, PROT, ALBUMIN  in the last 168 hours. No results for input(s): LIPASE, AMYLASE in the  last 168 hours. No results for input(s): AMMONIA in the last 168 hours.  ABG    Component Value Date/Time   HCO3 16.3 (L) 06/15/2024 1450   TCO2 17 (L) 06/15/2024 1450   ACIDBASEDEF 10.0 (H) 06/15/2024 1450   O2SAT 88 06/15/2024 1450     Coagulation Profile: No results for input(s): INR, PROTIME in the last 168 hours.  Cardiac Enzymes: No results for input(s): CKTOTAL, CKMB, CKMBINDEX, TROPONINI in the last 168 hours.  HbA1C: Hgb A1c MFr Bld  Date/Time Value Ref Range Status  09/25/2012 04:35 AM 7.0 (H) <5.7 % Final    Comment:    (NOTE)                                                                       According to the ADA Clinical Practice Recommendations for 2011, when HbA1c is used as a screening test:  >=6.5%   Diagnostic of Diabetes Mellitus           (if abnormal result is confirmed) 5.7-6.4%   Increased risk of developing Diabetes Mellitus References:Diagnosis and Classification of Diabetes Mellitus,Diabetes Care,2011,34(Suppl 1):S62-S69 and Standards of Medical Care in         Diabetes - 2011,Diabetes Care,2011,34 (Suppl 1):S11-S61.    CBG: Recent Labs  Lab 06/15/24 1436 06/15/24 1646 06/15/24 1744  GLUCAP >600* >600* 524*    Review of Systems:   Unable given encephalopathy   Past Medical History:  She,  has a past medical history of Anemia, Anxiety states, Diabetes mellitus, Generalized osteoarthrosis, unspecified site, Hematuria, Hyperlipidemia, Hypertension, Irritable bowel syndrome, Other specified cardiac dysrhythmias(427.89), Panic disorder, Polymyalgia rheumatica, Proteinuria (05/29/10), and Type II or unspecified type diabetes mellitus with renal manifestations, uncontrolled(250.42) (05/29/10).   Surgical History:   Past Surgical History:  Procedure Laterality Date   CATARACT EXTRACTION     right   COLONOSCOPY N/A 11/04/2012   Procedure: COLONOSCOPY;  Surgeon: Elsie Cree, MD;  Location: [facility];  Service: Endoscopy;   Laterality: N/A;   ESOPHAGOGASTRODUODENOSCOPY N/A 11/04/2012   Procedure: ESOPHAGOGASTRODUODENOSCOPY (EGD);  Surgeon: Elsie Cree, MD;  Location: [facility];  Service: Endoscopy;  Laterality: N/A;   TUBAL LIGATION       Social History:   reports that she has never smoked. She has never used smokeless tobacco. She reports that she does not drink alcohol and does not use drugs.   Family History:  Her family history includes Cancer in an other family member; Diabetes in her sister; Emphysema in her mother; Heart attack in  an other family member; Leukemia in her father.   Allergies Allergies  Allergen Reactions   Ace Inhibitors Shortness Of Breath and Swelling   Tape Other (See Comments)    Skin bruises and tears easily     Home Medications  Prior to Admission medications   Medication Sig Start Date End Date Taking? Authorizing Provider  amLODipine  (NORVASC ) 10 MG tablet Take 1 tablet (10 mg total) by mouth daily. 04/01/24 05/01/24  Christobal Guadalajara, MD  atorvastatin  (LIPITOR) 40 MG tablet Take 40 mg by mouth daily.    [provider]  clonazePAM  (KLONOPIN ) 0.5 MG tablet Take 1 tablet (0.5 mg total) by mouth daily for 3 doses. 03/31/24 04/03/24  Christobal Guadalajara, MD  [facility] GENUINE ASPIRIN  325 MG tablet Take 325 mg by mouth daily. 01/02/21   [provider]  [facility] VITAMIN B12 1000 MCG tablet Take 1,000 mcg by mouth daily.    [provider]  divalproex  (DEPAKOTE  SPRINKLES) 125 MG capsule Take 2 capsules (250 mg total) by mouth 2 (two) times daily. 02/15/24   Wertman, Sara E, PA-C  donepezil  (ARICEPT ) 5 MG tablet Take 1 tablet (5 mg total) by mouth at bedtime. Patient taking differently: Take 5 mg by mouth every evening. 03/05/24   Wertman, Sara E, PA-C  insulin  lispro (HUMALOG  KWIKPEN) 100 UNIT/ML KwikPen Use as directed sliding scale: Blood sugar 0 - 200 give 0 units, Blood sugar 200-250 give 2 units, blood sugar 250-300 give 4 units, blood sugar 300-350 give 6 units, blood sugar  350-400 give 8 units, blood sugar over 400 give 10 units. Patient taking differently: Inject 3-8 Units into the skin 3 (three) times daily. Per sliding scale 12/25/22     lactulose  (CHRONULAC ) 10 GM/15ML solution Take 30 mLs (20 g total) by mouth 2 (two) times daily. 03/31/24   Christobal Guadalajara, MD  LANTUS  SOLOSTAR 100 UNIT/ML Solostar Pen Inject 20 Units into the skin at bedtime. 03/31/24   Christobal Guadalajara, MD  losartan  (COZAAR ) 50 MG tablet Take 50 mg by mouth daily.    [provider]  melatonin 3 MG TABS tablet Take 3 mg by mouth at bedtime.    [provider]  metFORMIN  (GLUCOPHAGE ) 500 MG tablet Take 750 mg by mouth daily with breakfast.    [provider]  Metoprolol  Tartrate 75 MG TABS Take 1 tablet (75 mg total) by mouth 2 (two) times daily. 03/31/24   Christobal Guadalajara, MD  risperiDONE  (RISPERDAL ) 0.5 MG tablet Take 0.5 mg by mouth daily.    [provider]  sertraline  (ZOLOFT ) 50 MG tablet Take 50 mg by mouth daily.    [provider]  traZODone  (DESYREL ) 50 MG tablet Take 50 mg by mouth at bedtime.    [provider]     Critical care time:      High MDM    Ronnald Gave MSN, AGACNP-BC [facility] Pulmonary/Critical Care Medicine Amion for pager  06/15/2024, 6:55 PM

## 2024-06-15 NOTE — ED Triage Notes (Signed)
 Pt bib [facility] coming from [facility]. EMS called out for hyperglycemia and lethargy. Pt has been at facility since Friday. Per EMS, facility was unaware that patient is diabetic and pt has not had her blood sugar checked or received insulin . Pt lethargic during triage. Patient normally alert and talking at baseline, unsure if patient is normally oriented. Patient SpO2 88% with EMS on RA. High glucose read with EMS (above 600).  EMS VS: 120/40 80 HR 90% 2L 20G LAC 500 CC NS

## 2024-06-15 NOTE — ED Notes (Signed)
 Spoke to 6E, approval for transport to inpatient Unit

## 2024-06-16 ENCOUNTER — Encounter (HOSPITAL_COMMUNITY): Payer: Self-pay | Admitting: Internal Medicine

## 2024-06-16 ENCOUNTER — Inpatient Hospital Stay (HOSPITAL_COMMUNITY)

## 2024-06-16 ENCOUNTER — Other Ambulatory Visit: Payer: Self-pay

## 2024-06-16 DIAGNOSIS — A419 Sepsis, unspecified organism: Secondary | ICD-10-CM | POA: Diagnosis present

## 2024-06-16 DIAGNOSIS — R7989 Other specified abnormal findings of blood chemistry: Secondary | ICD-10-CM | POA: Diagnosis not present

## 2024-06-16 DIAGNOSIS — J9600 Acute respiratory failure, unspecified whether with hypoxia or hypercapnia: Secondary | ICD-10-CM | POA: Diagnosis not present

## 2024-06-16 DIAGNOSIS — N1831 Chronic kidney disease, stage 3a: Secondary | ICD-10-CM | POA: Diagnosis present

## 2024-06-16 DIAGNOSIS — I272 Pulmonary hypertension, unspecified: Secondary | ICD-10-CM | POA: Diagnosis present

## 2024-06-16 DIAGNOSIS — F0394 Unspecified dementia, unspecified severity, with anxiety: Secondary | ICD-10-CM | POA: Diagnosis present

## 2024-06-16 DIAGNOSIS — R6520 Severe sepsis without septic shock: Secondary | ICD-10-CM | POA: Diagnosis present

## 2024-06-16 DIAGNOSIS — M353 Polymyalgia rheumatica: Secondary | ICD-10-CM | POA: Diagnosis present

## 2024-06-16 DIAGNOSIS — L89153 Pressure ulcer of sacral region, stage 3: Secondary | ICD-10-CM | POA: Diagnosis present

## 2024-06-16 DIAGNOSIS — N179 Acute kidney failure, unspecified: Secondary | ICD-10-CM

## 2024-06-16 DIAGNOSIS — Z7189 Other specified counseling: Secondary | ICD-10-CM | POA: Diagnosis not present

## 2024-06-16 DIAGNOSIS — F32A Depression, unspecified: Secondary | ICD-10-CM | POA: Diagnosis present

## 2024-06-16 DIAGNOSIS — E1022 Type 1 diabetes mellitus with diabetic chronic kidney disease: Secondary | ICD-10-CM | POA: Diagnosis present

## 2024-06-16 DIAGNOSIS — R4182 Altered mental status, unspecified: Secondary | ICD-10-CM

## 2024-06-16 DIAGNOSIS — F0393 Unspecified dementia, unspecified severity, with mood disturbance: Secondary | ICD-10-CM | POA: Diagnosis present

## 2024-06-16 DIAGNOSIS — I700 Atherosclerosis of aorta: Secondary | ICD-10-CM | POA: Diagnosis present

## 2024-06-16 DIAGNOSIS — E46 Unspecified protein-calorie malnutrition: Secondary | ICD-10-CM | POA: Diagnosis present

## 2024-06-16 DIAGNOSIS — I2489 Other forms of acute ischemic heart disease: Secondary | ICD-10-CM | POA: Diagnosis present

## 2024-06-16 DIAGNOSIS — R0603 Acute respiratory distress: Secondary | ICD-10-CM

## 2024-06-16 DIAGNOSIS — J690 Pneumonitis due to inhalation of food and vomit: Secondary | ICD-10-CM | POA: Diagnosis present

## 2024-06-16 DIAGNOSIS — E876 Hypokalemia: Secondary | ICD-10-CM

## 2024-06-16 DIAGNOSIS — F0390 Unspecified dementia without behavioral disturbance: Secondary | ICD-10-CM | POA: Diagnosis not present

## 2024-06-16 DIAGNOSIS — L8990 Pressure ulcer of unspecified site, unspecified stage: Secondary | ICD-10-CM | POA: Insufficient documentation

## 2024-06-16 DIAGNOSIS — Z515 Encounter for palliative care: Secondary | ICD-10-CM | POA: Diagnosis not present

## 2024-06-16 DIAGNOSIS — L89303 Pressure ulcer of unspecified buttock, stage 3: Secondary | ICD-10-CM | POA: Diagnosis not present

## 2024-06-16 DIAGNOSIS — G9340 Encephalopathy, unspecified: Secondary | ICD-10-CM | POA: Diagnosis not present

## 2024-06-16 DIAGNOSIS — G9341 Metabolic encephalopathy: Secondary | ICD-10-CM | POA: Diagnosis present

## 2024-06-16 DIAGNOSIS — N390 Urinary tract infection, site not specified: Secondary | ICD-10-CM | POA: Diagnosis present

## 2024-06-16 DIAGNOSIS — E870 Hyperosmolality and hypernatremia: Secondary | ICD-10-CM

## 2024-06-16 DIAGNOSIS — D631 Anemia in chronic kidney disease: Secondary | ICD-10-CM | POA: Diagnosis present

## 2024-06-16 DIAGNOSIS — F03918 Unspecified dementia, unspecified severity, with other behavioral disturbance: Secondary | ICD-10-CM | POA: Diagnosis present

## 2024-06-16 DIAGNOSIS — E1010 Type 1 diabetes mellitus with ketoacidosis without coma: Secondary | ICD-10-CM | POA: Diagnosis present

## 2024-06-16 DIAGNOSIS — J9601 Acute respiratory failure with hypoxia: Secondary | ICD-10-CM

## 2024-06-16 DIAGNOSIS — N182 Chronic kidney disease, stage 2 (mild): Secondary | ICD-10-CM

## 2024-06-16 DIAGNOSIS — E1110 Type 2 diabetes mellitus with ketoacidosis without coma: Secondary | ICD-10-CM | POA: Diagnosis not present

## 2024-06-16 DIAGNOSIS — Z66 Do not resuscitate: Secondary | ICD-10-CM | POA: Diagnosis not present

## 2024-06-16 DIAGNOSIS — I129 Hypertensive chronic kidney disease with stage 1 through stage 4 chronic kidney disease, or unspecified chronic kidney disease: Secondary | ICD-10-CM | POA: Diagnosis present

## 2024-06-16 LAB — ECHOCARDIOGRAM COMPLETE
AR max vel: 2.16 cm2
AV Area VTI: 2 cm2
AV Area mean vel: 2.12 cm2
AV Mean grad: 9 mmHg
AV Peak grad: 13.7 mmHg
Ao pk vel: 1.85 m/s
Area-P 1/2: 5.54 cm2
Height: 66 in
MV VTI: 1.62 cm2
S' Lateral: 2.5 cm
Weight: 2215.18 [oz_av]

## 2024-06-16 LAB — RESPIRATORY PANEL BY PCR

## 2024-06-16 LAB — BASIC METABOLIC PANEL WITH GFR
Anion gap: 13 (ref 5–15)
Anion gap: 17 — ABNORMAL HIGH (ref 5–15)
Anion gap: 20 — ABNORMAL HIGH (ref 5–15)
Anion gap: 10 (ref 5–15)
Anion gap: 16 — ABNORMAL HIGH (ref 5–15)
BUN: 61 mg/dL — ABNORMAL HIGH (ref 8–23)
BUN: 61 mg/dL — ABNORMAL HIGH (ref 8–23)
BUN: 58 mg/dL — ABNORMAL HIGH (ref 8–23)
BUN: 52 mg/dL — ABNORMAL HIGH (ref 8–23)
BUN: 49 mg/dL — ABNORMAL HIGH (ref 8–23)
CO2: 24 mmol/L (ref 22–32)
CO2: 24 mmol/L (ref 22–32)
CO2: 21 mmol/L — ABNORMAL LOW (ref 22–32)
CO2: 23 mmol/L (ref 22–32)
CO2: 22 mmol/L (ref 22–32)
Calcium: 8.6 mg/dL — ABNORMAL LOW (ref 8.9–10.3)
Calcium: 8.7 mg/dL — ABNORMAL LOW (ref 8.9–10.3)
Calcium: 8.8 mg/dL — ABNORMAL LOW (ref 8.9–10.3)
Calcium: 8.6 mg/dL — ABNORMAL LOW (ref 8.9–10.3)
Calcium: 8.9 mg/dL (ref 8.9–10.3)
Chloride: 111 mmol/L (ref 98–111)
Chloride: 109 mmol/L (ref 98–111)
Chloride: 109 mmol/L (ref 98–111)
Chloride: 114 mmol/L — ABNORMAL HIGH (ref 98–111)
Chloride: 111 mmol/L (ref 98–111)
Creatinine, Ser: 1.94 mg/dL — ABNORMAL HIGH (ref 0.44–1.00)
Creatinine, Ser: 1.78 mg/dL — ABNORMAL HIGH (ref 0.44–1.00)
Creatinine, Ser: 1.79 mg/dL — ABNORMAL HIGH (ref 0.44–1.00)
Creatinine, Ser: 1.51 mg/dL — ABNORMAL HIGH (ref 0.44–1.00)
Creatinine, Ser: 1.38 mg/dL — ABNORMAL HIGH (ref 0.44–1.00)
GFR, Estimated: 24 mL/min — ABNORMAL LOW (ref >60)
GFR, Estimated: 27 mL/min — ABNORMAL LOW (ref >60)
GFR, Estimated: 26 mL/min — ABNORMAL LOW (ref >60)
GFR, Estimated: 32 mL/min — ABNORMAL LOW (ref >60)
GFR, Estimated: 36 mL/min — ABNORMAL LOW (ref >60)
Glucose, Bld: 201 mg/dL — ABNORMAL HIGH (ref 70–99)
Glucose, Bld: 156 mg/dL — ABNORMAL HIGH (ref 70–99)
Glucose, Bld: 263 mg/dL — ABNORMAL HIGH (ref 70–99)
Glucose, Bld: 192 mg/dL — ABNORMAL HIGH (ref 70–99)
Glucose, Bld: 162 mg/dL — ABNORMAL HIGH (ref 70–99)
Potassium: 3.9 mmol/L (ref 3.5–5.1)
Potassium: 3.9 mmol/L (ref 3.5–5.1)
Potassium: 3.4 mmol/L — ABNORMAL LOW (ref 3.5–5.1)
Potassium: 2.9 mmol/L — ABNORMAL LOW (ref 3.5–5.1)
Potassium: 4.1 mmol/L (ref 3.5–5.1)
Sodium: 148 mmol/L — ABNORMAL HIGH (ref 135–145)
Sodium: 150 mmol/L — ABNORMAL HIGH (ref 135–145)
Sodium: 150 mmol/L — ABNORMAL HIGH (ref 135–145)
Sodium: 147 mmol/L — ABNORMAL HIGH (ref 135–145)
Sodium: 149 mmol/L — ABNORMAL HIGH (ref 135–145)

## 2024-06-16 LAB — GLUCOSE, CAPILLARY
Glucose-Capillary: 127 mg/dL — ABNORMAL HIGH (ref 70–99)
Glucose-Capillary: 137 mg/dL — ABNORMAL HIGH (ref 70–99)
Glucose-Capillary: 137 mg/dL — ABNORMAL HIGH (ref 70–99)
Glucose-Capillary: 144 mg/dL — ABNORMAL HIGH (ref 70–99)
Glucose-Capillary: 157 mg/dL — ABNORMAL HIGH (ref 70–99)
Glucose-Capillary: 164 mg/dL — ABNORMAL HIGH (ref 70–99)
Glucose-Capillary: 171 mg/dL — ABNORMAL HIGH (ref 70–99)
Glucose-Capillary: 182 mg/dL — ABNORMAL HIGH (ref 70–99)
Glucose-Capillary: 185 mg/dL — ABNORMAL HIGH (ref 70–99)
Glucose-Capillary: 192 mg/dL — ABNORMAL HIGH (ref 70–99)
Glucose-Capillary: 192 mg/dL — ABNORMAL HIGH (ref 70–99)
Glucose-Capillary: 195 mg/dL — ABNORMAL HIGH (ref 70–99)
Glucose-Capillary: 199 mg/dL — ABNORMAL HIGH (ref 70–99)
Glucose-Capillary: 201 mg/dL — ABNORMAL HIGH (ref 70–99)
Glucose-Capillary: 201 mg/dL — ABNORMAL HIGH (ref 70–99)
Glucose-Capillary: 202 mg/dL — ABNORMAL HIGH (ref 70–99)
Glucose-Capillary: 214 mg/dL — ABNORMAL HIGH (ref 70–99)
Glucose-Capillary: 233 mg/dL — ABNORMAL HIGH (ref 70–99)
Glucose-Capillary: 248 mg/dL — ABNORMAL HIGH (ref 70–99)
Glucose-Capillary: 255 mg/dL — ABNORMAL HIGH (ref 70–99)

## 2024-06-16 LAB — URINALYSIS, ROUTINE W REFLEX MICROSCOPIC
Bilirubin Urine: NEGATIVE
Glucose, UA: 150 mg/dL — AB
Hgb urine dipstick: NEGATIVE
Ketones, ur: 5 mg/dL — AB
Nitrite: NEGATIVE
Protein, ur: 30 mg/dL — AB
Specific Gravity, Urine: 1.021 (ref 1.005–1.030)
pH: 5 (ref 5.0–8.0)

## 2024-06-16 LAB — BLOOD GAS, VENOUS
Acid-Base Excess: 1.7 mmol/L (ref 0.0–2.0)
Bicarbonate: 27.2 mmol/L (ref 20.0–28.0)
O2 Saturation: 95.5 %
Patient temperature: 36.8
pCO2, Ven: 45 mmHg (ref 44–60)
pH, Ven: 7.39 (ref 7.25–7.43)
pO2, Ven: 65 mmHg — ABNORMAL HIGH (ref 32–45)

## 2024-06-16 LAB — BETA-HYDROXYBUTYRIC ACID
Beta-Hydroxybutyric Acid: 0.13 mmol/L (ref 0.05–0.27)
Beta-Hydroxybutyric Acid: 0.17 mmol/L (ref 0.05–0.27)

## 2024-06-16 LAB — LIPID PANEL
Cholesterol: 147 mg/dL (ref 0–200)
HDL: 40 mg/dL — ABNORMAL LOW (ref >40)
LDL Cholesterol: 86 mg/dL (ref 0–99)
Total CHOL/HDL Ratio: 3.7 ratio
Triglycerides: 107 mg/dL (ref <150)
VLDL: 21 mg/dL (ref 0–40)

## 2024-06-16 LAB — LACTIC ACID, PLASMA
Lactic Acid, Venous: 2.5 mmol/L (ref 0.5–1.9)
Lactic Acid, Venous: 2.3 mmol/L (ref 0.5–1.9)
Lactic Acid, Venous: 1.7 mmol/L (ref 0.5–1.9)
Lactic Acid, Venous: 3 mmol/L (ref 0.5–1.9)

## 2024-06-16 LAB — PROCALCITONIN: Procalcitonin: 0.21 ng/mL

## 2024-06-16 LAB — MRSA NEXT GEN BY PCR, NASAL: MRSA by PCR Next Gen: NOT DETECTED

## 2024-06-16 LAB — TROPONIN I (HIGH SENSITIVITY)
Troponin I (High Sensitivity): 192 ng/L (ref <18)
Troponin I (High Sensitivity): 179 ng/L (ref <18)

## 2024-06-16 MED ORDER — SODIUM CHLORIDE 0.45 % IV SOLN: INTRAVENOUS | Status: DC

## 2024-06-16 MED ORDER — DEXTROSE-SODIUM CHLORIDE 5-0.45 % IV SOLN: INTRAVENOUS | Status: AC

## 2024-06-16 MED ORDER — VANCOMYCIN HCL IN DEXTROSE 1-5 GM/200ML-% IV SOLN: 1000.0000 mg | INTRAVENOUS | Status: DC

## 2024-06-16 MED ORDER — LABETALOL HCL 5 MG/ML IV SOLN
5.0000 mg | INTRAVENOUS | Status: DC | PRN
Start: 2024-06-16 — End: 2024-06-29
  Administered 2024-06-17 (×4): 5 mg via INTRAVENOUS
  Filled 2024-06-16 (×3): qty 4

## 2024-06-16 MED ORDER — HEPARIN (PORCINE) 25000 UT/250ML-% IV SOLN
700.0000 [IU]/h | INTRAVENOUS | Status: DC
  Administered 2024-06-16: 700 [IU]/h via INTRAVENOUS
  Filled 2024-06-16: qty 250

## 2024-06-16 MED ORDER — HEPARIN SODIUM (PORCINE) 5000 UNIT/ML IJ SOLN
5000.0000 [IU] | Freq: Three times a day (TID) | INTRAMUSCULAR | Status: DC
  Administered 2024-06-16 – 2024-06-29 (×39): 5000 [IU] via SUBCUTANEOUS
  Filled 2024-06-16 (×39): qty 1

## 2024-06-16 MED ORDER — SODIUM CHLORIDE 0.9 % IV SOLN
1.0000 g | Freq: Two times a day (BID) | INTRAVENOUS | Status: AC
  Administered 2024-06-16 – 2024-06-22 (×14): 1 g via INTRAVENOUS
  Filled 2024-06-16 (×14): qty 20

## 2024-06-16 MED ORDER — ASPIRIN 300 MG RE SUPP
300.0000 mg | Freq: Once | RECTAL | Status: AC
  Administered 2024-06-16: 300 mg via RECTAL
  Filled 2024-06-16: qty 1

## 2024-06-16 MED ORDER — ACETAMINOPHEN 325 MG PO TABS
650.0000 mg | ORAL_TABLET | Freq: Four times a day (QID) | ORAL | Status: DC | PRN
  Administered 2024-06-18 – 2024-06-29 (×8): 650 mg via ORAL
  Filled 2024-06-16 (×8): qty 2

## 2024-06-16 MED ORDER — ALBUMIN HUMAN 25 % IV SOLN
25.0000 g | INTRAVENOUS | Status: AC
  Administered 2024-06-16: 25 g via INTRAVENOUS
  Filled 2024-06-16: qty 100

## 2024-06-16 MED ORDER — DEXTROSE-SODIUM CHLORIDE 5-0.45 % IV SOLN: INTRAVENOUS | Status: DC

## 2024-06-16 MED ORDER — POTASSIUM CHLORIDE 10 MEQ/100ML IV SOLN
10.0000 meq | INTRAVENOUS | Status: AC
  Administered 2024-06-16 (×4): 10 meq via INTRAVENOUS
  Filled 2024-06-16 (×4): qty 100

## 2024-06-16 NOTE — Plan of Care (Signed)
  Problem: Coping: Goal: Ability to adjust to condition or change in health will improve Outcome: Not Progressing   Problem: Metabolic: Goal: Ability to maintain appropriate glucose levels will improve Outcome: Progressing   Problem: Skin Integrity: Goal: Risk for impaired skin integrity will decrease Outcome: Progressing

## 2024-06-16 NOTE — TOC Initial Note (Signed)
 Transition of Care [facility]) - Initial/Assessment Note    Patient Details  Name: Kristen Bowman MRN: [id number] Date of Birth: 1930-11-06  [facility]) CM/SW Contact:    Isaiah Public, LCSWA Phone Number: 06/16/2024, 4:18 PM  Clinical Narrative:                  PTA patient comes from [facility] just for respite with [facility] hospice. TOC will continue to follow.       Patient Goals and CMS Choice            Expected Discharge Plan and Services                                              Prior Living Arrangements/Services                       Activities of Daily Living      Permission Sought/Granted                  Emotional Assessment              Admission diagnosis:  Diabetic ketoacidosis without coma associated with type 1 diabetes mellitus (HCC) [E10.10] AMS (altered mental status) [R41.82] Patient Active Problem List   Diagnosis Date Noted   Elevated troponin 06/16/2024   Pressure injury of skin 06/16/2024   AMS (altered mental status) 06/15/2024   UTI (urinary tract infection) 03/27/2024   Acute encephalopathy 03/26/2024   Diabetes mellitus type 2 in nonobese (HCC) 03/26/2024   Dementia without behavioral disturbance (HCC) 10/17/2019   Pulmonary hypertension (HCC) 06/09/2014   Tachycardia 09/24/2012   Palpitations 09/24/2012   CKD (chronic kidney disease), stage II 09/24/2012   Anemia, iron  deficiency 09/24/2012   Hypertension    Panic disorder    Hyperlipidemia    Type II or unspecified type diabetes mellitus with renal manifestations, uncontrolled(250.42) 05/29/2010   PCP:  Valentin Skates, DO Pharmacy:   [phone] [place] [place] - [address] AT [facility] [address] [place] [address] Phone: [phone] Fax: [phone]     Social Drivers of Health (SDOH) Social History: SDOH Screenings   Food Insecurity: No Food Insecurity  (03/27/2024)  Housing: Low Risk  (03/27/2024)  Transportation Needs: No Transportation Needs (03/27/2024)  Utilities: Not At Risk (03/27/2024)  Social Connections: Socially Isolated (03/27/2024)  Tobacco Use: Low Risk  (03/27/2024)   SDOH Interventions:     Readmission Risk Interventions     No data to display

## 2024-06-16 NOTE — Progress Notes (Signed)
 Pharmacy Antibiotic Note  Kristen Bowman is a 89 y.o. female admitted on 06/15/2024 with sepsis.  Pharmacy has been consulted for vancomycin  dosing. Patient presented from facility for hyperglycemia and lethargy. Patient now with suspected sepsis and hx ESBL infection. AKI on admit has improved.  Plan: -Vancomycin  1000mg  IV q48h starting tomorrow - est AUC 540 -Meropenem 1g IV q12h  Height: 5' 6 (167.6 cm) Weight: 62.8 kg (138 lb 7.2 oz) IBW/kg (Calculated) : 59.3  Temp (24hrs), Avg:98.9 F (37.2 C), Min:98 F (36.7 C), Max:99.9 F (37.7 C)  Recent Labs  Lab 06/15/24 1440 06/15/24 1754 06/15/24 1801 06/16/24 0225 06/16/24 0437 06/16/24 0643  WBC 12.6*  --   --   --   --   --   CREATININE 2.74* 2.54*  --  1.94*  --  1.78*  LATICACIDVEN  --   --  3.4* 2.5* 2.3*  --     Estimated Creatinine Clearance: 19.3 mL/min (A) (by C-G formula based on SCr of 1.78 mg/dL (H)).    Allergies  Allergen Reactions   Ace Inhibitors Shortness Of Breath and Swelling   Tape Other (See Comments)    Skin bruises and tears easily   Antimicrobials this admission: Vancomycin  10/21>> Ceftriaxone  1021>>  Dose adjustments this admission: None  Microbiology results: 10/21 MRSA culture pending 10/21 Resp pathogen panel pending 10/21 BCx pending  Thank you for allowing pharmacy to be a part of this patient's care.   Ozell Jamaica, PharmD, BCPS, [facility] Clinical Pharmacist [phone] Please check AMION for all [facility] Pharmacy numbers 06/16/2024

## 2024-06-16 NOTE — Progress Notes (Signed)
 NAME:  Kristen Bowman, MRN:  [id number], DOB:  1931/01/20, LOS: 1 ADMISSION DATE:  06/15/2024, CONSULTATION DATE:  06/15/24 REFERRING MD:  Dr. Tobie, CHIEF COMPLAINT:  DKA/ encephalopathy   History of Present Illness:  Pt encephalopathic, therefore HPI obtained from EMR.    38 yoF with PMH of dementia, seizures, DM, CKD II, HTN, IDA, HLD, RBBB, chronic diarrhea, panic disorder, hyperammonemia, poor mobility, an d UTI (most recently treated 05/21/24), presented from SNF with progressively altered mental status over last day and hyperglycemia.  Was at nursing facility for 5 day respite as family primary caregivers and some concern facility was not aware of existing DM.  At baseline is alert, talking, and oriented to self.   On arrival to ER, pt afebrile, NSR 70, normotensive, and sats 88% RA initially with EMS and tachypneic in 20's.  Labs noted for corrected Na 158, glucose 773, Co2 16, AG 26, BHA > 8, BUN/ sCr 64/ 2.74, WBC 12.6, blood gas 7.266/ 35.8/ 62/ 16.3.  UA not collected. CXR noted for vascular congestion. Treated with 3L fluid and insulin  gtt started to be admitted to [facility] but given worsening encephalopathy and concern for airway protection with repeat CMET pending and lactic after fluids of 3.6, PCCM consulted.    Pertinent  Medical History  Dementia, seizures, DM, CKD II, HTN, IDA, HLD, RBBB, chronic diarrhea, panic disorder, hyperammonemia, poor mobility    Significant Hospital Events: Including procedures, antibiotic start and stop dates in addition to other pertinent events   06/15/2024 admit to [facility] w DKA PCCM consult for AMS as DNR status was rescinded. Did not require ICU 10/22 code status changed to DNR/I.  Aspirating. PCCM consulted   Interim History / Subjective:  Code status restored to DNR/I Incr hypoxia Aspiration event   Gap closed, has an elevated LA now. Cards was consulted for elevated BNP   Febrile 101 this morning    PCCM called back to eval for bipap  candidacy   Objective    Blood pressure (!) 169/70, pulse (!) 127, temperature (!) 100.6 F (38.1 C), temperature source Axillary, resp. rate (!) 32, height 5' 6 (1.676 m), weight 62.8 kg, SpO2 97%.        Intake/Output Summary (Last 24 hours) at 06/16/2024 1610 Last data filed at 06/16/2024 1136 Gross per 24 hour  Intake 1806.21 ml  Output 300 ml  Net 1506.21 ml   Filed Weights   06/15/24 1552 06/15/24 1953  Weight: 74.8 kg 62.8 kg    Examination: General: chronically and acutely ill elderly F NAD  HENT: NCAT NRB in place  Lungs: symmetrical chest expansion. Rhonchi, incr RR  Cardiovascular: tachycardic cap refill brisk  Abdomen: soft   Extremities: no pitting edema  Neuro: awakens to voice oriented to self  GU: defer   Resolved problem list   Assessment and Plan   DNR  DKA DM2 w hyperglycemia  Acute encephalopathy superimposed on baseline dementia  Sepsis 2/2 UTI, aspiration PNA  Acute hypoxic resp failure Aspiration event  UTI Elevated LA  AKI on CKD2 Hypernatremia  Hypokalemia  Hx seizures  Hx HTN Hx HLD  Hx ESBL UTI  P: -DNR/I  -would not use bipap. Very well documented aspiration w RN note. Mentation would preclude this being a safe option. Cont NRB, could consider HHFNC  -no indication for ICU txf  -NPO, aspiration precautions, adding PRN NTS  -on mero, vanc w hx of esbl UTI. Pending MRSA PCR could dc vanc. If Ucx does  not reveal esbl, would rec changing abx to unasyn -rec AM CBC, follow fever curve -K being replaced by primary -cont following BMP -currently on endotool protocol. DM coordinator following -hold home antihypertensives  -reasonable to consult palliative care -- hopefully patient can turn a corner, but think having them involved would be helpful for family if she does not improve or further declines     PCCM will sign off please let us  know if we can be of further assistance    Labs   CBC: Recent Labs  Lab 06/15/24 1440  06/15/24 1450  WBC 12.6*  --   NEUTROABS 10.4*  --   HGB 11.7* 11.9*  HCT 39.7 35.0*  MCV 94.7  --   PLT 229  --     Basic Metabolic Panel: Recent Labs  Lab 06/15/24 1754 06/16/24 0225 06/16/24 0643 06/16/24 1040 06/16/24 1351  NA 146* 148* 150* 150* 147*  K 3.9 3.9 3.9 3.4* 2.9*  CL 109 111 109 109 114*  CO2 14* 24 24 21* 23  GLUCOSE 617* 201* 156* 263* 192*  BUN 60* 61* 61* 58* 52*  CREATININE 2.54* 1.94* 1.78* 1.79* 1.51*  CALCIUM  8.3* 8.6* 8.7* 8.8* 8.6*  MG 1.9  --   --   --   --    GFR: Estimated Creatinine Clearance: 22.7 mL/min (A) (by C-G formula based on SCr of 1.51 mg/dL (H)). Recent Labs  Lab 06/15/24 1440 06/15/24 1754 06/15/24 1801 06/16/24 0225 06/16/24 0437 06/16/24 1039 06/16/24 1040 06/16/24 1351  PROCALCITON  --  <0.10  --   --   --  0.21  --   --   WBC 12.6*  --   --   --   --   --   --   --   LATICACIDVEN  --   --    < > 2.5* 2.3*  --  1.7 3.0*   < > = values in this interval not displayed.    Liver Function Tests: Recent Labs  Lab 06/15/24 1754  AST 14*  14*  ALT 11  11  ALKPHOS 65  65  BILITOT 2.2*  2.3*  PROT 5.7*  5.7*  ALBUMIN  2.5*  2.5*   No results for input(s): LIPASE, AMYLASE in the last 168 hours. No results for input(s): AMMONIA in the last 168 hours.  ABG    Component Value Date/Time   HCO3 27.2 06/16/2024 0222   TCO2 17 (L) 06/15/2024 1450   ACIDBASEDEF 10.0 (H) 06/15/2024 1450   O2SAT 95.5 06/16/2024 0222     Coagulation Profile: No results for input(s): INR, PROTIME in the last 168 hours.  Cardiac Enzymes: No results for input(s): CKTOTAL, CKMB, CKMBINDEX, TROPONINI in the last 168 hours.  HbA1C: Hgb A1c MFr Bld  Date/Time Value Ref Range Status  09/25/2012 04:35 AM 7.0 (H) <5.7 % Final    Comment:    (NOTE)                                                                       According to the ADA Clinical Practice Recommendations for 2011, when HbA1c is used as a screening  test:  >=6.5%   Diagnostic of Diabetes Mellitus           (  if abnormal result is confirmed) 5.7-6.4%   Increased risk of developing Diabetes Mellitus References:Diagnosis and Classification of Diabetes Mellitus,Diabetes Care,2011,34(Suppl 1):S62-S69 and Standards of Medical Care in         Diabetes - 2011,Diabetes Care,2011,34 (Suppl 1):S11-S61.    CBG: Recent Labs  Lab 06/16/24 1056 06/16/24 1156 06/16/24 1254 06/16/24 1357 06/16/24 1530  GLUCAP 248* 214* 202* 185* 182*    CCT n/a   Ronnald Gave MSN, AGACNP-BC [facility] Pulmonary/Critical Care Medicine Amion for pager  06/16/2024, 4:10 PM

## 2024-06-16 NOTE — Progress Notes (Signed)
 Dentures returned to patients daughter Kristen Bowman). Kristen Bowman agrees to take upper and lower dentures home.

## 2024-06-16 NOTE — Consult Note (Addendum)
 Cardiology Consultation   Patient ID: Kristen Bowman MRN: [id number]; DOB: Nov 15, 1930  Admit date: 06/15/2024 Date of Consult: 06/16/2024  PCP:  Valentin Skates, DO   [facility] Providers Cardiologist:  Vina Gull, MD       Patient Profile: Kristen Bowman is a 89 y.o. female with a hx of dementia, type 2 diabetes, CKD stage IIIa, hyperlipidemia, right bundle branch block, anemia, hypertension, pulmonary hypertension, coronary calcifications, and polymyalgia rheumatica who is being seen 06/16/2024 for the evaluation of elevated troponins at the request of Anmar Al-Sultani.  History of Present Illness: Kristen Bowman is a 89 year old female who per chart review has not previously been seen by cardiology.  Her most recent echocardiogram at [facility] was done back in 2015 and showed a normal LVEF of 60%, moderate LVH, normal RV systolic function, and no RWMA.  A chest CT at that time showed coronary calcifications in the LAD, LCx and RCA  Patient presented from a SNF to the emergency department on 06/15/2024 for hyperglycemia and altered mental status. UA was positive for UTI and patient was started on IV antibiotics.   Interview was limited by patients altered mental status.  Patient opens eyes when aroused but unable to answer questions.  Contacted the patient's daughter Nadia) and she reported that the patient was at a SNF for several days prior to being hospitalized.  She suspects that the patient was not receiving her regular insulin  at the facility and this is why her glucose was so elevated on arrival.  She reported that her mother could have a conversation and was alert and orientated when she went to the facility.  She could also walk around the house but was unable to go for longer walks outside or do more than 4 metabolic equivalents of exertion.  Denies that she has heard any reports of her mother having any chest pain or shortness of breath.  Is trying to get documents from the SNF  that her mother was at.  Labs showed elevated high-sensitivity troponins 100 > 129 > 192 > 179, BNP was elevated at 444.9, LDL of 86, also had an AKI that is improving creatinine today was 1.78 down from 2.74 at arrival, hyperglycemia of 156 down from 773 at arrival, elevated lactic acid of 2.3 that is down from 3.4 on arrival.  Elevated BUN of 61, hypernatremia of 150, and hypocalcemia of 8.7  Chest x-ray showed aortic atherosclerosis, central pulmonary vascular congestion without pulmonary edema and borderline cardiomegaly.  EKG showed normal sinus rhythm with a rate of 89, right bundle branch block, T wave inversions in lead III, aVF, and V2 this was seen on prior EKG on 8/25.  Past Medical History:  Diagnosis Date   Anemia    Anxiety states    Diabetes mellitus    T2DM with renal manifestations   Generalized osteoarthrosis, unspecified site    Hematuria    Hyperlipidemia    mixed   Hypertension    Irritable bowel syndrome    Other specified cardiac dysrhythmias(427.89)    atrial fibrilation   Panic disorder    Polymyalgia rheumatica    Proteinuria 05/29/10   Type II or unspecified type diabetes mellitus with renal manifestations, uncontrolled(250.42) 05/29/10   CKD II (mild)    Past Surgical History:  Procedure Laterality Date   CATARACT EXTRACTION     right   COLONOSCOPY N/A 11/04/2012   Procedure: COLONOSCOPY;  Surgeon: Elsie Cree, MD;  Location: [facility];  Service: Endoscopy;  Laterality: N/A;   ESOPHAGOGASTRODUODENOSCOPY N/A 11/04/2012   Procedure: ESOPHAGOGASTRODUODENOSCOPY (EGD);  Surgeon: Elsie Cree, MD;  Location: [facility];  Service: Endoscopy;  Laterality: N/A;   TUBAL LIGATION       Home Medications:  Prior to Admission medications   Medication Sig Start Date End Date Taking? Authorizing Provider  amLODipine  (NORVASC ) 10 MG tablet Take 1 tablet (10 mg total) by mouth daily. 04/01/24 05/01/24  Christobal Guadalajara, MD  atorvastatin  (LIPITOR) 40 MG tablet Take 40  mg by mouth daily.    [provider]  clonazePAM  (KLONOPIN ) 0.5 MG tablet Take 1 tablet (0.5 mg total) by mouth daily for 3 doses. 03/31/24 04/03/24  Christobal Guadalajara, MD  [facility] GENUINE ASPIRIN  325 MG tablet Take 325 mg by mouth daily. 01/02/21   [provider]  [facility] VITAMIN B12 1000 MCG tablet Take 1,000 mcg by mouth daily.    [provider]  divalproex  (DEPAKOTE  SPRINKLES) 125 MG capsule Take 2 capsules (250 mg total) by mouth 2 (two) times daily. 02/15/24   Wertman, Sara E, PA-C  donepezil  (ARICEPT ) 5 MG tablet Take 1 tablet (5 mg total) by mouth at bedtime. Patient taking differently: Take 5 mg by mouth every evening. 03/05/24   Wertman, Sara E, PA-C  insulin  lispro (HUMALOG  KWIKPEN) 100 UNIT/ML KwikPen Use as directed sliding scale: Blood sugar 0 - 200 give 0 units, Blood sugar 200-250 give 2 units, blood sugar 250-300 give 4 units, blood sugar 300-350 give 6 units, blood sugar 350-400 give 8 units, blood sugar over 400 give 10 units. Patient taking differently: Inject 3-8 Units into the skin 3 (three) times daily. Per sliding scale 12/25/22     lactulose  (CHRONULAC ) 10 GM/15ML solution Take 30 mLs (20 g total) by mouth 2 (two) times daily. 03/31/24   Christobal Guadalajara, MD  LANTUS  SOLOSTAR 100 UNIT/ML Solostar Pen Inject 20 Units into the skin at bedtime. 03/31/24   Christobal Guadalajara, MD  losartan  (COZAAR ) 50 MG tablet Take 50 mg by mouth daily.    [provider]  melatonin 3 MG TABS tablet Take 3 mg by mouth at bedtime.    [provider]  metFORMIN  (GLUCOPHAGE ) 500 MG tablet Take 750 mg by mouth daily with breakfast.    [provider]  Metoprolol  Tartrate 75 MG TABS Take 1 tablet (75 mg total) by mouth 2 (two) times daily. 03/31/24   Christobal Guadalajara, MD  risperiDONE  (RISPERDAL ) 0.5 MG tablet Take 0.5 mg by mouth daily.    [provider]  sertraline  (ZOLOFT ) 50 MG tablet Take 50 mg by mouth daily.    [provider]  traZODone  (DESYREL ) 50 MG tablet Take 50  mg by mouth at bedtime.    [provider]    Scheduled Meds:  Continuous Infusions:  sodium chloride      dextrose  5 % and 0.45 % NaCl     heparin  700 Units/hr (06/16/24 0600)   insulin  7.5 Units/hr (06/16/24 0957)   meropenem (MERREM) IV Stopped (06/16/24 0456)   [START ON 06/17/2024] vancomycin      PRN Meds: acetaminophen , dextrose , hydrALAZINE   Allergies:    Allergies  Allergen Reactions   Ace Inhibitors Shortness Of Breath and Swelling   Tape Other (See Comments)    Skin bruises and tears easily    Social History:   Social History   Socioeconomic History   Marital status: Divorced    Spouse name: Not on file   Number of children: Not on file   Years  of education: Not on file   Highest education level: Not on file  Occupational History   Not on file  Tobacco Use   Smoking status: Never   Smokeless tobacco: Never  Vaping Use   Vaping status: Never Used  Substance and Sexual Activity   Alcohol use: No   Drug use: No   Sexual activity: Not on file  Other Topics Concern   Not on file  Social History Narrative   Right handed    Lives at [facility]   With daughter today, turns 32 this year   Social Drivers of Corporate investment banker Strain: Not on file  Food Insecurity: No Food Insecurity (03/27/2024)   Hunger Vital Sign    Worried About Running Out of Food in the Last Year: Never true    Ran Out of Food in the Last Year: Never true  Transportation Needs: No Transportation Needs (03/27/2024)   PRAPARE - Administrator, Civil Service (Medical): No    Lack of Transportation (Non-Medical): No  Physical Activity: Not on file  Stress: Not on file  Social Connections: Socially Isolated (03/27/2024)   Social Connection and Isolation Panel    Frequency of Communication with Friends and Family: Three times a week    Frequency of Social Gatherings with Friends and Family: Three times a week    Attends Religious Services: Never     Active Member of Clubs or Organizations: No    Attends Banker Meetings: Never    Marital Status: Divorced  Catering manager Violence: Not At Risk (03/27/2024)   Humiliation, Afraid, Rape, and Kick questionnaire    Fear of Current or Ex-Partner: No    Emotionally Abused: No    Physically Abused: No    Sexually Abused: No    Family History:    Family History  Problem Relation Age of Onset   Diabetes Sister    Cancer Other    Heart attack Other    Emphysema Mother    Leukemia Father      ROS:  Please see the history of present illness.   All other ROS reviewed and negative.     Physical Exam/Data: Vitals:   06/16/24 0806 06/16/24 0951 06/16/24 0955 06/16/24 1001  BP:  (!) 150/59    Pulse: 88 (!) 113 (!) 120 (!) 108  Resp: (!) 23 18 (!) 26 (!) 24  Temp:   (!) 101 F (38.3 C)   TempSrc:   Axillary   SpO2: 91% 91% 94% 94%  Weight:      Height:        Intake/Output Summary (Last 24 hours) at 06/16/2024 1045 Last data filed at 06/16/2024 0600 Gross per 24 hour  Intake 3326.21 ml  Output --  Net 3326.21 ml      06/15/2024    7:53 PM 06/15/2024    3:52 PM 03/27/2024    1:40 AM  Last 3 Weights  Weight (lbs) 138 lb 7.2 oz 165 lb 167 lb 12.3 oz  Weight (kg) 62.8 kg 74.844 kg 76.1 kg     Body mass index is 22.35 kg/m.  General:  Well nourished, well developed, in no acute distress.  Opens eyes when aroused but unable to answer questions.  On 15 L nonrebreather mask HEENT: normal Neck: Able to assess JVD as patient cannot follow directions. Vascular: No carotid bruits; Distal pulses 2+ bilaterally Cardiac:  normal S1, S2; RRR; murmur present at left lower sternal border.  Lungs: Wheezing heard throughout the lungs. Abd: soft, nontender, no hepatomegaly  Ext: no edema Musculoskeletal:  No deformities Skin: warm and dry  Neuro:   no focal abnormalities noted Psych:  Normal affect   EKG:  The EKG was personally reviewed and demonstrates:  normal sinus  rhythm with a rate of 89, right bundle branch block, T wave inversions in lead III, aVF, and V2 this was seen on prior EKG on 8/25. Telemetry:  Telemetry was personally reviewed and demonstrates: Normal sinus rhythm with heart rates in the 80s to 120s  Relevant CV Studies: Echo pending  Laboratory Data: High Sensitivity Troponin:   Recent Labs  Lab 06/15/24 1754 06/15/24 2045 06/16/24 0225 06/16/24 0437  TROPONINIHS 100* 129* 192* 179*     Chemistry Recent Labs  Lab 06/15/24 1754 06/16/24 0225 06/16/24 0643  NA 146* 148* 150*  K 3.9 3.9 3.9  CL 109 111 109  CO2 14* 24 24  GLUCOSE 617* 201* 156*  BUN 60* 61* 61*  CREATININE 2.54* 1.94* 1.78*  CALCIUM  8.3* 8.6* 8.7*  MG 1.9  --   --   GFRNONAA 17* 24* 27*  ANIONGAP 23* 13 17*    Recent Labs  Lab 06/15/24 1754  PROT 5.7*  5.7*  ALBUMIN  2.5*  2.5*  AST 14*  14*  ALT 11  11  ALKPHOS 65  65  BILITOT 2.2*  2.3*   Lipids  Recent Labs  Lab 06/16/24 0437  CHOL 147  TRIG 107  HDL 40*  LDLCALC 86  CHOLHDL 3.7    Hematology Recent Labs  Lab 06/15/24 1440 06/15/24 1450  WBC 12.6*  --   RBC 4.19  --   HGB 11.7* 11.9*  HCT 39.7 35.0*  MCV 94.7  --   MCH 27.9  --   MCHC 29.5*  --   RDW 16.1*  --   PLT 229  --    Thyroid  No results for input(s): TSH, FREET4 in the last 168 hours.  BNP Recent Labs  Lab 06/15/24 1754  BNP 444.9*    DDimer No results for input(s): DDIMER in the last 168 hours.  Radiology/Studies:  DG Chest Portable 1 View Result Date: 06/15/2024 EXAM: 1 VIEW(S) XRAY OF THE CHEST 06/15/2024 02:33:00 PM COMPARISON: 03/29/2024 CLINICAL HISTORY: ALOC. Triage notes: Pt bib [facility] coming from [facility]. EMS called out for hyperglycemia and lethargy. Pt has been at facility since Friday. Per EMS, facility was unaware that patient is diabetic and pt has not had her blood sugar checked or received ; insulin . Pt lethargic during triage. Patient normally alert and talking at baseline,  unsure if patient is normally oriented. Patient SpO2 88% with EMS on RA. High glucose read with EMS (above 600).; ; EMS VS:; 120/40; 80 HR; 90% 2L; 20G LAC; 500 CC NS FINDINGS: LUNGS AND PLEURA: Improved lung volumes. Central pulmonary vascular congestion without overt pulmonary edema. No focal pulmonary opacity. No pleural effusion. No pneumothorax. HEART AND MEDIASTINUM: Borderline cardiomegaly. Calcified aortic atherosclerosis. BONES AND SOFT TISSUES: No acute osseous abnormality. IMPRESSION: 1. Central pulmonary vascular congestion without overt pulmonary edema. 2. Similar borderline cardiomegaly. Electronically signed by: Donnice Mania MD 06/15/2024 03:10 PM EDT RP Workstation: [id number]     Assessment and Plan: Kristen Bowman is a 89 y.o. female with a hx of dementia, type 2 diabetes, CKD stage IIIa, hyperlipidemia, right bundle branch block, anemia, hypertension, pulmonary hypertension, coronary calcifications, and polymyalgia rheumatica who is being seen 06/16/2024 for the evaluation of elevated  troponins at the request of Anmar Al-Sultani.  DKA Presented from a SNF.  The patient was reportedly not receiving insulin  that she was prescribed.  Glucose was elevated at 773.  She was started on an insulin  drip.  Glucose has improved to 156. Management per primary   Altered mental status sepsis Acute respiratory failure UTI Presented to the emergency department with an altered mental status.  Is typically alert and orientated at baseline.  Altered mental status may be secondary to DKA or sepsis.  Opens eyes when aroused but unable to answer questions.  At about 10 AM this morning developed a fever of 101 UA indicated possible UTI.  Was started on IV antibiotics.  Is currently on 15 L and a nonrebreather mask to keep SpO2 elevated. Repeat chest x-ray pending. Management per primary   BNP elevated at 444.9 In the setting of altered mental status, DKA, sepsis, and UTI as mentioned above. -  Chest x-ray showed  central pulmonary vascular congestion without pulmonary edema and borderline cardiomegaly. Received IV lactated Ringer 's when was treated for DKA.  Does not appear to be significantly volume up on exam. Echo pending   Elevated high-sensitivity troponins 100 > 129 > 192 > 179 Coronary calcifications Coronary calcifications in LAD, LCx, and RCA were seen back in 2015. Difficulty assessing patient's symptoms given that she has altered mental status and family is unaware of what symptoms she previously had because she was temporarily at a SNF.  Family is unaware of her having any chest pain or shortness of breath. No acute ischemic changes were seen on EKG. Suspect that elevated troponins are secondary to demand ischemia.  If echo is reassuring no further ischemic evaluation is warranted at this time Echo pending.   CKD stage IIIa AKI Received IV lactated ringer 's and dextrose  in lactated Ringer 's when was treated for DKA.  AKI is improving creatinine today was 1.78 down from 2.74 at arrival.    Risk Assessment/Risk Scores:        [facility] will sign off.   The patient is ready for discharge today from a cardiac standpoint if echo is reassuring. Medication Recommendations: None Other recommendations (labs, testing, etc): Echo pending Follow up as an outpatient: Follow-up as needed  For questions or updates, please contact [facility] Please consult [email or web address] for contact info under     Signed, Morse Clause, PA-C  06/16/2024 10:45 AM   Pt seen and examined   I agree with findings as noted above by JENEANE Clause. Pt is a 89 yo wth hx of HTN, coronary calcifications T2DM, HL, CKD, dementia.   In hospice care at home      Was in respite care at [facility]  Admitted 10/21 with hypoerglycemia and altered mental status  Found to have UTI.  Glu 773 on arrival   Lactic acid 3.4   Cr 2.74  Na 150  Asked to see re elevated troponins (100, 129, 192,  179)  Currently pt not too communicative   Opens eyes  On 15 L NRB mask  Neck   Unable to assess JVP Lungs    Wheezes  Rhonchi Cardiac RRR  Tachy Abd   Supple  Nontender Ext are without edema   Feet warm 2 + pulses   I have reviewed echo images    LV systolic function is vigorous with near cavity oblitration during systole   RVEF also normal NO wall motion abnormalities   Troponin elevation  most likely due to  profound metabolic problems with resultant subendocardial ischemia/straiin Pt not communicative  Would treat abnormalities  (electrolyte, glucose, infection) Hydrate   Will be available as needed   Please call with questions   Will sign off  Vina Gull MD

## 2024-06-16 NOTE — Progress Notes (Signed)
 Respiratory Panel PCR collected and sent to lab.

## 2024-06-16 NOTE — Progress Notes (Signed)
   06/16/24 0200  Provider Notification  Provider Name/Title Dr. Shona  Date Provider Notified 06/16/24  Time Provider Notified 0200  Method of Notification Rounds  Notification Reason Other (Comment) (Patient remains somnolent despite blood sugar dropping. Thick sputum, yellow. Patient is ronchous and starting to desat on Nasal Canula. Switched over to non-rebreather)  Provider response At bedside;See new orders (Verified code status with patients daughter. This RN witnessed, code status verified at DNR. Labs ordered and cardiology consulted.)  Date of Provider Response 06/16/24  Time of Provider Response 0200   Rapid RN notified as well, phlebotomy at bedside getting new orders

## 2024-06-16 NOTE — Progress Notes (Addendum)
 Overnight cross coverage, TRH, hospitalist service.  Was notified by bedside RN regarding the patient being lethargic.  Reviewed her chart and trended her troponin.  Presented at bedside.  The patient is hypersomnolent and arouses to mild sternal rub.  Frail-appearing with rhonchorous sounds on auscultation.  Tachycardic.  No lower extremity edema.  The patient is unable to provide a history or answer any meaningful questions due to lethargy.  Blood glucose is improving on the insulin  drip.  Ordered venous blood gas, pH 7.39, pCO2 45.  Lactic acid is downtrending 2.5 from 3.4.    Repeat troponin 192 from 129 with a delta.  No ST elevation on twelve-lead EKG, serial EKG ordered.  Full dose rectal aspirin  300 mg x 1 and heparin  drip started.  Discussed the case with Dr. Gail, cardiology, will assist with the management.  Due to severe sepsis 2/2 to UTI, possible pneumonia, and recent history of ESBL E. Coli UTI, added meropenem.  Transthoracic echocardiogram and fasting lipid panel results will be followed in the morning.  UA/Ucx are pending.  The patient also has a new O2 requirement, 5L.   Updated the patient's daughter via phone.  CODE STATUS changed from full code to DNR.   Critical care time: 35 minutes.

## 2024-06-16 NOTE — Progress Notes (Signed)
 NT suction completed. Minimal tan, scant bloody, thick sputum for notable output.   Oral care completed and dentures removed. Found old concerning amount of emesis in oral cavity and around dentures. Dentures secure in denture cup.

## 2024-06-16 NOTE — Progress Notes (Signed)
 Unable to do admission with patient will call family

## 2024-06-16 NOTE — Progress Notes (Signed)
 Progress Note   Patient: Kristen Bowman FMW:[id number] DOB: [birth year] DOA: 06/15/2024     1 DOS: the patient was seen and examined on 06/16/2024   Assessment and Plan:  # Acute hypoxic respite failure - Presented with SpO2 of 88% on RA with tachypnea in the 20s - Likely secondary to aspiration pneumonia - O2 requirement continue to increase, currently on 15L NRB with tachypnea in the 30s - Not felt to be candidate for BiPAP given mental status and presentation with vomiting and likely aspiration. Confirmed with PCCM. - Wean O2 as tolerated - Discussed with family that the recommendation is transition to comfort care if O2 requirement continues to escalate   # Sepsis  - Presented with leukocytosis to 12.6, tachypnea 27, lactic acid elevated to 3.4 with AMS and AKI, with concern for underlying aspiration pneumonia and UTI - Management of underlying etiology as noted below  # Possible aspiration pneumonia - Concern for aspiration pneumonia given encephalopathy with findings concerning for old emesis by RN on oral care  - CXR (10/22) showing new confluent new retrocardiac opacity on the left compatible with new or increased left lower lung collapse or consolidation - Procalcitonin 0.21 today - Febrile to 101 F this morning - Vanc discontinued after negative MRSA PCR - Continue meropenem - Aspiration precautions - SLP ordered for eval  # DKA - resolving - Presented with hyperglycemia to 770s in the setting of not receiving insulin  at SNF during 5-day stay. BhB elevated to > 8 on admission - Was started on insulin  drip - BhB now wnl - AG closed at 10  - Patient remains somnolent, unable to follow commands, and at risk for aspiration.  Therefore, will continue insulin  drip and D5 1/2 NS - Diabetes coordinator following  #Lactic acidosis  - Episodes of recurrent lactic acidosis, possibly secondary to aspiration pneumonia and dehydration - Continue D5 1/2 NS at 75 mL/hr  # High  anion gap metabolic acidosis - resolving - Likely secondary to lactic acidosis and DKA - Continue management of underlying causes as above  # Possible UTI - UA concerning for UTI. Patient encephalopathic and unable to reliably communicate symptoms - History of ESBL E. Coli - Urine culture pending - Continue meropenem  # Hypernatremia - improving - Elevated Na in the setting of dehydration and osmotic diuresis from DKA - Na improved to 147 - Continue D5 1/2 NS at 75 mL/hr  #Acute metabolic encephalopathy - Likely secondary to acute illness caused by aspiration pneumonia in the setting of underlying dementia - Treating underlying causes as above  #AKI on CKD stage IIIa - improving - Creatinine elevated 2.74 on admission, baseline around 0.9 - Likely prerenal in the setting of dehydration from vomiting and osmotic diuresis - Improving with IVFs  #Elevated troponins -High-sensitivity troponins peaked at 192 - Likely secondary to acute illness, aspiration pneumonia, DKA  - Less likely ACS - Echo showed LVEF 70 to 75% with no regional wall motion normalities.  Noted severe asymmetric left ventricular hypertrophy of the basal septal segment - Cardiology was consulted overnight - indicated unlikely ACS if no abnormalities on echo - Heparin  drip discontinued  #Dementia #Anxiety #Depression - Holding home Aricept , Zoloft , risperidone , trazodone , depakote  given patient is lethargic and NPO      Subjective: Patient seen bedside.  Overnight, was noted to be more somnolent with increased oxygen requirement.  Was titrated up to 15 L nonrebreather.  Febrile to 28 F today.  Continues to be lethargic, opens eyes  to voice and mumbles answers to simple questions.  Not following commands meaningfully.   Physical Exam: Vitals:   06/16/24 0755 06/16/24 0806 06/16/24 0951 06/16/24 0955  BP: (!) 130/48  (!) 150/59   Pulse: 86 88 (!) 113 (!) 120  Resp: (!) 21 (!) 23 18 (!) 26  Temp: 99.9 F  (37.7 C)   (!) 101 F (38.3 C)  TempSrc: Axillary   Axillary  SpO2:  91% 91% 94%  Weight:      Height:       Physical Exam Constitutional:      General: She is not in acute distress.    Appearance: She is ill-appearing.     Interventions: Face mask in place.  Cardiovascular:     Rate and Rhythm: Regular rhythm. Tachycardia present.     Heart sounds: Normal heart sounds. No murmur heard. Pulmonary:     Effort: Pulmonary effort is normal. No respiratory distress.     Breath sounds: Examination of the right-middle field reveals rhonchi. Examination of the left-middle field reveals rhonchi. Examination of the right-lower field reveals rhonchi. Examination of the left-lower field reveals decreased breath sounds. Decreased breath sounds and rhonchi present. No wheezing.  Abdominal:     General: There is no distension.     Palpations: Abdomen is soft.     Tenderness: There is no abdominal tenderness.  Musculoskeletal:     Right lower leg: No edema.     Left lower leg: No edema.  Skin:    General: Skin is warm and dry.     Capillary Refill: Capillary refill takes less than 2 seconds.  Neurological:     Mental Status: She is lethargic.     Family Communication: Spoke to patient's daughter, Kristen Bowman, and granddaughter, Kristen Bowman, over the phone.  Discussed that the patient is in serious condition and may decompensate at any time, at which time there would be very little to be done in terms of escalation of medical care. They are understanding of the severity of the situation. They maintain a code status of DNR/DNI.  Disposition: Status is: Inpatient Remains inpatient appropriate because: Treatment of sepsis secondary to aspiration pneumonia and UTI with IV antibiotics, continued need for IV insulin   Planned Discharge Destination: TBD    Time spent: 60 minutes  Author: Duffy Larch, MD 06/16/2024 10:06 AM  For on call review [email or web address].

## 2024-06-16 NOTE — Progress Notes (Signed)
MRSA PCR swab collected and sent to lab.

## 2024-06-16 NOTE — Progress Notes (Signed)
 PHARMACY - ANTICOAGULATION CONSULT NOTE  Pharmacy Consult for Heparin   Indication: chest pain/ACS, elevated trop  Allergies  Allergen Reactions   Ace Inhibitors Shortness Of Breath and Swelling   Tape Other (See Comments)    Skin bruises and tears easily    Patient Measurements: Height: 5' 6 (167.6 cm) Weight: 62.8 kg (138 lb 7.2 oz) IBW/kg (Calculated) : 59.3 HEPARIN  DW (KG): 74.3  Vital Signs: Temp: 98.2 F (36.8 C) (10/21 2350) Temp Source: Axillary (10/21 2350) BP: 114/36 (10/22 0150) Pulse Rate: 88 (10/22 0150)  Labs: Recent Labs    06/15/24 1440 06/15/24 1450 06/15/24 1754 06/15/24 2045 06/16/24 0225  HGB 11.7* 11.9*  --   --   --   HCT 39.7 35.0*  --   --   --   PLT 229  --   --   --   --   CREATININE 2.74*  --  2.54*  --  1.94*  TROPONINIHS  --   --  100* 129* 192*    Estimated Creatinine Clearance: 17.7 mL/min (A) (by C-G formula based on SCr of 1.94 mg/dL (H)).   Medical History: Past Medical History:  Diagnosis Date   Anemia    Anxiety states    Diabetes mellitus    T2DM with renal manifestations   Generalized osteoarthrosis, unspecified site    Hematuria    Hyperlipidemia    mixed   Hypertension    Irritable bowel syndrome    Other specified cardiac dysrhythmias(427.89)    atrial fibrilation   Panic disorder    Polymyalgia rheumatica    Proteinuria 05/29/10   Type II or unspecified type diabetes mellitus with renal manifestations, uncontrolled(250.42) 05/29/10   CKD II (mild)    Assessment: 89 y/o F with progressive lethargy and elevated troponin. Starting heparin  for now. Above labs reviewed. PTA meds reviewed.   Goal of Therapy:  Heparin  level 0.3-0.7 units/ml Monitor platelets by anticoagulation protocol: Yes   Plan:  Will defer bolus with recent subcutaneous heparin  Start heparin  drip at 700 units/hr Heparin  level in 8 hours Daily CBC and heparin  level Monitor for bleeding  Lynwood Mckusick, PharmD, BCPS Clinical  Pharmacist Phone: [phone]

## 2024-06-16 NOTE — Telephone Encounter (Signed)
 I spoke with Kristen Bowman, she was on the phone, said, she would have to have her family call back.

## 2024-06-16 NOTE — Progress Notes (Signed)
 Unable to complete suicidescreeningdue to patient confusion

## 2024-06-16 NOTE — Inpatient Diabetes Management (Signed)
 Inpatient Diabetes Program Recommendations  AACE/ADA: New Consensus Statement on Inpatient Glycemic Control (2015)  Target Ranges:  Prepandial:   less than 140 mg/dL      Peak postprandial:   less than 180 mg/dL (1-2 hours)      Critically ill patients:  140 - 180 mg/dL   Lab Results  Component Value Date   GLUCAP 248 (H) 06/16/2024   HGBA1C 7.0 (H) 09/25/2012    Review of Glycemic Control  Latest Reference Range & Units 06/16/24 03:10 06/16/24 04:25 06/16/24 05:43 06/16/24 06:40 06/16/24 07:59 06/16/24 09:55 06/16/24 10:56  Glucose-Capillary 70 - 99 mg/dL 800 (H) 828 (H) 862 (H) 157 (H) 137 (H) 255 (H) 248 (H)  (H): Data is abnormally high Diabetes history: Type 2 DM Outpatient Diabetes medications: Humalog  4-8 units TID, Metformin  500 mg BID, Lantus  20 units QHS Current orders for Inpatient glycemic control: IV insulin   Inpatient Diabetes Program Recommendations:    When ready to transition consider and diet advances: -Lantus  18 units two hours prior to discontinuation, then every day to follow -Novolog  0-9 units TID & HS -Novolog  3 units TID (assuming that patient is consuming >50% of meals)  Thanks, Tinnie Minus, MSN, RNC-OB Diabetes Coordinator [phone] (8a-5p)

## 2024-06-17 DIAGNOSIS — F0394 Unspecified dementia, unspecified severity, with anxiety: Secondary | ICD-10-CM | POA: Diagnosis present

## 2024-06-17 DIAGNOSIS — I272 Pulmonary hypertension, unspecified: Secondary | ICD-10-CM | POA: Diagnosis present

## 2024-06-17 DIAGNOSIS — N179 Acute kidney failure, unspecified: Secondary | ICD-10-CM | POA: Diagnosis present

## 2024-06-17 DIAGNOSIS — Z66 Do not resuscitate: Secondary | ICD-10-CM | POA: Diagnosis not present

## 2024-06-17 DIAGNOSIS — I700 Atherosclerosis of aorta: Secondary | ICD-10-CM | POA: Diagnosis present

## 2024-06-17 DIAGNOSIS — N1831 Chronic kidney disease, stage 3a: Secondary | ICD-10-CM | POA: Diagnosis present

## 2024-06-17 DIAGNOSIS — L89153 Pressure ulcer of sacral region, stage 3: Secondary | ICD-10-CM | POA: Diagnosis present

## 2024-06-17 DIAGNOSIS — Z515 Encounter for palliative care: Secondary | ICD-10-CM | POA: Diagnosis not present

## 2024-06-17 DIAGNOSIS — E46 Unspecified protein-calorie malnutrition: Secondary | ICD-10-CM | POA: Diagnosis present

## 2024-06-17 DIAGNOSIS — D631 Anemia in chronic kidney disease: Secondary | ICD-10-CM | POA: Diagnosis present

## 2024-06-17 DIAGNOSIS — G9341 Metabolic encephalopathy: Secondary | ICD-10-CM | POA: Diagnosis present

## 2024-06-17 DIAGNOSIS — J9601 Acute respiratory failure with hypoxia: Secondary | ICD-10-CM | POA: Diagnosis present

## 2024-06-17 DIAGNOSIS — E1022 Type 1 diabetes mellitus with diabetic chronic kidney disease: Secondary | ICD-10-CM | POA: Diagnosis present

## 2024-06-17 DIAGNOSIS — F03918 Unspecified dementia, unspecified severity, with other behavioral disturbance: Secondary | ICD-10-CM | POA: Diagnosis present

## 2024-06-17 DIAGNOSIS — R4182 Altered mental status, unspecified: Secondary | ICD-10-CM | POA: Diagnosis not present

## 2024-06-17 DIAGNOSIS — E1010 Type 1 diabetes mellitus with ketoacidosis without coma: Secondary | ICD-10-CM | POA: Diagnosis present

## 2024-06-17 DIAGNOSIS — F0390 Unspecified dementia without behavioral disturbance: Secondary | ICD-10-CM | POA: Diagnosis not present

## 2024-06-17 DIAGNOSIS — M353 Polymyalgia rheumatica: Secondary | ICD-10-CM | POA: Diagnosis present

## 2024-06-17 DIAGNOSIS — I129 Hypertensive chronic kidney disease with stage 1 through stage 4 chronic kidney disease, or unspecified chronic kidney disease: Secondary | ICD-10-CM | POA: Diagnosis present

## 2024-06-17 DIAGNOSIS — I2489 Other forms of acute ischemic heart disease: Secondary | ICD-10-CM | POA: Diagnosis present

## 2024-06-17 DIAGNOSIS — A419 Sepsis, unspecified organism: Secondary | ICD-10-CM | POA: Diagnosis present

## 2024-06-17 DIAGNOSIS — N390 Urinary tract infection, site not specified: Secondary | ICD-10-CM | POA: Diagnosis present

## 2024-06-17 DIAGNOSIS — L89303 Pressure ulcer of unspecified buttock, stage 3: Secondary | ICD-10-CM | POA: Diagnosis not present

## 2024-06-17 DIAGNOSIS — J690 Pneumonitis due to inhalation of food and vomit: Secondary | ICD-10-CM | POA: Diagnosis present

## 2024-06-17 DIAGNOSIS — E1110 Type 2 diabetes mellitus with ketoacidosis without coma: Secondary | ICD-10-CM | POA: Diagnosis not present

## 2024-06-17 DIAGNOSIS — G9340 Encephalopathy, unspecified: Secondary | ICD-10-CM | POA: Diagnosis not present

## 2024-06-17 DIAGNOSIS — F0393 Unspecified dementia, unspecified severity, with mood disturbance: Secondary | ICD-10-CM | POA: Diagnosis present

## 2024-06-17 DIAGNOSIS — Z7189 Other specified counseling: Secondary | ICD-10-CM | POA: Diagnosis not present

## 2024-06-17 DIAGNOSIS — F32A Depression, unspecified: Secondary | ICD-10-CM | POA: Diagnosis present

## 2024-06-17 DIAGNOSIS — R6520 Severe sepsis without septic shock: Secondary | ICD-10-CM | POA: Diagnosis present

## 2024-06-17 LAB — BASIC METABOLIC PANEL WITH GFR
Anion gap: 12 (ref 5–15)
Anion gap: 13 (ref 5–15)
Anion gap: 12 (ref 5–15)
Anion gap: 11 (ref 5–15)
Anion gap: 11 (ref 5–15)
Anion gap: 14 (ref 5–15)
Anion gap: 11 (ref 5–15)
BUN: 41 mg/dL — ABNORMAL HIGH (ref 8–23)
BUN: 35 mg/dL — ABNORMAL HIGH (ref 8–23)
BUN: 31 mg/dL — ABNORMAL HIGH (ref 8–23)
BUN: 26 mg/dL — ABNORMAL HIGH (ref 8–23)
BUN: 24 mg/dL — ABNORMAL HIGH (ref 8–23)
BUN: 24 mg/dL — ABNORMAL HIGH (ref 8–23)
BUN: 24 mg/dL — ABNORMAL HIGH (ref 8–23)
CO2: 25 mmol/L (ref 22–32)
CO2: 25 mmol/L (ref 22–32)
CO2: 26 mmol/L (ref 22–32)
CO2: 24 mmol/L (ref 22–32)
CO2: 25 mmol/L (ref 22–32)
CO2: 24 mmol/L (ref 22–32)
CO2: 25 mmol/L (ref 22–32)
Calcium: 9.1 mg/dL (ref 8.9–10.3)
Calcium: 9.2 mg/dL (ref 8.9–10.3)
Calcium: 9 mg/dL (ref 8.9–10.3)
Calcium: 8.8 mg/dL — ABNORMAL LOW (ref 8.9–10.3)
Calcium: 8.8 mg/dL — ABNORMAL LOW (ref 8.9–10.3)
Calcium: 9 mg/dL (ref 8.9–10.3)
Calcium: 8.7 mg/dL — ABNORMAL LOW (ref 8.9–10.3)
Chloride: 114 mmol/L — ABNORMAL HIGH (ref 98–111)
Chloride: 114 mmol/L — ABNORMAL HIGH (ref 98–111)
Chloride: 113 mmol/L — ABNORMAL HIGH (ref 98–111)
Chloride: 112 mmol/L — ABNORMAL HIGH (ref 98–111)
Chloride: 112 mmol/L — ABNORMAL HIGH (ref 98–111)
Chloride: 111 mmol/L (ref 98–111)
Chloride: 110 mmol/L (ref 98–111)
Creatinine, Ser: 1.23 mg/dL — ABNORMAL HIGH (ref 0.44–1.00)
Creatinine, Ser: 0.98 mg/dL (ref 0.44–1.00)
Creatinine, Ser: 0.88 mg/dL (ref 0.44–1.00)
Creatinine, Ser: 0.82 mg/dL (ref 0.44–1.00)
Creatinine, Ser: 0.79 mg/dL (ref 0.44–1.00)
Creatinine, Ser: 0.9 mg/dL (ref 0.44–1.00)
Creatinine, Ser: 0.81 mg/dL (ref 0.44–1.00)
GFR, Estimated: 41 mL/min — ABNORMAL LOW (ref >60)
GFR, Estimated: 54 mL/min — ABNORMAL LOW (ref >60)
GFR, Estimated: >60 mL/min (ref >60)
GFR, Estimated: >60 mL/min (ref >60)
GFR, Estimated: >60 mL/min (ref >60)
GFR, Estimated: >60 mL/min (ref >60)
GFR, Estimated: >60 mL/min (ref >60)
Glucose, Bld: 186 mg/dL — ABNORMAL HIGH (ref 70–99)
Glucose, Bld: 161 mg/dL — ABNORMAL HIGH (ref 70–99)
Glucose, Bld: 199 mg/dL — ABNORMAL HIGH (ref 70–99)
Glucose, Bld: 156 mg/dL — ABNORMAL HIGH (ref 70–99)
Glucose, Bld: 149 mg/dL — ABNORMAL HIGH (ref 70–99)
Glucose, Bld: 251 mg/dL — ABNORMAL HIGH (ref 70–99)
Glucose, Bld: 187 mg/dL — ABNORMAL HIGH (ref 70–99)
Potassium: 4.1 mmol/L (ref 3.5–5.1)
Potassium: 3.7 mmol/L (ref 3.5–5.1)
Potassium: 2.9 mmol/L — ABNORMAL LOW (ref 3.5–5.1)
Potassium: 3.3 mmol/L — ABNORMAL LOW (ref 3.5–5.1)
Potassium: 3.6 mmol/L (ref 3.5–5.1)
Potassium: 3.3 mmol/L — ABNORMAL LOW (ref 3.5–5.1)
Potassium: 4.1 mmol/L (ref 3.5–5.1)
Sodium: 151 mmol/L — ABNORMAL HIGH (ref 135–145)
Sodium: 152 mmol/L — ABNORMAL HIGH (ref 135–145)
Sodium: 151 mmol/L — ABNORMAL HIGH (ref 135–145)
Sodium: 147 mmol/L — ABNORMAL HIGH (ref 135–145)
Sodium: 148 mmol/L — ABNORMAL HIGH (ref 135–145)
Sodium: 149 mmol/L — ABNORMAL HIGH (ref 135–145)
Sodium: 146 mmol/L — ABNORMAL HIGH (ref 135–145)

## 2024-06-17 LAB — CBC
HCT: 39.7 % (ref 36.0–46.0)
Hemoglobin: 12.5 g/dL (ref 12.0–15.0)
MCH: 27.8 pg (ref 26.0–34.0)
MCHC: 31.5 g/dL (ref 30.0–36.0)
MCV: 88.4 fL (ref 80.0–100.0)
Platelets: 153 K/uL (ref 150–400)
RBC: 4.49 MIL/uL (ref 3.87–5.11)
RDW: 16 % — ABNORMAL HIGH (ref 11.5–15.5)
WBC: 9.8 K/uL (ref 4.0–10.5)
nRBC: 0 % (ref 0.0–0.2)

## 2024-06-17 LAB — URINE CULTURE: Culture: >=100000 — AB

## 2024-06-17 LAB — GLUCOSE, CAPILLARY
Glucose-Capillary: 122 mg/dL — ABNORMAL HIGH (ref 70–99)
Glucose-Capillary: 130 mg/dL — ABNORMAL HIGH (ref 70–99)
Glucose-Capillary: 135 mg/dL — ABNORMAL HIGH (ref 70–99)
Glucose-Capillary: 139 mg/dL — ABNORMAL HIGH (ref 70–99)
Glucose-Capillary: 144 mg/dL — ABNORMAL HIGH (ref 70–99)
Glucose-Capillary: 146 mg/dL — ABNORMAL HIGH (ref 70–99)
Glucose-Capillary: 155 mg/dL — ABNORMAL HIGH (ref 70–99)
Glucose-Capillary: 161 mg/dL — ABNORMAL HIGH (ref 70–99)
Glucose-Capillary: 162 mg/dL — ABNORMAL HIGH (ref 70–99)
Glucose-Capillary: 165 mg/dL — ABNORMAL HIGH (ref 70–99)
Glucose-Capillary: 171 mg/dL — ABNORMAL HIGH (ref 70–99)
Glucose-Capillary: 171 mg/dL — ABNORMAL HIGH (ref 70–99)
Glucose-Capillary: 175 mg/dL — ABNORMAL HIGH (ref 70–99)
Glucose-Capillary: 178 mg/dL — ABNORMAL HIGH (ref 70–99)
Glucose-Capillary: 181 mg/dL — ABNORMAL HIGH (ref 70–99)
Glucose-Capillary: 191 mg/dL — ABNORMAL HIGH (ref 70–99)
Glucose-Capillary: 192 mg/dL — ABNORMAL HIGH (ref 70–99)
Glucose-Capillary: 204 mg/dL — ABNORMAL HIGH (ref 70–99)
Glucose-Capillary: 221 mg/dL — ABNORMAL HIGH (ref 70–99)
Glucose-Capillary: 269 mg/dL — ABNORMAL HIGH (ref 70–99)

## 2024-06-17 LAB — RESP PANEL BY RT-PCR (RSV, FLU A&B, COVID)  RVPGX2
Influenza A by PCR: NEGATIVE
Influenza B by PCR: NEGATIVE
Resp Syncytial Virus by PCR: NEGATIVE
SARS Coronavirus 2 by RT PCR: NEGATIVE

## 2024-06-17 LAB — HEMOGLOBIN A1C
Hgb A1c MFr Bld: 9.4 % — ABNORMAL HIGH (ref 4.8–5.6)
Mean Plasma Glucose: 223 mg/dL

## 2024-06-17 LAB — PHOSPHORUS: Phosphorus: 1.3 mg/dL — ABNORMAL LOW (ref 2.5–4.6)

## 2024-06-17 LAB — MAGNESIUM: Magnesium: 1.4 mg/dL — ABNORMAL LOW (ref 1.7–2.4)

## 2024-06-17 LAB — LACTIC ACID, PLASMA: Lactic Acid, Venous: 1.9 mmol/L (ref 0.5–1.9)

## 2024-06-17 MED ORDER — POTASSIUM CHLORIDE 10 MEQ/100ML IV SOLN
10.0000 meq | INTRAVENOUS | Status: AC
  Administered 2024-06-17 (×3): 10 meq via INTRAVENOUS
  Filled 2024-06-17 (×3): qty 100

## 2024-06-17 MED ORDER — MAGNESIUM SULFATE 2 GM/50ML IV SOLN
2.0000 g | Freq: Once | INTRAVENOUS | Status: AC
  Administered 2024-06-17: 2 g via INTRAVENOUS
  Filled 2024-06-17: qty 50

## 2024-06-17 MED ORDER — HYDRALAZINE HCL 20 MG/ML IJ SOLN
20.0000 mg | Freq: Four times a day (QID) | INTRAMUSCULAR | Status: DC | PRN
  Administered 2024-06-17 – 2024-06-18 (×3): 20 mg via INTRAVENOUS
  Filled 2024-06-17 (×3): qty 1

## 2024-06-17 MED ORDER — DEXTROSE 5 % IV SOLN: INTRAVENOUS | Status: AC

## 2024-06-17 MED ORDER — POTASSIUM CHLORIDE 10 MEQ/100ML IV SOLN
10.0000 meq | INTRAVENOUS | Status: AC
  Administered 2024-06-17 (×4): 10 meq via INTRAVENOUS
  Filled 2024-06-17 (×4): qty 100

## 2024-06-17 MED ORDER — POTASSIUM PHOSPHATES 15 MMOLE/5ML IV SOLN
30.0000 mmol | Freq: Once | INTRAVENOUS | Status: AC
  Administered 2024-06-17: 30 mmol via INTRAVENOUS
  Filled 2024-06-17: qty 10

## 2024-06-17 MED ORDER — HYDRALAZINE HCL 20 MG/ML IJ SOLN
10.0000 mg | Freq: Four times a day (QID) | INTRAMUSCULAR | Status: DC | PRN
  Administered 2024-06-17: 10 mg via INTRAVENOUS
  Filled 2024-06-17: qty 1

## 2024-06-17 MED ORDER — NYSTATIN 100000 UNIT/ML MT SUSP
5.0000 mL | Freq: Four times a day (QID) | OROMUCOSAL | Status: DC
  Administered 2024-06-17 – 2024-06-29 (×30): 500000 [IU] via ORAL
  Filled 2024-06-17 (×33): qty 5

## 2024-06-17 NOTE — Progress Notes (Signed)
 Spoke with Mirco. Mirco confirms they have respiratory panel PRC swab from 06/16/2024, and will use this sample to complete COVID test.

## 2024-06-17 NOTE — Progress Notes (Addendum)
 Progress Note   Patient: Kristen Bowman FMW:[id number] DOB: 1931/04/09 DOA: 06/15/2024     2 DOS: the patient was seen and examined on 06/17/2024   Assessment and Plan:  # Acute hypoxic respite failure - improving - Presented with SpO2 of 88% on RA with tachypnea in the 20s - Likely secondary to aspiration pneumonia - O2 requirement is improving significantly, off NRB and down to 5L HFNC this morning and ultimately down to 2 L/min - Continue to wean O2 as tolerated - Discussed with family that the recommendation is transition to comfort care if O2 requirement continues to escalate   # Sepsis  - Presented with leukocytosis to 12.6, tachypnea 27, lactic acid elevated to 3.4 with AMS and AKI, with concern for underlying aspiration pneumonia and UTI - Management of underlying etiology as noted below  # Possible aspiration pneumonia - Concern for aspiration pneumonia given encephalopathy with findings concerning for old emesis by RN on oral care  - CXR (10/22) showing new confluent new retrocardiac opacity on the left compatible with new or increased left lower lung collapse or consolidation - RPP negative - Procalcitonin 0.21 on 10/22 - Febrile to 100.4 F overnight - Leukocytosis resolved with WBC improving to 9.8 - Blood cultures (10/21) NGTD - Vanc discontinued after negative MRSA PCR - Continue meropenem  - Aspiration precautions - SLP ordered for eval - recommended continued NPO at this time  # DKA - resolving - Presented with hyperglycemia to 770s in the setting of not receiving insulin  at SNF during 5-day stay. BhB elevated to > 8 on admission - Was started on insulin  drip - BhB wnl - AG remains closed this morning - Patient's mental status improved significantly, able to communicate and follow commands. However, SLP recommended continued NPO. When cleared for PO intake, will transition of insulin  drip.  - Diabetes coordinator following  #Lactic acidosis  - Episodes of  recurrent lactic acidosis, possibly secondary to aspiration pneumonia and dehydration - Improved to 1.9 this morning from 3.0 - Continue IVFs until patient is cleared by SLP for p.o. intake  # High anion gap metabolic acidosis - resolved - Likely secondary to lactic acidosis and DKA - Continue management of underlying causes as above  # Possible UTI - UA concerning for UTI. Patient encephalopathic and unable to reliably communicate symptoms - History of ESBL E. Coli - Urine culture showing greater than 100,000 colonies of multiple species and suggested recollection.  However, patient receiving antibiotics with clinical improvement, so repeat UA/UCx futile at this point - Continue meropenem  # Hypernatremia - improving - Elevated Na in the setting of dehydration and osmotic diuresis from DKA - Na improved to 147 - IVFs switched to D5W  # Hypokalemia - K 2.9 - Ordered IV KCl 10 mEq x 4  #Hypertension - Patient with elevated BP to 170-190s the majority of the day - PRN hydralazine  20 mg q6h and PRN Labetalol  5 mg q2h ordered - Options limited at this time given patient is NPO.  #Acute metabolic encephalopathy - improving - Likely secondary to acute illness caused by aspiration pneumonia in the setting of underlying dementia - Continue treating underlying causes as above - Delirium precautions  #AKI on CKD stage IIIa - resolved - Creatinine elevated 2.74 on admission, baseline around 0.9 - Likely prerenal in the setting of dehydration from vomiting and osmotic diuresis - Improved with IVFs - Cr back at baseline of 0.88  #Elevated troponins -High-sensitivity troponins peaked at 192 - Likely  secondary to acute illness, aspiration pneumonia, DKA  - Less likely ACS - Echo showed LVEF 70 to 75% with no regional wall motion normalities.  Noted severe asymmetric left ventricular hypertrophy of the basal septal segment - Cardiology evaluated - indicated unlikely ACS if no  abnormalities on echo - Heparin  drip discontinued on 10/22  #Dementia #Anxiety #Depression - Holding home Aricept , Zoloft , risperidone , trazodone , depakote  given patient is lethargic and NPO  Subjective: Patient seen bedside.  She is significantly more awake and alert today.  She is oriented only to person.  She is communicative and follows commands. She is coughing. SLP evaluated her, concern for oral holding and coughing, kept NPO.   Denies chest pain, nausea/vomiting, abdominal pain, fevers, chills.   Physical Exam: Vitals:   06/17/24 0315 06/17/24 0352 06/17/24 0621 06/17/24 0744  BP: (!) 199/79 (!) 170/60 (!) 190/63 (!) 195/70  Pulse: 94 91 89 91  Resp: (!) 21 (!) 23 (!) 24 (!) 21  Temp: 98.8 F (37.1 C)   99 F (37.2 C)  TempSrc: Oral   Axillary  SpO2: 98% 97% 99% 99%  Weight:      Height:       Physical Exam Constitutional:      General: She is not in acute distress.    Appearance: She is ill-appearing.     Interventions: Face mask in place.  Cardiovascular:     Rate and Rhythm: Regular rhythm. Tachycardia present.     Heart sounds: Normal heart sounds. No murmur heard. Pulmonary:     Effort: Pulmonary effort is normal. No respiratory distress.     Breath sounds: Examination of the right-middle field reveals rhonchi. Examination of the left-middle field reveals rhonchi. Examination of the right-lower field reveals rhonchi. Examination of the left-lower field reveals decreased breath sounds. Decreased breath sounds and rhonchi present. No wheezing.  Abdominal:     General: There is no distension.     Palpations: Abdomen is soft.     Tenderness: There is no abdominal tenderness.  Musculoskeletal:     Right lower leg: No edema.     Left lower leg: No edema.  Skin:    General: Skin is warm and dry.     Capillary Refill: Capillary refill takes less than 2 seconds.  Neurological:     Mental Status: She is lethargic.     Family Communication: None at  bedside  Disposition: Status is: Inpatient Remains inpatient appropriate because: Treatment of sepsis secondary to aspiration pneumonia and UTI with IV antibiotics, continued need for IV insulin  given NPO Planned Discharge Destination: TBD  DVT prophylaxis: heparin  injection 5,000 Units Start: 06/16/24 2200   Time spent: 57 minutes  Author: Duffy Larch, MD 06/17/2024 9:25 AM  For on call review [email or web address].

## 2024-06-17 NOTE — Plan of Care (Signed)
   Problem: Fluid Volume: Goal: Ability to maintain a balanced intake and output will improve Outcome: Progressing   Problem: Metabolic: Goal: Ability to maintain appropriate glucose levels will improve Outcome: Progressing   Problem: Skin Integrity: Goal: Risk for impaired skin integrity will decrease Outcome: Progressing

## 2024-06-17 NOTE — Inpatient Diabetes Management (Addendum)
 Inpatient Diabetes Program Recommendations  AACE/ADA: New Consensus Statement on Inpatient Glycemic Control (2015)  Target Ranges:  Prepandial:   less than 140 mg/dL      Peak postprandial:   less than 180 mg/dL (1-2 hours)      Critically ill patients:  140 - 180 mg/dL   Lab Results  Component Value Date   GLUCAP 171 (H) 06/17/2024   HGBA1C 9.4 (H) 06/15/2024    Review of Glycemic Control  Latest Reference Range & Units 06/17/24 07:12  CO2 22 - 32 mmol/L 26    Diabetes history: DM2 Outpatient Diabetes medications: Lantus  20 units at bedtime, Humalog  3-8 units TID, Metformin  750 mg QAM Current orders for Inpatient glycemic control: IV insulin   Dementia DKA  Inpatient Diabetes Program Recommendations:    When MD is ready to transition to SQ insulin , Might consider:  Semglee  15 units 2 hrs prior to discontinuing IV insulin  and then QD Novolog  0-9 units Q4H while NPO or 0-9 units TID once eating  Thank you, Kristen Pinal, MSN, CDCES Diabetes Coordinator Inpatient Diabetes Program [phone] (team pager from 8a-5p)

## 2024-06-17 NOTE — Plan of Care (Signed)
    Referral received for Kristen Bowman :goals of care discussion. Chart reviewed in detail. Patient assessed and is unable to engage appropriately in discussions. Attempted to contact patient's daughter [name]. Unable to reach. Voicemail left with contact information given.   PMT will re-attempt to contact family at a later time/date. Detailed note and recommendations to follow once GOC has been completed.   Thank you for your referral and allowing PMT to assist in [name]'s care.   Alaja Goldinger, PA-C Palliative Medicine Team  Team Phone # [phone]   NO CHARGE

## 2024-06-17 NOTE — Evaluation (Addendum)
 Clinical/Bedside Swallow Evaluation Patient Details  Name: Kristen Bowman MRN: [id number] Date of Birth: [birth year]  Today's Date: 06/17/2024 Time: SLP Start Time (ACUTE ONLY): 0915 SLP Stop Time (ACUTE ONLY): 0946 SLP Time Calculation (min) (ACUTE ONLY): 31 min  Past Medical History:  Past Medical History:  Diagnosis Date   Anemia    Anxiety states    Diabetes mellitus    T2DM with renal manifestations   Generalized osteoarthrosis, unspecified site    Hematuria    Hyperlipidemia    mixed   Hypertension    Irritable bowel syndrome    Other specified cardiac dysrhythmias(427.89)    atrial fibrilation   Panic disorder    Polymyalgia rheumatica    Proteinuria 05/29/10   Type II or unspecified type diabetes mellitus with renal manifestations, uncontrolled(250.42) 05/29/10   CKD II (mild)   Past Surgical History:  Past Surgical History:  Procedure Laterality Date   CATARACT EXTRACTION     right   COLONOSCOPY N/A 11/04/2012   Procedure: COLONOSCOPY;  Surgeon: Elsie Cree, MD;  Location: [facility];  Service: Endoscopy;  Laterality: N/A;   ESOPHAGOGASTRODUODENOSCOPY N/A 11/04/2012   Procedure: ESOPHAGOGASTRODUODENOSCOPY (EGD);  Surgeon: Elsie Cree, MD;  Location: [facility];  Service: Endoscopy;  Laterality: N/A;   TUBAL LIGATION     HPI:  Patient is a 89 y.o. female with past medical history of diabetes mellitus type 2, hypertension, history of E. Coli UTI and associated encephalopahty, right bundle branch block, chronic anemia, chronic kidney disease stage II, anemia, dementia, depression independent and oriented at baseline come from AMS and hyperglycemia. Patient was receiving respite care at a SNF and was admitted to the hospital on 10/21 secondary to AMS hyperglycemia (daughter suspects patient was not being given her insulin ) and altered mental status and UA was positive for UTI. Per RN note from 10/22, during oral care, suspected emesis in oral cavity and around  dentures. Patient NPO due to concerns for aspiration. Bedside swallow evaluation ordered to assess patient's swallow.    Assessment / Plan / Recommendation  Clinical Impression  Patient presents with clinical s/s of dysphagia as per this BSE. She was awake and alert but would become drowsy if not receiving stimulation. Her daugther was at bedside. Patient was edentulous but her daughter indicated her dentures are at home and she had no restrictions to her diet before this. Oral thrush was noted during oral care. SLP applied mouth moisturizer to patient's mouth following oral care. After oral care, SLP assessed patient's swallow via PO's of thin liquids. Oral holding was observed with water  and an immediate cough, throat clear, and wet vocal quality was observed with both water  and grape juice. Patient with audible secretions but cough was ineffective at clearing. SLP communicated with RN and MD. Recommending continue NPO and SLP will continue to follow for PO trials. SLP Visit Diagnosis: Dysphagia, unspecified (R13.10)    Aspiration Risk  Mild aspiration risk    Diet Recommendation NPO    Liquid Administration via: Spoon Medication Administration: Via alternative means    Other  Recommendations Oral Care Recommendations: Oral care QID     Assistance Recommended at Discharge    Functional Status Assessment Patient has had a recent decline in their functional status and demonstrates the ability to make significant improvements in function in a reasonable and predictable amount of time.  Frequency and Duration min 2x/week  1 week       Prognosis Prognosis for improved oropharyngeal function: Fair Barriers to Reach  Goals: Cognitive deficits      Swallow Study   General Date of Onset: 06/17/24 HPI: Patient is a 89 y.o. female with past medical history of diabetes mellitus type 2, hypertension, history of E. Coli UTI and associated encephalopahty, right bundle branch block, chronic anemia,  chronic kidney disease stage II, anemia, dementia, depression independent and oriented at baseline come from AMS and hyperglycemia. Patient was receiving respite care at a SNF and was admitted to the hospital on 10/21 secondary to AMS hyperglycemia (daughter suspects patient was not being given her insulin ) and altered mental status and UA was positive for UTI. Per RN note from 10/22, during oral care, suspected emesis in oral cavity and around dentures. Patient NPO due to concerns for aspiration. Bedside swallow evaluation ordered to assess patient's swallow. Type of Study: Bedside Swallow Evaluation Diet Prior to this Study: NPO Temperature Spikes Noted: No Respiratory Status: Nasal cannula History of Recent Intubation: No Behavior/Cognition: Alert;Cooperative;Pleasant mood;Lethargic/Drowsy Oral Cavity Assessment: Other (comment) (oral thrush) Oral Care Completed by SLP: Yes Oral Cavity - Dentition: Dentures, not available (Top and bottom dentures) Self-Feeding Abilities: Total assist Patient Positioning: Upright in bed    Oral/Motor/Sensory Function Overall Oral Motor/Sensory Function: Within functional limits   Ice Chips     Thin Liquid Thin Liquid: Impaired Presentation: Spoon Oral Phase Functional Implications: Oral holding Pharyngeal  Phase Impairments: Throat Clearing - Immediate;Cough - Immediate;Wet Vocal Quality    Nectar Thick     Honey Thick     Puree     Solid            Damien Hy  Graduate SLP Clinican

## 2024-06-18 DIAGNOSIS — Z515 Encounter for palliative care: Secondary | ICD-10-CM | POA: Diagnosis not present

## 2024-06-18 DIAGNOSIS — Z7189 Other specified counseling: Secondary | ICD-10-CM | POA: Diagnosis not present

## 2024-06-18 DIAGNOSIS — Z66 Do not resuscitate: Secondary | ICD-10-CM

## 2024-06-18 DIAGNOSIS — E1010 Type 1 diabetes mellitus with ketoacidosis without coma: Secondary | ICD-10-CM | POA: Diagnosis not present

## 2024-06-18 DIAGNOSIS — G9340 Encephalopathy, unspecified: Secondary | ICD-10-CM | POA: Diagnosis not present

## 2024-06-18 DIAGNOSIS — Z789 Other specified health status: Secondary | ICD-10-CM

## 2024-06-18 DIAGNOSIS — R4182 Altered mental status, unspecified: Secondary | ICD-10-CM | POA: Diagnosis not present

## 2024-06-18 LAB — BASIC METABOLIC PANEL WITH GFR
Anion gap: 13 (ref 5–15)
Anion gap: 10 (ref 5–15)
BUN: 24 mg/dL — ABNORMAL HIGH (ref 8–23)
BUN: 30 mg/dL — ABNORMAL HIGH (ref 8–23)
CO2: 20 mmol/L — ABNORMAL LOW (ref 22–32)
CO2: 23 mmol/L (ref 22–32)
Calcium: 8.4 mg/dL — ABNORMAL LOW (ref 8.9–10.3)
Calcium: 8.7 mg/dL — ABNORMAL LOW (ref 8.9–10.3)
Chloride: 108 mmol/L (ref 98–111)
Chloride: 109 mmol/L (ref 98–111)
Creatinine, Ser: 0.81 mg/dL (ref 0.44–1.00)
Creatinine, Ser: 1.08 mg/dL — ABNORMAL HIGH (ref 0.44–1.00)
GFR, Estimated: >60 mL/min (ref >60)
GFR, Estimated: 48 mL/min — ABNORMAL LOW (ref >60)
Glucose, Bld: 162 mg/dL — ABNORMAL HIGH (ref 70–99)
Glucose, Bld: 256 mg/dL — ABNORMAL HIGH (ref 70–99)
Potassium: 4.8 mmol/L (ref 3.5–5.1)
Potassium: 4.1 mmol/L (ref 3.5–5.1)
Sodium: 141 mmol/L (ref 135–145)
Sodium: 142 mmol/L (ref 135–145)

## 2024-06-18 LAB — CBC
HCT: 41.5 % (ref 36.0–46.0)
HCT: 40.6 % (ref 36.0–46.0)
Hemoglobin: 12.4 g/dL (ref 12.0–15.0)
Hemoglobin: 12.9 g/dL (ref 12.0–15.0)
MCH: 27.7 pg (ref 26.0–34.0)
MCH: 27.9 pg (ref 26.0–34.0)
MCHC: 29.9 g/dL — ABNORMAL LOW (ref 30.0–36.0)
MCHC: 31.8 g/dL (ref 30.0–36.0)
MCV: 92.6 fL (ref 80.0–100.0)
MCV: 87.9 fL (ref 80.0–100.0)
Platelets: 137 K/uL — ABNORMAL LOW (ref 150–400)
Platelets: 173 K/uL (ref 150–400)
RBC: 4.48 MIL/uL (ref 3.87–5.11)
RBC: 4.62 MIL/uL (ref 3.87–5.11)
RDW: 16.5 % — ABNORMAL HIGH (ref 11.5–15.5)
RDW: 16.6 % — ABNORMAL HIGH (ref 11.5–15.5)
WBC: UNDETERMINED K/uL (ref 4.0–10.5)
WBC: 13.5 K/uL — ABNORMAL HIGH (ref 4.0–10.5)
nRBC: UNDETERMINED % (ref 0.0–0.2)
nRBC: 0 % (ref 0.0–0.2)

## 2024-06-18 LAB — GLUCOSE, CAPILLARY
Glucose-Capillary: 136 mg/dL — ABNORMAL HIGH (ref 70–99)
Glucose-Capillary: 142 mg/dL — ABNORMAL HIGH (ref 70–99)
Glucose-Capillary: 147 mg/dL — ABNORMAL HIGH (ref 70–99)
Glucose-Capillary: 163 mg/dL — ABNORMAL HIGH (ref 70–99)
Glucose-Capillary: 163 mg/dL — ABNORMAL HIGH (ref 70–99)
Glucose-Capillary: 167 mg/dL — ABNORMAL HIGH (ref 70–99)
Glucose-Capillary: 175 mg/dL — ABNORMAL HIGH (ref 70–99)
Glucose-Capillary: 179 mg/dL — ABNORMAL HIGH (ref 70–99)
Glucose-Capillary: 189 mg/dL — ABNORMAL HIGH (ref 70–99)
Glucose-Capillary: 191 mg/dL — ABNORMAL HIGH (ref 70–99)
Glucose-Capillary: 196 mg/dL — ABNORMAL HIGH (ref 70–99)
Glucose-Capillary: 202 mg/dL — ABNORMAL HIGH (ref 70–99)
Glucose-Capillary: 207 mg/dL — ABNORMAL HIGH (ref 70–99)
Glucose-Capillary: 237 mg/dL — ABNORMAL HIGH (ref 70–99)
Glucose-Capillary: 264 mg/dL — ABNORMAL HIGH (ref 70–99)
Glucose-Capillary: 319 mg/dL — ABNORMAL HIGH (ref 70–99)
Glucose-Capillary: 378 mg/dL — ABNORMAL HIGH (ref 70–99)

## 2024-06-18 LAB — MAGNESIUM: Magnesium: 1.7 mg/dL (ref 1.7–2.4)

## 2024-06-18 LAB — PHOSPHORUS: Phosphorus: 2.3 mg/dL — ABNORMAL LOW (ref 2.5–4.6)

## 2024-06-18 MED ORDER — CLONAZEPAM 0.5 MG PO TABS
0.5000 mg | ORAL_TABLET | Freq: Every day | ORAL | Status: DC
  Administered 2024-06-18 – 2024-06-29 (×12): 0.5 mg via ORAL
  Filled 2024-06-18 (×12): qty 1

## 2024-06-18 MED ORDER — INSULIN GLARGINE-YFGN 100 UNIT/ML ~~LOC~~ SOLN
15.0000 [IU] | Freq: Every day | SUBCUTANEOUS | Status: DC
  Administered 2024-06-18 – 2024-06-19 (×2): 15 [IU] via SUBCUTANEOUS
  Filled 2024-06-18 (×2): qty 0.15

## 2024-06-18 MED ORDER — METOPROLOL TARTRATE 25 MG PO TABS
25.0000 mg | ORAL_TABLET | Freq: Two times a day (BID) | ORAL | Status: DC
  Administered 2024-06-18 – 2024-06-19 (×3): 25 mg via ORAL
  Filled 2024-06-18 (×3): qty 1

## 2024-06-18 MED ORDER — IRBESARTAN 150 MG PO TABS
300.0000 mg | ORAL_TABLET | Freq: Every day | ORAL | Status: DC
Start: 2024-06-18 — End: 2024-06-29
  Administered 2024-06-18 – 2024-06-29 (×12): 300 mg via ORAL
  Filled 2024-06-18 (×3): qty 2
  Filled 2024-06-18: qty 4
  Filled 2024-06-18 (×8): qty 2

## 2024-06-18 MED ORDER — INSULIN ASPART 100 UNIT/ML IJ SOLN
0.0000 [IU] | INTRAMUSCULAR | Status: DC
  Administered 2024-06-18: 1 [IU] via SUBCUTANEOUS
  Administered 2024-06-18: 5 [IU] via SUBCUTANEOUS
  Administered 2024-06-18: 4 [IU] via SUBCUTANEOUS
  Administered 2024-06-19 (×2): 3 [IU] via SUBCUTANEOUS

## 2024-06-18 NOTE — Consult Note (Addendum)
 Consultation Note Date: 06/18/2024   Patient Name: Kristen Bowman  DOB: 09-Dec-1930  MRN: [id number]  Age / Sex: 89 y.o., female  PCP: Valentin Skates, DO Referring Physician: Al-Sultani, Anmar, MD  Reason for Consultation: Establishing goals of care,  DNR/DNI pt with worsening respiratory status d/t aspiration iso DKA  HPI/Patient Profile: 89 y.o. female  with past medical history of diabetes mellitus type 2, hypertension, history of E. coli UTI and associated encephalopathy, right bundle branch block, chronic anemia, chronic kidney disease stage II, anemia,  dementia, depression was admitted on 06/15/2024 with AMS secondary to metabolic encephalopathy, DKA/DM II, AKI on CKD II/dehydration, sepsis secondary to aspiration pneumonia and UTI, acute hypoxic respiratory failure.   Of note, patient has had 2 admissions and 1 ED visit in the last 6 months.  Clinical Assessment and Goals of Care: I have reviewed medical records including EPIC notes, labs, advanced directives including DNR on chart. Received report from primary RN - no acute concerns.   Went to visit patient at bedside - granddaughter/Lisa present. Patient was lying in bed intermittently awake/asleep - she does not interact with me during my visit. No signs or non-verbal gestures of pain or discomfort noted. No respiratory distress, increased work of breathing, or secretions noted. She is ill and frail appearing.  Met with granddaughter to discuss diagnosis, prognosis, GOC, EOL wishes, disposition, and options.  I introduced Palliative Medicine as specialized medical care for people living with serious illness. It focuses on providing relief from the symptoms and stress of a serious illness. The goal is to improve quality of life for both the patient and the family.  We discussed a brief life review of the patient as well as functional and nutritional  status. Patient was living at her own home, able to ambulate with walker, and daughter/Carol lived with her as primary caregiver. [name] confirms patient was enrolled with [facility]. Directly prior to admission, patient was at [facility] for hospice respite care. Unfortunately, patient did not receive her insulin  while there and developed DKA.   Therapeutic listening provided as [name] reflects on frustrations with [employer]. Family feel guilty and would not want patient passing away because they needed a break. She has a clear understanding of patient's current acute medical situation.  Goals are clear to treat the treatable while admitted. They would like patient discharged back home with her daughter and continue hospice care with [facility].  Called daughter/Carol - emotional support provided. Therapeutic listening provided as she reflects on events that lead to patient's admission. She also has a clear understanding of patient's current acute medical situation. She confirms goals are the same as above. She feels comfortable with patient returning back home with hospice support. We did discuss that patient may or may not return to previous baseline.  She appreciates PMT following for support and ongoing discussions as needed.   Discussed with family the importance of continued conversation with each other and the medical providers regarding overall plan of care and treatment options, ensuring decisions are within the context of the patient's values and GOCs.    Questions and concerns were addressed. The patient/family was encouraged to call with questions and/or concerns. PMT card was provided.   Primary Decision Maker: NEXT OF KIN - daughter/Carol Arlean Abu    SUMMARY OF RECOMMENDATIONS   Continue to treat the treatable Continue DNR-Limited Once medically optimized, goal is for patient's return home with hospice. Daughter/Carol provides patient with 24/7 support PMT will  continue to follow and support holistically   Code Status/Advance Care Planning: DNR  Palliative Prophylaxis:  Aspiration, Delirium Protocol, Frequent Pain Assessment, Oral Care, and Turn Reposition  Additional Recommendations (Limitations, Scope, Preferences): No Artificial Feeding and No Tracheostomy  Psycho-social/Spiritual:  Desire for further Chaplaincy support:no Created space and opportunity for patient and family to express thoughts and feelings regarding patient's current medical situation.  Emotional support and therapeutic listening provided.  Prognosis:  < 6 months  Discharge Planning: Home with Hospice      Primary Diagnoses: Present on Admission:  AMS (altered mental status)   I have reviewed the medical record, interviewed the patient and family, and examined the patient. The following aspects are pertinent.  Past Medical History:  Diagnosis Date   Anemia    Anxiety states    Diabetes mellitus    T2DM with renal manifestations   Generalized osteoarthrosis, unspecified site    Hematuria    Hyperlipidemia    mixed   Hypertension    Irritable bowel syndrome    Other specified cardiac dysrhythmias(427.89)    atrial fibrilation   Panic disorder    Polymyalgia rheumatica    Proteinuria 05/29/10   Type II or unspecified type diabetes mellitus with renal manifestations, uncontrolled(250.42) 05/29/10   CKD II (mild)   Social History   Socioeconomic History   Marital status: Divorced    Spouse name: Not on file   Number of children: Not on file   Years of education: Not on file   Highest education level: Not on file  Occupational History   Not on file  Tobacco Use   Smoking status: Never   Smokeless tobacco: Never  Vaping Use   Vaping status: Never Used  Substance and Sexual Activity   Alcohol use: No   Drug use: No   Sexual activity: Not on file  Other Topics Concern   Not on file  Social History Narrative   Right handed    Lives at  [facility]   With daughter today, turns 38 this year      06/16/2024  Per daughter they live together   Social Drivers of Health   Financial Resource Strain: Not on file  Food Insecurity: No Food Insecurity (06/16/2024)   Hunger Vital Sign    Worried About Running Out of Food in the Last Year: Never true    Ran Out of Food in the Last Year: Never true  Transportation Needs: No Transportation Needs (06/16/2024)   PRAPARE - Administrator, Civil Service (Medical): No    Lack of Transportation (Non-Medical): No  Physical Activity: Not on file  Stress: Not on file  Social Connections: Socially Isolated (06/16/2024)   Social Connection and Isolation Panel    Frequency of Communication with Friends and Family: Three times a week    Frequency of Social Gatherings with Friends and Family: Three times a week    Attends Religious Services: Never    Active Member of Clubs or Organizations: No    Attends Banker Meetings: Never    Marital Status: Divorced   Family History  Problem Relation Age of Onset   Diabetes Sister    Cancer Other    Heart attack Other    Emphysema Mother    Leukemia Father    Scheduled Meds:  clonazePAM   0.5 mg Oral Daily   heparin  injection (subcutaneous)  5,000 Units Subcutaneous Q8H   insulin  aspart  0-6 Units Subcutaneous Q4H  insulin  glargine-yfgn  15 Units Subcutaneous Daily   irbesartan  300 mg Oral Daily   metoprolol  tartrate  25 mg Oral BID   nystatin  5 mL Oral QID   Continuous Infusions:  meropenem (MERREM) IV 1 g (06/18/24 1219)   PRN Meds:.acetaminophen , dextrose , hydrALAZINE , labetalol  Medications Prior to Admission:  Prior to Admission medications   Medication Sig Start Date End Date Taking? Authorizing Provider  amLODipine  (NORVASC ) 10 MG tablet Take 1 tablet (10 mg total) by mouth daily. 04/01/24 06/17/24 Yes Christobal Guadalajara, MD  aspirin  EC 325 MG tablet Take 325 mg by mouth daily.   Yes [provider]  clonazePAM  (KLONOPIN ) 0.5 MG tablet Take 0.5 mg by mouth daily.   Yes [provider]  divalproex  (DEPAKOTE  SPRINKLES) 125 MG capsule Take 2 capsules (250 mg total) by mouth 2 (two) times daily. 02/15/24  Yes Wertman, Sara E, PA-C  donepezil  (ARICEPT ) 5 MG tablet Take 1 tablet (5 mg total) by mouth at bedtime. 03/05/24  Yes Wertman, Sara E, PA-C  melatonin 3 MG TABS tablet Take 3 mg by mouth at bedtime.   Yes [provider]  metFORMIN  (GLUCOPHAGE ) 500 MG tablet Take 750 mg by mouth daily with breakfast.   Yes [provider]  metoprolol  tartrate (LOPRESSOR ) 25 MG tablet Take 75 mg by mouth 2 (two) times daily.   Yes [provider]  risperiDONE  (RISPERDAL ) 0.5 MG tablet Take 0.5 mg by mouth at bedtime.   Yes [provider]  sertraline  (ZOLOFT ) 50 MG tablet Take 50 mg by mouth daily.   Yes [provider]  traZODone  (DESYREL ) 50 MG tablet Take 50 mg by mouth at bedtime.   Yes [provider]  atorvastatin  (LIPITOR) 40 MG tablet Take 40 mg by mouth daily. Patient not taking: Reported on 06/17/2024    [provider]  olmesartan (BENICAR) 40 MG tablet Take 40 mg by mouth daily. Patient not taking: Reported on 06/17/2024 04/25/24   [provider]   Allergies  Allergen Reactions   Ace Inhibitors Shortness Of Breath and Swelling   Tape Other (See Comments)    Skin bruises and tears easily   Review of Systems  Unable to perform ROS: Mental status change    Physical Exam Vitals and nursing note reviewed.  Constitutional:      General: She is not in acute distress.    Appearance: She is ill-appearing.  Pulmonary:     Effort: No respiratory distress.  Skin:    General: Skin is warm and dry.  Neurological:     Comments: sleepy     Vital Signs: BP (!) 167/64 (BP Location: Left Arm)   Pulse (!) 124   Temp 98.7 F (37.1 C) (Axillary)   Resp (!) 24   Ht 5' 6 (1.676 m)   Wt 62.8 kg   SpO2  94%   BMI 22.35 kg/m  Pain Scale: 0-10   Pain Score: 0-No pain   SpO2: SpO2: 94 % O2 Device:SpO2: 94 % O2 Flow Rate: .O2 Flow Rate (L/min): 2 L/min  IO: Intake/output summary:  Intake/Output Summary (Last 24 hours) at 06/18/2024 1605 Last data filed at 06/18/2024 0400 Gross per 24 hour  Intake 1848.85 ml  Output --  Net 1848.85 ml    LBM: Last BM Date :  (unknown, patient unable to report) Baseline Weight: Weight: 74.8 kg Most recent weight: Weight: 62.8 kg     Palliative Assessment/Data: PPS 30%     Time In:  1230 Time Out: 1345 Time Total: 75 minutes  Signed by: Jeoffrey CHRISTELLA Sharps, NP   Please contact Palliative Medicine Team phone at [phone] for questions and concerns.  For individual provider: See Amion  *Portions of this note are a verbal dictation therefore any spelling and/or grammatical errors are due to the Dragon Medical One system interpretation.

## 2024-06-18 NOTE — Progress Notes (Signed)
 Speech Language Pathology Treatment: Dysphagia  Patient Details Name: Kristen Bowman MRN: [id number] DOB: August 03, 1931 Today's Date: 06/18/2024 Time: 8964-8896 SLP Time Calculation (min) (ACUTE ONLY): 28 min  Assessment / Plan / Recommendation Clinical Impression  Patient seen for f/u diagnostic treatment with goal to resume a po diet. Patient alert upon arrival to room. Oral care provided to maximize safety with po intake and facilitate hydration of oral mucosa as oral cavity noted to be very dry. Patient able to consume clinician provided diagnostic po trials with moderate physical assistance for self feeding due to generalized upper extremity weakness. S/s of aspiration characterized by both immediate and delayed cough post swallow continue to be present with thin liquids via both cup and straw sips. Otherwise, patient with seemingly intact airway protection across additional po trials. Mastication prolonged with dysphagia 3 solids, improved with dysphagia 2 and pureed boluses. Per discussion with daughter via phone, patient does wear dentures for all po intake and was being given pills crushed in puree due to oral holding. Daughter to bring dentures. Will advance diet to dysphagia 2 with nectar thick liquids. Per daughter, no additional difficulty swallowing prior to admission. Potential aspiration PNA diagnosis along with advanced age and dementia does raise concern for baseline dysphagia however it also was reported that patient possibly vomited prior to admission which could have led to aspiration event and ultimately the development of PNA. Will f/u closely in order to determine potential to advance diet at bedside vs need for instrumental assessment to evaluate swallowing physiology more closely. Discussed with daughter, RN and MD.    HPI HPI: Patient is a 89 y.o. female with past medical history of diabetes mellitus type 2, hypertension, history of E. Coli UTI and associated encephalopahty, right  bundle branch block, chronic anemia, chronic kidney disease stage II, anemia, dementia, depression independent and oriented at baseline come from AMS and hyperglycemia. Patient was receiving respite care at a SNF and was admitted to the hospital on 10/21 secondary to AMS hyperglycemia (daughter suspects patient was not being given her insulin ) and altered mental status and UA was positive for UTI. Also with PNA, suspected aspiration. Per RN note from 10/22, during oral care, suspected emesis in oral cavity and around dentures. Bedside swallow evaluation ordered to assess patient's swallow.      SLP Plan  Goals updated          Recommendations  Diet recommendations: Dysphagia 2 (fine chop);Nectar-thick liquid Liquids provided via: Cup;Straw Medication Administration: Crushed with puree Supervision: Staff to assist with self feeding;Full supervision/cueing for compensatory strategies Compensations: Slow rate;Small sips/bites Postural Changes and/or Swallow Maneuvers: Seated upright 90 degrees                  Oral care BID   Frequent or constant Supervision/Assistance Dysphagia, oropharyngeal phase (R13.12)     Goals updated    Kristen Bowman, Kristen Bowman  Frandy Basnett Meryl  06/18/2024, 11:17 AM

## 2024-06-18 NOTE — Inpatient Diabetes Management (Signed)
 Inpatient Diabetes Program Recommendations  AACE/ADA: New Consensus Statement on Inpatient Glycemic Control (2015)  Target Ranges:  Prepandial:   less than 140 mg/dL      Peak postprandial:   less than 180 mg/dL (1-2 hours)      Critically ill patients:  140 - 180 mg/dL   Lab Results  Component Value Date   GLUCAP 163 (H) 06/18/2024   HGBA1C 9.4 (H) 06/15/2024    Review of Glycemic Control  Diabetes history: type 1 Outpatient Diabetes medications: Lantus  20 units at HS, Humalog  3-8 units scale TID, Metformin  750 mg daily Current orders for Inpatient glycemic control: IV insulin   Inpatient Diabetes Program Recommendations:   When transitioning off the IV insulin  drip, recommend: Semglee  18 units daily Novolog  0-6 units every 4 hours  Will continue to monitor blood sugars while in the hospital.  Marjorie Lunger RN BSN CDE Diabetes Coordinator Pager: [phone]  8am-5pm

## 2024-06-18 NOTE — Evaluation (Signed)
 Physical Therapy Evaluation Patient Details Name: Kristen Bowman MRN: [id number] DOB: 02-01-1931 Today's Date: 06/18/2024  History of Present Illness  The pt is a 89yo female presenting 06/15/24 with AMS and lethargy. Pt + for DKA, AKI, possible UTI and acute hypoxic respiratory failure likely from aspiration.  PMH includes: dementia, anemia, DM II, osteoarthritis, HLD, CKD II HTN, IBS, and afib.  Clinical Impression  Pt admitted with/for several problems stated above.  Today's session was limited overall with little information gathered and limited mobility due to fatigue and I suspect fears.  Pt needing max assist for basic mobility. .  Pt currently limited functionally due to the problems listed. ( See problems list.)   Pt will benefit from PT to maximize function and safety in order to get ready for next venue listed below.         If plan is discharge home, recommend the following: A lot of help with walking and/or transfers;A lot of help with bathing/dressing/bathroom;Assistance with cooking/housework   Can travel by private vehicle   No    Equipment Recommendations Other (comment) (TBD next venue)  Recommendations for Other Services       Functional Status Assessment Patient has had a recent decline in their functional status and demonstrates the ability to make significant improvements in function in a reasonable and predictable amount of time.     Precautions / Restrictions Precautions Precautions: Fall Recall of Precautions/Restrictions: Impaired Restrictions Weight Bearing Restrictions Per Provider Order: No      Mobility  Bed Mobility Overal bed mobility: Needs Assistance Bed Mobility: Supine to Sit, Sit to Supine, Rolling Rolling: Max assist   Supine to sit: Total assist Sit to supine: Max assist   General bed mobility comments: assist through all facets of the transition to /from EOB to supine.    Transfers                   General transfer  comment: deferred, pt with zero sitting balance, consistently falling posteriorly    Ambulation/Gait                  Stairs            Wheelchair Mobility     Tilt Bed    Modified Rankin (Stroke Patients Only)       Balance Overall balance assessment: Needs assistance Sitting-balance support: Feet supported, Bilateral upper extremity supported Sitting balance-Leahy Scale: Zero   Postural control: Posterior lean, Left lateral lean                                   Pertinent Vitals/Pain Pain Assessment Pain Assessment: Faces Faces Pain Scale: No hurt Pain Intervention(s): Monitored during session    Home Living Family/patient expects to be discharged to:: Private residence Living Arrangements: Children;Other relatives   Type of Home: House             Additional Comments: pt reports living in a house, unable to relay any other information    Prior Function Prior Level of Function : Needs assist             Mobility Comments: reports walking with a walker very little and using a w/c ADLs Comments: reports assist for bathing, dressing and all IADLs     Extremity/Trunk Assessment   Upper Extremity Assessment Upper Extremity Assessment: Generalized weakness    Lower Extremity Assessment Lower Extremity Assessment:  Generalized weakness    Cervical / Trunk Assessment Cervical / Trunk Assessment: Other exceptions;Kyphotic (weakness)  Communication   Communication Communication: Impaired Factors Affecting Communication: Difficulty expressing self    Cognition Arousal: Alert Behavior During Therapy: WFL for tasks assessed/performed   PT - Cognitive impairments: No family/caregiver present to determine baseline, Initiation, Problem solving, Safety/Judgement                         Following commands: Impaired Following commands impaired: Follows one step commands with increased time     Cueing Cueing  Techniques: Verbal cues, Gestural cues     General Comments General comments (skin integrity, edema, etc.): VSS in general    Exercises Other Exercises Other Exercises: warm up ROM to bil LE's with graded assist /resistance.   Assessment/Plan    PT Assessment Patient needs continued PT services  PT Problem List Decreased strength;Decreased activity tolerance;Decreased balance;Decreased mobility;Decreased coordination       PT Treatment Interventions Gait training;Functional mobility training;Therapeutic activities;Therapeutic exercise;Balance training;Patient/family education    PT Goals (Current goals can be found in the Care Plan section)  Acute Rehab PT Goals Patient Stated Goal: pt unable to help set goals Time For Goal Achievement: 07/02/24 Potential to Achieve Goals: Fair    Frequency Min 2X/week     Co-evaluation               AM-PAC PT 6 Clicks Mobility  Outcome Measure Help needed turning from your back to your side while in a flat bed without using bedrails?: A Lot Help needed moving from lying on your back to sitting on the side of a flat bed without using bedrails?: A Lot Help needed moving to and from a bed to a chair (including a wheelchair)?: A Lot Help needed standing up from a chair using your arms (e.g., wheelchair or bedside chair)?: A Lot Help needed to walk in hospital room?: A Lot Help needed climbing 3-5 steps with a railing? : A Lot 6 Click Score: 12    End of Session Equipment Utilized During Treatment: Oxygen Activity Tolerance: Patient limited by fatigue;Patient tolerated treatment well Patient left: in bed;with call bell/phone within reach;with bed alarm set;with nursing/sitter in room Nurse Communication: Mobility status PT Visit Diagnosis: Other abnormalities of gait and mobility (R26.89);Muscle weakness (generalized) (M62.81)    Time: 8466-8443 PT Time Calculation (min) (ACUTE ONLY): 23 min   Charges:   PT Evaluation $PT Eval  Moderate Complexity: 1 Mod PT Treatments $Therapeutic Activity: 8-22 mins PT General Charges $$ ACUTE PT VISIT: 1 Visit    06/18/2024  Kristen HERO., PT Acute Rehabilitation Services [phone]  (office)     .  Kristen Bowman 06/18/2024, 4:28 PM

## 2024-06-18 NOTE — Progress Notes (Signed)
 Progress Note   Patient: Kristen Bowman FMW:[id number] DOB: 02/22/1931 DOA: 06/15/2024     3 DOS: the patient was seen and examined on 06/18/2024   Assessment and Plan:  # Acute hypoxic respite failure - improving - Presented with SpO2 of 88% on RA with tachypnea in the 20s - Likely secondary to aspiration pneumonia - O2 requirement is improving significantly, down to 2 L/min - Continue to wean O2 as tolerated  # Sepsis  - Presented with leukocytosis to 12.6, tachypnea 27, lactic acid elevated to 3.4 with AMS and AKI, with concern for underlying aspiration pneumonia and UTI - Management of underlying etiology as noted below  # Possible aspiration pneumonia - Concern for aspiration pneumonia given encephalopathy with findings concerning for old emesis by RN on oral care  - CXR (10/22) showing new confluent new retrocardiac opacity on the left compatible with new or increased left lower lung collapse or consolidation - RPP negative - Procalcitonin 0.21 on 10/22 - Febrile to 100.4 F overnight - Leukocytosis resolved with WBC improving to 9.8 - Blood cultures (10/21) NGTD - Vanc discontinued after negative MRSA PCR - Continue meropenem  - Aspiration precautions - SLP evaluated patient today - recommended dysphagia 2 diet with nectar thick liquids and meds crushed with pure.  # DKA - resolved - Presented with hyperglycemia to 770s in the setting of not receiving insulin  at SNF during 5-day stay. BhB elevated to > 8 on admission - Was started on insulin  drip - BhB wnl - AG remains closed this morning - Patient's mental status improved significantly, able to communicate and follow commands. SLP cleared for PO intake, will transition of insulin  drip after starting Semglee  18 units - Diabetes coordinator following - Semglee  15 units now then daily and Novolog  0-6 units q4h  #Lactic acidosis - resolved - Episodes of recurrent lactic acidosis, possibly secondary to aspiration pneumonia  and dehydration - Resolved with IVFs  # High anion gap metabolic acidosis - resolved - Likely secondary to lactic acidosis and DKA - Continue management of underlying causes as above  # Possible UTI - UA concerning for UTI. Patient encephalopathic and unable to reliably communicate symptoms - History of ESBL E. Coli - Urine culture showing greater than 100,000 colonies of multiple species and suggested recollection.  However, patient receiving antibiotics with clinical improvement, so repeat UA/UCx futile at this point - Continue meropenem  # Hypernatremia - resolved - Elevated Na in the setting of dehydration and osmotic diuresis from DKA - Na 142  # Hypokalemia - resolved  #Hypertension - Patient with elevated BP to 170-190s the majority of the day - PRN hydralazine  20 mg q6h and PRN Labetalol  5 mg q2h  - Home antihypertensive regimen includes amlodipine  5 mg daily, olmesartan 40 mg daily, metoprolol  tartrate 50 mg twice daily - Started irbesartan 300 mg (home med alternative) and metoprolol  25 mg BID  #Acute metabolic encephalopathy - improving - Likely secondary to acute illness caused by aspiration pneumonia in the setting of underlying dementia - Continue treating underlying causes as above - Delirium precautions  #AKI on CKD stage IIIa - resolved - Creatinine elevated 2.74 on admission, baseline around 0.9 - Likely prerenal in the setting of dehydration from vomiting and osmotic diuresis - Improved with IVFs - Cr back at baseline  #Elevated troponins - High-sensitivity troponins peaked at 192 - Likely secondary to acute illness, aspiration pneumonia, DKA  - Less likely ACS - Echo showed LVEF 70 to 75% with no regional  wall motion normalities.  Noted severe asymmetric left ventricular hypertrophy of the basal septal segment - Cardiology evaluated - indicated unlikely ACS if no abnormalities on echo - Heparin  drip discontinued on  10/22  #Dementia #Anxiety #Depression - Home meds include sertraline  50 mg daily, Klonopin  0.5 mg daily, Depakote  125 mg twice daily, donepezil  5 mg daily, trazodone  50 mg nightly, risperidone  0.5 mg nightly. Initially held due to AMS and NPO. Will resume cautiously. - Resumed home Klonopin  0.5 mg daily  Subjective: Patient seen bedside.  Patient's granddaughter was at bedside as well. She is significantly more awake and alert today.  Was having lunch with one-on-one feeding after being cleared by SLP.  Answering questions and communicating appropriately.   Denies chest pain, nausea/vomiting, abdominal pain, fevers, chills.   Physical Exam: Vitals:   06/17/24 2338 06/18/24 0130 06/18/24 0345 06/18/24 0734  BP: (!) 163/73 (!) 172/67 (!) 149/59 (!) 152/71  Pulse: (!) 101  (!) 108 (!) 114  Resp: 14  20 (!) 24  Temp: 98.9 F (37.2 C)  98.9 F (37.2 C) 98.6 F (37 C)  TempSrc: Oral  Axillary Axillary  SpO2: 97%  95% 96%  Weight:      Height:       Physical Exam Constitutional:      General: She is not in acute distress.    Appearance: She is ill-appearing.     Interventions: Face mask in place.  Cardiovascular:     Rate and Rhythm: Regular rhythm. Tachycardia present.     Heart sounds: Normal heart sounds. No murmur heard. Pulmonary:     Effort: Pulmonary effort is normal. No respiratory distress.     Breath sounds: Decreased breath sounds present. No wheezing.  Abdominal:     General: There is no distension.     Palpations: Abdomen is soft.     Tenderness: There is no abdominal tenderness.  Musculoskeletal:     Right lower leg: No edema.     Left lower leg: No edema.  Skin:    General: Skin is warm and dry.     Capillary Refill: Capillary refill takes less than 2 seconds.  Neurological:     Mental Status: She is alert. Mental status is at baseline.     Family Communication: Spoke to patient's daughter Kristen Bowman on the phone.  Updated patient's granddaughter Kristen Bowman at  bedside.  Disposition: Status is: Inpatient Remains inpatient appropriate because: Treatment of sepsis secondary to aspiration pneumonia and UTI with IV antibiotics, transitioning off insulin  drip now the patient is eating.  Planned Discharge Destination: TBD  DVT prophylaxis: heparin  injection 5,000 Units Start: 06/16/24 2200   Time spent: 45 minutes  Author: Duffy Larch, MD 06/18/2024 11:25 AM  For on call review [email or web address].

## 2024-06-18 NOTE — Plan of Care (Signed)

## 2024-06-18 NOTE — TOC Progression Note (Addendum)
 Transition of Care [facility]) - Progression Note    Patient Details  Name: Kristen Bowman MRN: [id number] Date of Birth: [birth year]  Transition of Care [facility]) CM/SW Contact  Graves-Bigelow, Erminio Deems, RN Phone Number: 06/18/2024, 2:30 PM  Clinical Narrative:  Inpatient Case Manager received a secure chat from palliative care regarding disposition needs for the patient. ICM spoke with daughter and the plan will be for home hospice with [facility] in [place] [phone]. Patient has DME: hospital bed, over bed table. bedside commode, and shower chair. ICM will continue to follow for additional disposition needs. Daughter is thinking about mode of transportation home. Patient may need PTAR; please reach out to daughter at discharge to discuss transportation.    [facility] Weekend Coverage: Derrek [phone]  Expected Discharge Plan: Home w Hospice Care Barriers to Discharge: Continued Medical Work up  Expected Discharge Plan and Services In-house Referral: NA Discharge Planning Services: CM Consult Post Acute Care Choice: Hospice, Resumption of Svcs/PTA Provider Living arrangements for the past 2 months: Single Family Home                   Social Drivers of Health (SDOH) Interventions SDOH Screenings   Food Insecurity: No Food Insecurity (06/16/2024)  Housing: Low Risk  (06/16/2024)  Transportation Needs: No Transportation Needs (06/16/2024)  Utilities: Not At Risk (06/16/2024)  Social Connections: Socially Isolated (06/16/2024)  Tobacco Use: Low Risk  (06/16/2024)    Readmission Risk Interventions     No data to display

## 2024-06-18 NOTE — Evaluation (Signed)
 Occupational Therapy Evaluation Patient Details Name: Kristen Bowman MRN: [id number] DOB: [birth year] Today's Date: 06/18/2024   History of Present Illness   The pt is a 89yo female presenting 06/15/24 with AMS and lethargy. Pt + for DKA, AKI, possible UTI and acute hypoxic respiratory failure likely from aspiration.  PMH includes: dementia, anemia, DM II, osteoarthritis, HLD, CKD II HTN, IBS, and afib.     Clinical Impressions Pt is a poor historian, call to daughter went unanswered. Pt reports walking very little with RW and having a walker at home. She reports being able to self feed and is dependent in bathing, dressing and all IADLs. Pt currently requires max to total assist for bed mobility and demonstrates zero sitting balance with posterior and L lean. Pt is NPO, she needs max to total assist for ADLs. Patient will benefit from continued inpatient follow up therapy, <3 hours/day.     If plan is discharge home, recommend the following:   Two people to help with walking and/or transfers;A lot of help with bathing/dressing/bathroom;Assistance with cooking/housework;Assistance with feeding;Direct supervision/assist for medications management;Direct supervision/assist for financial management;Assist for transportation;Help with stairs or ramp for entrance     Functional Status Assessment   Patient has had a recent decline in their functional status and demonstrates the ability to make significant improvements in function in a reasonable and predictable amount of time.     Equipment Recommendations   [employer];Hospital bed     Recommendations for Other Services         Precautions/Restrictions   Precautions Precautions: Fall Recall of Precautions/Restrictions: Impaired Restrictions Weight Bearing Restrictions Per Provider Order: No     Mobility Bed Mobility Overal bed mobility: Needs Assistance Bed Mobility: Supine to Sit, Sit to Supine, Rolling Rolling: Total  assist   Supine to sit: Max assist Sit to supine: Max assist   General bed mobility comments: assist for all aspects    Transfers                   General transfer comment: deferred, pt with zero sitting balance      Balance Overall balance assessment: Needs assistance Sitting-balance support: Feet supported, Bilateral upper extremity supported Sitting balance-Leahy Scale: Zero   Postural control: Posterior lean, Left lateral lean                                 ADL either performed or assessed with clinical judgement   ADL Overall ADL's : Needs assistance/impaired Eating/Feeding: NPO   Grooming: Brushing hair;Sitting;Total assistance   Upper Body Bathing: Total assistance;Sitting   Lower Body Bathing: Total assistance;Bed level   Upper Body Dressing : Maximal assistance;Sitting   Lower Body Dressing: Total assistance;Bed level       Toileting- Clothing Manipulation and Hygiene: Total assistance;Bed level               Vision Baseline Vision/History: 1 Wears glasses Ability to See in Adequate Light: 0 Adequate Patient Visual Report: No change from baseline Additional Comments: reports wearing readers     Perception         Praxis         Pertinent Vitals/Pain Pain Assessment Pain Assessment: No/denies pain     Extremity/Trunk Assessment Upper Extremity Assessment Upper Extremity Assessment: Generalized weakness;Right hand dominant   Lower Extremity Assessment Lower Extremity Assessment: Defer to PT evaluation   Cervical / Trunk Assessment Cervical / Trunk  Assessment: Other exceptions (weakness)   Communication Communication Communication: Impaired Factors Affecting Communication: Difficulty expressing self   Cognition Arousal: Alert Behavior During Therapy: WFL for tasks assessed/performed Cognition: No family/caregiver present to determine baseline             OT - Cognition Comments: impaired memory, poor  historian                 Following commands: Impaired Following commands impaired: Follows one step commands with increased time     Cueing  General Comments   Cueing Techniques: Verbal cues;Gestural cues      Exercises     Shoulder Instructions      Home Living Family/patient expects to be discharged to:: Private residence Living Arrangements: Children;Other relatives   Type of Home: House                           Additional Comments: pt reports living in a house, unable to relay any other information      Prior Functioning/Environment Prior Level of Function : Needs assist             Mobility Comments: reports walking with a walker very little and using a w/c ADLs Comments: reports assist for bathing, dressing and all IADLs    OT Problem List: Decreased strength;Impaired balance (sitting and/or standing);Decreased coordination;Decreased cognition;Decreased safety awareness;Impaired UE functional use;Cardiopulmonary status limiting activity   OT Treatment/Interventions: Self-care/ADL training;Therapeutic activities;Patient/family education;Balance training;Therapeutic exercise      OT Goals(Current goals can be found in the care plan section)   Acute Rehab OT Goals OT Goal Formulation: Patient unable to participate in goal setting Time For Goal Achievement: 07/02/24 Potential to Achieve Goals: Fair ADL Goals Pt Will Perform Eating: with min assist;sitting Pt Will Perform Grooming: with min assist;sitting;bed level Pt/caregiver will Perform Home Exercise Program: Increased strength;Both right and left upper extremity;With minimal assist Additional ADL Goal #1: Pt will demonstrate fair sitting balance x 10 min in preparation for ADLs. Additional ADL Goal #2: Pt will roll with min assist for positioning and pericare.   OT Frequency:  Min 2X/week    Co-evaluation              AM-PAC OT 6 Clicks Daily Activity     Outcome  Measure Help from another person eating meals?: A Lot Help from another person taking care of personal grooming?: A Lot Help from another person toileting, which includes using toliet, bedpan, or urinal?: Total Help from another person bathing (including washing, rinsing, drying)?: Total Help from another person to put on and taking off regular upper body clothing?: A Lot Help from another person to put on and taking off regular lower body clothing?: Total 6 Click Score: 9   End of Session Equipment Utilized During Treatment: Oxygen  Activity Tolerance: Patient tolerated treatment well Patient left: in bed;with call bell/phone within reach;with bed alarm set;with nursing/sitter in room  OT Visit Diagnosis: Muscle weakness (generalized) (M62.81);Other symptoms and signs involving cognitive function                Time: 9163-9140 OT Time Calculation (min): 23 min Charges:  OT General Charges $OT Visit: 1 Visit OT Evaluation $OT Eval Moderate Complexity: 1 Mod OT Treatments $Therapeutic Activity: 8-22 mins  Mliss HERO, OTR/L Acute Rehabilitation Services Office: [phone]  Kennth Mliss Helling 06/18/2024, 10:41 AM

## 2024-06-19 DIAGNOSIS — E1110 Type 2 diabetes mellitus with ketoacidosis without coma: Secondary | ICD-10-CM

## 2024-06-19 DIAGNOSIS — R4182 Altered mental status, unspecified: Secondary | ICD-10-CM

## 2024-06-19 DIAGNOSIS — N179 Acute kidney failure, unspecified: Secondary | ICD-10-CM | POA: Diagnosis not present

## 2024-06-19 DIAGNOSIS — J690 Pneumonitis due to inhalation of food and vomit: Secondary | ICD-10-CM | POA: Diagnosis not present

## 2024-06-19 LAB — GLUCOSE, CAPILLARY
Glucose-Capillary: 163 mg/dL — ABNORMAL HIGH (ref 70–99)
Glucose-Capillary: 203 mg/dL — ABNORMAL HIGH (ref 70–99)
Glucose-Capillary: 252 mg/dL — ABNORMAL HIGH (ref 70–99)
Glucose-Capillary: 272 mg/dL — ABNORMAL HIGH (ref 70–99)
Glucose-Capillary: 283 mg/dL — ABNORMAL HIGH (ref 70–99)
Glucose-Capillary: 285 mg/dL — ABNORMAL HIGH (ref 70–99)

## 2024-06-19 LAB — BASIC METABOLIC PANEL WITH GFR
Anion gap: 12 (ref 5–15)
BUN: 38 mg/dL — ABNORMAL HIGH (ref 8–23)
CO2: 22 mmol/L (ref 22–32)
Calcium: 8.5 mg/dL — ABNORMAL LOW (ref 8.9–10.3)
Chloride: 108 mmol/L (ref 98–111)
Creatinine, Ser: 1.08 mg/dL — ABNORMAL HIGH (ref 0.44–1.00)
GFR, Estimated: 48 mL/min — ABNORMAL LOW (ref >60)
Glucose, Bld: 306 mg/dL — ABNORMAL HIGH (ref 70–99)
Potassium: 4.1 mmol/L (ref 3.5–5.1)
Sodium: 142 mmol/L (ref 135–145)

## 2024-06-19 LAB — CBC
HCT: 37.5 % (ref 36.0–46.0)
Hemoglobin: 11.8 g/dL — ABNORMAL LOW (ref 12.0–15.0)
MCH: 27.8 pg (ref 26.0–34.0)
MCHC: 31.5 g/dL (ref 30.0–36.0)
MCV: 88.2 fL (ref 80.0–100.0)
Platelets: 167 K/uL (ref 150–400)
RBC: 4.25 MIL/uL (ref 3.87–5.11)
RDW: 16.2 % — ABNORMAL HIGH (ref 11.5–15.5)
WBC: 10.4 K/uL (ref 4.0–10.5)
nRBC: 0 % (ref 0.0–0.2)

## 2024-06-19 MED ORDER — INSULIN ASPART 100 UNIT/ML IJ SOLN
3.0000 [IU] | Freq: Three times a day (TID) | INTRAMUSCULAR | Status: DC
  Administered 2024-06-20 – 2024-06-22 (×6): 3 [IU] via SUBCUTANEOUS
  Filled 2024-06-19: qty 0.03

## 2024-06-19 MED ORDER — SODIUM CHLORIDE 0.9 % IV SOLN: INTRAVENOUS | Status: AC | PRN

## 2024-06-19 MED ORDER — INSULIN ASPART 100 UNIT/ML IJ SOLN: 0.0000 [IU] | INTRAMUSCULAR | Status: DC

## 2024-06-19 MED ORDER — INSULIN GLARGINE-YFGN 100 UNIT/ML ~~LOC~~ SOLN
5.0000 [IU] | Freq: Once | SUBCUTANEOUS | Status: AC
  Administered 2024-06-19: 5 [IU] via SUBCUTANEOUS
  Filled 2024-06-19: qty 0.05

## 2024-06-19 MED ORDER — INSULIN ASPART 100 UNIT/ML IJ SOLN
0.0000 [IU] | INTRAMUSCULAR | Status: DC
  Administered 2024-06-19: 3 [IU] via SUBCUTANEOUS
  Administered 2024-06-19 (×2): 5 [IU] via SUBCUTANEOUS
  Administered 2024-06-20: 9 [IU] via SUBCUTANEOUS
  Administered 2024-06-20: 2 [IU] via SUBCUTANEOUS
  Administered 2024-06-20: 1 [IU] via SUBCUTANEOUS
  Administered 2024-06-20: 2 [IU] via SUBCUTANEOUS
  Administered 2024-06-20: 7 [IU] via SUBCUTANEOUS
  Administered 2024-06-21: 3 [IU] via SUBCUTANEOUS
  Administered 2024-06-21: 1 [IU] via SUBCUTANEOUS
  Administered 2024-06-21: 7 [IU] via SUBCUTANEOUS
  Administered 2024-06-21: 2 [IU] via SUBCUTANEOUS

## 2024-06-19 MED ORDER — HYDRALAZINE HCL 20 MG/ML IJ SOLN
10.0000 mg | Freq: Four times a day (QID) | INTRAMUSCULAR | Status: DC | PRN
  Administered 2024-06-20: 10 mg via INTRAVENOUS
  Filled 2024-06-19: qty 1

## 2024-06-19 MED ORDER — AMLODIPINE BESYLATE 5 MG PO TABS
5.0000 mg | ORAL_TABLET | Freq: Every day | ORAL | Status: DC
  Administered 2024-06-19 – 2024-06-21 (×3): 5 mg via ORAL
  Filled 2024-06-19 (×3): qty 1

## 2024-06-19 MED ORDER — METOPROLOL TARTRATE 50 MG PO TABS
50.0000 mg | ORAL_TABLET | Freq: Two times a day (BID) | ORAL | Status: DC
Start: 2024-06-19 — End: 2024-06-22
  Administered 2024-06-19 – 2024-06-22 (×6): 50 mg via ORAL
  Filled 2024-06-19 (×6): qty 1

## 2024-06-19 MED ORDER — INSULIN GLARGINE-YFGN 100 UNIT/ML ~~LOC~~ SOLN
20.0000 [IU] | Freq: Every day | SUBCUTANEOUS | Status: DC
  Administered 2024-06-20 – 2024-06-21 (×2): 20 [IU] via SUBCUTANEOUS
  Filled 2024-06-19 (×2): qty 0.2

## 2024-06-19 NOTE — Progress Notes (Signed)
 Progress Note   Patient: Kristen Bowman FMW:[id number] DOB: 1930/10/09 DOA: 06/15/2024     4 DOS: the patient was seen and examined on 06/19/2024  The patient is a 89 year old female on hospice care with a PMHx of dementia, CKD stage II, T2DM, HTN, anemia who presented to the ED on 06/15/2024 from [facility] for increased lethargy, hypoxia, and hyperglycemia, found to be in DKA with acute hypoxic respiratory failure and sepsis secondary to aspiration pneumonia.  Assessment and Plan:  # Acute hypoxic respiratory failure - improving - Presented with SpO2 of 88% on RA with tachypnea in the 20s - Likely secondary to aspiration pneumonia - O2 requirement is improving significantly, down to 1 L/min - Continue to wean O2 as tolerated  # Sepsis  - Presented with leukocytosis to 12.6, tachypnea 27, lactic acid elevated to 3.4 with AMS and AKI, with concern for underlying aspiration pneumonia and UTI - Management of underlying etiology as noted below  # Possible aspiration pneumonia - Concern for aspiration pneumonia given encephalopathy with findings concerning for old emesis by RN on oral care  - CXR (10/22) showing new confluent new retrocardiac opacity on the left compatible with new or increased left lower lung collapse or consolidation - RPP negative - Procalcitonin 0.21 on 10/22 - Febrile to 100.6 F overnight - Leukocytosis resolved with WBC 10.4 - Blood cultures (10/21) NGTD - Vanc discontinued after negative MRSA PCR - Continue meropenem  - Aspiration precautions, dysphagia diet per SLP  # Dysphagia - Patient presented with encephalopathy secondary to DKA complicated by aspiration pneumonia. Her mental status has improved. However, she remains dysphagic. SLP is following and has recommended a dysphagia diet with nectar thick liquids. - Today, SLP eval and RN staff noted increased difficulty with feeds and frequent pocketing of food.  - Discussed goals of care with the  patient's daughter, [name], and her granddaughter, [name]. They requested placement of a temporary Cortrak feeding tube.  Discussed with the family the risks and benefits of Cortrak feeding tube placement in the setting of advanced age and dysphagia with aspiration.  Explained that while tube allows for nutrition medications, it does not prevent aspiration and increased risks including discomfort, tube dislodgment, infection, and malposition family verbalized understanding and wishes to proceed with placement. - Cortrak placement ordered - Dietician consulted   # DKA - resolved - Presented with hyperglycemia to 770s in the setting of not receiving insulin  at SNF during 5-day stay. BhB elevated to > 8 on admission - BhB wnl - AG remains closed this morning - Transitioned off insulin  drip on 10/24 2025 - Diabetes coordinator following - Gave an extra Semglee  5 units this morning due to elevated fasting BG - Increase Semglee  to 20 units daily and NovoLog  to 0-9 q4h - Modified diet to dysphagia + carb modified diet  #Lactic acidosis - resolved - Episodes of recurrent lactic acidosis, possibly secondary to aspiration pneumonia and dehydration - Resolved with IVFs  # High anion gap metabolic acidosis - resolved - Likely secondary to lactic acidosis and DKA - Continue management of underlying causes as above  # Possible UTI - UA concerning for UTI. Patient encephalopathic and unable to reliably communicate symptoms - History of ESBL E. Coli - Urine culture showing greater than 100,000 colonies of multiple species and suggested recollection.  However, patient receiving antibiotics with clinical improvement, so repeat UA/UCx futile at this point - Continue meropenem  # Hypernatremia - resolved - Elevated Na in the setting of  dehydration and osmotic diuresis from DKA - Na 142  # Hypokalemia - resolved  #Hypertension - Patient with elevated BP to 170-190s the majority of the day - PRN IV  hydralazine  10 mg q6h and PRN Labetalol  5 mg q2h  - Home antihypertensive regimen includes amlodipine  5 mg daily, olmesartan 40 mg daily, metoprolol  tartrate 50 mg twice daily - Continue irbesartan 300 mg (home med alternative) - Increased metoprolol  to 50 mg BID - Resumed home amlodipine  5 mg daily  #Acute metabolic encephalopathy - improving - Likely secondary to acute illness caused by aspiration pneumonia in the setting of underlying dementia - Continue treating underlying causes as above - Delirium precautions  #AKI on CKD stage IIIa - resolved - Creatinine elevated 2.74 on admission, baseline around 0.9 - Likely prerenal in the setting of dehydration from vomiting and osmotic diuresis - Improved with IVFs - Cr stable  #Elevated troponins - High-sensitivity troponins peaked at 192 - Likely secondary to acute illness, aspiration pneumonia, DKA  - Less likely ACS - Echo showed LVEF 70 to 75% with no regional wall motion normalities.  Noted severe asymmetric left ventricular hypertrophy of the basal septal segment - Cardiology evaluated - indicated unlikely ACS if no abnormalities on echo - Heparin  drip discontinued on 10/22  #Dementia #Anxiety #Depression - Home meds include sertraline  50 mg daily, Klonopin  0.5 mg daily, Depakote  125 mg twice daily, donepezil  5 mg daily, trazodone  50 mg nightly, risperidone  0.5 mg nightly. Initially held due to AMS and NPO. Will resume cautiously. - Resumed home Klonopin  0.5 mg daily  Subjective: Patient seen bedside. She is awake and alert today, although appears a little bit more lethargic than yesterday.  She is still able to answer questions.  SLP and RN staff noted that she was not doing as well with feeds today, she was noted to be pocketing food.  Discussed the goals moving forward with the patient's daughter, [name], and granddaughter, [name], who indicated there is a strong preference for placement of a temporary feeding tube in hopes that the  patient will regain some strength with some nutrition.  Discussed in great length the risks associated with placement of Cortrak feedings tubes, as noted above.   Physical Exam: Vitals:   06/19/24 0200 06/19/24 0400 06/19/24 0742 06/19/24 0927  BP:  (!) 163/63 (!) 159/51 (!) 167/61  Pulse:  99 (!) 114 (!) 111  Resp:  (!) 22 13   Temp: 98.9 F (37.2 C) 99.5 F (37.5 C) 98.4 F (36.9 C)   TempSrc: Oral Axillary Axillary   SpO2:  96% 92%   Weight:      Height:       Physical Exam Constitutional:      General: She is not in acute distress.    Appearance: She is ill-appearing.     Interventions: Face mask in place.  Cardiovascular:     Rate and Rhythm: Regular rhythm. Tachycardia present.     Heart sounds: Normal heart sounds. No murmur heard. Pulmonary:     Effort: Pulmonary effort is normal. No respiratory distress.     Breath sounds: Decreased breath sounds present. No wheezing.  Abdominal:     General: There is no distension.     Palpations: Abdomen is soft.     Tenderness: There is no abdominal tenderness.  Musculoskeletal:     Right lower leg: No edema.     Left lower leg: No edema.  Skin:    General: Skin is warm and dry.  Capillary Refill: Capillary refill takes less than 2 seconds.  Neurological:     Mental Status: She is alert. Mental status is at baseline.     Family Communication: Discussed with patient's daughter, [name], and granddaughter, [name]  Disposition: Status is: Inpatient Remains inpatient appropriate because: Treatment of sepsis secondary to aspiration pneumonia and UTI with IV antibiotics, pending placement of Cortrak feeding tube  Planned Discharge Destination: Home with Hospice  DVT prophylaxis: heparin  injection 5,000 Units Start: 06/16/24 2200   Time spent: 55 minutes  Author: Duffy Larch, MD 06/19/2024 9:55 AM  For on call review [email or web address].

## 2024-06-19 NOTE — Inpatient Diabetes Management (Signed)
 Inpatient Diabetes Program Recommendations  AACE/ADA: New Consensus Statement on Inpatient Glycemic Control   Target Ranges:  Prepandial:   less than 140 mg/dL      Peak postprandial:   less than 180 mg/dL (1-2 hours)      Critically ill patients:  140 - 180 mg/dL    Latest Reference Range & Units 06/18/24 09:55 06/18/24 10:58 06/18/24 11:55 06/18/24 12:56 06/18/24 13:57 06/18/24 14:57 06/18/24 16:01 06/18/24 20:23 06/18/24 23:47 06/19/24 03:59 06/19/24 07:40  Glucose-Capillary 70 - 99 mg/dL 824 (H) 836 (H) 762 (H) 207 (H) 167 (H) 179 (H) 264 (H) 378 (H) 319 (H) 283 (H) 272 (H)   Review of Glycemic Control  Diabetes history: DM1 Outpatient Diabetes medications: Lantus  20 units at bedtime, Humalog  3-8 units TID with meals, Metformin  750 mg daily Current orders for Inpatient glycemic control: Semglee  15 units daily, Novolog  0-6 units Q4H  Inpatient Diabetes Program Recommendations:    Insulin : Please consider increasing Semglee  to 20 units daily and Novolog  correction to 0-9 units Q4H.  If patient is eating well, consider adding Novolog  3 units TID with meals for meal coverage if patient eats at least 50% of meals.  NOTE: Noted consult for Diabetes Coordinator. Diabetes Coordinator is not on campus over the weekend but available by pager from 8am to 5pm for questions or concerns.   Thanks, Earnie Gainer, RN, MSN, CDCES Diabetes Coordinator Inpatient Diabetes Program [phone] (Team Pager from 8am to 5pm)

## 2024-06-19 NOTE — Progress Notes (Signed)
 Crushed medications given with applesauce in AM. Pt was gagging after small bites. Provided oral suctioning to remove food in oral cavity. Family member advised not to feed pt due to difficulty swallowing. Family still gave pt bites of dinner. Pt pocketing food in mouth, MD notified. Provider came to bedside and discussed with family to not feed patient. New order for Cortrak.

## 2024-06-19 NOTE — Plan of Care (Signed)
  Problem: Cardiac: Goal: Ability to maintain an adequate cardiac output will improve 06/19/2024 2007 by Gladis Dania HERO, RN Outcome: Progressing 06/19/2024 2002 by Gladis Dania HERO, RN Outcome: Progressing   Problem: Respiratory: Goal: Will regain and/or maintain adequate ventilation 06/19/2024 2007 by Gladis Dania HERO, RN Outcome: Progressing 06/19/2024 2002 by Gladis Dania HERO, RN Outcome: Progressing   Problem: Elimination: Goal: Will not experience complications related to urinary retention 06/19/2024 2007 by Gladis Dania HERO, RN Outcome: Progressing 06/19/2024 2002 by Gladis Dania HERO, RN Outcome: Progressing   Problem: Pain Managment: Goal: General experience of comfort will improve and/or be controlled 06/19/2024 2007 by Gladis Dania HERO, RN Outcome: Progressing 06/19/2024 2002 by Gladis Dania HERO, RN Outcome: Progressing

## 2024-06-19 NOTE — Plan of Care (Signed)
  Problem: Nutritional: Goal: Maintenance of adequate nutrition will improve Outcome: Progressing Goal: Maintenance of adequate weight for body size and type will improve Outcome: Progressing   Problem: Safety: Goal: Ability to remain free from injury will improve Outcome: Progressing

## 2024-06-19 NOTE — Progress Notes (Signed)
 Speech Language Pathology Treatment: Dysphagia  Patient Details Name: Kristen Bowman MRN: [id number] DOB: 1930-12-05 Today's Date: 06/19/2024 Time: 9060-9040 SLP Time Calculation (min) (ACUTE ONLY): 20 min  Assessment / Plan / Recommendation Pt seen for skilled SLP to establish helpful compensation strategies for her dysphagia. She was alert and cooperative, appears with cognitive difficulties - stating 'My teeth are dizzy = when inquired if her head was dizzy also, she verbalized yes. Oral care provided - removing suspected oral candidiasis and/or cottage cheese consistency secretions. Pt willing to take her medications provided by RN crushed with applesauce. She was provided with tsps thin, tsps, cup and straw boluses of nectar. Prolonged oral holding and delayed swallow noted with pt benefiting from verbal cues and even larger liquid bolus to aid swallow efficiency. No overt coughing but gagging x2 observed, therefore nystatin was held for now.   SLP orally suctioned pt to clear OJ from oral cavity at end of snack. Given plan is for her to go home with hospice, current diet plan may help her comfort. Established compensations for dysphagia mitigation. Daughter not present and she has pt's dentures. Will sign off - recommend continue Dys2/nectar - FREE WATER  between meals after mouth care and precautions. Updated signs in room.      HPI HPI: Patient is a 89 y.o. female with past medical history of diabetes mellitus type 2, hypertension, history of E. Coli UTI and associated encephalopahty, right bundle branch block, chronic anemia, chronic kidney disease stage II, anemia, dementia, depression independent and oriented at baseline come from AMS and hyperglycemia. Patient was receiving respite care at a SNF and was admitted to the hospital on 10/21 secondary to AMS hyperglycemia (daughter suspects patient was not being given her insulin ) and altered mental status and UA was positive for UTI. Also with  PNA, suspected aspiration. Per RN note from 10/22, during oral care, suspected emesis in oral cavity and around dentures. Bedside swallow evaluation ordered to assess patient's swallow.      SLP Plan  Goals updated          Recommendations  Diet recommendations: Dysphagia 2 (fine chop);Nectar-thick liquid (water  protocol) Liquids provided via: Cup;Straw Medication Administration: Crushed with puree Supervision: Staff to assist with self feeding;Full supervision/cueing for compensatory strategies Compensations: Slow rate;Small sips/bites;Other (Comment) (start intake with liquids) Postural Changes and/or Swallow Maneuvers: Seated upright 90 degrees (oral suction after meals)                  Oral care BID;Other (Comment) (oral suction after meals)   Frequent or constant Supervision/Assistance Dysphagia, oropharyngeal phase (R13.12)     Goals updated     Nicolas Emmie Caldron  06/19/2024, 10:52 AM

## 2024-06-20 ENCOUNTER — Inpatient Hospital Stay (HOSPITAL_COMMUNITY)

## 2024-06-20 DIAGNOSIS — G9340 Encephalopathy, unspecified: Secondary | ICD-10-CM | POA: Diagnosis not present

## 2024-06-20 LAB — GLUCOSE, CAPILLARY
Glucose-Capillary: 111 mg/dL — ABNORMAL HIGH (ref 70–99)
Glucose-Capillary: 144 mg/dL — ABNORMAL HIGH (ref 70–99)
Glucose-Capillary: 166 mg/dL — ABNORMAL HIGH (ref 70–99)
Glucose-Capillary: 319 mg/dL — ABNORMAL HIGH (ref 70–99)
Glucose-Capillary: 327 mg/dL — ABNORMAL HIGH (ref 70–99)
Glucose-Capillary: 376 mg/dL — ABNORMAL HIGH (ref 70–99)

## 2024-06-20 LAB — CBC
HCT: 35.1 % — ABNORMAL LOW (ref 36.0–46.0)
HCT: 35.3 % — ABNORMAL LOW (ref 36.0–46.0)
Hemoglobin: 11 g/dL — ABNORMAL LOW (ref 12.0–15.0)
Hemoglobin: 11.2 g/dL — ABNORMAL LOW (ref 12.0–15.0)
MCH: 27.9 pg (ref 26.0–34.0)
MCH: 28.3 pg (ref 26.0–34.0)
MCHC: 31.3 g/dL (ref 30.0–36.0)
MCHC: 31.7 g/dL (ref 30.0–36.0)
MCV: 89.1 fL (ref 80.0–100.0)
MCV: 89.1 fL (ref 80.0–100.0)
Platelets: 151 K/uL (ref 150–400)
Platelets: 168 K/uL (ref 150–400)
RBC: 3.94 MIL/uL (ref 3.87–5.11)
RBC: 3.96 MIL/uL (ref 3.87–5.11)
RDW: 16.2 % — ABNORMAL HIGH (ref 11.5–15.5)
RDW: 16 % — ABNORMAL HIGH (ref 11.5–15.5)
WBC: 8.5 K/uL (ref 4.0–10.5)
WBC: 7.4 K/uL (ref 4.0–10.5)
nRBC: 0 % (ref 0.0–0.2)
nRBC: 0 % (ref 0.0–0.2)

## 2024-06-20 LAB — BASIC METABOLIC PANEL WITH GFR
Anion gap: 11 (ref 5–15)
BUN: 32 mg/dL — ABNORMAL HIGH (ref 8–23)
CO2: 25 mmol/L (ref 22–32)
Calcium: 8.7 mg/dL — ABNORMAL LOW (ref 8.9–10.3)
Chloride: 112 mmol/L — ABNORMAL HIGH (ref 98–111)
Creatinine, Ser: 0.87 mg/dL (ref 0.44–1.00)
GFR, Estimated: >60 mL/min (ref >60)
Glucose, Bld: 133 mg/dL — ABNORMAL HIGH (ref 70–99)
Potassium: 3.8 mmol/L (ref 3.5–5.1)
Sodium: 148 mmol/L — ABNORMAL HIGH (ref 135–145)

## 2024-06-20 LAB — CULTURE, BLOOD (ROUTINE X 2)
Culture: NO GROWTH
Culture: NO GROWTH

## 2024-06-20 LAB — MRSA CULTURE

## 2024-06-20 MED ORDER — THIAMINE MONONITRATE 100 MG PO TABS
100.0000 mg | ORAL_TABLET | Freq: Every day | ORAL | Status: AC
Start: 2024-06-20 — End: 2024-06-25
  Administered 2024-06-20 – 2024-06-24 (×5): 100 mg via ORAL
  Filled 2024-06-20 (×5): qty 1

## 2024-06-20 MED ORDER — ENSURE PLUS HIGH PROTEIN PO LIQD
237.0000 mL | Freq: Two times a day (BID) | ORAL | Status: DC
  Administered 2024-06-20 – 2024-06-29 (×15): 237 mL via ORAL

## 2024-06-20 NOTE — Progress Notes (Signed)
 Initial Nutrition Assessment  INTERVENTION:   Once Cortrak tube placed: -Initiate Osmolite 1.5 @ 20 ml/hr, advance by 10 ml every 12 hours to goal rate of 50 ml/hr. -Goal provides 1800 kcals, 75g protein and 914 ml H2O Free water  recommendations: 100 ml every 4 hours (600 ml)  Monitor magnesium, potassium, and phosphorus daily for at least 3 days, MD to replete as needed, as pt is at risk for refeeding syndrome. -Recommend 100 mg Thiamine daily x 5-7 days  PO as able: -Ensure Plus High Protein po BID, each supplement provides 350 kcal and 20 grams of protein.   NUTRITION DIAGNOSIS:   Inadequate oral intake related to chronic illness (dementia) as evidenced by per patient/family report  GOAL:   Patient will meet greater than or equal to 90% of their needs  MONITOR:   PO intake, Supplement acceptance  REASON FOR ASSESSMENT:   Consult Assessment of nutrition requirement/status, Enteral/tube feeding initiation and management, Poor PO  ASSESSMENT:   89 year old female on hospice care with a PMHx of dementia, CKD stage II, T2DM, HTN, anemia who presented to the ED on 06/15/2024 from [facility] for increased lethargy, hypoxia, and hyperglycemia, found to be in DKA with acute hypoxic respiratory failure and sepsis secondary to aspiration pneumonia.  10/21: admitted  Patient with dementia from SNF. Pt currently alert/oriented x 1. Pt not eating well, has been pocketing her food. SLP note on 10/23 noted oral thrush. Family reports pt does not consume a restricted diet PTA. Pt had a vomiting episode which led to concern pt aspirated. SLP recommended 10/24 dysphagia 2 diet with nectar liquids given dysphagia. Pt continues to gag with small bites and pocketing food. MD has placed order for Cortrak tube, service available next date so will leave tube feeding recommendations. Ensure supplements have been ordered in the meantime and accepted so far this morning. Pt expected to  discharge home with hospice.   Per weight records, pt has lost 29 lbs since 8/2 (17% wt loss x 2.5 months, significant for time frame). Weight on 10/21 admission was 164 lbs.  Current weight: 138 lbs. Suspect this has been more of a progressive change  with fluid involvement. Pt with mild BLE and BUE edema currently.  Medications: insulin   Labs reviewed: CBGs: 111-144 Elevated sodium   Elevated BUN (32) Low calcium   NUTRITION - FOCUSED PHYSICAL EXAM:  Unable to complete, working remotely.  Diet Order:   Diet Order             DIET DYS 2 Room service appropriate? Yes; Fluid consistency: Nectar Thick  Diet effective now                   EDUCATION NEEDS:   No education needs have been identified at this time  Skin:  Skin Assessment: Skin Integrity Issues: Skin Integrity Issues:: Stage I Stage I: left heel, medial sacrum, right sacrum  Last BM:  10/25  Height:   Ht Readings from Last 1 Encounters:  06/15/24 5' 6 (1.676 m)    Weight:   Wt Readings from Last 1 Encounters:  06/15/24 62.8 kg    BMI:  Body mass index is 22.35 kg/m.  Estimated Nutritional Needs:   Kcal:  1500-1700  Protein:  75-90g  Fluid:  1.5L/day   Morna Lee, MS, RD, LDN Inpatient Clinical Dietitian Contact via Secure chat

## 2024-06-20 NOTE — Plan of Care (Signed)

## 2024-06-20 NOTE — Progress Notes (Signed)
 Progress Note   Patient: Kristen Bowman FMW:[id number] DOB: Feb 12, 1931 DOA: 06/15/2024     5 DOS: the patient was seen and examined on 06/20/2024  The patient is a 89 year old female on hospice care with a PMHx of dementia, CKD stage II, T2DM, HTN, anemia who presented to the ED on 06/15/2024 from [facility] for increased lethargy, hypoxia, and hyperglycemia, found to be in DKA with acute hypoxic respiratory failure and sepsis secondary to aspiration pneumonia.  Assessment and Plan:  # Acute hypoxic respiratory failure - improving - Presented with SpO2 of 88% on RA with tachypnea in the 20s - Likely secondary to aspiration pneumonia - On 3-4 L/min [place] today  - Continue to wean O2 as tolerated  # Sepsis  - Presented with leukocytosis to 12.6, tachypnea 27, lactic acid elevated to 3.4 with AMS and AKI, with concern for underlying aspiration pneumonia and UTI - Management of underlying etiology as noted below  # Aspiration pneumonia - Concern for aspiration pneumonia given encephalopathy with findings concerning for old emesis on oral care  - CXR (10/22) showing new confluent new retrocardiac opacity on the left compatible with new or increased left lower lung collapse or consolidation - RPP negative - Procalcitonin 0.21 on 10/22 - Afebrile overnight - No leukocytosis - Blood cultures (10/21) NGTD - Vanc discontinued after negative MRSA PCR - Continue meropenem for total 7 days - Aspiration precautions, dysphagia diet per SLP - Repeat procal and CXR today given slight increase in O2 requirement  # Dysphagia - Patient presented with encephalopathy secondary to DKA complicated by aspiration pneumonia. Her mental status has improved. However, she remains dysphagic. SLP is following and has recommended a dysphagia diet with nectar thick liquids. - Discussed goals of care with the patient's daughter, Kristen Bowman, and her granddaughter, Kristen Bowman. They requested placement of a temporary Cortrak  feeding tube.  Discussed with the family the risks and benefits of Cortrak feeding tube placement in the setting of advanced age and dysphagia with aspiration.  Explained that while tube allows for nutrition medications, it does not prevent aspiration and increased risks including discomfort, tube dislodgment, infection, and malposition family verbalized understanding and wishes to proceed with placement. - Did better with feeding today, was assisted by her granddaughter. However, they are still requesting moving forward with Cortrak placement - Cortrak placement ordered - Dietician consulted, recs made  # DKA - resolved - Presented with hyperglycemia to 770s in the setting of not receiving insulin  at SNF during 5-day stay. BhB elevated to > 8 on admission - BhB wnl - AG remains closed this morning - Transitioned off insulin  drip on 10/24 - Diabetes coordinator following - Continue Semglee  to 20 units daily and NovoLog  to 0-9 q4h - Modified diet to dysphagia + carb modified diet  #Lactic acidosis - resolved - Episodes of recurrent lactic acidosis, possibly secondary to aspiration pneumonia and dehydration - Resolved with IVFs  # High anion gap metabolic acidosis - resolved - Likely secondary to lactic acidosis and DKA - Continue management of underlying causes as above  # Possible UTI - UA concerning for UTI. Patient encephalopathic and unable to reliably communicate symptoms - History of ESBL E. Coli - Urine culture showing greater than 100,000 colonies of multiple species and suggested recollection.  However, patient receiving antibiotics with clinical improvement, so repeat UA/UCx futile at this point - Continue meropenem  # Hypernatremia - resolved - Initially elevated Na in the setting of dehydration and osmotic diuresis from DKA then  resolved. - Elevated again secondary to dehydration  - Na 148 - Encouraged free water  intake   # Hypokalemia - resolved  #Hypertension -  Patient with elevated BP to 170-190s the majority of the day - PRN IV hydralazine  10 mg q6h and PRN Labetalol  5 mg q2h  - Home antihypertensive regimen includes amlodipine  5 mg daily, olmesartan 40 mg daily, metoprolol  tartrate 50 mg twice daily - Continue irbesartan 300 mg (home med alternative) - Increased metoprolol  to 50 mg BID - Resumed home amlodipine  5 mg daily  #Acute metabolic encephalopathy - improving - Likely secondary to acute illness caused by aspiration pneumonia in the setting of underlying dementia - Continue treating underlying causes as above - Delirium precautions  #AKI on CKD stage IIIa - resolved - Creatinine elevated 2.74 on admission, baseline around 0.9 - Likely prerenal in the setting of dehydration from vomiting and osmotic diuresis - Improved with IVFs - Cr stable  #Elevated troponins - High-sensitivity troponins peaked at 192 - Likely secondary to acute illness, aspiration pneumonia, DKA  - Less likely ACS - Echo showed LVEF 70 to 75% with no regional wall motion normalities.  Noted severe asymmetric left ventricular hypertrophy of the basal septal segment - Cardiology evaluated - indicated unlikely ACS if no abnormalities on echo - Heparin  drip discontinued on 10/22  #Dementia #Anxiety #Depression - Home meds include sertraline  50 mg daily, Klonopin  0.5 mg daily, Depakote  125 mg twice daily, donepezil  5 mg daily, trazodone  50 mg nightly, risperidone  0.5 mg nightly. Initially held due to AMS and NPO. Will resume cautiously. - Resumed home Klonopin  0.5 mg daily  Subjective: Patient seen bedside. She is awake and alert today, stated she was pretty good. Patient's granddaughter, Kristen Bowman, was at bedside assisting with feeding the patient who was doing well with more moist and smooth textured foods. Discussed their desire to continue moving forward with Cortrak tube placement.   Physical Exam: Vitals:   06/20/24 0805 06/20/24 1151 06/20/24 1153 06/20/24 1610   BP: (!) 169/74 (!) 200/78 (!) 191/76 (!) 138/50  Pulse: (!) 108 89 83 (!) 106  Resp: 19 (!) 27 15 16   Temp: 97.8 F (36.6 C) 98.4 F (36.9 C) 97.8 F (36.6 C) 98.2 F (36.8 C)  TempSrc: Oral Oral Oral Oral  SpO2: 92% 99% 98% 96%  Weight:      Height:       Physical Exam Constitutional:      General: She is not in acute distress.    Appearance: She is ill-appearing.     Interventions: Face mask in place.  Cardiovascular:     Rate and Rhythm: Regular rhythm. Tachycardia present.     Heart sounds: Normal heart sounds. No murmur heard. Pulmonary:     Effort: Pulmonary effort is normal. No respiratory distress.     Breath sounds: No wheezing.     Comments: Faint bibasilar crackles Abdominal:     General: There is no distension.     Palpations: Abdomen is soft.     Tenderness: There is no abdominal tenderness.  Musculoskeletal:     Right lower leg: No edema.     Left lower leg: No edema.  Skin:    General: Skin is warm and dry.     Capillary Refill: Capillary refill takes less than 2 seconds.  Neurological:     Mental Status: She is alert. Mental status is at baseline.     Comments: Able to answer simple questions, follows basic commands  Family Communication: Discussed with granddaughter, Kristen Bowman, at bedside  Disposition: Status is: Inpatient Remains inpatient appropriate because: Treatment of sepsis secondary to aspiration pneumonia and UTI with IV antibiotics, pending placement of Cortrak feeding tube  Planned Discharge Destination: Home with Hospice  DVT prophylaxis: heparin  injection 5,000 Units Start: 06/16/24 2200   Time spent: 40 minutes  Author: Duffy Larch, MD 06/20/2024 6:19 PM  For on call review [email or web address].

## 2024-06-21 DIAGNOSIS — Z515 Encounter for palliative care: Secondary | ICD-10-CM

## 2024-06-21 DIAGNOSIS — G9340 Encephalopathy, unspecified: Secondary | ICD-10-CM | POA: Diagnosis not present

## 2024-06-21 DIAGNOSIS — R4182 Altered mental status, unspecified: Secondary | ICD-10-CM | POA: Diagnosis not present

## 2024-06-21 DIAGNOSIS — Z711 Person with feared health complaint in whom no diagnosis is made: Secondary | ICD-10-CM

## 2024-06-21 DIAGNOSIS — Z789 Other specified health status: Secondary | ICD-10-CM

## 2024-06-21 DIAGNOSIS — E1010 Type 1 diabetes mellitus with ketoacidosis without coma: Secondary | ICD-10-CM | POA: Diagnosis not present

## 2024-06-21 DIAGNOSIS — R531 Weakness: Secondary | ICD-10-CM

## 2024-06-21 DIAGNOSIS — Z66 Do not resuscitate: Secondary | ICD-10-CM

## 2024-06-21 DIAGNOSIS — Z7189 Other specified counseling: Secondary | ICD-10-CM

## 2024-06-21 LAB — GLUCOSE, CAPILLARY
Glucose-Capillary: 154 mg/dL — ABNORMAL HIGH (ref 70–99)
Glucose-Capillary: 143 mg/dL — ABNORMAL HIGH (ref 70–99)
Glucose-Capillary: 226 mg/dL — ABNORMAL HIGH (ref 70–99)
Glucose-Capillary: 259 mg/dL — ABNORMAL HIGH (ref 70–99)
Glucose-Capillary: 334 mg/dL — ABNORMAL HIGH (ref 70–99)

## 2024-06-21 LAB — BASIC METABOLIC PANEL WITH GFR
Anion gap: 12 (ref 5–15)
BUN: 28 mg/dL — ABNORMAL HIGH (ref 8–23)
CO2: 25 mmol/L (ref 22–32)
Calcium: 8.5 mg/dL — ABNORMAL LOW (ref 8.9–10.3)
Chloride: 112 mmol/L — ABNORMAL HIGH (ref 98–111)
Creatinine, Ser: 0.84 mg/dL (ref 0.44–1.00)
GFR, Estimated: >60 mL/min (ref >60)
Glucose, Bld: 157 mg/dL — ABNORMAL HIGH (ref 70–99)
Potassium: 3.8 mmol/L (ref 3.5–5.1)
Sodium: 149 mmol/L — ABNORMAL HIGH (ref 135–145)

## 2024-06-21 LAB — PROCALCITONIN: Procalcitonin: 0.2 ng/mL

## 2024-06-21 LAB — CBC
HCT: 35.8 % — ABNORMAL LOW (ref 36.0–46.0)
Hemoglobin: 11.2 g/dL — ABNORMAL LOW (ref 12.0–15.0)
MCH: 28.1 pg (ref 26.0–34.0)
MCHC: 31.3 g/dL (ref 30.0–36.0)
MCV: 89.9 fL (ref 80.0–100.0)
Platelets: 172 K/uL (ref 150–400)
RBC: 3.98 MIL/uL (ref 3.87–5.11)
RDW: 15.8 % — ABNORMAL HIGH (ref 11.5–15.5)
WBC: 6 K/uL (ref 4.0–10.5)
nRBC: 0 % (ref 0.0–0.2)

## 2024-06-21 MED ORDER — INSULIN GLARGINE-YFGN 100 UNIT/ML ~~LOC~~ SOLN
25.0000 [IU] | Freq: Every day | SUBCUTANEOUS | Status: DC
  Administered 2024-06-22 – 2024-06-23 (×2): 25 [IU] via SUBCUTANEOUS
  Filled 2024-06-21 (×2): qty 0.25

## 2024-06-21 MED ORDER — INSULIN ASPART 100 UNIT/ML IJ SOLN
0.0000 [IU] | Freq: Three times a day (TID) | INTRAMUSCULAR | Status: DC
  Administered 2024-06-21: 5 [IU] via SUBCUTANEOUS
  Administered 2024-06-22 (×2): 9 [IU] via SUBCUTANEOUS
  Administered 2024-06-22: 3 [IU] via SUBCUTANEOUS
  Administered 2024-06-23: 1 [IU] via SUBCUTANEOUS
  Administered 2024-06-23: 5 [IU] via SUBCUTANEOUS
  Administered 2024-06-23 – 2024-06-24 (×4): 1 [IU] via SUBCUTANEOUS
  Administered 2024-06-26: 2 [IU] via SUBCUTANEOUS
  Administered 2024-06-26: 1 [IU] via SUBCUTANEOUS
  Administered 2024-06-27: 2 [IU] via SUBCUTANEOUS
  Administered 2024-06-27: 7 [IU] via SUBCUTANEOUS
  Administered 2024-06-27: 5 [IU] via SUBCUTANEOUS
  Administered 2024-06-28 (×3): 3 [IU] via SUBCUTANEOUS
  Administered 2024-06-29: 5 [IU] via SUBCUTANEOUS
  Administered 2024-06-29: 2 [IU] via SUBCUTANEOUS
  Filled 2024-06-21: qty 2
  Filled 2024-06-21: qty 5
  Filled 2024-06-21: qty 3
  Filled 2024-06-21 (×2): qty 2
  Filled 2024-06-21: qty 5
  Filled 2024-06-21: qty 3
  Filled 2024-06-21: qty 2
  Filled 2024-06-21: qty 7
  Filled 2024-06-21: qty 1

## 2024-06-21 MED ORDER — INSULIN ASPART 100 UNIT/ML IJ SOLN
0.0000 [IU] | Freq: Every day | INTRAMUSCULAR | Status: DC
  Administered 2024-06-21: 4 [IU] via SUBCUTANEOUS
  Administered 2024-06-22: 3 [IU] via SUBCUTANEOUS
  Administered 2024-06-25: 2 [IU] via SUBCUTANEOUS
  Administered 2024-06-26: 3 [IU] via SUBCUTANEOUS
  Administered 2024-06-27 – 2024-06-29 (×2): 2 [IU] via SUBCUTANEOUS
  Filled 2024-06-21 (×3): qty 2

## 2024-06-21 MED ORDER — AMLODIPINE BESYLATE 10 MG PO TABS
10.0000 mg | ORAL_TABLET | Freq: Every day | ORAL | Status: DC
  Administered 2024-06-22 – 2024-06-29 (×8): 10 mg via ORAL
  Filled 2024-06-21 (×8): qty 1

## 2024-06-21 NOTE — TOC Progression Note (Addendum)
 Transition of Care [facility]) - Progression Note    Patient Details  Name: Kristen Bowman MRN: [id number] Date of Birth: [birth year]  [facility] CM/SW Contact  Graves-Bigelow, Erminio Deems, RN Phone Number: 06/21/2024, 11:52 AM  Clinical Narrative: Inpatient Case Manager continues to follow for disposition needs. [facility] is following for [facility].  1605 ICM received a call from [facility] and she states that the patient has been discharged from Hospice Services at this time. Per Liaison, the patient can be re-instated into Hospice post hospitalization if the family wants to continue services. No further needs at this time.  Expected Discharge Plan: Home w Hospice Care Barriers to Discharge: Continued Medical Work up  Expected Discharge Plan and Services In-house Referral: NA Discharge Planning Services: CM Consult Post Acute Care Choice: Hospice, Resumption of Svcs/PTA Provider Living arrangements for the past 2 months: Single Family Home  Social Drivers of Health (SDOH) Interventions SDOH Screenings   Food Insecurity: No Food Insecurity (06/16/2024)  Housing: Low Risk  (06/16/2024)  Transportation Needs: No Transportation Needs (06/16/2024)  Utilities: Not At Risk (06/16/2024)  Social Connections: Socially Isolated (06/16/2024)  Tobacco Use: Low Risk  (06/16/2024)    Readmission Risk Interventions     No data to display

## 2024-06-21 NOTE — Inpatient Diabetes Management (Signed)
 Inpatient Diabetes Program Recommendations  AACE/ADA: New Consensus Statement on Inpatient Glycemic Control   Target Ranges:  Prepandial:   less than 140 mg/dL      Peak postprandial:   less than 180 mg/dL (1-2 hours)      Critically ill patients:  140 - 180 mg/dL   Lab Results  Component Value Date   GLUCAP 226 (H) 06/21/2024   HGBA1C 9.4 (H) 06/15/2024    Latest Reference Range & Units 06/20/24 07:51 06/20/24 12:00 06/20/24 16:24 06/20/24 20:36 06/20/24 23:40 06/21/24 03:46 06/21/24 07:50 06/21/24 11:50  Glucose-Capillary 70 - 99 mg/dL 855 (H) 833 (H) 680 (H) 376 (H) 327 (H) 154 (H) 143 (H) 226 (H)   Review of Glycemic Control  Diabetes history: DM2  Outpatient Diabetes medications:  Lantus  20 units at bedtime  Humalog  3-8 units TID Metformin  750mg  daily   Current orders for Inpatient glycemic control:  Semglee  20 units daily  Novolog  0-9 units Q4HRS  Novolog  3 units TID with meals   Inpatient Diabetes Program Recommendations:   Please consider changing Novolog  correction to 0-9 units TID + 0-5 units at bedtime (AC/HS) and increase Semglee  to 25 units daily.   Thanks,  Lavanda Search, RN, MSN, [facility]  Inpatient Diabetes Coordinator  Pager [phone] (8a-5p)

## 2024-06-21 NOTE — Plan of Care (Signed)

## 2024-06-21 NOTE — Progress Notes (Signed)
 Progress Note   Patient: Kristen Bowman FMW:[id number] DOB: [birth year] DOA: 06/15/2024     6 DOS: the patient was seen and examined on 06/21/2024  The patient is a 89 year old female on hospice care with a PMHx of dementia, CKD stage II, T2DM, HTN, anemia who presented to the ED on 06/15/2024 from [facility] for increased lethargy, hypoxia, and hyperglycemia, found to be in DKA with acute hypoxic respiratory failure and sepsis secondary to aspiration pneumonia.  Assessment and Plan:  # Acute hypoxic respiratory failure - improving - Presented with SpO2 of 88% on RA with tachypnea in the 20s - Likely secondary to aspiration pneumonia - On 3-4 L/min [place] today  - Continue to wean O2 as tolerated - CXR (10/26) showed mildly increased reticular and patchy opacities in the right lower lung with a mildly enlarged heart.  # Sepsis  - Presented with leukocytosis to 12.6, tachypnea 27, lactic acid elevated to 3.4 with AMS and AKI, with concern for underlying aspiration pneumonia and UTI - Management of underlying etiology as noted below  # Aspiration pneumonia - Concern for aspiration pneumonia given encephalopathy with findings concerning for old emesis on oral care  - CXR (10/22) showing new confluent new retrocardiac opacity on the left compatible with new or increased left lower lung collapse or consolidation - RPP negative - Procalcitonin 0.21 on 10/22 - Afebrile overnight - No leukocytosis - Blood cultures (10/21) NGTD - Continue meropenem for total 7 days - Aspiration precautions, dysphagia diet per SLP  # Dysphagia - Patient presented with encephalopathy secondary to DKA complicated by aspiration pneumonia. Her mental status has improved. However, she remains dysphagic. SLP is following and has recommended a dysphagia diet with nectar thick liquids. - Discussed goals of care with the patient's daughter, Kristen Bowman, and her granddaughter, Kristen Bowman. They requested placement of a temporary  Cortrak feeding tube.  Discussed with the family the risks and benefits of Cortrak feeding tube placement in the setting of advanced age and dysphagia with aspiration.  Explained that while tube allows for nutrition medications, it does not prevent aspiration and increased risks including discomfort, tube dislodgment, infection, and malposition family verbalized understanding and wishes to proceed with placement. - Reportedly did very well with feeding today, eating >50% of her breakfast.  Spoke to patient's granddaughter, Kristen Bowman, this morning who decided to defer placement of Cortrak feeding tube today  # DKA - resolved - Presented with hyperglycemia to 770s in the setting of not receiving insulin  at SNF during 5-day stay. BhB elevated to > 8 on admission - BhB wnl - AG remains closed this morning - Transitioned off insulin  drip on 10/24 - Diabetes coordinator following - Continue Semglee  to 20 units daily and NovoLog  to 0-9 q4h - Modified diet to dysphagia + carb modified diet  #Lactic acidosis - resolved - Episodes of recurrent lactic acidosis, possibly secondary to aspiration pneumonia and dehydration - Resolved with IVFs  # High anion gap metabolic acidosis - resolved - Likely secondary to lactic acidosis and DKA - Continue management of underlying causes as above  # Possible UTI - UA concerning for UTI. Patient encephalopathic and unable to reliably communicate symptoms - History of ESBL E. Coli - Urine culture showing greater than 100,000 colonies of multiple species and suggested recollection.  However, patient receiving antibiotics with clinical improvement, so repeat UA/UCx futile at this point - Completed 7 days of meropenem  # Hypernatremia - Initially elevated Na in the setting of dehydration and osmotic diuresis  from DKA then resolved. - Elevated again secondary to dehydration  - Na 149 - Encouraged free water  intake   # Hypokalemia - resolved  #Hypertension - Better BP  control today - 120-140s - PRN IV hydralazine  10 mg q6h and PRN Labetalol  5 mg q2h  - Home antihypertensive regimen includes amlodipine  5 mg daily, olmesartan 40 mg daily, metoprolol  tartrate 50 mg twice daily - Continue irbesartan 300 mg (home med alternative), metoprolol  to 50 mg BID - Increased amlodipine  to 10 mg daily  #Acute metabolic encephalopathy - improving - Likely secondary to acute illness caused by aspiration pneumonia in the setting of underlying dementia - Continue treating underlying causes as above - Delirium precautions  #AKI on CKD stage IIIa - resolved - Creatinine elevated 2.74 on admission, baseline around 0.9 - Likely prerenal in the setting of dehydration from vomiting and osmotic diuresis - Improved with IVFs - Cr stable  #Elevated troponins - High-sensitivity troponins peaked at 192 - Likely secondary to acute illness, aspiration pneumonia, DKA  - Less likely ACS - Echo showed LVEF 70 to 75% with no regional wall motion normalities.  Noted severe asymmetric left ventricular hypertrophy of the basal septal segment - Cardiology evaluated - indicated unlikely ACS if no abnormalities on echo - Heparin  drip discontinued on 10/22  #Dementia #Anxiety #Depression - Home meds include sertraline  50 mg daily, Klonopin  0.5 mg daily, Depakote  125 mg twice daily, donepezil  5 mg daily, trazodone  50 mg nightly, risperidone  0.5 mg nightly. Initially held due to AMS and NPO. Will resume cautiously. - Resumed home Klonopin  0.5 mg daily  Subjective: Patient seen bedside. She is awake and alert today, stated she was pretty good.  Oriented only to person.  Reportedly did well with her feeds today consuming more than half her breakfast this morning.  Discussed feeding tube with patient's daughter, who opted to defer placement today due to improvement in patient's feeding overall.   Physical Exam: Vitals:   06/21/24 0330 06/21/24 0747 06/21/24 0818 06/21/24 0912  BP: (!)  144/52 (!) 163/62 (!) 171/61 (!) 171/61  Pulse: 86 86 91 (!) 102  Resp: 16 17 17    Temp: 99.1 F (37.3 C) 98.9 F (37.2 C) 98.1 F (36.7 C)   TempSrc: Axillary Axillary Oral   SpO2: 94% 95% 93%   Weight:      Height:       Physical Exam Constitutional:      General: She is not in acute distress.    Appearance: She is ill-appearing.     Interventions: Face mask in place.  Cardiovascular:     Rate and Rhythm: Regular rhythm. Tachycardia present.     Heart sounds: Normal heart sounds. No murmur heard. Pulmonary:     Effort: Pulmonary effort is normal. No respiratory distress.     Breath sounds: No wheezing.     Comments: Faint bibasilar crackles Abdominal:     General: There is no distension.     Palpations: Abdomen is soft.     Tenderness: There is no abdominal tenderness.  Musculoskeletal:     Right lower leg: No edema.     Left lower leg: No edema.  Skin:    General: Skin is warm and dry.     Capillary Refill: Capillary refill takes less than 2 seconds.  Neurological:     Mental Status: She is alert. Mental status is at baseline.     Comments: Able to answer simple questions, follows basic commands  Family Communication: Discussed with granddaughter, Kristen Bowman, over the phone  Disposition: Status is: Inpatient Remains inpatient appropriate because: pending clinical improvement  Planned Discharge Destination: Home with Hospice  DVT prophylaxis: heparin  injection 5,000 Units Start: 06/16/24 2200   Time spent: 36 minutes  Author: Duffy Larch, MD 06/21/2024 10:14 AM  For on call review [email or web address].

## 2024-06-21 NOTE — Progress Notes (Signed)
 Daily Progress Note   Patient Name: NEESHA LANGTON       Date: 06/21/2024 DOB: [birth year]  Age: 89 y.o. MRN#: [id number] Attending Physician: Al-Sultani, Anmar, MD Primary Care Physician: Valentin Skates, DO Admit Date: 06/15/2024  Reason for Consultation/Follow-up: Establishing goals of care  Subjective: I have reviewed medical records including EPIC notes, MAR, and labs. Noted coretrak to be placed today. Received report from primary RN - no acute concerns. Per RN patient ate >50% of breakfast and lunch. RN reports patient is up to chair but remains very weak, needing 2+assist.   Went to visit patient at bedside - granddaughter/Lisa present. Patient was sitting up in chair - she is awake and says hello, but falls asleep quickly after my arrival. No signs or non-verbal gestures of pain or discomfort noted. No respiratory distress, increased work of breathing, or secretions noted. She is ill appearing.   Emotional support provided to [place]. Discussion had in regards to coretrak and goals for hospice after discharge. We reviewed that it is anticipated patient will continue to decline and at this time, it may be unlikely if she will recover to previous baseline. Natural trajectory at EOL reviewed. Recommendation given against coretrak.  [name] states that because patient's oral intake has improved today, they wish to hold off on coretrak placement. They are hopeful patient will continue to eat, but they are taking it one day at a time.   [name] also notes that [facility] may unenroll patient from their services while admitted. They can re-enroll her after discharge. Family/patient are considering re-enrolling with [facility] as [facility] [employer] is 1 mile from their home - will update TOC.  All questions  and concerns addressed. Encouraged to call with questions and/or concerns. PMT card previously provided.  Length of Stay: 6  Current Medications: Scheduled Meds:   amLODipine   5 mg Oral Daily   clonazePAM   0.5 mg Oral Daily   feeding supplement  237 mL Oral BID BM   heparin  injection (subcutaneous)  5,000 Units Subcutaneous Q8H   insulin  aspart  0-9 Units Subcutaneous Q4H   insulin  aspart  3 Units Subcutaneous TID WC   insulin  glargine-yfgn  20 Units Subcutaneous Daily   irbesartan  300 mg Oral Daily   metoprolol  tartrate  50 mg Oral BID   nystatin  5 mL  Oral QID   thiamine  100 mg Oral Daily    Continuous Infusions:  meropenem (MERREM) IV 1 g (06/21/24 0929)    PRN Meds: acetaminophen , dextrose , hydrALAZINE , labetalol   Physical Exam Vitals and nursing note reviewed.  Constitutional:      General: She is not in acute distress. Pulmonary:     Effort: No respiratory distress.  Skin:    General: Skin is warm and dry.  Neurological:     Comments: sleepy             Vital Signs: BP (!) 140/49 (BP Location: Left Arm)   Pulse 95   Temp 97.9 F (36.6 C) (Oral)   Resp 18   Ht 5' 6 (1.676 m)   Wt 69 kg   SpO2 95%   BMI 24.55 kg/m  SpO2: SpO2: 95 % O2 Device: O2 Device: Nasal Cannula O2 Flow Rate: O2 Flow Rate (L/min): 3 L/min  Intake/output summary: No intake or output data in the 24 hours ending 06/21/24 1333 LBM: Last BM Date : 06/19/24 Baseline Weight: Weight: 74.8 kg Most recent weight: Weight: 69 kg       Palliative Assessment/Data: PPS 40-50%      Patient Active Problem List   Diagnosis Date Noted   Elevated troponin 06/16/2024   Pressure injury of skin 06/16/2024   AMS (altered mental status) 06/15/2024   UTI (urinary tract infection) 03/27/2024   Acute encephalopathy 03/26/2024   Diabetes mellitus type 2 in nonobese (HCC) 03/26/2024   Dementia without behavioral disturbance (HCC) 10/17/2019   Pulmonary hypertension (HCC) 06/09/2014    Tachycardia 09/24/2012   Palpitations 09/24/2012   CKD (chronic kidney disease), stage II 09/24/2012   Anemia, iron  deficiency 09/24/2012   Hypertension    Panic disorder    Hyperlipidemia    Type II or unspecified type diabetes mellitus with renal manifestations, uncontrolled(250.42) 05/29/2010    Palliative Care Assessment & Plan   Patient Profile: 89 y.o. female  with past medical history of diabetes mellitus type 2, hypertension, history of E. coli UTI and associated encephalopathy, right bundle branch block, chronic anemia, chronic kidney disease stage II, anemia,  dementia, depression was admitted on 06/15/2024 with AMS secondary to metabolic encephalopathy, DKA/DM II, AKI on CKD II/dehydration, sepsis secondary to aspiration pneumonia and UTI, acute hypoxic respiratory failure.    Of note, patient has had 2 admissions and 1 ED visit in the last 6 months.  Assessment: Principal Problem:   AMS (altered mental status) Active Problems:   Elevated troponin   Pressure injury of skin   Concern about end of life  Recommendations/Plan: Continue to treat the treatable Continue DNR/DNI as previously documented - durable DNR form completed and placed in shadow chart. Copy was made and will be scanned into Vynca/ACP tab Patient/family hopeful patient's oral intake will remain adequate, but they will continue discussions regarding coretrak pending clinical course. Recommendation given against coretrak as it is anticipated patient will continue to decline. If decision is made to pursue coretrak, recommend time limited trial Goal remains for discharge home with hospice once medically stable Family may want to switch hospice organizations from [facility] to [facility] notified PMT will continue to follow peripherally. If there are any imminent needs please call the service directly   Goals of Care and Additional Recommendations: Limitations on Scope of Treatment: No  Tracheostomy  Code Status:    Code Status Orders  (From admission, onward)  Start     Ordered   06/16/24 0217  Do not attempt resuscitation (DNR)- Limited -Do Not Intubate (DNI)  (Code Status)  Continuous       Question Answer Comment  If pulseless and not breathing No CPR or chest compressions.   In Pre-Arrest Conditions (Patient Is Breathing and Has A Pulse) Do not intubate. Provide all appropriate non-invasive medical interventions. Avoid ICU transfer unless indicated or required.   Consent: Discussion documented in EHR or advanced directives reviewed      06/16/24 0216           Code Status History     Date Active Date Inactive Code Status Order ID Comments User Context   06/15/2024 1727 06/16/2024 0216 Full Code [id number]  Tobie Mario GAILS, MD ED   03/27/2024 0322 04/01/2024 1652 Full Code [id number]  Franky Redia SAILOR, MD Inpatient   11/13/2019 0754 11/20/2019 1648 Full Code [id number]  Cottie Donnice PARAS, MD ED   10/17/2019 0236 10/22/2019 0204 Full Code [id number]  Vicky Lamar RIGGERS ED   09/25/2012 0051 09/26/2012 1638 Full Code [id number]  Cruise, Delon RAMAN, RN Inpatient      Advance Directive Documentation    Flowsheet Row Most Recent Value  Type of Advance Directive Healthcare Power of Attorney  Pre-existing out of facility DNR order (yellow form or pink MOST form) --  MOST Form in Place? --    Prognosis:  < 6 months  Discharge Planning: Home with Hospice  Care plan was discussed with primary RN, patient's family, TOC, Dr. Mosie  Thank you for allowing the Palliative Medicine Team to assist in the care of this patient.   Total Time 50 minutes Prolonged Time Billed  no       Jeoffrey CHRISTELLA Sharps, NP  Please contact Palliative Medicine Team phone at [phone] for questions and concerns.   *Portions of this note are a verbal dictation therefore any spelling and/or grammatical errors are due to the Dragon [employer] One system  interpretation.

## 2024-06-21 NOTE — Progress Notes (Signed)
 Cortrak Brief Note  Spoke with provider and care team. RN reports pt ate 50% of breakfast which provider relayed to family. Family wants to hold off on placing Cortrak at this time to monitor patient's intake.   Please re-order Cortrak tube if desired.   Josette Glance, MS, RDN, LDN Clinical Dietitian I Please reach out via secure chat

## 2024-06-21 NOTE — Consult Note (Signed)
 WOC Nurse Consult Note:  WOC consult performed remotely utilizing imaging and chart review Reason for Consult: sacrum and heel ulcer Wound type: Stage 3 pressure injury to Sacrum- chart review reveals imaging from admission showing a DTPI to sacrum, evolved now to stage 3 2.  Deep tissue pressure injury to L heel Pressure Injury POA: No Measurement: see nursing flow sheets Wound bed: Sacrum: 100% red, moist L heel: deep purple maroon discoloration Drainage (amount, consistency, odor) see nursing flow sheets Periwound: intact Dressing procedure/placement/frequency:  Sacrum/ L Heel: Cleanse with NS, pat dry.  Apply Xeroform to wound and cover with silicone foam dressing.  Off load bilateral heels with Prevalon boots (lawson # [id number])        WOC Nurse team will follow with you and see patient within 10 days for wound assessments.  Please notify WOC nurses of any acute changes in the wounds or any new areas of concern  Thank you,  Doyal Polite, MSN, RN, [facility] WOC Team [phone] (Available Mon-Fri 0700-1500)

## 2024-06-21 NOTE — Progress Notes (Signed)
 Nutrition Follow-up  INTERVENTION:   Continue 100 mg Thiamine daily x 5 days   Continue DYS 2 + Nectar Thick liquids per SLP  Ensure Plus High Protein po BID, each supplement provides 350 kcal and 20 grams of protein.  Thicken to appropriate consistently   NUTRITION DIAGNOSIS:   Inadequate oral intake related to chronic illness (dementia) as evidenced by per patient/family report - ongoing   GOAL:   Patient will meet greater than or equal to 90% of their needs  MONITOR:   PO intake, Supplement acceptance  REASON FOR ASSESSMENT:   Consult Assessment of nutrition requirement/status, Enteral/tube feeding initiation and management, Poor PO  ASSESSMENT:   89 year old female on hospice care with a PMHx of dementia, CKD stage II, T2DM, HTN, anemia who presented to the ED on 06/15/2024 from [facility] for increased lethargy, hypoxia, and hyperglycemia, found to be in DKA with acute hypoxic respiratory failure and sepsis secondary to aspiration pneumonia.  10/21: admitted  Pt seen in room, granddaughter at bedside. Reports patient has a good appetite at baseline PTA. Typically ate three meals per day, breakfast would be 2 scrambled eggs, cheese toast and bacon; lunch would be a sandwich with chips and dinner varied depending on what food was cooked. No swallowing issues with food PTA, did struggle with pills unless crushed. No recent weight changes reported. Pt usually weigh around 160 lbs. Cortrak was originally ordered over the weekend, patient with better po intake of breakfast today and family wants to hold on placement. Granddaughter reports pt does better with creamy foods like mashed potatoes and pudding. Reviewed current diet recommendations from SLP for DYS 2 diet with nectar thick liquids. Noted patient wears dentures at baseline, are now at bedside and recommended to granddaughter to have patient wear with meals. Pt ate 100% sausage from breakfast, 100% ensure, 100% milk and  100% juice.    Admit weight: 62.9 kg  Current weight: 69 kg  Nutritionally Relevant Medications:  SSI 0-9 units q 4 hrs, Novolog  3 units TID, Semglee  20 units daily, thiamine 100 mg    Labs Reviewed: CBG 143-376, sodium 148, Glu 133, BUN 32, Phos 2.3    NUTRITION - FOCUSED PHYSICAL EXAM:  Flowsheet Row Most Recent Value  Orbital Region No depletion  Upper Arm Region Moderate depletion  Thoracic and Lumbar Region No depletion  Buccal Region Mild depletion  Temple Region Mild depletion  Clavicle Bone Region Mild depletion  Clavicle and Acromion Bone Region Mild depletion  Scapular Bone Region Mild depletion  Dorsal Hand No depletion  Patellar Region No depletion  Anterior Thigh Region No depletion  Posterior Calf Region Mild depletion  Hair Reviewed  Eyes Reviewed  Mouth Reviewed  Skin Reviewed  Nails Reviewed     Diet Order:   Diet Order             DIET DYS 2 Room service appropriate? Yes; Fluid consistency: Nectar Thick  Diet effective now                   EDUCATION NEEDS:   No education needs have been identified at this time  Skin:  Skin Assessment: Skin Integrity Issues: Skin Integrity Issues:: Stage I Stage I: left heel, medial sacrum, right sacrum - DTPI to sacrum POA now beloved to stage III pressure injury per WOCN on 10/27   Last BM:  10/25  Height:   Ht Readings from Last 1 Encounters:  06/15/24 5' 6 (1.676 m)  Weight:   Wt Readings from Last 1 Encounters:  06/15/24 62.8 kg    BMI:  Body mass index is 22.35 kg/m.  Estimated Nutritional Needs:   Kcal:  1500-1700  Protein:  75-90g  Fluid:  1.5L/day  Apolonia Ellwood, MS, RD, LDN Clinical Dietitian  Contact via secure chat. If unavailable, use group chat RD Inpatient.

## 2024-06-21 NOTE — Plan of Care (Signed)
  Problem: Education: Goal: Ability to describe self-care measures that may prevent or decrease complications (Diabetes Survival Skills Education) will improve 06/21/2024 0740 by Ponciano Wells DEL, RN Outcome: Progressing 06/21/2024 0740 by Ponciano Wells DEL, RN Outcome: Progressing Goal: Individualized Educational Video(s) 06/21/2024 0740 by Ponciano Wells DEL, RN Outcome: Progressing 06/21/2024 0740 by Ponciano Wells DEL, RN Outcome: Progressing   Problem: Coping: Goal: Ability to adjust to condition or change in health will improve 06/21/2024 0740 by Ponciano Wells DEL, RN Outcome: Progressing 06/21/2024 0740 by Ponciano Wells DEL, RN Outcome: Progressing   Problem: Fluid Volume: Goal: Ability to maintain a balanced intake and output will improve 06/21/2024 0740 by Ponciano Wells DEL, RN Outcome: Progressing 06/21/2024 0740 by Ponciano Wells DEL, RN Outcome: Progressing

## 2024-06-21 NOTE — Progress Notes (Signed)
 Physical Therapy Treatment Patient Details Name: Kristen Bowman MRN: [id number] DOB: 1931-06-19 Today's Date: 06/21/2024   History of Present Illness The pt is a 89yo female presenting 06/15/24 with AMS and lethargy. Pt + for DKA, AKI, possible UTI and acute hypoxic respiratory failure likely from aspiration.  PMH includes: dementia, anemia, DM II, osteoarthritis, HLD, CKD II HTN, IBS, and afib.    PT Comments  The pt was agreeable to session, able to follow commands for mobility and assist more this session. Pt remains limited in strength of all extremities, core strength, and endurance. Pt initially able to maintain sitting balance with CGA, but progressed to modA due to rapid onset of fatigue. Pt completed multiple standing trials, but limited by strength and endurance after each stand. Recommendations remain appropriate, will continue to follow acutely and progress as able.     If plan is discharge home, recommend the following: A lot of help with walking and/or transfers;A lot of help with bathing/dressing/bathroom;Assistance with cooking/housework;Assist for transportation   Can travel by private vehicle     No  Equipment Recommendations  Other (comment) (TBD next venue)    Recommendations for Other Services       Precautions / Restrictions Precautions Precautions: Fall Recall of Precautions/Restrictions: Impaired Restrictions Weight Bearing Restrictions Per Provider Order: No     Mobility  Bed Mobility Overal bed mobility: Needs Assistance Bed Mobility: Supine to Sit     Supine to sit: Mod assist, Max assist, +2 for safety/equipment, HOB elevated     General bed mobility comments: pt able to initiate assist with LE and attempting to scoot with use of UE and LE but poor ability to generate movement on bed surface.    Transfers Overall transfer level: Needs assistance Equipment used: 2 person hand held assist Transfers: Sit to/from Stand, Bed to  chair/wheelchair/BSC Sit to Stand: Max assist, +2 physical assistance   Step pivot transfers: Max assist, +2 safety/equipment       General transfer comment: modA initially, then maxA to maintain. posterior lean with poor hip extension. assist to faciliate lateral stepping    Ambulation/Gait Ambulation/Gait assistance: Max assist, +2 physical assistance Gait Distance (Feet): 3 Feet Assistive device: 2 person hand held assist Gait Pattern/deviations: Step-to pattern, Decreased stride length, Leaning posteriorly Gait velocity: decreased Gait velocity interpretation: <1.31 ft/sec, indicative of household ambulator   General Gait Details: posterior lean with poor hip extension, assist to facilaite wt shift for lateral stepping to chair     Balance Overall balance assessment: Needs assistance Sitting-balance support: Feet supported, Bilateral upper extremity supported Sitting balance-Leahy Scale: Poor Sitting balance - Comments: from CGA to modA to maintain static sitting, fatgues quickly Postural control: Posterior lean, Left lateral lean Standing balance support: Bilateral upper extremity supported, During functional activity Standing balance-Leahy Scale: Zero Standing balance comment: dependent on external support                            Communication Communication Communication: Impaired Factors Affecting Communication: Difficulty expressing self  Cognition Arousal: Alert Behavior During Therapy: WFL for tasks assessed/performed   PT - Cognitive impairments: No family/caregiver present to determine baseline, Initiation, Problem solving, Safety/Judgement                       PT - Cognition Comments: pt oriented to self, aware she is in [place] and was familiar with [employer]. able to follow simple cues Following  commands: Impaired Following commands impaired: Follows one step commands with increased time    Cueing Cueing Techniques: Verbal  cues, Gestural cues  Exercises General Exercises - Lower Extremity Long Arc Quad: AROM, Both, 10 reps, Seated    General Comments General comments (skin integrity, edema, etc.): BP stable, SpO2 initially 96% on 3L, 87% on 3l after session, RN present and advised bumping to 6L after no improvement on 4L      Pertinent Vitals/Pain Pain Assessment Pain Assessment: No/denies pain Faces Pain Scale: No hurt Pain Intervention(s): Monitored during session     PT Goals (current goals can now be found in the care plan section) Acute Rehab PT Goals Patient Stated Goal: none stated Time For Goal Achievement: 07/02/24 Potential to Achieve Goals: Fair Progress towards PT goals: Progressing toward goals    Frequency    Min 2X/week       AM-PAC PT 6 Clicks Mobility   Outcome Measure  Help needed turning from your back to your side while in a flat bed without using bedrails?: A Lot Help needed moving from lying on your back to sitting on the side of a flat bed without using bedrails?: A Lot Help needed moving to and from a bed to a chair (including a wheelchair)?: A Lot Help needed standing up from a chair using your arms (e.g., wheelchair or bedside chair)?: A Lot Help needed to walk in hospital room?: Total Help needed climbing 3-5 steps with a railing? : Total 6 Click Score: 10    End of Session Equipment Utilized During Treatment: Oxygen;Gait belt Activity Tolerance: Patient limited by fatigue;Patient tolerated treatment well Patient left: with call bell/phone within reach;in chair;with chair alarm set;with nursing/sitter in room Nurse Communication: Mobility status;Other (comment) (O2) PT Visit Diagnosis: Other abnormalities of gait and mobility (R26.89);Muscle weakness (generalized) (M62.81)     Time: 8942-8873 PT Time Calculation (min) (ACUTE ONLY): 29 min  Charges:    $Therapeutic Exercise: 23-37 mins PT General Charges $$ ACUTE PT VISIT: 1 Visit                      Izetta Call, PT, DPT   Acute Rehabilitation Department Office [phone] Secure Chat Communication Preferred   Izetta JULIANNA Call 06/21/2024, 12:18 PM

## 2024-06-21 NOTE — Plan of Care (Signed)

## 2024-06-21 NOTE — Progress Notes (Signed)
 Mobility Specialist Progress Note;   06/21/24 1512  Mobility  Activity Pivoted/transferred from chair to bed  Level of Assistance Maximum assist, patient does 25-49%  Assistive Device None  Distance Ambulated (ft) 3 ft  Activity Response Tolerated fair  Mobility Referral Yes  Mobility visit 1 Mobility  Mobility Specialist Start Time (ACUTE ONLY) 1513  Mobility Specialist Stop Time (ACUTE ONLY) 1522  Mobility Specialist Time Calculation (min) (ACUTE ONLY) 9 min   Pt requesting assistance back to bed. Required MaxA+2 to stand pivot transfer pt back to bed. VSS on supplemental O2. Pt left comfortably in bed with all needs met, alarm on.   Lauraine Erm Mobility Specialist Please contact via SecureChat or [employer] [phone]

## 2024-06-22 DIAGNOSIS — G9340 Encephalopathy, unspecified: Secondary | ICD-10-CM | POA: Diagnosis not present

## 2024-06-22 DIAGNOSIS — J690 Pneumonitis due to inhalation of food and vomit: Secondary | ICD-10-CM | POA: Insufficient documentation

## 2024-06-22 DIAGNOSIS — E1110 Type 2 diabetes mellitus with ketoacidosis without coma: Secondary | ICD-10-CM | POA: Insufficient documentation

## 2024-06-22 DIAGNOSIS — E870 Hyperosmolality and hypernatremia: Secondary | ICD-10-CM | POA: Insufficient documentation

## 2024-06-22 DIAGNOSIS — J9601 Acute respiratory failure with hypoxia: Secondary | ICD-10-CM | POA: Insufficient documentation

## 2024-06-22 LAB — BASIC METABOLIC PANEL WITH GFR
Anion gap: 8 (ref 5–15)
BUN: 30 mg/dL — ABNORMAL HIGH (ref 8–23)
CO2: 25 mmol/L (ref 22–32)
Calcium: 8.2 mg/dL — ABNORMAL LOW (ref 8.9–10.3)
Chloride: 108 mmol/L (ref 98–111)
Creatinine, Ser: 0.85 mg/dL (ref 0.44–1.00)
GFR, Estimated: >60 mL/min (ref >60)
Glucose, Bld: 425 mg/dL — ABNORMAL HIGH (ref 70–99)
Potassium: 3.9 mmol/L (ref 3.5–5.1)
Sodium: 141 mmol/L (ref 135–145)

## 2024-06-22 LAB — CBC
HCT: 33 % — ABNORMAL LOW (ref 36.0–46.0)
Hemoglobin: 10.2 g/dL — ABNORMAL LOW (ref 12.0–15.0)
MCH: 28 pg (ref 26.0–34.0)
MCHC: 30.9 g/dL (ref 30.0–36.0)
MCV: 90.7 fL (ref 80.0–100.0)
Platelets: 178 K/uL (ref 150–400)
RBC: 3.64 MIL/uL — ABNORMAL LOW (ref 3.87–5.11)
RDW: 15.8 % — ABNORMAL HIGH (ref 11.5–15.5)
WBC: 4.5 K/uL (ref 4.0–10.5)
nRBC: 0 % (ref 0.0–0.2)

## 2024-06-22 LAB — GLUCOSE, CAPILLARY
Glucose-Capillary: 351 mg/dL — ABNORMAL HIGH (ref 70–99)
Glucose-Capillary: 241 mg/dL — ABNORMAL HIGH (ref 70–99)
Glucose-Capillary: 368 mg/dL — ABNORMAL HIGH (ref 70–99)
Glucose-Capillary: 262 mg/dL — ABNORMAL HIGH (ref 70–99)

## 2024-06-22 LAB — BRAIN NATRIURETIC PEPTIDE: B Natriuretic Peptide: 152 pg/mL — ABNORMAL HIGH (ref 0.0–100.0)

## 2024-06-22 MED ORDER — DONEPEZIL HCL 5 MG PO TABS
5.0000 mg | ORAL_TABLET | Freq: Every day | ORAL | Status: DC
  Administered 2024-06-22 – 2024-06-29 (×7): 5 mg via ORAL
  Filled 2024-06-22 (×7): qty 1

## 2024-06-22 MED ORDER — METOPROLOL TARTRATE 50 MG PO TABS
75.0000 mg | ORAL_TABLET | Freq: Two times a day (BID) | ORAL | Status: DC
  Administered 2024-06-22 – 2024-06-29 (×14): 75 mg via ORAL
  Filled 2024-06-22 (×14): qty 1

## 2024-06-22 MED ORDER — INSULIN ASPART 100 UNIT/ML IJ SOLN
5.0000 [IU] | Freq: Three times a day (TID) | INTRAMUSCULAR | Status: DC
  Administered 2024-06-23 – 2024-06-24 (×6): 5 [IU] via SUBCUTANEOUS

## 2024-06-22 MED ORDER — FUROSEMIDE 10 MG/ML IJ SOLN
40.0000 mg | Freq: Once | INTRAMUSCULAR | Status: AC
  Administered 2024-06-22: 40 mg via INTRAVENOUS
  Filled 2024-06-22: qty 4

## 2024-06-22 MED ORDER — DIVALPROEX SODIUM 125 MG PO CSDR
250.0000 mg | DELAYED_RELEASE_CAPSULE | Freq: Two times a day (BID) | ORAL | Status: DC
  Administered 2024-06-23 – 2024-06-29 (×13): 250 mg via ORAL
  Filled 2024-06-22 (×14): qty 2

## 2024-06-22 NOTE — Progress Notes (Addendum)
 Progress Note   Patient: Kristen Bowman FMW:[id number] DOB: 1930-09-28 DOA: 06/15/2024     7 DOS: the patient was seen and examined on 06/22/2024  The patient is a 89 year old female on hospice care with a PMHx of dementia, CKD stage II, T2DM, HTN, anemia who presented to the ED on 06/15/2024 from [facility] for increased lethargy, hypoxia, and hyperglycemia, found to be in DKA with acute hypoxic respiratory failure and sepsis secondary to aspiration pneumonia.  Assessment and Plan:  # Acute hypoxic respiratory failure - improving - Presented with SpO2 of 88% on RA with tachypnea in the 20s. Initially, likely secondary to aspiration pneumonia.  - She is still requiring supplemental O2 at this time, suspect she is somewhat volume overloaded (net +3.9L since admission) given amount of fluids she received for DKA, noted mild LE edema and bilateral crackles on exam - On 3 L/min [place] today  - Continue to wean O2 as tolerated - CXR (10/26) showed mildly increased reticular and patchy opacities in the right lower lung with a mildly enlarged heart. - IV Lasix 40 mg once today since she is Lasix naive, consider second dose again tomorrow based on response.  # Sepsis  - Presented with leukocytosis to 12.6, tachypnea 27, lactic acid elevated to 3.4 with AMS and AKI, with concern for underlying aspiration pneumonia and UTI - Management of underlying etiology as noted below  # Aspiration pneumonia - Concern for aspiration pneumonia given encephalopathy with findings concerning for old emesis on oral care  - CXR (10/22) showing new confluent new retrocardiac opacity on the left compatible with new or increased left lower lung collapse or consolidation - RPP negative - Procalcitonin 0.21 on 10/22 - Afebrile overnight - No leukocytosis - Blood cultures (10/21) NGTD - Completed 7-day course meropenem - Aspiration precautions, dysphagia diet per SLP  #Hypertension - BP uncontrolled this morning  160-180s - PRN IV hydralazine  10 mg q6h and PRN Labetalol  5 mg q2h  - Home antihypertensive regimen includes amlodipine  5 mg daily, olmesartan 40 mg daily, metoprolol  tartrate 50 mg twice daily - Continue irbesartan 300 mg (home med alternative), amlodipine  10 mg daily - Metoprolol  increased to home dose of 75 mg BID  # Hypernatremia - resolved - Initially elevated Na in the setting of dehydration and osmotic diuresis from DKA then resolved. - Na 141 today - Encouraged continued free water  intake   # Dysphagia - Patient presented with encephalopathy secondary to DKA complicated by aspiration pneumonia. Her mental status has improved. However, she remains dysphagic. SLP is following and has recommended a dysphagia diet with nectar thick liquids. - Continues to do very well with 1:1 feeding. Family not interested in Cortrak anymore.  # DKA - resolved - Presented with hyperglycemia to 770s in the setting of not receiving insulin  at SNF during 5-day stay. BhB elevated to > 8 on admission - BhB wnl - AG remains closed this morning - Transitioned off insulin  drip on 10/24 - Diabetes coordinator following - Continue Semglee  to 20 units daily and NovoLog  to 0-9 q4h - Modified diet to dysphagia + carb modified diet  #Lactic acidosis - resolved - Episodes of recurrent lactic acidosis, possibly secondary to aspiration pneumonia and dehydration - Resolved with IVFs  # High anion gap metabolic acidosis - resolved - Likely secondary to lactic acidosis and DKA - Continue management of underlying causes as above  # Possible UTI - UA concerning for UTI. Patient encephalopathic and unable to reliably communicate symptoms -  History of ESBL E. Coli - Urine culture showing greater than 100,000 colonies of multiple species and suggested recollection.  However, patient receiving antibiotics with clinical improvement, so repeat UA/UCx futile at this point - Completed 7 days of meropenem  # Hypokalemia  - resolved  #Acute metabolic encephalopathy - resolved - Likely secondary to acute illness caused by aspiration pneumonia in the setting of underlying dementia - Continue treating underlying causes as above - Delirium precautions - Appears back to mental baseline, oriented to self  #AKI on CKD stage IIIa - resolved - Creatinine elevated 2.74 on admission, baseline around 0.9 - Likely prerenal in the setting of dehydration from vomiting and osmotic diuresis - Improved with IVFs - Cr stable  #Elevated troponins - High-sensitivity troponins peaked at 192 - Likely secondary to acute illness, aspiration pneumonia, DKA  - Less likely ACS - Echo showed LVEF 70 to 75% with no regional wall motion normalities.  Noted severe asymmetric left ventricular hypertrophy of the basal septal segment - Cardiology evaluated - indicated unlikely ACS if no abnormalities on echo - Heparin  drip discontinued on 10/22  #Dementia #Anxiety #Depression - Home meds include sertraline  50 mg daily, Klonopin  0.5 mg daily, Depakote  125 mg twice daily, donepezil  5 mg daily, trazodone  50 mg nightly, risperidone  0.5 mg nightly. Initially held due to AMS and NPO. Will resume cautiously. - Continue home Klonopin  0.5 mg daily - Resumed home Aricept  and Depakote   Subjective: Patient was seen sitting in bedside recliner.  She appears to be doing very well.  Still only oriented to person but that is her baseline.  Her granddaughter, [name], was at bedside and was assisting her with drinking water . Still on 3L [place].   Physical Exam: Vitals:   06/22/24 0423 06/22/24 0803 06/22/24 0832 06/22/24 1140  BP: (!) 165/58 (!) 168/66 (!) 180/75 (!) 174/62  Pulse: 72 93 95 100  Resp: 19 16  17   Temp: 99.1 F (37.3 C) 98.2 F (36.8 C)  (!) 97.5 F (36.4 C)  TempSrc: Oral Oral  Oral  SpO2: 98% 93% 97% 96%  Weight:      Height:       Physical Exam Constitutional:      General: She is not in acute distress.    Appearance: She is  ill-appearing.     Interventions: Face mask in place.  Cardiovascular:     Rate and Rhythm: Normal rate and regular rhythm.     Heart sounds: Normal heart sounds. No murmur heard. Pulmonary:     Effort: Pulmonary effort is normal. No respiratory distress.     Breath sounds: No wheezing.     Comments: Bilateral bibasilar crackles Abdominal:     General: There is no distension.     Palpations: Abdomen is soft.     Tenderness: There is no abdominal tenderness.  Musculoskeletal:     Right lower leg: 1+ Pitting Edema present.     Left lower leg: 1+ Pitting Edema present.  Skin:    General: Skin is warm and dry.     Capillary Refill: Capillary refill takes less than 2 seconds.  Neurological:     Mental Status: She is alert. Mental status is at baseline.     Comments: Able to answer simple questions, follows basic commands. Oriented to self.     Family Communication: Discussed with granddaughter, [name], in person  Disposition: Status is: Inpatient Remains inpatient appropriate because: Significantly improved, at mental baseline, DKA resolved. Suspect can be discharged in 1  to 2 days after optimization of volume status with diuresis and weaning of O2 requirement.  Planned Discharge Destination: Home with Hospice  DVT prophylaxis: heparin  injection 5,000 Units Start: 06/16/24 2200   Time spent: 40 minutes  Author: Duffy Larch, MD 06/22/2024 12:21 PM  For on call review [email or web address].

## 2024-06-22 NOTE — Plan of Care (Signed)

## 2024-06-22 NOTE — Progress Notes (Signed)
 Mobility Specialist Progress Note;   06/22/24 1110  Mobility  Activity Mechanically lifted from bed to chair  Level of Assistance +2 (takes two people)  Assistive Device None  Distance Ambulated (ft) 3 ft  Activity Response Tolerated fair  Mobility Referral Yes  Mobility visit 1 Mobility  Mobility Specialist Start Time (ACUTE ONLY) 1110  Mobility Specialist Stop Time (ACUTE ONLY) 1123  Mobility Specialist Time Calculation (min) (ACUTE ONLY) 13 min   Pt agreeable to mobility. Required MaxA+2 for all safe mobility. VSS throughout. Pt left in chair with all needs met, alarm on. Granddaughter present.   Lauraine Erm Mobility Specialist Please contact via SecureChat or [employer] [phone]

## 2024-06-22 NOTE — Progress Notes (Signed)
 Occupational Therapy Treatment Patient Details Name: Kristen Bowman MRN: [id number] DOB: 12-30-1930 Today's Date: 06/22/2024   History of present illness The pt is a 89yo female presenting 06/15/24 with AMS and lethargy. Pt + for DKA, AKI, possible UTI and acute hypoxic respiratory failure likely from aspiration.  PMH includes: dementia, anemia, DM II, osteoarthritis, HLD, CKD II HTN, IBS, and afib.   OT comments  Pt in chair upon arrival with grand daughter present. STS from chair to RW x 3 trials max A +2 with mod verbal and tactile cues for hand placement and posture. Pt unable to stand for >45 seconds each trial. Pt returned to sitting in chair to participate in grooming/hygiene and UB dressing tasks with mod A for balance/support. Pt required total A+2 using pad underneath pt to scoot hips back in chair. OT will continue to follow acutely to maximize level of function and safety      If plan is discharge home, recommend the following:  Two people to help with walking and/or transfers;Assistance with cooking/housework;Assistance with feeding;Direct supervision/assist for medications management;Direct supervision/assist for financial management;Assist for transportation;Help with stairs or ramp for entrance;A lot of help with bathing/dressing/bathroom   Equipment Recommendations  Hoyer lift;Hospital bed    Recommendations for Other Services      Precautions / Restrictions Precautions Precautions: Fall Recall of Precautions/Restrictions: Impaired       Mobility Bed Mobility               General bed mobility comments: pt in chair    Transfers Overall transfer level: Needs assistance Equipment used: Rolling walker (2 wheels)   Sit to Stand: Max assist, +2 physical assistance           General transfer comment: STS x 3 trials. Pt unable to achieve upright posture or stand for >45 seconds, max A to maintain     Balance Overall balance assessment: Needs  assistance Sitting-balance support: Feet supported, Bilateral upper extremity supported Sitting balance-Leahy Scale: Poor Sitting balance - Comments: mod A to maintain static sitting, fatgues quickly                                   ADL either performed or assessed with clinical judgement   ADL Overall ADL's : Needs assistance/impaired     Grooming: Wash/dry hands;Wash/dry face;Moderate assistance;Sitting           Upper Body Dressing : Maximal assistance;Sitting       Toilet Transfer: Maximal assistance;+2 for physical assistance;+2 for safety/equipment Toilet Transfer Details (indicate cue type and reason): STS Toileting- Clothing Manipulation and Hygiene: Total assistance;Bed level         General ADL Comments: Pt with Poor sitting balance    Extremity/Trunk Assessment Upper Extremity Assessment Upper Extremity Assessment: Generalized weakness   Lower Extremity Assessment Lower Extremity Assessment: Defer to PT evaluation   Cervical / Trunk Assessment Cervical / Trunk Assessment: Other exceptions;Kyphotic    Vision Baseline Vision/History: 1 Wears glasses Ability to See in Adequate Light: 0 Adequate Patient Visual Report: No change from baseline     Perception     Praxis     Communication Communication Communication: Impaired   Cognition Arousal: Alert Behavior During Therapy: [facility] for tasks assessed/performed  Following commands: Impaired Following commands impaired: Follows one step commands with increased time      Cueing   Cueing Techniques: Verbal cues, Gestural cues  Exercises      Shoulder Instructions       General Comments      Pertinent Vitals/ Pain       Pain Assessment Pain Assessment: No/denies pain Pain Descriptors / Indicators: Grimacing Pain Intervention(s): Monitored during session  Home Living                                          Prior  Functioning/Environment              Frequency  Min 2X/week        Progress Toward Goals  OT Goals(current goals can now be found in the care plan section)  Progress towards OT goals: OT to reassess next treatment  Acute Rehab OT Goals Patient Stated Goal: none  Plan      Co-evaluation                 AM-PAC OT 6 Clicks Daily Activity     Outcome Measure   Help from another person eating meals?: A Little Help from another person taking care of personal grooming?: A Lot Help from another person toileting, which includes using toliet, bedpan, or urinal?: Total Help from another person bathing (including washing, rinsing, drying)?: Total Help from another person to put on and taking off regular upper body clothing?: A Lot Help from another person to put on and taking off regular lower body clothing?: Total 6 Click Score: 10    End of Session Equipment Utilized During Treatment: Gait belt;Rolling walker (2 wheels)  OT Visit Diagnosis: Muscle weakness (generalized) (M62.81);Other symptoms and signs involving cognitive function   Activity Tolerance Patient limited by fatigue   Patient Left in chair;with call bell/phone within reach;with chair alarm set;with family/visitor present   Nurse Communication          Time: 8787-8768 OT Time Calculation (min): 19 min  Charges: OT General Charges $OT Visit: 1 Visit OT Treatments $Therapeutic Activity: 8-22 mins    Jacques Karna Loose 06/22/2024, 2:22 PM

## 2024-06-22 NOTE — Plan of Care (Signed)
   Medical records reviewed including progress notes, labs and imaging. Patient is eating well and plan is to continue diuresis, weaning O2.  Goal remains for discharge home with hospice once medically stable.  PMT will continue to follow peripherally for needs.   Thank you for your referral and allowing PMT to assist in [name] care.   Kristen Boer, PA-C Palliative Medicine Team  Team Phone # [phone]   NO CHARGE

## 2024-06-23 ENCOUNTER — Inpatient Hospital Stay (HOSPITAL_COMMUNITY)

## 2024-06-23 DIAGNOSIS — J9601 Acute respiratory failure with hypoxia: Secondary | ICD-10-CM | POA: Diagnosis not present

## 2024-06-23 DIAGNOSIS — F0390 Unspecified dementia without behavioral disturbance: Secondary | ICD-10-CM

## 2024-06-23 DIAGNOSIS — L89303 Pressure ulcer of unspecified buttock, stage 3: Secondary | ICD-10-CM

## 2024-06-23 DIAGNOSIS — R4182 Altered mental status, unspecified: Secondary | ICD-10-CM | POA: Diagnosis not present

## 2024-06-23 LAB — CBC
HCT: 31.3 % — ABNORMAL LOW (ref 36.0–46.0)
Hemoglobin: 9.7 g/dL — ABNORMAL LOW (ref 12.0–15.0)
MCH: 28 pg (ref 26.0–34.0)
MCHC: 31 g/dL (ref 30.0–36.0)
MCV: 90.2 fL (ref 80.0–100.0)
Platelets: 207 K/uL (ref 150–400)
RBC: 3.47 MIL/uL — ABNORMAL LOW (ref 3.87–5.11)
RDW: 15.7 % — ABNORMAL HIGH (ref 11.5–15.5)
WBC: 4.3 K/uL (ref 4.0–10.5)
nRBC: 0 % (ref 0.0–0.2)

## 2024-06-23 LAB — BASIC METABOLIC PANEL WITH GFR
Anion gap: 8 (ref 5–15)
BUN: 27 mg/dL — ABNORMAL HIGH (ref 8–23)
CO2: 24 mmol/L (ref 22–32)
Calcium: 8.1 mg/dL — ABNORMAL LOW (ref 8.9–10.3)
Chloride: 105 mmol/L (ref 98–111)
Creatinine, Ser: 0.81 mg/dL (ref 0.44–1.00)
GFR, Estimated: >60 mL/min (ref >60)
Glucose, Bld: 347 mg/dL — ABNORMAL HIGH (ref 70–99)
Potassium: 4.2 mmol/L (ref 3.5–5.1)
Sodium: 137 mmol/L (ref 135–145)

## 2024-06-23 LAB — GLUCOSE, CAPILLARY
Glucose-Capillary: 296 mg/dL — ABNORMAL HIGH (ref 70–99)
Glucose-Capillary: 137 mg/dL — ABNORMAL HIGH (ref 70–99)
Glucose-Capillary: 121 mg/dL — ABNORMAL HIGH (ref 70–99)
Glucose-Capillary: 70 mg/dL (ref 70–99)

## 2024-06-23 MED ORDER — FUROSEMIDE 10 MG/ML IJ SOLN
40.0000 mg | Freq: Once | INTRAMUSCULAR | Status: AC
  Administered 2024-06-23: 40 mg via INTRAVENOUS
  Filled 2024-06-23: qty 4

## 2024-06-23 MED ORDER — INSULIN GLARGINE-YFGN 100 UNIT/ML ~~LOC~~ SOLN
30.0000 [IU] | Freq: Every day | SUBCUTANEOUS | Status: DC
  Administered 2024-06-24: 30 [IU] via SUBCUTANEOUS
  Filled 2024-06-23 (×2): qty 0.3

## 2024-06-23 NOTE — Plan of Care (Signed)

## 2024-06-23 NOTE — Inpatient Diabetes Management (Signed)
 Inpatient Diabetes Program Recommendations  AACE/ADA: New Consensus Statement on Inpatient Glycemic Control (2015)  Target Ranges:  Prepandial:   less than 140 mg/dL      Peak postprandial:   less than 180 mg/dL (1-2 hours)      Critically ill patients:  140 - 180 mg/dL   Lab Results  Component Value Date   GLUCAP 296 (H) 06/23/2024   HGBA1C 9.4 (H) 06/15/2024    Review of Glycemic Control  Latest Reference Range & Units 06/22/24 08:05 06/22/24 11:37 06/22/24 16:15 06/22/24 20:42 06/23/24 07:41  Glucose-Capillary 70 - 99 mg/dL 648 (H) 758 (H) 631 (H) 262 (H) 296 (H)   Diabetes history: DM 2  Outpatient Diabetes medications:  Metformin  750 mg daily Current orders for Inpatient glycemic control:  Novolog  0-9 units tid with meals and HS Novolog  5 units tid with meals Semglee  25 units daily Inpatient Diabetes Program Recommendations:    May consider increasing Semglee  to 30 units daily.   Thanks,  Randall Bullocks, RN, BC-ADM Inpatient Diabetes Coordinator Pager [phone]  (8a-5p)

## 2024-06-23 NOTE — Evaluation (Signed)
 Clinical/Bedside Swallow Evaluation Patient Details  Name: Kristen Bowman MRN: [id number] Date of Birth: Apr 12, 1931  Today's Date: 06/23/2024 Time: SLP Start Time (ACUTE ONLY): 1441 SLP Stop Time (ACUTE ONLY): 1500 SLP Time Calculation (min) (ACUTE ONLY): 19 min  Past Medical History:  Past Medical History:  Diagnosis Date   Anemia    Anxiety states    Diabetes mellitus    T2DM with renal manifestations   Generalized osteoarthrosis, unspecified site    Hematuria    Hyperlipidemia    mixed   Hypertension    Irritable bowel syndrome    Other specified cardiac dysrhythmias(427.89)    atrial fibrilation   Panic disorder    Polymyalgia rheumatica    Proteinuria 05/29/10   Type II or unspecified type diabetes mellitus with renal manifestations, uncontrolled(250.42) 05/29/10   CKD II (mild)   Past Surgical History:  Past Surgical History:  Procedure Laterality Date   CATARACT EXTRACTION     right   COLONOSCOPY N/A 11/04/2012   Procedure: COLONOSCOPY;  Surgeon: Elsie Cree, MD;  Location: [facility];  Service: Endoscopy;  Laterality: N/A;   ESOPHAGOGASTRODUODENOSCOPY N/A 11/04/2012   Procedure: ESOPHAGOGASTRODUODENOSCOPY (EGD);  Surgeon: Elsie Cree, MD;  Location: [facility];  Service: Endoscopy;  Laterality: N/A;   TUBAL LIGATION     HPI:  Patient is a 89 y.o. female with past medical history of diabetes mellitus type 2, hypertension, history of E. Coli UTI and associated encephalopahty, right bundle branch block, chronic anemia, chronic kidney disease stage II, anemia, dementia, depression independent and oriented at baseline come from AMS and hyperglycemia. Patient was receiving respite care at a SNF and was admitted to the hospital on 10/21 secondary to AMS hyperglycemia (daughter suspects patient was not being given her insulin ) and altered mental status and UA was positive for UTI. Also with PNA, suspected aspiration. Per RN note from 10/22, during oral care, suspected  emesis in oral cavity and around dentures. Bedside swallow evaluation ordered to assess patient's swallow. Patient placed on dys 2 nectar thick diet. SLP signed off. New swallow evaluation ordered on 06/23/24 as family is requesting a change to puree diet.    Assessment / Plan / Recommendation  Clinical Impression  Patient presents with clinical s/s of dysphagia as per this BSE. She was awake, alert, and participated fully in this evaluation. She was edentulous but per past reports, her dentures are at home. SLP assessed patient's swallow via PO's of: thin liquids, nectar thick liquids, and puree solids. Patient exhibited a suspected delayed swallow as well as dealyed cough with both thin and nectar thick liquids. Patient's swallow initiation was timely and no overt s/s aspiration observed with bites of puree. SLP is recommending to initiate a PO diet of Dys 1 (puree) with nectar thick liquids. SLP scheduling an MBS to further evaluate patient's swallow and rule out aspiration. MBS will help provide recommendations for patient returning home. SLP Visit Diagnosis: Dysphagia, unspecified (R13.10)    Aspiration Risk  Mild aspiration risk    Diet Recommendation Dysphagia 1 (Puree);Nectar-thick liquid    Liquid Administration via: Spoon;Cup;Straw Medication Administration: Crushed with puree Supervision: Full supervision/cueing for compensatory strategies;Staff to assist with self feeding Compensations: Slow rate;Small sips/bites;Other (Comment) Postural Changes: Seated upright at 90 degrees;Remain upright for at least 30 minutes after po intake    Other  Recommendations Oral Care Recommendations: Oral care BID     Assistance Recommended at Discharge    Functional Status Assessment Patient has had a recent decline in  their functional status and demonstrates the ability to make significant improvements in function in a reasonable and predictable amount of time.  Frequency and Duration min 2x/week   1 week       Prognosis Prognosis for improved oropharyngeal function: Fair Barriers to Reach Goals: Cognitive deficits      Swallow Study   General Date of Onset: 06/23/24 HPI: Patient is a 89 y.o. female with past medical history of diabetes mellitus type 2, hypertension, history of E. Coli UTI and associated encephalopahty, right bundle branch block, chronic anemia, chronic kidney disease stage II, anemia, dementia, depression independent and oriented at baseline come from AMS and hyperglycemia. Patient was receiving respite care at a SNF and was admitted to the hospital on 10/21 secondary to AMS hyperglycemia (daughter suspects patient was not being given her insulin ) and altered mental status and UA was positive for UTI. Also with PNA, suspected aspiration. Per RN note from 10/22, during oral care, suspected emesis in oral cavity and around dentures. Bedside swallow evaluation ordered to assess patient's swallow. Patient placed on dys 2 nectar thick diet. SLP signed off. New swallow evaluation ordered on 06/23/24 as family is requesting a change to puree diet. Type of Study: Bedside Swallow Evaluation Diet Prior to this Study: Dysphagia 2 (finely chopped);Mildly thick liquids (Level 2, nectar thick) Temperature Spikes Noted: No Respiratory Status: Nasal cannula History of Recent Intubation: No Behavior/Cognition: Alert;Cooperative;Pleasant mood Oral Care Completed by SLP: No Oral Cavity - Dentition: Dentures, not available Self-Feeding Abilities: Total assist Patient Positioning: Upright in bed    Oral/Motor/Sensory Function Overall Oral Motor/Sensory Function: Within functional limits   Ice Chips     Thin Liquid Thin Liquid: Impaired Presentation: Straw Pharyngeal  Phase Impairments: Suspected delayed Swallow;Cough - Delayed    Nectar Thick Nectar Thick Liquid: Impaired Presentation: Straw Pharyngeal Phase Impairments: Cough - Delayed   Honey Thick     Puree Puree: Within  functional limits Presentation: Spoon   Solid           Damien Hy  Graduate SLP Clinican

## 2024-06-23 NOTE — Care Management Important Message (Signed)
 Important Message  Patient Details  Name: Kristen Bowman MRN: [id number] Date of Birth: 20-Nov-1930   Important Message Given:  Yes - Medicare IM     [name] 06/23/2024, 11:10 AM

## 2024-06-23 NOTE — Progress Notes (Signed)
 [facility]                                                                               Kristen Bowman, is a 89 y.o. female, DOB - 09-05-1930, FMW:[id number] Admit date - 06/15/2024    Outpatient Primary MD for the patient is Valentin Skates, DO  LOS - 8  days    Brief summary    The patient is a 89 year old female on hospice care with a PMHx of dementia, CKD stage II, T2DM, HTN, anemia who presented to the ED on 06/15/2024 from [facility] for increased lethargy, hypoxia, and hyperglycemia, found to be in DKA with acute hypoxic respiratory failure and sepsis secondary to aspiration pneumonia.    Assessment & Plan    Assessment and Plan:    # Acute hypoxic respiratory failure - improving - Presented with SpO2 of 88% on RA with tachypnea in the 20s. Initially, likely secondary to aspiration pneumonia.  - She is still requiring supplemental O2 at this time, suspect she is somewhat volume overloaded (net +3.9L since admission) given amount of fluids she received for DKA, noted mild LE edema and bilateral crackles on exam - slightly fluid overloaded, was given a dose of IV  Lasix with 1200 ml out yesterday.  -she remains on oxygen, led edema improving with IV lasix. But her repeat CXR shows worsening of the left lung and right lung base opacities, suspect ongoing aspiration despite change in diet and antibiotics.  - will try one more dose of IV lasix today and recheck CXR in 24 hourw.     # Sepsis  - Presented with leukocytosis to 12.6, tachypnea 27, lactic acid elevated to 3.4 with AMS and AKI, with concern for underlying aspiration pneumonia and UTI - resolved.    # Aspiration pneumonia - Concern for aspiration pneumonia given encephalopathy with findings concerning for old emesis on oral care  - CXR (10/22) showing new confluent new retrocardiac opacity on the left compatible with new or increased left lower lung collapse or consolidation - RPP negative -  Procalcitonin 0.21 on 10/22 - she remains afebrile, with WBC count wnl.  - but her repeat CXR shows worsening of the opacities, suspect continual aspiration .  - discussed with the patient's grand daughter over the phone.    #Hypertension Better controlled today.  Continue with amlodipine , avapro and metoprolol  75 mg BID.    # Hypernatremia - resolved - Initially elevated Na in the setting of dehydration and osmotic diuresis from DKA then resolved. - Na 141 today - Encouraged continued free water  intake    # Dysphagia - Patient presented with encephalopathy secondary to DKA complicated by aspiration pneumonia. Her mental status has improved. However, she remains dysphagic. SLP is following and has recommended a dysphagia 1 diet with nectar thick liquids. - Continues to do very well with 1:1 feeding. Family not interested in Cortrak anymore.   # DKA - resolved - Presented with hyperglycemia to 770s in the setting of not receiving insulin  at SNF during 5-day stay. BhB elevated to > 8 on admission - BhB wnl - CBG (last 3)  Recent Labs    06/23/24 0741  06/23/24 1245 06/23/24 1653  GLUCAP 296* 137* 121*   Resume SSI and long acting insulin .    #Lactic acidosis - resolved - Episodes of recurrent lactic acidosis, possibly secondary to aspiration pneumonia and dehydration -  resolved.    # High anion gap metabolic acidosis - resolved - Likely secondary to lactic acidosis and DKA - Continue management of underlying causes as above   # Possible UTI - UA concerning for UTI. Patient encephalopathic and unable to reliably communicate symptoms - History of ESBL E. Coli - Urine culture showing greater than 100,000 colonies of multiple species and suggested recollection.  However, patient receiving antibiotics with clinical improvement, so repeat UA/UCx futile at this point - Completed 7 days of meropenem   # Hypokalemia - resolved   #Acute metabolic encephalopathy  - Likely secondary  to acute illness caused by aspiration pneumonia in the setting of underlying dementia - she is oriented to person only , when awake.  - discussed about getting another CT  head, which would not change the treatment at this time.    #AKI on CKD stage IIIa - resolved - Creatinine elevated 2.74 on admission, baseline around 0.9 - Likely prerenal in the setting of dehydration from vomiting and osmotic diuresis - Improved with IVFs - creatinine stable around 0.8   #Elevated troponins - High-sensitivity troponins peaked at 192 - Likely secondary to acute illness, aspiration pneumonia, DKA  - Echo showed LVEF 70 to 75% with no regional wall motion normalities.  Noted severe asymmetric left ventricular hypertrophy of the basal septal segment - Cardiology evaluated - indicated unlikely ACS if no abnormalities on echo - she was started on heparin  gtt and discontinued on 10/22.  - currently chest pain free.    #Dementia #Anxiety #Depression - Home meds include sertraline  50 mg daily, Klonopin  0.5 mg daily, Depakote  125 mg twice daily, donepezil  5 mg daily, trazodone  50 mg nightly, risperidone  0.5 mg nightly. Initially held due to AMS and NPO.  Patient's grand daughter is concerned that patient remains sleepy most of the time, we discussed about decreasing the dose of clonazepam  but family does not  want to do it as she has been on these meds for more than 10 years.       RN Pressure Injury Documentation: Wound 06/23/24 1430 Pressure Injury Coccyx Mid Stage 3 -  Full thickness tissue loss. Subcutaneous fat may be visible but bone, tendon or muscle are NOT exposed. (Active)     Wound 06/23/24 1430 Pressure Injury Heel Left Deep Tissue Pressure Injury - Purple or maroon localized area of discolored intact skin or blood-filled blister due to damage of underlying soft tissue from pressure and/or shear. (Active)    Malnutrition Type:  Nutrition Problem: Inadequate oral intake Etiology: chronic  illness (dementia)   Malnutrition Characteristics:  Signs/Symptoms: per patient/family report   Nutrition Interventions:  Interventions: Ensure Enlive (each supplement provides 350kcal and 20 grams of protein), Tube feeding  Estimated body mass index is 24.55 kg/m as calculated from the following:   Height as of this encounter: 5' 6 (1.676 m).   Weight as of this encounter: 69 kg.  Code Status: DNR limited.  DVT Prophylaxis:  heparin  injection 5,000 Units Start: 06/16/24 2200   Level of Care: Level of care: Progressive Family Communication: none at bedside, discussed with grand daughter over the  phone.   Disposition Plan:     Remains inpatient appropriate:  pending clinical improvement.   Procedures:  None.   Consultants:  None.   Antimicrobials:   Anti-infectives (From admission, onward)    Start     Dose/Rate Route Frequency Ordered Stop   06/17/24 2200  vancomycin  (VANCOCIN ) IVPB 1000 mg/200 mL premix  Status:  Discontinued        1,000 mg 200 mL/hr over 60 Minutes Intravenous Every 48 hours 06/16/24 0935 06/16/24 1810   06/16/24 0400  meropenem (MERREM) 1 g in sodium chloride  0.9 % 100 mL IVPB        1 g 200 mL/hr over 30 Minutes Intravenous Every 12 hours 06/16/24 0335 06/22/24 2106   06/15/24 1900  vancomycin  (VANCOREADY) IVPB 1500 mg/300 mL        1,500 mg 150 mL/hr over 120 Minutes Intravenous  Once 06/15/24 1853 06/15/24 2357   06/15/24 1853  vancomycin  variable dose per unstable renal function (pharmacist dosing)  Status:  Discontinued         Does not apply See admin instructions 06/15/24 1853 06/16/24 0935   06/15/24 1845  cefTRIAXone  (ROCEPHIN ) 2 g in sodium chloride  0.9 % 100 mL IVPB  Status:  Discontinued       Note to Pharmacy: Please confirm patient has had blood and urine cultures prior to antibiotic administration.   2 g 200 mL/hr over 30 Minutes Intravenous Every 24 hours 06/15/24 1836 06/16/24 0335        Medications  Scheduled  Meds:  amLODipine   10 mg Oral Daily   clonazePAM   0.5 mg Oral Daily   divalproex   250 mg Oral BID   donepezil   5 mg Oral QHS   feeding supplement  237 mL Oral BID BM   heparin  injection (subcutaneous)  5,000 Units Subcutaneous Q8H   insulin  aspart  0-5 Units Subcutaneous QHS   insulin  aspart  0-9 Units Subcutaneous TID WC   insulin  aspart  5 Units Subcutaneous TID WC   [START ON 06/24/2024] insulin  glargine-yfgn  30 Units Subcutaneous Daily   irbesartan  300 mg Oral Daily   metoprolol  tartrate  75 mg Oral BID   nystatin  5 mL Oral QID   thiamine  100 mg Oral Daily   Continuous Infusions: PRN Meds:.acetaminophen , dextrose , hydrALAZINE , labetalol     Subjective:   Kristen Bowman was seen and examined today.  Sleepy, but wakes up to calling her name.   Objective:   Vitals:   06/23/24 0400 06/23/24 0742 06/23/24 1243 06/23/24 1654  BP: (!) 137/48 (!) 135/50 (!) 135/51 (!) 131/48  Pulse: 81     Resp: 20 19    Temp: 99 F (37.2 C) 98.7 F (37.1 C) 97.7 F (36.5 C) 97.9 F (36.6 C)  TempSrc: Oral Axillary Axillary Axillary  SpO2: 94%  91%   Weight:      Height:        Intake/Output Summary (Last 24 hours) at 06/23/2024 1719 Last data filed at 06/23/2024 9072 Gross per 24 hour  Intake 358 ml  Output 700 ml  Net -342 ml   Filed Weights   06/15/24 1552 06/15/24 1953 06/21/24 1113  Weight: 74.8 kg 62.8 kg 69 kg     Exam General: Alert and oriented x 3, NAD Cardiovascular: S1 S2 auscultated, no murmurs, RRR Respiratory: Clear to auscultation bilaterally, no wheezing, rales or rhonchi Gastrointestinal: Soft, nontender, nondistended, + bowel sounds Ext: no pedal edema bilaterally Neuro: alert, oriented to person only.  SKIN  stage 3 pressure injury on the bottom..    Data Reviewed:  I have personally reviewed following labs and imaging  studies   CBC Lab Results  Component Value Date   WBC 4.3 06/23/2024   RBC 3.47 (L) 06/23/2024   HGB 9.7 (L) 06/23/2024   HCT  31.3 (L) 06/23/2024   MCV 90.2 06/23/2024   MCH 28.0 06/23/2024   PLT 207 06/23/2024   MCHC 31.0 06/23/2024   RDW 15.7 (H) 06/23/2024   LYMPHSABS 1.1 06/15/2024   MONOABS 1.0 06/15/2024   EOSABS 0.0 06/15/2024   BASOSABS 0.0 06/15/2024     Last metabolic panel Lab Results  Component Value Date   NA 137 06/23/2024   K 4.2 06/23/2024   CL 105 06/23/2024   CO2 24 06/23/2024   BUN 27 (H) 06/23/2024   CREATININE 0.81 06/23/2024   GLUCOSE 347 (H) 06/23/2024   GFRNONAA >60 06/23/2024   GFRAA 55 (L) 01/26/2020   CALCIUM  8.1 (L) 06/23/2024   PHOS 2.3 (L) 06/18/2024   PROT 5.7 (L) 06/15/2024   PROT 5.7 (L) 06/15/2024   ALBUMIN  2.5 (L) 06/15/2024   ALBUMIN  2.5 (L) 06/15/2024   BILITOT 2.3 (H) 06/15/2024   BILITOT 2.2 (H) 06/15/2024   ALKPHOS 65 06/15/2024   ALKPHOS 65 06/15/2024   AST 14 (L) 06/15/2024   AST 14 (L) 06/15/2024   ALT 11 06/15/2024   ALT 11 06/15/2024   ANIONGAP 8 06/23/2024    CBG (last 3)  Recent Labs    06/23/24 0741 06/23/24 1245 06/23/24 1653  GLUCAP 296* 137* 121*      Coagulation Profile: No results for input(s): INR, PROTIME in the last 168 hours.   Radiology Studies: DG CHEST PORT 1 VIEW Result Date: 06/23/2024 CLINICAL DATA:  Follow-up left lower lobe infection. EXAM: PORTABLE CHEST 1 VIEW COMPARISON:  Radiograph 06/20/2024, remote chest CT reviewed. FINDINGS: Worsening left lung base opacity with developing pleural effusion. Stable cardiomegaly. Unchanged mediastinal contours with aortic atherosclerosis. Mild patchy opacity at the right lung base. No pneumothorax. IMPRESSION: 1. Worsening left lung base opacity with developing pleural effusion. 2. Mild patchy opacity at the right lung base, atelectasis versus pneumonia. Electronically Signed   By: Andrea Gasman M.D.   On: 06/23/2024 15:01       Elgie Butter M.D. [facility] 06/23/2024, 5:19 PM  Available via Epic secure chat 7am-7pm After 7 pm, please refer to night  coverage provider listed on amion.

## 2024-06-24 ENCOUNTER — Inpatient Hospital Stay (HOSPITAL_COMMUNITY)

## 2024-06-24 DIAGNOSIS — R4182 Altered mental status, unspecified: Secondary | ICD-10-CM | POA: Diagnosis not present

## 2024-06-24 DIAGNOSIS — J690 Pneumonitis due to inhalation of food and vomit: Secondary | ICD-10-CM

## 2024-06-24 DIAGNOSIS — F0390 Unspecified dementia without behavioral disturbance: Secondary | ICD-10-CM | POA: Diagnosis not present

## 2024-06-24 DIAGNOSIS — J9601 Acute respiratory failure with hypoxia: Secondary | ICD-10-CM | POA: Diagnosis not present

## 2024-06-24 LAB — GLUCOSE, CAPILLARY
Glucose-Capillary: 108 mg/dL — ABNORMAL HIGH (ref 70–99)
Glucose-Capillary: 137 mg/dL — ABNORMAL HIGH (ref 70–99)
Glucose-Capillary: 145 mg/dL — ABNORMAL HIGH (ref 70–99)
Glucose-Capillary: 149 mg/dL — ABNORMAL HIGH (ref 70–99)
Glucose-Capillary: 47 mg/dL — ABNORMAL LOW (ref 70–99)
Glucose-Capillary: 91 mg/dL (ref 70–99)

## 2024-06-24 LAB — CBC
HCT: 29.9 % — ABNORMAL LOW (ref 36.0–46.0)
Hemoglobin: 9.6 g/dL — ABNORMAL LOW (ref 12.0–15.0)
MCH: 28.2 pg (ref 26.0–34.0)
MCHC: 32.1 g/dL (ref 30.0–36.0)
MCV: 87.7 fL (ref 80.0–100.0)
Platelets: 218 K/uL (ref 150–400)
RBC: 3.41 MIL/uL — ABNORMAL LOW (ref 3.87–5.11)
RDW: 15.4 % (ref 11.5–15.5)
WBC: 5.6 K/uL (ref 4.0–10.5)
nRBC: 0 % (ref 0.0–0.2)

## 2024-06-24 LAB — BASIC METABOLIC PANEL WITH GFR
Anion gap: 12 (ref 5–15)
BUN: 21 mg/dL (ref 8–23)
CO2: 26 mmol/L (ref 22–32)
Calcium: 8.2 mg/dL — ABNORMAL LOW (ref 8.9–10.3)
Chloride: 98 mmol/L (ref 98–111)
Creatinine, Ser: 0.86 mg/dL (ref 0.44–1.00)
GFR, Estimated: >60 mL/min (ref >60)
Glucose, Bld: 110 mg/dL — ABNORMAL HIGH (ref 70–99)
Potassium: 4 mmol/L (ref 3.5–5.1)
Sodium: 136 mmol/L (ref 135–145)

## 2024-06-24 NOTE — Plan of Care (Signed)
    Referral received for [name] :goals of care discussion. Chart reviewed and updates received from SLP regarding today's MBS. Worsening dysphagia. Patient's daughter will discuss with granddaughter about how to proceed once at home with hospice.   PMT will reach out tomorrow to follow up on family conversations and provide additional support.  Thank you for your referral and allowing PMT to assist in [name] care.   Shakira Los, PA-C Palliative Medicine Team  Team Phone # [phone]   NO CHARGE

## 2024-06-24 NOTE — Progress Notes (Signed)
 Physical Therapy Treatment Patient Details Name: Kristen Bowman MRN: [id number] DOB: [birth year] Today's Date: 06/24/2024   History of Present Illness The pt is a 89yo female presenting 06/15/24 with AMS and lethargy. Pt + for DKA, AKI, possible UTI and acute hypoxic respiratory failure likely from aspiration.  PMH includes: dementia, anemia, DM II, osteoarthritis, HLD, CKD II HTN, IBS, and afib.    PT Comments  Pt is very slowly progressing towards goals. Pt is limited due to cognition demonstrating good strength but poor motor planning and initiation of functional movements. Pt currently Max A +2 for bed mobility this AM, Max A +2 for sit to stand and stand pivot transfer. Due to pt current functional status, home set up and available assistance at home recommending skilled physical therapy services < 3 hours/day in order to address strength, balance and functional mobility to decrease risk for falls, injury, immobility, skin break down and re-hospitalization.      If plan is discharge home, recommend the following: A lot of help with walking and/or transfers;A lot of help with bathing/dressing/bathroom;Assistance with cooking/housework;Assist for transportation   Can travel by private vehicle     No  Equipment Recommendations  Wheelchair cushion (measurements PT);Wheelchair (measurements PT);Hoyer lift;Hospital bed       Precautions / Restrictions Precautions Precautions: Fall Recall of Precautions/Restrictions: Impaired Restrictions Weight Bearing Restrictions Per Provider Order: No     Mobility  Bed Mobility Overal bed mobility: Needs Assistance Bed Mobility: Supine to Sit     Supine to sit: Max assist, +2 for physical assistance          Transfers Overall transfer level: Needs assistance Equipment used: 2 person hand held assist Transfers: Sit to/from Stand, Bed to chair/wheelchair/BSC Sit to Stand: Max assist, +2 physical assistance Stand pivot transfers: Max assist,  +2 physical assistance, +2 safety/equipment         General transfer comment: pt performed sit to stand 2x during session at Max A to get to standing. Able to stand 15 seconds for MD to look at sacral area.    Ambulation/Gait       General Gait Details: unable to progress today. Pt not able to stand longer than 15 seconds     Balance Overall balance assessment: Needs assistance Sitting-balance support: Feet supported, Bilateral upper extremity supported Sitting balance-Leahy Scale: Poor Sitting balance - Comments: mod A to brief periods of Min A  maintain static sitting, fatigues quickly Postural control: Posterior lean, Left lateral lean Standing balance support: Bilateral upper extremity supported, During functional activity Standing balance-Leahy Scale: Zero Standing balance comment: dependent on external support      Communication Communication Communication: Impaired Factors Affecting Communication: Difficulty expressing self  Cognition Arousal: Alert Behavior During Therapy: Flat affect   PT - Cognitive impairments: No family/caregiver present to determine baseline, Initiation, Problem solving, Safety/Judgement, History of cognitive impairments     Following commands: Impaired Following commands impaired: Follows one step commands inconsistently    Cueing Cueing Techniques: Verbal cues, Gestural cues, Tactile cues, Visual cues     General Comments General comments (skin integrity, edema, etc.): O2 sats/HR remained stalbe on 3L O2 via [place]      Pertinent Vitals/Pain Pain Assessment Pain Assessment: Faces Faces Pain Scale: Hurts little more Breathing: normal Negative Vocalization: occasional moan/groan, low speech, negative/disapproving quality Facial Expression: sad, frightened, frown Body Language: relaxed Consolability: distracted or reassured by voice/touch PAINAD Score: 3 Pain Location: sacral area Pain Descriptors / Indicators: Grimacing Pain  Intervention(s): Monitored  during session, Repositioned     PT Goals (current goals can now be found in the care plan section) Acute Rehab PT Goals Patient Stated Goal: none stated Time For Goal Achievement: 07/02/24 Potential to Achieve Goals: Fair Progress towards PT goals: Progressing toward goals    Frequency    Min 1X/week      PT Plan  Continue with current POC        AM-PAC PT 6 Clicks Mobility   Outcome Measure  Help needed turning from your back to your side while in a flat bed without using bedrails?: A Lot Help needed moving from lying on your back to sitting on the side of a flat bed without using bedrails?: Total Help needed moving to and from a bed to a chair (including a wheelchair)?: Total Help needed standing up from a chair using your arms (e.g., wheelchair or bedside chair)?: Total Help needed to walk in hospital room?: Total Help needed climbing 3-5 steps with a railing? : Total 6 Click Score: 7    End of Session Equipment Utilized During Treatment: Oxygen;Gait belt Activity Tolerance: Patient limited by fatigue;Patient tolerated treatment well Patient left: with call bell/phone within reach;in chair;with chair alarm set Nurse Communication: Mobility status PT Visit Diagnosis: Other abnormalities of gait and mobility (R26.89);Muscle weakness (generalized) (M62.81)     Time: 8894-8879 PT Time Calculation (min) (ACUTE ONLY): 15 min  Charges:    $Therapeutic Activity: 8-22 mins PT General Charges $$ ACUTE PT VISIT: 1 Visit                     Dorothyann Maier, DPT, CLT  Acute Rehabilitation Services Office: [phone] (Secure chat preferred)    Dorothyann VEAR Maier 06/24/2024, 11:31 AM

## 2024-06-24 NOTE — Progress Notes (Addendum)
 SLP had a long discussion with patient's daughter over the course of 2 phone calls. Explained results of MBS, potential diet recommendations, prognosis and plan. All questions answered. Daughter would like to discuss situation with patient's grandaughter before making a decision and keep diet order as is. Recommend palliative care to assist with decision making as well. Will call palliative care and f/u next date.   Dnasia Gauna MA, CCC-SLP

## 2024-06-24 NOTE — Progress Notes (Signed)
 [facility]                                                                               Saiya Crist, is a 89 y.o. female, DOB - 06-06-1931, FMW:[id number] Admit date - 06/15/2024    Outpatient Primary MD for the patient is Valentin Skates, DO  LOS - 9  days    Brief summary    The patient is a 89 year old female on hospice care with a PMHx of dementia, CKD stage II, T2DM, HTN, anemia who presented to the ED on 06/15/2024 from [facility] for increased lethargy, hypoxia, and hyperglycemia, found to be in DKA with acute hypoxic respiratory failure and sepsis secondary to aspiration pneumonia.    Assessment & Plan    Assessment and Plan:    # Acute hypoxic respiratory failure - improving - Presented with SpO2 of 88% on RA with tachypnea in the 20s. Initially, likely secondary to aspiration pneumonia.  - She is still requiring supplemental O2 at this time, suspect she is somewhat volume overloaded (net +3.9L since admission) given amount of fluids she received for DKA, noted mild LE edema and bilateral crackles on exam - slightly fluid overloaded, was given 2 doses of IV lasix and 1600 ml urine out yesterday.  -she remains on oxygen, led edema improving with IV lasix.  But her repeat CXR shows worsening of the left lung and right lung base opacities, suspect ongoing aspiration despite change in diet and antibiotics.  - SLP eval done, MBS done, showing worsening of her dysphagia due to her cognitive impairment.     # Sepsis  - Presented with leukocytosis to 12.6, tachypnea 27, lactic acid elevated to 3.4 with AMS and AKI, with concern for underlying aspiration pneumonia and UTI - resolved.    # Aspiration pneumonia - Concern for aspiration pneumonia given encephalopathy with findings concerning for old emesis on oral care  - CXR (10/22) showing new confluent new retrocardiac opacity on the left compatible with new or increased left lower lung collapse or  consolidation - RPR negative - Procalcitonin 0.21 on 10/22 - she remains afebrile, with WBC count wnl.  - but her repeat CXR shows worsening of the opacities, suspect continual aspiration .  - discussed with the patient's grand daughter over the phone.    #Hypertension Well controlled.  Continue with amlodipine , avapro and metoprolol  75 mg BID.    # Hypernatremia - resolved - Initially elevated Na in the setting of dehydration and osmotic diuresis from DKA then resolved. Sodium wnl.    # Dysphagia - Patient presented with encephalopathy secondary to DKA complicated by aspiration pneumonia. Her mental status has improved. However, she remains dysphagic. SLP is following and has recommended a dysphagia 1 diet with nectar thick liquids. -  Family not interested in Cortrak anymore. MBS done, and showed worsening dysphagia.    # DKA - resolved - Presented with hyperglycemia to 770s in the setting of not receiving insulin  at SNF during 5-day stay. BhB elevated to > 8 on admission - BhB wnl - CBG (last 3)  Recent Labs    06/23/24 2117 06/24/24 0803 06/24/24 1212  GLUCAP 70 149*  145*   Resume SSI and long acting insulin .    #Lactic acidosis - resolved - Episodes of recurrent lactic acidosis, possibly secondary to aspiration pneumonia and dehydration -  resolved.    # High anion gap metabolic acidosis - resolved - Likely secondary to lactic acidosis and DKA - Continue management of underlying causes as above   # Possible UTI - UA concerning for UTI. Patient encephalopathic and unable to reliably communicate symptoms - History of ESBL E. Coli - Urine culture showing greater than 100,000 colonies of multiple species and suggested recollection.  However, patient receiving antibiotics with clinical improvement, so repeat UA/UCx futile at this point - Completed 7 days of meropenem   # Hypokalemia - resolved   #Acute metabolic encephalopathy  - Likely secondary to acute illness  caused by aspiration pneumonia in the setting of underlying dementia - she is oriented to person only , when awake.  - discussed about getting another CT  head, which would not change the treatment at this time.    #AKI on CKD stage IIIa - resolved - Creatinine elevated 2.74 on admission, baseline around 0.9 - Likely prerenal in the setting of dehydration from vomiting and osmotic diuresis - Improved with IVFs - creatinine stable around 0.8   #Elevated troponins - High-sensitivity troponins peaked at 192 - Likely secondary to acute illness, aspiration pneumonia, DKA  - Echo showed LVEF 70 to 75% with no regional wall motion normalities.  Noted severe asymmetric left ventricular hypertrophy of the basal septal segment - Cardiology evaluated - indicated unlikely ACS if no abnormalities on echo - she was started on heparin  gtt and discontinued on 10/22.  - currently chest pain free.    #Dementia #Anxiety #Depression - Home meds include sertraline  50 mg daily, Klonopin  0.5 mg daily, Depakote  125 mg twice daily, donepezil  5 mg daily, trazodone  50 mg nightly, risperidone  0.5 mg nightly. Initially held due to AMS and NPO.  Patient's grand daughter is concerned that patient remains sleepy most of the time, we discussed about decreasing the dose of clonazepam  but family does not  want to do it as she has been on these meds for more than 10 years.      RN Pressure Injury Documentation: Wound 06/23/24 1430 Pressure Injury Coccyx Mid Stage 3 -  Full thickness tissue loss. Subcutaneous fat may be visible but bone, tendon or muscle are NOT exposed. (Active)     Wound 06/23/24 1430 Pressure Injury Heel Left Deep Tissue Pressure Injury - Purple or maroon localized area of discolored intact skin or blood-filled blister due to damage of underlying soft tissue from pressure and/or shear. (Active)  Wound care consulted and recommendations given.   Malnutrition Type:  Nutrition Problem: Inadequate oral  intake Etiology: chronic illness (dementia)   Malnutrition Characteristics:  Signs/Symptoms: per patient/family report   Nutrition Interventions:  Interventions: Ensure Enlive (each supplement provides 350kcal and 20 grams of protein), Tube feeding  Estimated body mass index is 24.55 kg/m as calculated from the following:   Height as of this encounter: 5' 6 (1.676 m).   Weight as of this encounter: 69 kg.  Code Status: DNR limited.  DVT Prophylaxis:  heparin  injection 5,000 Units Start: 06/16/24 2200   Level of Care: Level of care: Progressive Family Communication: none at bedside, discussed with grand daughter over the  phone.   Disposition Plan:     Remains inpatient appropriate:  home with hospice.   Procedures:  None.   Consultants:   None.  Antimicrobials:   Anti-infectives (From admission, onward)    Start     Dose/Rate Route Frequency Ordered Stop   06/17/24 2200  vancomycin  (VANCOCIN ) IVPB 1000 mg/200 mL premix  Status:  Discontinued        1,000 mg 200 mL/hr over 60 Minutes Intravenous Every 48 hours 06/16/24 0935 06/16/24 1810   06/16/24 0400  meropenem (MERREM) 1 g in sodium chloride  0.9 % 100 mL IVPB        1 g 200 mL/hr over 30 Minutes Intravenous Every 12 hours 06/16/24 0335 06/22/24 2106   06/15/24 1900  vancomycin  (VANCOREADY) IVPB 1500 mg/300 mL        1,500 mg 150 mL/hr over 120 Minutes Intravenous  Once 06/15/24 1853 06/15/24 2357   06/15/24 1853  vancomycin  variable dose per unstable renal function (pharmacist dosing)  Status:  Discontinued         Does not apply See admin instructions 06/15/24 1853 06/16/24 0935   06/15/24 1845  cefTRIAXone  (ROCEPHIN ) 2 g in sodium chloride  0.9 % 100 mL IVPB  Status:  Discontinued       Note to Pharmacy: Please confirm patient has had blood and urine cultures prior to antibiotic administration.   2 g 200 mL/hr over 30 Minutes Intravenous Every 24 hours 06/15/24 1836 06/16/24 0335         Medications  Scheduled Meds:  amLODipine   10 mg Oral Daily   clonazePAM   0.5 mg Oral Daily   divalproex   250 mg Oral BID   donepezil   5 mg Oral QHS   feeding supplement  237 mL Oral BID BM   heparin  injection (subcutaneous)  5,000 Units Subcutaneous Q8H   insulin  aspart  0-5 Units Subcutaneous QHS   insulin  aspart  0-9 Units Subcutaneous TID WC   insulin  aspart  5 Units Subcutaneous TID WC   insulin  glargine-yfgn  30 Units Subcutaneous Daily   irbesartan  300 mg Oral Daily   metoprolol  tartrate  75 mg Oral BID   nystatin  5 mL Oral QID   Continuous Infusions: PRN Meds:.acetaminophen , dextrose , hydrALAZINE , labetalol     Subjective:   Kristen Bowman was seen and examined today.   More alert today and working with PT.   Objective:   Vitals:   06/23/24 2330 06/24/24 0805 06/24/24 1157 06/24/24 1213  BP: (!) 144/72 (!) 151/54 (!) 150/71 (!) 146/67  Pulse: 71 78 (!) 107   Resp: 16 20    Temp: 99.2 F (37.3 C) 98.2 F (36.8 C)  97.8 F (36.6 C)  TempSrc: Oral Axillary  Oral  SpO2: 99% 93%    Weight:      Height:        Intake/Output Summary (Last 24 hours) at 06/24/2024 1413 Last data filed at 06/24/2024 1024 Gross per 24 hour  Intake 240 ml  Output 2025 ml  Net -1785 ml   Filed Weights   06/15/24 1552 06/15/24 1953 06/21/24 1113  Weight: 74.8 kg 62.8 kg 69 kg     Exam General exam: ill appearing elderly woman, not in distress.  Respiratory system: air entry fair, diminished at bases.  Cardiovascular system: S1 & S2 heard, RRR.  Gastrointestinal system: Abdomen is soft BS+ Central nervous system: Alert and oriented to person only, answering simple questions.  Extremities: trace edema.  Skin: No rashes,  Psychiatry:  flat affect    Data Reviewed:  I have personally reviewed following labs and imaging studies   CBC Lab Results  Component Value Date  WBC 5.6 06/24/2024   RBC 3.41 (L) 06/24/2024   HGB 9.6 (L) 06/24/2024   HCT 29.9 (L) 06/24/2024    MCV 87.7 06/24/2024   MCH 28.2 06/24/2024   PLT 218 06/24/2024   MCHC 32.1 06/24/2024   RDW 15.4 06/24/2024   LYMPHSABS 1.1 06/15/2024   MONOABS 1.0 06/15/2024   EOSABS 0.0 06/15/2024   BASOSABS 0.0 06/15/2024     Last metabolic panel Lab Results  Component Value Date   NA 137 06/23/2024   K 4.2 06/23/2024   CL 105 06/23/2024   CO2 24 06/23/2024   BUN 27 (H) 06/23/2024   CREATININE 0.81 06/23/2024   GLUCOSE 347 (H) 06/23/2024   GFRNONAA >60 06/23/2024   GFRAA 55 (L) 01/26/2020   CALCIUM  8.1 (L) 06/23/2024   PHOS 2.3 (L) 06/18/2024   PROT 5.7 (L) 06/15/2024   PROT 5.7 (L) 06/15/2024   ALBUMIN  2.5 (L) 06/15/2024   ALBUMIN  2.5 (L) 06/15/2024   BILITOT 2.3 (H) 06/15/2024   BILITOT 2.2 (H) 06/15/2024   ALKPHOS 65 06/15/2024   ALKPHOS 65 06/15/2024   AST 14 (L) 06/15/2024   AST 14 (L) 06/15/2024   ALT 11 06/15/2024   ALT 11 06/15/2024   ANIONGAP 8 06/23/2024    CBG (last 3)  Recent Labs    06/23/24 2117 06/24/24 0803 06/24/24 1212  GLUCAP 70 149* 145*      Coagulation Profile: No results for input(s): INR, PROTIME in the last 168 hours.   Radiology Studies: DG Swallowing Func-Speech Pathology Result Date: 06/24/2024 Table formatting from the original result was not included. Modified Barium Swallow Study Patient Details Name: Kristen Bowman MRN: [id number] Date of Birth: 10/06/1930 Today's Date: 06/24/2024 HPI/PMH: HPI: Patient is a 89 y.o. female with past medical history of diabetes mellitus type 2, hypertension, history of E. Coli UTI and associated encephalopahty, right bundle branch block, chronic anemia, chronic kidney disease stage II, anemia, dementia, depression independent and oriented at baseline come from AMS and hyperglycemia. Patient was receiving respite care at a SNF and was admitted to the hospital on 10/21 secondary to AMS hyperglycemia (daughter suspects patient was not being given her insulin ) and altered mental status and UA was positive  for UTI. Also with PNA, suspected aspiration. Per RN note from 10/22, during oral care, suspected emesis in oral cavity and around dentures. Bedside swallow evaluation ordered to assess patient's swallow. Patient placed on dys 2 nectar thick diet. SLP signed off. New swallow evaluation ordered on 06/23/24 as family is requesting a change to puree diet. Clinical Impression: Clinical Impression: MBS complete. Unfortunately, patient's swallowing function is worse than it appears at bedside and possibly has declined even since initially evaluated by SLP. Oral and pharyngeal strength largely WFL with only mild base of tongue weakness resulting in trace-minimal vallecular residuals post swallow. Primary deficit is combination of age and cognitively impacted.  Timing of swallow is inconsistent, at times with swallow triggered at the vallecula with resultant intact airway protection, even with thin liquids. In at least 50% of opportunities however, swallow is triggered either once the bolus has reached the vocal cords or at the pyriform sinuses and it is during these times that the bolus is either penetrated to the level fo the vocal cords or aspirated, sensing aspiration in less than 25% of episodes with often ineffective cough response to clear. Attempted to faciliate chin tuck using straw sips of liquids however this only worsens airway protection. Additionally, vallecular residuals, while minimal, quickly spill  to the pyriform sinuses post swallow without awareness or spontaneous dry swallow and are intermittently aspirated silently post swallow. Cognitive function does not allow for consistent carry out of throat clearing or coughing which otherwise would likely be functional to clear aspirates. Nectar thick liquids aspirated in slightly few instances than thin liquids and honey thick liquids not aspirated at all during today's study, although would not, in this SLPs opinion be a viable option in significantly  decreasing aspiration risk and may only increase risk of development of an aspiration related infection and increase risk of dehydration. Attempted to contact patient's daugther via phone to discuss without answer. Given that focus is on comfort and that plans are for home with Hospice services after d/c, will keep diet as is for now until discussion can be had with daughter. Ultimately, family may wish to consider liberalizing diet to dysphagia 1 (puree) or even regular (if patient alert and not pocketing) and thin liquids for pleasure and decreased risk of dehydration. Factors that may increase risk of adverse event in presence of aspiration Noe & Lianne 2021): Factors that may increase risk of adverse event in presence of aspiration Noe & Lianne 2021): Respiratory or GI disease; Reduced cognitive function; Limited mobility; Frail or deconditioned Recommendations/Plan: Swallowing Evaluation Recommendations Swallowing Evaluation Recommendations Recommendations: PO diet PO Diet Recommendation: Dysphagia 1 (Pureed); Thin liquids (Level 0) (with known risk of aspiration, need to discuss with daughter first) Liquid Administration via: Cup; Straw Medication Administration: Crushed with puree Supervision: Staff to assist with self-feeding; Full supervision/cueing for swallowing strategies Swallowing strategies  : Slow rate; Small bites/sips; Clear throat intermittently; Hard cough after swallowing Postural changes: Position pt fully upright for meals Oral care recommendations: Oral care BID (2x/day) Treatment Plan Treatment Plan Treatment recommendations: Therapy as outlined in treatment plan below Follow-up recommendations: No SLP follow up Functional status assessment: Patient has had a recent decline in their functional status and demonstrates the ability to make significant improvements in function in a reasonable and predictable amount of time. Recommendations Recommendations for follow up therapy are one  component of a multi-disciplinary discharge planning process, led by the attending physician.  Recommendations may be updated based on patient status, additional functional criteria and insurance authorization. Assessment: Orofacial Exam: Orofacial Exam Oral Cavity: Oral Hygiene: WFL Oral Cavity - Dentition: Dentures, not available Orofacial Anatomy: WFL Oral Motor/Sensory Function: WFL Anatomy: Anatomy: WFL Boluses Administered: Boluses Administered Boluses Administered: Thin liquids (Level 0); Mildly thick liquids (Level 2, nectar thick); Moderately thick liquids (Level 3, honey thick); Puree; Solid  Oral Impairment Domain: Oral Impairment Domain Lip Closure: No labial escape Tongue control during bolus hold: Cohesive bolus between tongue to palatal seal Bolus preparation/mastication: Timely and efficient chewing and mashing (with puree) Bolus transport/lingual motion: Brisk tongue motion Oral residue: Trace residue lining oral structures Location of oral residue : Tongue; Palate Initiation of pharyngeal swallow : Valleculae; Pyriform sinuses (inconsistent)  Pharyngeal Impairment Domain: Pharyngeal Impairment Domain Soft palate elevation: No bolus between soft palate (SP)/pharyngeal wall (PW) Laryngeal elevation: Complete superior movement of thyroid  cartilage with complete approximation of arytenoids to epiglottic petiole Anterior hyoid excursion: Complete anterior movement Epiglottic movement: Complete inversion Laryngeal vestibule closure: Complete, no air/contrast in laryngeal vestibule Pharyngeal stripping wave : Present - complete Pharyngeal contraction (A/P view only): N/A Pharyngoesophageal segment opening: Complete distension and complete duration, no obstruction of flow Tongue base retraction: Trace column of contrast or air between tongue base and PPW Pharyngeal residue: Collection of residue within or on pharyngeal  structures Location of pharyngeal residue: Valleculae  Esophageal Impairment Domain:  Esophageal Impairment Domain Esophageal clearance upright position: Complete clearance, esophageal coating Pill: Pill Consistency administered: -- (NT) Penetration/Aspiration Scale Score: Penetration/Aspiration Scale Score 1.  Material does not enter airway: Puree; Solid; Moderately thick liquids (Level 3, honey thick) 5.  Material enters airway, CONTACTS cords and not ejected out: Thin liquids (Level 0); Mildly thick liquids (Level 2, nectar thick) 7.  Material enters airway, passes BELOW cords and not ejected out despite cough attempt by patient: Thin liquids (Level 0) (with max cues) 8.  Material enters airway, passes BELOW cords without attempt by patient to eject out (silent aspiration) : Thin liquids (Level 0); Mildly thick liquids (Level 2, nectar thick) Compensatory Strategies: Compensatory Strategies Compensatory strategies: Yes Straw: Ineffective Ineffective Straw: Thin liquid (Level 0) Chin tuck: Ineffective Ineffective Chin Tuck: Thin liquid (Level 0)   General Information: Caregiver present: No  Diet Prior to this Study: Dysphagia 2 (finely chopped); Mildly thick liquids (Level 2, nectar thick)   Temperature : Febrile   Respiratory Status: WFL   Supplemental O2: Nasal cannula   History of Recent Intubation: No  Behavior/Cognition: Alert; Cooperative; Pleasant mood; Requires cueing; Distractible Self-Feeding Abilities: Able to self-feed; Needs set-up for self-feeding Baseline vocal quality/speech: Normal Volitional Cough: Able to elicit (inconsistent due to mentation) Volitional Swallow: Able to elicit No data recorded Goal Planning: Prognosis for improved oropharyngeal function: Guarded Barriers to Reach Goals: Cognitive deficits; Overall medical prognosis No data recorded Patient/Family Stated Goal: None stated No data recorded Pain: Pain Assessment Pain Assessment: Faces Faces Pain Scale: 0 Breathing: 0 Negative Vocalization: 0 Facial Expression: 0 Body Language: 0 Consolability: 0 PAINAD Score: 0  Facial Expression: 0 Body Movements: 0 Muscle Tension: 0 Compliance with ventilator (intubated pts.): N/A Vocalization (extubated pts.): N/A CPOT Total: 0 End of Session: Start Time:SLP Start Time (ACUTE ONLY): 0940 Stop Time: SLP Stop Time (ACUTE ONLY): 1000 Time Calculation:SLP Time Calculation (min) (ACUTE ONLY): 20 min Charges: SLP Evaluations $ SLP Speech Visit: 1 Visit SLP Evaluations $BSS Swallow: 1 Procedure $MBS Swallow: 1 Procedure SLP visit diagnosis: SLP Visit Diagnosis: Dysphagia, oropharyngeal phase (R13.12) Past Medical History: Past Medical History: Diagnosis Date  Anemia   Anxiety states   Diabetes mellitus   T2DM with renal manifestations  Generalized osteoarthrosis, unspecified site   Hematuria   Hyperlipidemia   mixed  Hypertension   Irritable bowel syndrome   Other specified cardiac dysrhythmias(427.89)   atrial fibrilation  Panic disorder   Polymyalgia rheumatica   Proteinuria 05/29/10  Type II or unspecified type diabetes mellitus with renal manifestations, uncontrolled(250.42) 05/29/10  CKD II (mild) Past Surgical History: Past Surgical History: Procedure Laterality Date  CATARACT EXTRACTION    right  COLONOSCOPY N/A 11/04/2012  Procedure: COLONOSCOPY;  Surgeon: Elsie Cree, MD;  Location: [facility];  Service: Endoscopy;  Laterality: N/A;  ESOPHAGOGASTRODUODENOSCOPY N/A 11/04/2012  Procedure: ESOPHAGOGASTRODUODENOSCOPY (EGD);  Surgeon: Elsie Cree, MD;  Location: [facility];  Service: Endoscopy;  Laterality: N/A;  TUBAL LIGATION   Leah McCoy MA, CCC-SLP McCoy Leah Meryl 06/24/2024, 10:40 AM  DG CHEST PORT 1 VIEW Result Date: 06/23/2024 CLINICAL DATA:  Follow-up left lower lobe infection. EXAM: PORTABLE CHEST 1 VIEW COMPARISON:  Radiograph 06/20/2024, remote chest CT reviewed. FINDINGS: Worsening left lung base opacity with developing pleural effusion. Stable cardiomegaly. Unchanged mediastinal contours with aortic atherosclerosis. Mild patchy opacity at the right lung base. No  pneumothorax. IMPRESSION: 1. Worsening left lung base opacity with developing pleural effusion. 2. Mild  patchy opacity at the right lung base, atelectasis versus pneumonia. Electronically Signed   By: Andrea Gasman M.D.   On: 06/23/2024 15:01       Elgie Butter M.D. [facility] 06/24/2024, 2:13 PM  Available via Epic secure chat 7am-7pm After 7 pm, please refer to night coverage provider listed on amion.

## 2024-06-24 NOTE — Progress Notes (Signed)
 Modified Barium Swallow Study  Patient Details  Name: Kristen Bowman MRN: [id number] Date of Birth: [birth year]  Today's Date: 06/24/2024  Modified Barium Swallow completed.  Full report located under Chart Review in the Imaging Section.  History of Present Illness Patient is a 89 y.o. female with past medical history of diabetes mellitus type 2, hypertension, history of E. Coli UTI and associated encephalopahty, right bundle branch block, chronic anemia, chronic kidney disease stage II, anemia, dementia, depression independent and oriented at baseline come from AMS and hyperglycemia. Patient was receiving respite care at a SNF and was admitted to the hospital on 10/21 secondary to AMS hyperglycemia (daughter suspects patient was not being given her insulin ) and altered mental status and UA was positive for UTI. Also with PNA, suspected aspiration. Per RN note from 10/22, during oral care, suspected emesis in oral cavity and around dentures. Bedside swallow evaluation ordered to assess patient's swallow. Patient placed on dys 2 nectar thick diet. SLP signed off. New swallow evaluation ordered on 06/23/24 as family is requesting a change to puree diet.   Clinical Impression MBS complete. Unfortunately, patient's swallowing function is worse than it appears at bedside and possibly has declined even since initially evaluated by SLP. Oral and pharyngeal strength largely WFL with only mild base of tongue weakness resulting in trace-minimal vallecular residuals post swallow. Primary deficit is combination of age and cognitively impacted.  Timing of swallow is inconsistent, at times with swallow triggered at the vallecula with resultant intact airway protection, even with thin liquids. In at least 50% of opportunities however, swallow is triggered either once the bolus has reached the vocal cords or at the pyriform sinuses and it is during these times that the bolus is either penetrated to the level fo the  vocal cords or aspirated, sensing aspiration in less than 25% of episodes with often ineffective cough response to clear. Attempted to faciliate chin tuck using straw sips of liquids however this only worsens airway protection. Additionally, vallecular residuals, while minimal, quickly spill to the pyriform sinuses post swallow without awareness or spontaneous dry swallow and are intermittently aspirated silently post swallow. Cognitive function does not allow for consistent carry out of throat clearing or coughing which otherwise would likely be functional to clear aspirates. Nectar thick liquids aspirated in slightly few instances than thin liquids and honey thick liquids not aspirated at all during today's study, although would not, in this SLPs opinion be a viable option in significantly decreasing aspiration risk and may only increase risk of development of an aspiration related infection and increase risk of dehydration. Attempted to contact patient's daugther via phone to discuss without answer. Given that focus is on comfort and that plans are for home with Hospice services after d/c, will keep diet as is for now until discussion can be had with daughter. Ultimately, family may wish to consider liberalizing diet to dysphagia 1 (puree) or even regular (if patient alert and not pocketing) and thin liquids for pleasure and decreased risk of dehydration. Factors that may increase risk of adverse event in presence of aspiration Noe & Lianne 2021): Respiratory or GI disease;Reduced cognitive function;Limited mobility;Frail or deconditioned  Swallow Evaluation Recommendations Recommendations: PO diet PO Diet Recommendation: Dysphagia 1 (Pureed);Thin liquids (Level 0) (with known risk of aspiration, need to discuss with daughter first) Liquid Administration via: Cup;Straw Medication Administration: Crushed with puree Supervision: Staff to assist with self-feeding;Full supervision/cueing for swallowing  strategies Swallowing strategies  : Slow rate;Small bites/sips;Clear throat intermittently;Hard cough  after swallowing Postural changes: Position pt fully upright for meals Oral care recommendations: Oral care BID (2x/day)     Catrina Fellenz MA, CCC-SLP  Tandra Rosado Meryl 06/24/2024,10:40 AM

## 2024-06-24 NOTE — Progress Notes (Signed)
 Hypoglycemic Event  CBG: 47   Treatment: 8 oz juice/soda  Symptoms: drowsy  Follow-up CBG: Time:2200  CBG Result:91   Possible Reasons for Event: Inadequate meal intake  Comments/MD notified:MD    Hazeline KIDD Shaunte Weissinger

## 2024-06-25 DIAGNOSIS — E1010 Type 1 diabetes mellitus with ketoacidosis without coma: Secondary | ICD-10-CM

## 2024-06-25 DIAGNOSIS — Z515 Encounter for palliative care: Secondary | ICD-10-CM | POA: Diagnosis not present

## 2024-06-25 DIAGNOSIS — Z7189 Other specified counseling: Secondary | ICD-10-CM

## 2024-06-25 DIAGNOSIS — R638 Other symptoms and signs concerning food and fluid intake: Secondary | ICD-10-CM

## 2024-06-25 DIAGNOSIS — Z711 Person with feared health complaint in whom no diagnosis is made: Secondary | ICD-10-CM

## 2024-06-25 DIAGNOSIS — Z789 Other specified health status: Secondary | ICD-10-CM

## 2024-06-25 DIAGNOSIS — R5383 Other fatigue: Secondary | ICD-10-CM

## 2024-06-25 DIAGNOSIS — R4182 Altered mental status, unspecified: Secondary | ICD-10-CM | POA: Diagnosis not present

## 2024-06-25 DIAGNOSIS — Z66 Do not resuscitate: Secondary | ICD-10-CM

## 2024-06-25 LAB — GLUCOSE, CAPILLARY
Glucose-Capillary: 103 mg/dL — ABNORMAL HIGH (ref 70–99)
Glucose-Capillary: 96 mg/dL (ref 70–99)
Glucose-Capillary: 96 mg/dL (ref 70–99)
Glucose-Capillary: 160 mg/dL — ABNORMAL HIGH (ref 70–99)

## 2024-06-25 NOTE — Progress Notes (Signed)
 Daily Progress Note   Patient Name: Kristen Bowman       Date: 06/25/2024 DOB: 05-Jun-1931  Age: 89 y.o. MRN#: [id number] Attending Physician: Cherlyn Labella, MD Primary Care Physician: Valentin Skates, DO Admit Date: 06/15/2024  Reason for Consultation/Follow-up: Establishing goals of care  Subjective: I have reviewed medical records including EPIC notes, MAR, and labs. Received report from primary RN -no acute concerns.  RN does report that patient is lethargic  this morning -does not adequately arouse for oral intake, including meals or medications.  RN reports patient's blood sugar was in the 40s last night (noted 47 at 2119) -patient was arousable enough at that time to drink juice.  She required juice x 3.  Went to visit patient at bedside-no family/visitors present.  Patient was lying in bed -she is lethargic. No signs or non-verbal gestures of pain or discomfort noted. No respiratory distress, increased work of breathing, or secretions noted.  She is on 3 L O2 nasal cannula.  11:01 AM Called daughter/Carol -emotional support provided.  Discussed interval history since last PMT follow-up.  Therapeutic listening provided as [name] reflects on discussions with SLP yesterday.  She has a clear understanding from that discussion the concerns regarding patient's diet/ongoing aspiration.  She has not yet had the opportunity to speak with her daughter regarding the information given to her by SLP.  Offered conference call with [name] today; however, [name] declines indicating [name] is not available.  Provided updates per RN and my assessment today.  Discussed concern over patient's worsening clinical status.  Natural trajectory at end-of-life reviewed.  [name] indicates that patient was calling out for her  deceased relatives last night. Explained to [name] that patient is likely approaching end-of-life.  [name] understands that this acute event is likely not one that patient will be able to bounce back from.  Therapeutic listening provided as [name] reflects on the events that led to patient's admission as well as patient's previous baseline prior to admission.  Patient was ambulating using a walker, enjoying her cats, like to watch TV and look out the window.  [name] understands that patient is at high risk for ongoing decline and she is understandably tearful.  [name] agrees that patient is likely approaching end-of-life.  Reviewed that goal is still for patient's discharge with hospice.  Recommendation given for  discharge with hospice sooner than later as it is anticipated patient will continue to decline and at some point soon may become unstable for transfer.  [name] is unsure at this time if she would want patient discharged back home versus hospice facility.  If they opt for hospice facility they would be most interested in [facility] as this is close to their home.  Her brother passed away at [facility] several years ago and they had a positive experience.  [name] wishes to discuss information from today with her daughter/Lisa prior to making final decisions.  If/when they make decisions today they will call to provide PMT with an update.  If no decisions today they are open for PMT follow-up tomorrow with [name] will be at bedside and [name] available via speaker phone.  All questions and concerns addressed. Encouraged to call with questions and/or concerns. PMT number provided via text with [employer] per her request.  Length of Stay: 10  Current Medications: Scheduled Meds:   amLODipine   10 mg Oral Daily   clonazePAM   0.5 mg Oral Daily   divalproex   250 mg Oral BID   donepezil   5 mg Oral QHS   feeding supplement  237 mL Oral BID BM   heparin  injection (subcutaneous)  5,000 Units Subcutaneous Q8H    insulin  aspart  0-5 Units Subcutaneous QHS   insulin  aspart  0-9 Units Subcutaneous TID WC   insulin  aspart  5 Units Subcutaneous TID WC   insulin  glargine-yfgn  30 Units Subcutaneous Daily   irbesartan  300 mg Oral Daily   metoprolol  tartrate  75 mg Oral BID   nystatin  5 mL Oral QID    Continuous Infusions:   PRN Meds: acetaminophen , dextrose , hydrALAZINE , labetalol   Physical Exam Vitals and nursing note reviewed.  Constitutional:      General: She is not in acute distress. Pulmonary:     Effort: No respiratory distress.  Skin:    General: Skin is warm and dry.  Neurological:     Mental Status: She is lethargic.             Vital Signs: BP (!) 141/49 (BP Location: Right Arm)   Pulse 64   Temp 97.8 F (36.6 C) (Axillary)   Resp 15   Ht 5' 6 (1.676 m)   Wt 69 kg   SpO2 94%   BMI 24.55 kg/m  SpO2: SpO2: 94 % O2 Device: O2 Device: Nasal Cannula O2 Flow Rate: O2 Flow Rate (L/min): 3 L/min  Intake/output summary:  Intake/Output Summary (Last 24 hours) at 06/25/2024 1058 Last data filed at 06/25/2024 0500 Gross per 24 hour  Intake 480 ml  Output 700 ml  Net -220 ml   LBM: Last BM Date : 06/24/24 Baseline Weight: Weight: 74.8 kg Most recent weight: Weight: 69 kg       Palliative Assessment/Data: PPS 10-20%      Patient Active Problem List   Diagnosis Date Noted   DKA (diabetic ketoacidosis) (HCC) 06/22/2024   Acute hypoxic respiratory failure (HCC) 06/22/2024   Aspiration pneumonia (HCC) 06/22/2024   Hypernatremia 06/22/2024   Elevated troponin 06/16/2024   Pressure injury of skin 06/16/2024   AMS (altered mental status) 06/15/2024   UTI (urinary tract infection) 03/27/2024   Acute encephalopathy 03/26/2024   Diabetes mellitus type 2 in nonobese (HCC) 03/26/2024   Dementia without behavioral disturbance (HCC) 10/17/2019   Pulmonary hypertension (HCC) 06/09/2014   Tachycardia 09/24/2012   Palpitations 09/24/2012   CKD (chronic kidney  disease),  stage II 09/24/2012   Anemia, iron  deficiency 09/24/2012   Primary hypertension    Panic disorder    Hyperlipidemia    Type II or unspecified type diabetes mellitus with renal manifestations, uncontrolled(250.42) 05/29/2010    Palliative Care Assessment & Plan   Patient Profile: 89 y.o. female  with past medical history of diabetes mellitus type 2, hypertension, history of E. coli UTI and associated encephalopathy, right bundle branch block, chronic anemia, chronic kidney disease stage II, anemia,  dementia, depression was admitted on 06/15/2024 with AMS secondary to metabolic encephalopathy, DKA/DM II, AKI on CKD II/dehydration, sepsis secondary to aspiration pneumonia and UTI, acute hypoxic respiratory failure.    Of note, patient has had 2 admissions and 1 ED visit in the last 6 months.  Assessment: Principal Problem:   AMS (altered mental status) Active Problems:   Tachycardia   Primary hypertension   Dementia without behavioral disturbance (HCC)   Elevated troponin   Pressure injury of skin   DKA (diabetic ketoacidosis) (HCC)   Acute hypoxic respiratory failure (HCC)   Aspiration pneumonia (HCC)   Hypernatremia   Concern about end of life  Recommendations/Plan: Continue current gentle medical treatment - family considering discharge home with hospice versus hospice facility sooner than later considering patient's clinical decline Patient is likely approaching end-of-life and comfort care/hospice is recommended Continue DNR-Limited as previously documented Daughter wishes to discuss information from today with patient's granddaughter prior to making final decisions. -they will call PMT if/when any are made today.  If not, they are open for PMT follow-up tomorrow 11/1 PMT will continue to follow and support holistically  Goals of Care and Additional Recommendations: Limitations on Scope of Treatment: Full Scope Treatment  Code Status:    Code Status Orders  (From  admission, onward)           Start     Ordered   06/16/24 0217  Do not attempt resuscitation (DNR)- Limited -Do Not Intubate (DNI)  (Code Status)  Continuous       Question Answer Comment  If pulseless and not breathing No CPR or chest compressions.   In Pre-Arrest Conditions (Patient Is Breathing and Has A Pulse) Do not intubate. Provide all appropriate non-invasive medical interventions. Avoid ICU transfer unless indicated or required.   Consent: Discussion documented in EHR or advanced directives reviewed      06/16/24 0216           Code Status History     Date Active Date Inactive Code Status Order ID Comments User Context   06/15/2024 1727 06/16/2024 0216 Full Code [id number]  Tobie Mario GAILS, MD ED   03/27/2024 0322 04/01/2024 1652 Full Code [id number]  Franky Redia SAILOR, MD Inpatient   11/13/2019 0754 11/20/2019 1648 Full Code [id number]  Cottie Donnice PARAS, MD ED   10/17/2019 0236 10/22/2019 0204 Full Code [id number]  Vicky Lamar RIGGERS ED   09/25/2012 0051 09/26/2012 1638 Full Code [id number]  Cruise, Delon RAMAN, RN Inpatient      Advance Directive Documentation    Flowsheet Row Most Recent Value  Type of Advance Directive Healthcare Power of Attorney  Pre-existing out of facility DNR order (yellow form or pink MOST form) --  MOST Form in Place? --    Prognosis:  < 2 weeks  Discharge Planning: Home with hospice versus inpatient hospice facility  Care plan was discussed with primary RN, Dr. Cherlyn, [facility], patient's daughter  Thank you for allowing the Palliative Medicine Team to assist  in the care of this patient.   Total Time 50 minutes Prolonged Time Billed  no       Jeoffrey CHRISTELLA Sharps, NP  Please contact Palliative Medicine Team phone at [phone] for questions and concerns.   *Portions of this note are a verbal dictation therefore any spelling and/or grammatical errors are due to the Dragon [employer] One system interpretation.

## 2024-06-25 NOTE — Progress Notes (Signed)
 [facility]                                                                               Kristen Bowman, is a 89 y.o. female, DOB - 07-12-1931, FMW:[id number] Admit date - 06/15/2024    Outpatient Primary MD for the patient is Valentin Skates, DO  LOS - 10  days    Brief summary    The patient is a 89 year old female on hospice care with a PMHx of dementia, CKD stage II, T2DM, HTN, anemia who presented to the ED on 06/15/2024 from [facility] for increased lethargy, hypoxia, and hyperglycemia, found to be in DKA with acute hypoxic respiratory failure and sepsis secondary to aspiration pneumonia.    Assessment & Plan    Assessment and Plan:    # Acute hypoxic respiratory failure - improving - Presented with SpO2 of 88% on RA with tachypnea in the 20s. Initially, likely secondary to aspiration pneumonia.  - She is still requiring supplemental O2 at this time, suspect she is somewhat volume overloaded (net +3.9L since admission) given amount of fluids she received for DKA, noted mild LE edema and bilateral crackles on exam - slightly fluid overloaded, was given 2 doses of IV lasix and 1600 ml urine out yesterday.  -she remains on oxygen, led edema improving with IV lasix.  But her repeat CXR shows worsening of the left lung and right lung base opacities, suspect ongoing aspiration despite change in diet and antibiotics.  - SLP eval done, MBS done, showing worsening of her dysphagia due to her cognitive impairment.  - she remains on 3 to 4 lit of [place] oxygen.     # Sepsis  - Presented with leukocytosis to 12.6, tachypnea 27, lactic acid elevated to 3.4 with AMS and AKI, with concern for underlying aspiration pneumonia and UTI - resolved.    # Aspiration pneumonia - Concern for aspiration pneumonia given encephalopathy with findings concerning for old emesis on oral care  - CXR (10/22) showing new confluent new retrocardiac opacity on the left compatible with new or  increased left lower lung collapse or consolidation - RPR negative - Procalcitonin 0.21 on 10/22 - she remains afebrile, with WBC count wnl.  - but her repeat CXR shows worsening of the opacities, suspect continual aspiration .  - discussed with the patient's grand daughter over the phone.    #Hypertension Optimal BP parameters.  Continue with amlodipine , avapro and metoprolol  75 mg BID.    # Hypernatremia - resolved - Initially elevated Na in the setting of dehydration and osmotic diuresis from DKA then resolved. Sodium wnl.    # Dysphagia - Patient presented with encephalopathy secondary to DKA complicated by aspiration pneumonia. Her mental status has improved. However, she remains dysphagic. SLP is following and has recommended a dysphagia 1 diet with nectar thick liquids. -  Family not interested in Cortrak anymore. MBS done, and showed worsening dysphagia.    # DKA - resolved Uncontrolled DM with episodes of of hypoglycemia - Presented with hyperglycemia to 770s in the setting of not receiving insulin  at SNF during 5-day stay. BhB elevated to > 8 on admission - BhB wnl -  CBG (last 3)  Recent Labs    06/25/24 0732 06/25/24 1154 06/25/24 1617  GLUCAP 103* 96 96   Stopped all insulin  regimens except for SSI as patient has poor oral intake.    #Lactic acidosis - resolved - Episodes of recurrent lactic acidosis, possibly secondary to aspiration pneumonia and dehydration -  resolved.    # High anion gap metabolic acidosis - resolved - Likely secondary to lactic acidosis and DKA - Continue management of underlying causes as above   # Possible UTI - UA concerning for UTI. Patient encephalopathic and unable to reliably communicate symptoms - History of ESBL E. Coli - Urine culture showing greater than 100,000 colonies of multiple species and suggested recollection.  However, patient receiving antibiotics with clinical improvement, so repeat UA/UCx futile at this point -  Completed 7 days of meropenem   # Hypokalemia - resolved   #Acute metabolic encephalopathy  - Likely secondary to acute illness caused by aspiration pneumonia in the setting of underlying dementia - she is oriented to person only , when awake.  - discussed about getting another CT  head, but family is considering hospice.    #AKI on CKD stage IIIa - resolved - Creatinine elevated 2.74 on admission, baseline around 0.9 - Likely prerenal in the setting of dehydration from vomiting and osmotic diuresis - Improved with IVFs - creatinine stable around 0.8   #Elevated troponins - High-sensitivity troponins peaked at 192 - Likely secondary to acute illness, aspiration pneumonia, DKA  - Echo showed LVEF 70 to 75% with no regional wall motion normalities.  Noted severe asymmetric left ventricular hypertrophy of the basal septal segment - Cardiology evaluated - indicated unlikely ACS if no abnormalities on echo - she was started on heparin  gtt and discontinued on 10/22.  - currently chest pain free.    #Dementia #Anxiety #Depression - Home meds include sertraline  50 mg daily, Klonopin  0.5 mg daily, Depakote  125 mg twice daily, donepezil  5 mg daily, trazodone  50 mg nightly, risperidone  0.5 mg nightly. Initially held due to AMS and NPO.  Patient's grand daughter is concerned that patient remains sleepy most of the time, we discussed about decreasing the dose of clonazepam  but family does not  want to do it as she has been on these meds for more than 10 years. She continues to refuse getting up and has minimal oral intake.  Patient continues to worsen despite completing antibiotics.      RN Pressure Injury Documentation: Wound 06/23/24 1430 Pressure Injury Coccyx Mid Stage 3 -  Full thickness tissue loss. Subcutaneous fat may be visible but bone, tendon or muscle are NOT exposed. (Active)     Wound 06/23/24 1430 Pressure Injury Heel Left Deep Tissue Pressure Injury - Purple or maroon localized  area of discolored intact skin or blood-filled blister due to damage of underlying soft tissue from pressure and/or shear. (Active)  Wound care consulted and recommendations given.   Malnutrition Type:  Nutrition Problem: Inadequate oral intake Etiology: chronic illness (dementia)   Malnutrition Characteristics:  Signs/Symptoms: per patient/family report   Nutrition Interventions:  Interventions: Ensure Enlive (each supplement provides 350kcal and 20 grams of protein), Tube feeding  Estimated body mass index is 24.55 kg/m as calculated from the following:   Height as of this encounter: 5' 6 (1.676 m).   Weight as of this encounter: 69 kg.  Code Status: DNR limited.  DVT Prophylaxis:  heparin  injection 5,000 Units Start: 06/16/24 2200   Level of Care: Level of  care: Progressive Family Communication: none at bedside today.  Disposition Plan:     Remains inpatient appropriate:  home with hospice.   Procedures:  None.   Consultants:   None.   Antimicrobials:   Anti-infectives (From admission, onward)    Start     Dose/Rate Route Frequency Ordered Stop   06/17/24 2200  vancomycin  (VANCOCIN ) IVPB 1000 mg/200 mL premix  Status:  Discontinued        1,000 mg 200 mL/hr over 60 Minutes Intravenous Every 48 hours 06/16/24 0935 06/16/24 1810   06/16/24 0400  meropenem (MERREM) 1 g in sodium chloride  0.9 % 100 mL IVPB        1 g 200 mL/hr over 30 Minutes Intravenous Every 12 hours 06/16/24 0335 06/22/24 2106   06/15/24 1900  vancomycin  (VANCOREADY) IVPB 1500 mg/300 mL        1,500 mg 150 mL/hr over 120 Minutes Intravenous  Once 06/15/24 1853 06/15/24 2357   06/15/24 1853  vancomycin  variable dose per unstable renal function (pharmacist dosing)  Status:  Discontinued         Does not apply See admin instructions 06/15/24 1853 06/16/24 0935   06/15/24 1845  cefTRIAXone  (ROCEPHIN ) 2 g in sodium chloride  0.9 % 100 mL IVPB  Status:  Discontinued       Note to Pharmacy: Please  confirm patient has had blood and urine cultures prior to antibiotic administration.   2 g 200 mL/hr over 30 Minutes Intravenous Every 24 hours 06/15/24 1836 06/16/24 0335        Medications  Scheduled Meds:  amLODipine   10 mg Oral Daily   clonazePAM   0.5 mg Oral Daily   divalproex   250 mg Oral BID   donepezil   5 mg Oral QHS   feeding supplement  237 mL Oral BID BM   heparin  injection (subcutaneous)  5,000 Units Subcutaneous Q8H   insulin  aspart  0-5 Units Subcutaneous QHS   insulin  aspart  0-9 Units Subcutaneous TID WC   irbesartan  300 mg Oral Daily   metoprolol  tartrate  75 mg Oral BID   nystatin  5 mL Oral QID   Continuous Infusions: PRN Meds:.acetaminophen , dextrose , hydrALAZINE , labetalol     Subjective:   Batsheva Stevick was seen and examined today.   Wakes up to answer simple questions.   Objective:   Vitals:   06/25/24 0733 06/25/24 1147 06/25/24 1155 06/25/24 1617  BP: (!) 141/49 (!) 145/51 (!) 145/55 137/62  Pulse: 64 68 83 69  Resp: 15  13 20   Temp: 97.8 F (36.6 C)  97.6 F (36.4 C) 97.7 F (36.5 C)  TempSrc: Axillary  Axillary Axillary  SpO2: 94%  98% 97%  Weight:      Height:        Intake/Output Summary (Last 24 hours) at 06/25/2024 1730 Last data filed at 06/25/2024 1622 Gross per 24 hour  Intake 480 ml  Output 1500 ml  Net -1020 ml   Filed Weights   06/15/24 1552 06/15/24 1953 06/21/24 1113  Weight: 74.8 kg 62.8 kg 69 kg     Exam  General exam:ill appearing lady, not in distress.  Respiratory system: diminished air entry, on [place] oxygen.  Cardiovascular system: S1 & S2 heard, RRR. No JVD,  Gastrointestinal system: Abdomen is soft bs+ Central nervous system: Alert and oriented to person only.  Extremities: no cyanosis   Skin: No rashes,  Psychiatry: flat affect      Data Reviewed:  I have personally reviewed following  labs and imaging studies   CBC Lab Results  Component Value Date   WBC 5.6 06/24/2024   RBC 3.41 (L)  06/24/2024   HGB 9.6 (L) 06/24/2024   HCT 29.9 (L) 06/24/2024   MCV 87.7 06/24/2024   MCH 28.2 06/24/2024   PLT 218 06/24/2024   MCHC 32.1 06/24/2024   RDW 15.4 06/24/2024   LYMPHSABS 1.1 06/15/2024   MONOABS 1.0 06/15/2024   EOSABS 0.0 06/15/2024   BASOSABS 0.0 06/15/2024     Last metabolic panel Lab Results  Component Value Date   NA 136 06/24/2024   K 4.0 06/24/2024   CL 98 06/24/2024   CO2 26 06/24/2024   BUN 21 06/24/2024   CREATININE 0.86 06/24/2024   GLUCOSE 110 (H) 06/24/2024   GFRNONAA >60 06/24/2024   GFRAA 55 (L) 01/26/2020   CALCIUM  8.2 (L) 06/24/2024   PHOS 2.3 (L) 06/18/2024   PROT 5.7 (L) 06/15/2024   PROT 5.7 (L) 06/15/2024   ALBUMIN  2.5 (L) 06/15/2024   ALBUMIN  2.5 (L) 06/15/2024   BILITOT 2.3 (H) 06/15/2024   BILITOT 2.2 (H) 06/15/2024   ALKPHOS 65 06/15/2024   ALKPHOS 65 06/15/2024   AST 14 (L) 06/15/2024   AST 14 (L) 06/15/2024   ALT 11 06/15/2024   ALT 11 06/15/2024   ANIONGAP 12 06/24/2024    CBG (last 3)  Recent Labs    06/25/24 0732 06/25/24 1154 06/25/24 1617  GLUCAP 103* 96 96      Coagulation Profile: No results for input(s): INR, PROTIME in the last 168 hours.   Radiology Studies: DG Swallowing Func-Speech Pathology Result Date: 06/24/2024 Table formatting from the original result was not included. Modified Barium Swallow Study Patient Details Name: TAMA GROSZ MRN: [id number] Date of Birth: Dec 06, 1930 Today's Date: 06/24/2024 HPI/PMH: HPI: Patient is a 89 y.o. female with past medical history of diabetes mellitus type 2, hypertension, history of E. Coli UTI and associated encephalopahty, right bundle branch block, chronic anemia, chronic kidney disease stage II, anemia, dementia, depression independent and oriented at baseline come from AMS and hyperglycemia. Patient was receiving respite care at a SNF and was admitted to the hospital on 10/21 secondary to AMS hyperglycemia (daughter suspects patient was not being given her  insulin ) and altered mental status and UA was positive for UTI. Also with PNA, suspected aspiration. Per RN note from 10/22, during oral care, suspected emesis in oral cavity and around dentures. Bedside swallow evaluation ordered to assess patient's swallow. Patient placed on dys 2 nectar thick diet. SLP signed off. New swallow evaluation ordered on 06/23/24 as family is requesting a change to puree diet. Clinical Impression: Clinical Impression: MBS complete. Unfortunately, patient's swallowing function is worse than it appears at bedside and possibly has declined even since initially evaluated by SLP. Oral and pharyngeal strength largely WFL with only mild base of tongue weakness resulting in trace-minimal vallecular residuals post swallow. Primary deficit is combination of age and cognitively impacted.  Timing of swallow is inconsistent, at times with swallow triggered at the vallecula with resultant intact airway protection, even with thin liquids. In at least 50% of opportunities however, swallow is triggered either once the bolus has reached the vocal cords or at the pyriform sinuses and it is during these times that the bolus is either penetrated to the level fo the vocal cords or aspirated, sensing aspiration in less than 25% of episodes with often ineffective cough response to clear. Attempted to faciliate chin tuck using straw sips of  liquids however this only worsens airway protection. Additionally, vallecular residuals, while minimal, quickly spill to the pyriform sinuses post swallow without awareness or spontaneous dry swallow and are intermittently aspirated silently post swallow. Cognitive function does not allow for consistent carry out of throat clearing or coughing which otherwise would likely be functional to clear aspirates. Nectar thick liquids aspirated in slightly few instances than thin liquids and honey thick liquids not aspirated at all during today's study, although would not, in this  SLPs opinion be a viable option in significantly decreasing aspiration risk and may only increase risk of development of an aspiration related infection and increase risk of dehydration. Attempted to contact patient's daugther via phone to discuss without answer. Given that focus is on comfort and that plans are for home with Hospice services after d/c, will keep diet as is for now until discussion can be had with daughter. Ultimately, family may wish to consider liberalizing diet to dysphagia 1 (puree) or even regular (if patient alert and not pocketing) and thin liquids for pleasure and decreased risk of dehydration. Factors that may increase risk of adverse event in presence of aspiration Noe & Lianne 2021): Factors that may increase risk of adverse event in presence of aspiration Noe & Lianne 2021): Respiratory or GI disease; Reduced cognitive function; Limited mobility; Frail or deconditioned Recommendations/Plan: Swallowing Evaluation Recommendations Swallowing Evaluation Recommendations Recommendations: PO diet PO Diet Recommendation: Dysphagia 1 (Pureed); Thin liquids (Level 0) (with known risk of aspiration, need to discuss with daughter first) Liquid Administration via: Cup; Straw Medication Administration: Crushed with puree Supervision: Staff to assist with self-feeding; Full supervision/cueing for swallowing strategies Swallowing strategies  : Slow rate; Small bites/sips; Clear throat intermittently; Hard cough after swallowing Postural changes: Position pt fully upright for meals Oral care recommendations: Oral care BID (2x/day) Treatment Plan Treatment Plan Treatment recommendations: Therapy as outlined in treatment plan below Follow-up recommendations: No SLP follow up Functional status assessment: Patient has had a recent decline in their functional status and demonstrates the ability to make significant improvements in function in a reasonable and predictable amount of time.  Recommendations Recommendations for follow up therapy are one component of a multi-disciplinary discharge planning process, led by the attending physician.  Recommendations may be updated based on patient status, additional functional criteria and insurance authorization. Assessment: Orofacial Exam: Orofacial Exam Oral Cavity: Oral Hygiene: WFL Oral Cavity - Dentition: Dentures, not available Orofacial Anatomy: WFL Oral Motor/Sensory Function: WFL Anatomy: Anatomy: WFL Boluses Administered: Boluses Administered Boluses Administered: Thin liquids (Level 0); Mildly thick liquids (Level 2, nectar thick); Moderately thick liquids (Level 3, honey thick); Puree; Solid  Oral Impairment Domain: Oral Impairment Domain Lip Closure: No labial escape Tongue control during bolus hold: Cohesive bolus between tongue to palatal seal Bolus preparation/mastication: Timely and efficient chewing and mashing (with puree) Bolus transport/lingual motion: Brisk tongue motion Oral residue: Trace residue lining oral structures Location of oral residue : Tongue; Palate Initiation of pharyngeal swallow : Valleculae; Pyriform sinuses (inconsistent)  Pharyngeal Impairment Domain: Pharyngeal Impairment Domain Soft palate elevation: No bolus between soft palate (SP)/pharyngeal wall (PW) Laryngeal elevation: Complete superior movement of thyroid  cartilage with complete approximation of arytenoids to epiglottic petiole Anterior hyoid excursion: Complete anterior movement Epiglottic movement: Complete inversion Laryngeal vestibule closure: Complete, no air/contrast in laryngeal vestibule Pharyngeal stripping wave : Present - complete Pharyngeal contraction (A/P view only): N/A Pharyngoesophageal segment opening: Complete distension and complete duration, no obstruction of flow Tongue base retraction: Trace column of contrast or air  between tongue base and PPW Pharyngeal residue: Collection of residue within or on pharyngeal structures Location of  pharyngeal residue: Valleculae  Esophageal Impairment Domain: Esophageal Impairment Domain Esophageal clearance upright position: Complete clearance, esophageal coating Pill: Pill Consistency administered: -- (NT) Penetration/Aspiration Scale Score: Penetration/Aspiration Scale Score 1.  Material does not enter airway: Puree; Solid; Moderately thick liquids (Level 3, honey thick) 5.  Material enters airway, CONTACTS cords and not ejected out: Thin liquids (Level 0); Mildly thick liquids (Level 2, nectar thick) 7.  Material enters airway, passes BELOW cords and not ejected out despite cough attempt by patient: Thin liquids (Level 0) (with max cues) 8.  Material enters airway, passes BELOW cords without attempt by patient to eject out (silent aspiration) : Thin liquids (Level 0); Mildly thick liquids (Level 2, nectar thick) Compensatory Strategies: Compensatory Strategies Compensatory strategies: Yes Straw: Ineffective Ineffective Straw: Thin liquid (Level 0) Chin tuck: Ineffective Ineffective Chin Tuck: Thin liquid (Level 0)   General Information: Caregiver present: No  Diet Prior to this Study: Dysphagia 2 (finely chopped); Mildly thick liquids (Level 2, nectar thick)   Temperature : Febrile   Respiratory Status: WFL   Supplemental O2: Nasal cannula   History of Recent Intubation: No  Behavior/Cognition: Alert; Cooperative; Pleasant mood; Requires cueing; Distractible Self-Feeding Abilities: Able to self-feed; Needs set-up for self-feeding Baseline vocal quality/speech: Normal Volitional Cough: Able to elicit (inconsistent due to mentation) Volitional Swallow: Able to elicit No data recorded Goal Planning: Prognosis for improved oropharyngeal function: Guarded Barriers to Reach Goals: Cognitive deficits; Overall medical prognosis No data recorded Patient/Family Stated Goal: None stated No data recorded Pain: Pain Assessment Pain Assessment: Faces Faces Pain Scale: 0 Breathing: 0 Negative Vocalization: 0 Facial  Expression: 0 Body Language: 0 Consolability: 0 PAINAD Score: 0 Facial Expression: 0 Body Movements: 0 Muscle Tension: 0 Compliance with ventilator (intubated pts.): N/A Vocalization (extubated pts.): N/A CPOT Total: 0 End of Session: Start Time:SLP Start Time (ACUTE ONLY): 0940 Stop Time: SLP Stop Time (ACUTE ONLY): 1000 Time Calculation:SLP Time Calculation (min) (ACUTE ONLY): 20 min Charges: SLP Evaluations $ SLP Speech Visit: 1 Visit SLP Evaluations $BSS Swallow: 1 Procedure $MBS Swallow: 1 Procedure SLP visit diagnosis: SLP Visit Diagnosis: Dysphagia, oropharyngeal phase (R13.12) Past Medical History: Past Medical History: Diagnosis Date  Anemia   Anxiety states   Diabetes mellitus   T2DM with renal manifestations  Generalized osteoarthrosis, unspecified site   Hematuria   Hyperlipidemia   mixed  Hypertension   Irritable bowel syndrome   Other specified cardiac dysrhythmias(427.89)   atrial fibrilation  Panic disorder   Polymyalgia rheumatica   Proteinuria 05/29/10  Type II or unspecified type diabetes mellitus with renal manifestations, uncontrolled(250.42) 05/29/10  CKD II (mild) Past Surgical History: Past Surgical History: Procedure Laterality Date  CATARACT EXTRACTION    right  COLONOSCOPY N/A 11/04/2012  Procedure: COLONOSCOPY;  Surgeon: Elsie Cree, MD;  Location: [facility];  Service: Endoscopy;  Laterality: N/A;  ESOPHAGOGASTRODUODENOSCOPY N/A 11/04/2012  Procedure: ESOPHAGOGASTRODUODENOSCOPY (EGD);  Surgeon: Elsie Cree, MD;  Location: [facility];  Service: Endoscopy;  Laterality: N/A;  TUBAL LIGATION   Leah McCoy MA, CCC-SLP McCoy Leah Meryl 06/24/2024, 10:40 AM      Elgie Butter M.D. [facility] 06/25/2024, 5:30 PM  Available via Epic secure chat 7am-7pm After 7 pm, please refer to night coverage provider listed on amion.

## 2024-06-25 NOTE — Plan of Care (Signed)
  Problem: Skin Integrity: Goal: Risk for impaired skin integrity will decrease Outcome: Not Progressing   Problem: Respiratory: Goal: Will regain and/or maintain adequate ventilation Outcome: Progressing   Problem: Clinical Measurements: Goal: Respiratory complications will improve Outcome: Progressing Goal: Cardiovascular complication will be avoided Outcome: Progressing

## 2024-06-26 DIAGNOSIS — J690 Pneumonitis due to inhalation of food and vomit: Secondary | ICD-10-CM | POA: Diagnosis not present

## 2024-06-26 DIAGNOSIS — F0390 Unspecified dementia without behavioral disturbance: Secondary | ICD-10-CM | POA: Diagnosis not present

## 2024-06-26 DIAGNOSIS — R638 Other symptoms and signs concerning food and fluid intake: Secondary | ICD-10-CM

## 2024-06-26 DIAGNOSIS — R4182 Altered mental status, unspecified: Secondary | ICD-10-CM | POA: Diagnosis not present

## 2024-06-26 DIAGNOSIS — J9601 Acute respiratory failure with hypoxia: Secondary | ICD-10-CM | POA: Diagnosis not present

## 2024-06-26 DIAGNOSIS — E1010 Type 1 diabetes mellitus with ketoacidosis without coma: Secondary | ICD-10-CM | POA: Diagnosis not present

## 2024-06-26 DIAGNOSIS — Z515 Encounter for palliative care: Secondary | ICD-10-CM | POA: Diagnosis not present

## 2024-06-26 DIAGNOSIS — Z66 Do not resuscitate: Secondary | ICD-10-CM | POA: Diagnosis not present

## 2024-06-26 LAB — GLUCOSE, CAPILLARY
Glucose-Capillary: 120 mg/dL — ABNORMAL HIGH (ref 70–99)
Glucose-Capillary: 129 mg/dL — ABNORMAL HIGH (ref 70–99)
Glucose-Capillary: 163 mg/dL — ABNORMAL HIGH (ref 70–99)
Glucose-Capillary: 266 mg/dL — ABNORMAL HIGH (ref 70–99)

## 2024-06-26 NOTE — Plan of Care (Signed)
  Problem: Metabolic: Goal: Ability to maintain appropriate glucose levels will improve Outcome: Progressing   Problem: Nutritional: Goal: Maintenance of adequate nutrition will improve Outcome: Progressing   Problem: Clinical Measurements: Goal: Respiratory complications will improve Outcome: Progressing Goal: Cardiovascular complication will be avoided Outcome: Progressing

## 2024-06-26 NOTE — Progress Notes (Signed)
 Daily Progress Note   Patient Name: Kristen Bowman       Date: 06/26/2024 DOB: 09/02/1930  Age: 89 y.o. MRN#: [id number] Attending Physician: Cherlyn Labella, MD Primary Care Physician: Valentin Skates, DO Admit Date: 06/15/2024  Reason for Consultation/Follow-up: Establishing goals of care  Subjective: I have reviewed medical records including EPIC notes, MAR, and labs. Received report from primary RN -no acute concerns.  RN reports that patient continues with minimal oral intake and does not want to be messed with.  Per RN patient is telling people to leave me alone.  Went to visit patient at bedside-no family/visitors present.  Patient was lying in bed asleep-I did not attempt to wake to preserve comfort. No signs or non-verbal gestures of pain or discomfort noted. No respiratory distress, increased work of breathing, or secretions noted.  She is on 2 L O2 nasal cannula.  1:34 PM Attempted to call granddaughter/Lisa to follow-up on discharge planning regarding home versus residential hospice and possibly comfort care in house - no answer - confidential voicemail left and PMT phone number provided with request to return call.  1:36 PM Attempted to call daughter/Carol to follow-up on discharge planning regarding home versus residential hospice and possibly comfort care in house - no answer - confidential voicemail left and PMT phone number provided with request to return call.  3:45 PM Dr. Cherlyn was able to speak with granddaughter/Lisa -[name] request PMT meeting tomorrow at 1 PM.  Length of Stay: 11  Current Medications: Scheduled Meds:   amLODipine   10 mg Oral Daily   clonazePAM   0.5 mg Oral Daily   divalproex   250 mg Oral BID   donepezil   5 mg Oral QHS   feeding supplement  237 mL  Oral BID BM   heparin  injection (subcutaneous)  5,000 Units Subcutaneous Q8H   insulin  aspart  0-5 Units Subcutaneous QHS   insulin  aspart  0-9 Units Subcutaneous TID WC   irbesartan  300 mg Oral Daily   metoprolol  tartrate  75 mg Oral BID   nystatin  5 mL Oral QID    Continuous Infusions:   PRN Meds: acetaminophen , dextrose , hydrALAZINE , labetalol   Physical Exam Vitals and nursing note reviewed.  Constitutional:      General: She is not in acute distress. Pulmonary:     Effort: No respiratory  distress.  Skin:    General: Skin is warm and dry.  Neurological:     Mental Status: She is lethargic.             Vital Signs: BP (!) 124/42 (BP Location: Right Arm)   Pulse 63   Temp 97.6 F (36.4 C) (Axillary)   Resp 19   Ht 5' 6 (1.676 m)   Wt 69 kg   SpO2 100%   BMI 24.55 kg/m  SpO2: SpO2: 100 % O2 Device: O2 Device: Nasal Cannula O2 Flow Rate: O2 Flow Rate (L/min): 3 L/min  Intake/output summary:  Intake/Output Summary (Last 24 hours) at 06/26/2024 1310 Last data filed at 06/26/2024 0436 Gross per 24 hour  Intake --  Output 1700 ml  Net -1700 ml   LBM: Last BM Date : 06/25/24 Baseline Weight: Weight: 74.8 kg Most recent weight: Weight: 69 kg       Palliative Assessment/Data: PPS 20%      Patient Active Problem List   Diagnosis Date Noted   DKA (diabetic ketoacidosis) (HCC) 06/22/2024   Acute hypoxic respiratory failure (HCC) 06/22/2024   Aspiration pneumonia (HCC) 06/22/2024   Hypernatremia 06/22/2024   Elevated troponin 06/16/2024   Pressure injury of skin 06/16/2024   AMS (altered mental status) 06/15/2024   UTI (urinary tract infection) 03/27/2024   Acute encephalopathy 03/26/2024   Diabetes mellitus type 2 in nonobese (HCC) 03/26/2024   Dementia without behavioral disturbance (HCC) 10/17/2019   Pulmonary hypertension (HCC) 06/09/2014   Tachycardia 09/24/2012   Palpitations 09/24/2012   CKD (chronic kidney disease), stage II 09/24/2012    Anemia, iron  deficiency 09/24/2012   Primary hypertension    Panic disorder    Hyperlipidemia    Type II or unspecified type diabetes mellitus with renal manifestations, uncontrolled(250.42) 05/29/2010    Palliative Care Assessment & Plan   Patient Profile: 89 y.o. female  with past medical history of diabetes mellitus type 2, hypertension, history of E. coli UTI and associated encephalopathy, right bundle branch block, chronic anemia, chronic kidney disease stage II, anemia,  dementia, depression was admitted on 06/15/2024 with AMS secondary to metabolic encephalopathy, DKA/DM II, AKI on CKD II/dehydration, sepsis secondary to aspiration pneumonia and UTI, acute hypoxic respiratory failure.    Of note, patient has had 2 admissions and 1 ED visit in the last 6 months.  Assessment: Principal Problem:   AMS (altered mental status) Active Problems:   Tachycardia   Primary hypertension   Dementia without behavioral disturbance (HCC)   Elevated troponin   Pressure injury of skin   DKA (diabetic ketoacidosis) (HCC)   Acute hypoxic respiratory failure (HCC)   Aspiration pneumonia (HCC)   Hypernatremia   Concern about end-of-life  Recommendations/Plan: Continue current gentle medical treatment Continue DNR/DNI as previously documented Granddaughter requests PMT meeting tomorrow 11/2, scheduled for 1pm PMT will continue to follow and support holistically  Goals of Care and Additional Recommendations: Limitations on Scope of Treatment: Avoid Hospitalization and No Tracheostomy  Code Status:    Code Status Orders  (From admission, onward)           Start     Ordered   06/16/24 0217  Do not attempt resuscitation (DNR)- Limited -Do Not Intubate (DNI)  (Code Status)  Continuous       Question Answer Comment  If pulseless and not breathing No CPR or chest compressions.   In Pre-Arrest Conditions (Patient Is Breathing and Has A Pulse) Do not intubate. Provide all appropriate  non-invasive medical interventions. Avoid ICU transfer unless indicated or required.   Consent: Discussion documented in EHR or advanced directives reviewed      06/16/24 0216           Code Status History     Date Active Date Inactive Code Status Order ID Comments User Context   06/15/2024 1727 06/16/2024 0216 Full Code [id number]  Tobie Mario GAILS, MD ED   03/27/2024 0322 04/01/2024 1652 Full Code [id number]  Franky Redia SAILOR, MD Inpatient   11/13/2019 0754 11/20/2019 1648 Full Code [id number]  Cottie Donnice PARAS, MD ED   10/17/2019 0236 10/22/2019 0204 Full Code [id number]  Vicky Lamar RIGGERS ED   09/25/2012 0051 09/26/2012 1638 Full Code [id number]  Cruise, Delon RAMAN, RN Inpatient      Advance Directive Documentation    Flowsheet Row Most Recent Value  Type of Advance Directive Healthcare Power of Attorney  Pre-existing out of facility DNR order (yellow form or pink MOST form) --  MOST Form in Place? --    Prognosis:  weeks  Discharge Planning: To Be Determined  Care plan was discussed with primary RN, Dr. Cherlyn, [facility]  Thank you for allowing the Palliative Medicine Team to assist in the care of this patient.   Total Time 35 minutes Prolonged Time Billed  no       Jeoffrey CHRISTELLA Sharps, NP  Please contact Palliative Medicine Team phone at [phone] for questions and concerns.   *Portions of this note are a verbal dictation therefore any spelling and/or grammatical errors are due to the Dragon [employer] One system interpretation.

## 2024-06-26 NOTE — Progress Notes (Signed)
 [facility]                                                                               Kristen Bowman, is a 89 y.o. female, DOB - Aug 14, 1931, FMW:[id number] Admit date - 06/15/2024    Outpatient Primary MD for the patient is Valentin Skates, DO  LOS - 11  days    Brief summary    The patient is a 89 year old female on hospice care with a PMHx of dementia, CKD stage II, T2DM, HTN, anemia who presented to the ED on 06/15/2024 from [facility] for increased lethargy, hypoxia, and hyperglycemia, found to be in DKA with acute hypoxic respiratory failure and sepsis secondary to aspiration pneumonia.    Assessment & Plan    Assessment and Plan:    # Acute hypoxic respiratory failure - improving - Presented with SpO2 of 88% on RA with tachypnea in the 20s. Initially, likely secondary to aspiration pneumonia.  - She is still requiring supplemental O2 at this time, suspect she is somewhat volume overloaded (net +3.9L since admission) given amount of fluids she received for DKA, noted mild LE edema and bilateral crackles on exam - slightly fluid overloaded, was given 2 doses of IV lasix and 1600 ml urine out yesterday.  -she remains on oxygen, led edema improving with IV lasix.  But her repeat CXR shows worsening of the left lung and right lung base opacities, suspect ongoing aspiration despite change in diet and antibiotics.  - SLP eval done, MBS done, showing worsening of her dysphagia due to her cognitive impairment.  - she remains on 3 to 4 lit of [place] oxygen.  Family is still deciding about home hospice vs residential hospice.     # Sepsis  - Presented with leukocytosis to 12.6, tachypnea 27, lactic acid elevated to 3.4 with AMS and AKI, with concern for underlying aspiration pneumonia and UTI - resolved.    # Aspiration pneumonia - Concern for aspiration pneumonia given encephalopathy with findings concerning for old emesis on oral care  - CXR (10/22) showing new  confluent new retrocardiac opacity on the left compatible with new or increased left lower lung collapse or consolidation - RPR negative - Procalcitonin 0.21 on 10/22 - she remains afebrile, with WBC count wnl.  - but her repeat CXR shows worsening of the opacities, suspect continual aspiration .  - discussed with the patient's grand daughter over the phone.    #Hypertension Optimal BP parameters.  Continue with amlodipine , avapro and metoprolol  75 mg BID.    # Hypernatremia - resolved - Initially elevated Na in the setting of dehydration and osmotic diuresis from DKA then resolved. Sodium wnl.    # Dysphagia - Patient presented with encephalopathy secondary to DKA complicated by aspiration pneumonia. Her mental status has improved. However, she remains dysphagic. SLP is following and has recommended a dysphagia 1 diet with nectar thick liquids. -  Family not interested in Cortrak anymore. MBS done, and showed worsening dysphagia.    # DKA - resolved Uncontrolled DM with episodes of of hypoglycemia - Presented with hyperglycemia to 770s in the setting of not receiving insulin  at SNF during 5-day  stay. BhB elevated to > 8 on admission - BhB wnl - CBG (last 3)  Recent Labs    06/25/24 2107 06/26/24 0721 06/26/24 1154  GLUCAP 160* 120* 129*   Stopped all insulin  regimens except for SSI as patient has poor oral intake.    #Lactic acidosis - resolved - Episodes of recurrent lactic acidosis, possibly secondary to aspiration pneumonia and dehydration -  resolved.    # High anion gap metabolic acidosis - resolved - Likely secondary to lactic acidosis and DKA - Continue management of underlying causes as above   # Possible UTI - UA concerning for UTI. Patient encephalopathic and unable to reliably communicate symptoms - History of ESBL E. Coli - Urine culture showing greater than 100,000 colonies of multiple species and suggested recollection.  However, patient receiving  antibiotics with clinical improvement, so repeat UA/UCx futile at this point - Completed 7 days of meropenem   # Hypokalemia - resolved   #Acute metabolic encephalopathy  - Likely secondary to acute illness caused by aspiration pneumonia in the setting of underlying dementia - she is oriented to person only , when awake.  - discussed about getting another CT  head, but family is considering hospice.  - she is refusing to get up and eat meals most of the time. She is only taking food when family is at bedside.    #AKI on CKD stage IIIa - resolved - Creatinine elevated 2.74 on admission, baseline around 0.9 - Likely prerenal in the setting of dehydration from vomiting and osmotic diuresis - Improved with IVFs - creatinine stable around 0.8   #Elevated troponins - High-sensitivity troponins peaked at 192 - Likely secondary to acute illness, aspiration pneumonia, DKA  - Echo showed LVEF 70 to 75% with no regional wall motion normalities.  Noted severe asymmetric left ventricular hypertrophy of the basal septal segment - Cardiology evaluated - indicated unlikely ACS if no abnormalities on echo - she was started on heparin  gtt and discontinued on 10/22.  - currently chest pain free.    #Dementia #Anxiety #Depression - Home meds include sertraline  50 mg daily, Klonopin  0.5 mg daily, Depakote  125 mg twice daily, donepezil  5 mg daily, trazodone  50 mg nightly, risperidone  0.5 mg nightly. Initially held due to AMS and NPO.  Patient's grand daughter is concerned that patient remains sleepy most of the time, we discussed about decreasing the dose of clonazepam  but family does not  want to do it as she has been on these meds for more than 10 years. She continues to refuse getting up and has minimal oral intake.  Patient continues to worsen despite completing antibiotics.      RN Pressure Injury Documentation: Wound 06/23/24 1430 Pressure Injury Coccyx Mid Stage 3 -  Full thickness tissue loss.  Subcutaneous fat may be visible but bone, tendon or muscle are NOT exposed. (Active)     Wound 06/23/24 1430 Pressure Injury Heel Left Deep Tissue Pressure Injury - Purple or maroon localized area of discolored intact skin or blood-filled blister due to damage of underlying soft tissue from pressure and/or shear. (Active)  Wound care consulted and recommendations given.   Malnutrition Type:  Nutrition Problem: Inadequate oral intake Etiology: chronic illness (dementia)   Malnutrition Characteristics:  Signs/Symptoms: per patient/family report   Nutrition Interventions:  Interventions: Ensure Enlive (each supplement provides 350kcal and 20 grams of protein), Tube feeding  Estimated body mass index is 24.55 kg/m as calculated from the following:   Height as of this  encounter: 5' 6 (1.676 m).   Weight as of this encounter: 69 kg.  Code Status: DNR limited.  DVT Prophylaxis:  heparin  injection 5,000 Units Start: 06/16/24 2200   Level of Care: Level of care: Progressive Family Communication: none at bedside today. Discussed with daughter over the phone.   Disposition Plan:     Remains inpatient appropriate:  home with hospice.   Procedures:  None.   Consultants:   Palliative care.   Antimicrobials:   Anti-infectives (From admission, onward)    Start     Dose/Rate Route Frequency Ordered Stop   06/17/24 2200  vancomycin  (VANCOCIN ) IVPB 1000 mg/200 mL premix  Status:  Discontinued        1,000 mg 200 mL/hr over 60 Minutes Intravenous Every 48 hours 06/16/24 0935 06/16/24 1810   06/16/24 0400  meropenem (MERREM) 1 g in sodium chloride  0.9 % 100 mL IVPB        1 g 200 mL/hr over 30 Minutes Intravenous Every 12 hours 06/16/24 0335 06/22/24 2106   06/15/24 1900  vancomycin  (VANCOREADY) IVPB 1500 mg/300 mL        1,500 mg 150 mL/hr over 120 Minutes Intravenous  Once 06/15/24 1853 06/15/24 2357   06/15/24 1853  vancomycin  variable dose per unstable renal function (pharmacist  dosing)  Status:  Discontinued         Does not apply See admin instructions 06/15/24 1853 06/16/24 0935   06/15/24 1845  cefTRIAXone  (ROCEPHIN ) 2 g in sodium chloride  0.9 % 100 mL IVPB  Status:  Discontinued       Note to Pharmacy: Please confirm patient has had blood and urine cultures prior to antibiotic administration.   2 g 200 mL/hr over 30 Minutes Intravenous Every 24 hours 06/15/24 1836 06/16/24 0335        Medications  Scheduled Meds:  amLODipine   10 mg Oral Daily   clonazePAM   0.5 mg Oral Daily   divalproex   250 mg Oral BID   donepezil   5 mg Oral QHS   feeding supplement  237 mL Oral BID BM   heparin  injection (subcutaneous)  5,000 Units Subcutaneous Q8H   insulin  aspart  0-5 Units Subcutaneous QHS   insulin  aspart  0-9 Units Subcutaneous TID WC   irbesartan  300 mg Oral Daily   metoprolol  tartrate  75 mg Oral BID   nystatin  5 mL Oral QID   Continuous Infusions: PRN Meds:.acetaminophen , dextrose , hydrALAZINE , labetalol     Subjective:   Kristen Bowman was seen and examined today.    Opened her eyes , smiled, said she is not hungry. Wants to go back to sleep.   Objective:   Vitals:   06/26/24 0435 06/26/24 0723 06/26/24 0951 06/26/24 1156  BP: (!) 132/47 104/83 104/83 (!) 124/42  Pulse: 76 73 73 63  Resp: 16 16  19   Temp: 98 F (36.7 C) 98.1 F (36.7 C)  97.6 F (36.4 C)  TempSrc: Oral Axillary  Axillary  SpO2: 97% 98%  100%  Weight:      Height:        Intake/Output Summary (Last 24 hours) at 06/26/2024 1242 Last data filed at 06/26/2024 0436 Gross per 24 hour  Intake --  Output 1700 ml  Net -1700 ml   Filed Weights   06/15/24 1552 06/15/24 1953 06/21/24 1113  Weight: 74.8 kg 62.8 kg 69 kg     Exam  General exam: Appears calm and comfortable  Respiratory system: Clear to auscultation. Respiratory effort  normal. Cardiovascular system: S1 & S2 heard, RRR. No JVD Gastrointestinal system: Abdomen is soft bs+ Central nervous system:  somnolent,  most of the time. Wakes up to answer questions,  Extremities: Symmetric 5 x 5 power. Skin: No rashes, pressure injury stage 3 on coccyx and left heel dti.     Data Reviewed:  I have personally reviewed following labs and imaging studies   CBC Lab Results  Component Value Date   WBC 5.6 06/24/2024   RBC 3.41 (L) 06/24/2024   HGB 9.6 (L) 06/24/2024   HCT 29.9 (L) 06/24/2024   MCV 87.7 06/24/2024   MCH 28.2 06/24/2024   PLT 218 06/24/2024   MCHC 32.1 06/24/2024   RDW 15.4 06/24/2024   LYMPHSABS 1.1 06/15/2024   MONOABS 1.0 06/15/2024   EOSABS 0.0 06/15/2024   BASOSABS 0.0 06/15/2024     Last metabolic panel Lab Results  Component Value Date   NA 136 06/24/2024   K 4.0 06/24/2024   CL 98 06/24/2024   CO2 26 06/24/2024   BUN 21 06/24/2024   CREATININE 0.86 06/24/2024   GLUCOSE 110 (H) 06/24/2024   GFRNONAA >60 06/24/2024   GFRAA 55 (L) 01/26/2020   CALCIUM  8.2 (L) 06/24/2024   PHOS 2.3 (L) 06/18/2024   PROT 5.7 (L) 06/15/2024   PROT 5.7 (L) 06/15/2024   ALBUMIN  2.5 (L) 06/15/2024   ALBUMIN  2.5 (L) 06/15/2024   BILITOT 2.3 (H) 06/15/2024   BILITOT 2.2 (H) 06/15/2024   ALKPHOS 65 06/15/2024   ALKPHOS 65 06/15/2024   AST 14 (L) 06/15/2024   AST 14 (L) 06/15/2024   ALT 11 06/15/2024   ALT 11 06/15/2024   ANIONGAP 12 06/24/2024    CBG (last 3)  Recent Labs    06/25/24 2107 06/26/24 0721 06/26/24 1154  GLUCAP 160* 120* 129*      Coagulation Profile: No results for input(s): INR, PROTIME in the last 168 hours.   Radiology Studies: No results found.      Elgie Butter M.D. [facility] 06/26/2024, 12:42 PM  Available via Epic secure chat 7am-7pm After 7 pm, please refer to night coverage provider listed on amion.

## 2024-06-27 DIAGNOSIS — R4182 Altered mental status, unspecified: Secondary | ICD-10-CM | POA: Diagnosis not present

## 2024-06-27 DIAGNOSIS — Z66 Do not resuscitate: Secondary | ICD-10-CM | POA: Diagnosis not present

## 2024-06-27 DIAGNOSIS — Z515 Encounter for palliative care: Secondary | ICD-10-CM | POA: Diagnosis not present

## 2024-06-27 DIAGNOSIS — Z711 Person with feared health complaint in whom no diagnosis is made: Secondary | ICD-10-CM

## 2024-06-27 DIAGNOSIS — Z7189 Other specified counseling: Secondary | ICD-10-CM | POA: Diagnosis not present

## 2024-06-27 DIAGNOSIS — E1010 Type 1 diabetes mellitus with ketoacidosis without coma: Secondary | ICD-10-CM | POA: Diagnosis not present

## 2024-06-27 LAB — GLUCOSE, CAPILLARY
Glucose-Capillary: 309 mg/dL — ABNORMAL HIGH (ref 70–99)
Glucose-Capillary: 274 mg/dL — ABNORMAL HIGH (ref 70–99)
Glucose-Capillary: 179 mg/dL — ABNORMAL HIGH (ref 70–99)
Glucose-Capillary: 232 mg/dL — ABNORMAL HIGH (ref 70–99)

## 2024-06-27 MED ORDER — INSULIN GLARGINE-YFGN 100 UNIT/ML ~~LOC~~ SOLN
8.0000 [IU] | SUBCUTANEOUS | Status: DC
  Administered 2024-06-27 – 2024-06-29 (×3): 8 [IU] via SUBCUTANEOUS
  Filled 2024-06-27 (×3): qty 0.08

## 2024-06-27 NOTE — Progress Notes (Signed)
 Daily Progress Note   Patient Name: Kristen Bowman       Date: 06/27/2024 DOB: [birth year]  Age: 89 y.o. MRN#: [id number] Attending Physician: Cherlyn Labella, MD Primary Care Physician: Valentin Skates, DO Admit Date: 06/15/2024  Reason for Consultation/Follow-up: Establishing goals of care  Subjective: I have reviewed medical records including EPIC notes, MAR, and labs.  Discussed case with Dr. Cherlyn.   Went to visit patient at bedside -no family/visitors present.  Patient was lying in bed asleep-did not attempt to wake to preserve comfort. No signs or non-verbal gestures of pain or discomfort noted. No respiratory distress, increased work of breathing, or secretions noted.   12:30 PM Received notification that granddaughter/Lisa called PMT phone indicating that she was not feeling well and would prefer meeting this afternoon be held via phone.  1:00 PM Called granddaughter/Lisa -emotional support provided.  She indicates that she and her mother/patient's daughter/Carol have spoken briefly.  They have been considering discharge back home with hospice versus inpatient hospice facility.  They decided that they would like patient discharged back home with hospice.  We did discuss hospice philosophy on medications per her request.  Therapeutic listening provided as she reflects on patient's ongoing decline despite hospital interventions.  Family have been considering options of reenrolling patient with [facility] versus switching to [facility].  If patient needed hospice facility in the future, they would want to utilize [facility] as this is close to their home. Informed [name] that patient is ready for discharge and medical team would need final decisions to assist in coordinating whichever  organization they choose.  [name] wishes to continue this discussion with [name] prior to making final decisions.  She request that someone call her tomorrow between 12 and 1 PM for their final decision on [facility] versus [facility]. Otherwise, they are ready for her to return home.  All questions and concerns addressed. Encouraged to call with questions and/or concerns. PMT card provided.  Discussed with TOC and Dr. Cherlyn.  Length of Stay: 12  Current Medications: Scheduled Meds:   amLODipine   10 mg Oral Daily   clonazePAM   0.5 mg Oral Daily   divalproex   250 mg Oral BID   donepezil   5 mg Oral QHS   feeding supplement  237 mL Oral BID BM   heparin  injection (subcutaneous)  5,000 Units Subcutaneous Q8H   insulin  aspart  0-5 Units Subcutaneous QHS   insulin  aspart  0-9 Units Subcutaneous TID WC   insulin  glargine-yfgn  8 Units Subcutaneous Q24H   irbesartan  300 mg Oral Daily   metoprolol  tartrate  75 mg Oral BID   nystatin  5 mL Oral QID    Continuous Infusions:   PRN Meds: acetaminophen , dextrose , hydrALAZINE , labetalol   Physical Exam Vitals and nursing note reviewed.  Constitutional:      General: She is not in acute distress. Pulmonary:     Effort: No respiratory distress.  Skin:    General: Skin is warm and dry.  Neurological:     Mental Status: She is lethargic.             Vital Signs: BP (!) 117/43 (BP Location: Right Arm)   Pulse 67   Temp 98.8 F (37.1 C) (Axillary)   Resp (!) 22   Ht 5' 6 (1.676 m)   Wt 69 kg   SpO2 91%   BMI 24.55 kg/m  SpO2: SpO2: 91 % O2 Device: O2 Device: Nasal Cannula O2 Flow Rate: O2 Flow Rate (L/min): 2 L/min  Intake/output summary:  Intake/Output Summary (Last 24 hours) at 06/27/2024 1305 Last data filed at 06/27/2024 1225 Gross per 24 hour  Intake 360 ml  Output 1375 ml  Net -1015 ml   LBM: Last BM Date : 06/26/24 Baseline Weight: Weight: 74.8 kg Most recent weight: Weight: 69 kg       Palliative  Assessment/Data: PPS 20%      Patient Active Problem List   Diagnosis Date Noted   DKA (diabetic ketoacidosis) (HCC) 06/22/2024   Acute hypoxic respiratory failure (HCC) 06/22/2024   Aspiration pneumonia (HCC) 06/22/2024   Hypernatremia 06/22/2024   Elevated troponin 06/16/2024   Pressure injury of skin 06/16/2024   AMS (altered mental status) 06/15/2024   UTI (urinary tract infection) 03/27/2024   Acute encephalopathy 03/26/2024   Diabetes mellitus type 2 in nonobese (HCC) 03/26/2024   Dementia without behavioral disturbance (HCC) 10/17/2019   Pulmonary hypertension (HCC) 06/09/2014   Tachycardia 09/24/2012   Palpitations 09/24/2012   CKD (chronic kidney disease), stage II 09/24/2012   Anemia, iron  deficiency 09/24/2012   Primary hypertension    Panic disorder    Hyperlipidemia    Type II or unspecified type diabetes mellitus with renal manifestations, uncontrolled(250.42) 05/29/2010    Palliative Care Assessment & Plan   Patient Profile: 89 y.o. female  with past medical history of diabetes mellitus type 2, hypertension, history of E. coli UTI and associated encephalopathy, right bundle branch block, chronic anemia, chronic kidney disease stage II, anemia,  dementia, depression was admitted on 06/15/2024 with AMS secondary to metabolic encephalopathy, DKA/DM II, AKI on CKD II/dehydration, sepsis secondary to aspiration pneumonia and UTI, acute hypoxic respiratory failure.    Of note, patient has had 2 admissions and 1 ED visit in the last 6 months.  Assessment: Principal Problem:   AMS (altered mental status) Active Problems:   Tachycardia   Primary hypertension   Dementia without behavioral disturbance (HCC)   Elevated troponin   Pressure injury of skin   DKA (diabetic ketoacidosis) (HCC)   Acute hypoxic respiratory failure (HCC)   Aspiration pneumonia (HCC)   Hypernatremia   Concern about end-of-life  Recommendations/Plan: Continue current gentle medical  treatment Continue DNR/DNI as previously documented Plan for patient is discharged back home with hospice care, likely tomorrow 11/3 Family unsure if they would want  to reenroll with [facility] versus transition to [facility].  Granddaughter/Lisa requests that someone call her between 12-1pm tomorrow for final decision - TOC to call and coordinate PMT will continue to follow peripherally. If there are any imminent needs please call the service directly  Goals of Care and Additional Recommendations: Limitations on Scope of Treatment: Full Comfort Care  Code Status:    Code Status Orders  (From admission, onward)           Start     Ordered   06/16/24 0217  Do not attempt resuscitation (DNR)- Limited -Do Not Intubate (DNI)  (Code Status)  Continuous       Question Answer Comment  If pulseless and not breathing No CPR or chest compressions.   In Pre-Arrest Conditions (Patient Is Breathing and Has A Pulse) Do not intubate. Provide all appropriate non-invasive medical interventions. Avoid ICU transfer unless indicated or required.   Consent: Discussion documented in EHR or advanced directives reviewed      06/16/24 0216           Code Status History     Date Active Date Inactive Code Status Order ID Comments User Context   06/15/2024 1727 06/16/2024 0216 Full Code [id number]  Tobie Mario GAILS, MD ED   03/27/2024 0322 04/01/2024 1652 Full Code [id number]  Franky Redia SAILOR, MD Inpatient   11/13/2019 0754 11/20/2019 1648 Full Code [id number]  Cottie Donnice PARAS, MD ED   10/17/2019 0236 10/22/2019 0204 Full Code [id number]  Vicky Lamar RIGGERS ED   09/25/2012 0051 09/26/2012 1638 Full Code [id number]  Cruise, Delon RAMAN, RN Inpatient      Advance Directive Documentation    Flowsheet Row Most Recent Value  Type of Advance Directive Healthcare Power of Attorney  Pre-existing out of facility DNR order (yellow form or pink MOST form) --  MOST Form in Place? --    Prognosis:   Weeks  Discharge Planning: Home with Hospice  Care plan was discussed with patient's granddaughter, Dr. Cherlyn, [facility]  Thank you for allowing the Palliative Medicine Team to assist in the care of this patient.   Total Time 50 minutes Prolonged Time Billed  no       Jeoffrey CHRISTELLA Sharps, NP  Please contact Palliative Medicine Team phone at [phone] for questions and concerns.   *Portions of this note are a verbal dictation therefore any spelling and/or grammatical errors are due to the Dragon Medical One system interpretation.

## 2024-06-27 NOTE — Progress Notes (Signed)
 [facility]                                                                               Kristen Bowman, is a 89 y.o. female, DOB - [birth year], FMW:[id number] Admit date - 06/15/2024    Outpatient Primary MD for the patient is Valentin Skates, DO  LOS - 12  days    Brief summary    The patient is a 89 year old female on hospice care with a PMHx of dementia, CKD stage II, T2DM, HTN, anemia who presented to the ED on 06/15/2024 from [facility] for increased lethargy, hypoxia, and hyperglycemia, found to be in DKA with acute hypoxic respiratory failure and sepsis secondary to aspiration pneumonia.  No new events overnight.  Plan for discharge home with hospice tomorrow afternoon after daughter and grand daughter decides.    Assessment & Plan    Assessment and Plan:    # Acute hypoxic respiratory failure - improving - Presented with SpO2 of 88% on RA with tachypnea in the 20s. Initially, likely secondary to aspiration pneumonia.  - She is still requiring supplemental O2 at this time, suspect she is somewhat volume overloaded (net +3.9L since admission) given amount of fluids she received for DKA, noted mild LE edema and bilateral crackles on exam - slightly fluid overloaded, was given 2 doses of IV lasix and 1600 ml urine out yesterday.  -she remains on oxygen, led edema improving with IV lasix.  But her repeat CXR shows worsening of the left lung and right lung base opacities, suspect ongoing aspiration despite change in diet and antibiotics.  - SLP eval done, MBS done, showing worsening of her dysphagia due to her cognitive impairment.  - she remains on 3 to 4 lit of [place] oxygen.  Family is still deciding about home hosp[facility].  She continuous to decline .     # Sepsis  - Presented with leukocytosis to 12.6, tachypnea 27, lactic acid elevated to 3.4 with AMS and AKI, with concern for underlying aspiration pneumonia and UTI -  resolved.    # Aspiration pneumonia - Concern for aspiration pneumonia given encephalopathy with findings concerning for old emesis on oral care  - CXR (10/22) showing new confluent new retrocardiac opacity on the left compatible with new or increased left lower lung collapse or consolidation - RPR negative - Procalcitonin 0.21 on 10/22 - she remains afebrile, with WBC count wnl.  - but her repeat CXR shows worsening of the opacities, suspect continual aspiration .  - discussed with the patient's grand daughter over the phone.    #Hypertension Optimal BP parameters.  Continue with amlodipine , avapro and metoprolol  75 mg BID.    # Hypernatremia - resolved - Initially elevated Na in the setting of dehydration and osmotic diuresis from DKA then resolved. Sodium wnl.    # Dysphagia - Patient presented with encephalopathy secondary to DKA complicated by aspiration pneumonia. Her mental status has improved. However, she remains dysphagic. SLP is following and has recommended a dysphagia 1 diet with nectar thick liquids. -  Family not interested in Cortrak anymore. MBS done, and showed worsening dysphagia.    #  DKA - resolved Uncontrolled DM with episodes of of hypoglycemia - Presented with hyperglycemia to 770s in the setting of not receiving insulin  at SNF during 5-day stay. BhB elevated to > 8 on admission - BhB wnl - CBG (last 3)  Recent Labs    06/26/24 2111 06/27/24 0812 06/27/24 1221  GLUCAP 266* 309* 274*   Stopped all insulin  regimens except for SSI as patient has poor oral intake.    #Lactic acidosis - resolved - Episodes of recurrent lactic acidosis, possibly secondary to aspiration pneumonia and dehydration -  resolved.    # High anion gap metabolic acidosis - resolved - Likely secondary to lactic acidosis and DKA - Continue management of underlying causes as above   # Possible UTI - UA concerning for UTI. Patient encephalopathic and unable to reliably communicate  symptoms - History of ESBL E. Coli - Urine culture showing greater than 100,000 colonies of multiple species and suggested recollection.  However, patient receiving antibiotics with clinical improvement, so repeat UA/UCx futile at this point - Completed 7 days of meropenem   # Hypokalemia - resolved   #Acute metabolic encephalopathy  - Likely secondary to acute illness caused by aspiration pneumonia in the setting of underlying dementia - she is oriented to person only , when awake.  - discussed about getting another CT  head, but family is considering hospice.  - she is refusing to get up and eat meals most of the time. She is only taking food when family is at bedside.    #AKI on CKD stage IIIa - resolved - Creatinine elevated 2.74 on admission, baseline around 0.9 - Likely prerenal in the setting of dehydration from vomiting and osmotic diuresis - Improved with IVFs - creatinine stable around 0.8   #Elevated troponins - High-sensitivity troponins peaked at 192 - Likely secondary to acute illness, aspiration pneumonia, DKA  - Echo showed LVEF 70 to 75% with no regional wall motion normalities.  Noted severe asymmetric left ventricular hypertrophy of the basal septal segment - Cardiology evaluated - indicated unlikely ACS if no abnormalities on echo - she was started on heparin  gtt and discontinued on 10/22.  - currently chest pain free.    #Dementia #Anxiety #Depression - Home meds include sertraline  50 mg daily, Klonopin  0.5 mg daily, Depakote  125 mg twice daily, donepezil  5 mg daily, trazodone  50 mg nightly, risperidone  0.5 mg nightly. Initially held due to AMS and NPO.  Patient's grand daughter is concerned that patient remains sleepy most of the time, we discussed about decreasing the dose of clonazepam  but family does not  want to do it as she has been on these meds for more than 10 years. She continues to refuse getting up and has minimal oral intake.  Patient continues to  decline  despite completing antibiotics.      RN Pressure Injury Documentation: Wound 06/23/24 1430 Pressure Injury Coccyx Mid Stage 3 -  Full thickness tissue loss. Subcutaneous fat may be visible but bone, tendon or muscle are NOT exposed. (Active)     Wound 06/23/24 1430 Pressure Injury Heel Left Deep Tissue Pressure Injury - Purple or maroon localized area of discolored intact skin or blood-filled blister due to damage of underlying soft tissue from pressure and/or shear. (Active)  Wound care consulted and recommendations given.   Malnutrition Type:  Nutrition Problem: Inadequate oral intake Etiology: chronic illness (dementia)   Malnutrition Characteristics:  Signs/Symptoms: per patient/family report   Nutrition Interventions:  Interventions: Ensure Enlive (each supplement  provides 350kcal and 20 grams of protein), Tube feeding  Estimated body mass index is 24.55 kg/m as calculated from the following:   Height as of this encounter: 5' 6 (1.676 m).   Weight as of this encounter: 69 kg.  Code Status: DNR limited.  DVT Prophylaxis:  heparin  injection 5,000 Units Start: 06/16/24 2200   Level of Care: Level of care: Progressive Family Communication: none at bedside today. Discussed with daughter over the phone.   Disposition Plan:     Remains inpatient appropriate:  home with hospice.   Procedures:  None.   Consultants:   Palliative care.   Antimicrobials:   Anti-infectives (From admission, onward)    Start     Dose/Rate Route Frequency Ordered Stop   06/17/24 2200  vancomycin  (VANCOCIN ) IVPB 1000 mg/200 mL premix  Status:  Discontinued        1,000 mg 200 mL/hr over 60 Minutes Intravenous Every 48 hours 06/16/24 0935 06/16/24 1810   06/16/24 0400  meropenem (MERREM) 1 g in sodium chloride  0.9 % 100 mL IVPB        1 g 200 mL/hr over 30 Minutes Intravenous Every 12 hours 06/16/24 0335 06/22/24 2106   06/15/24 1900  vancomycin  (VANCOREADY) IVPB 1500 mg/300 mL         1,500 mg 150 mL/hr over 120 Minutes Intravenous  Once 06/15/24 1853 06/15/24 2357   06/15/24 1853  vancomycin  variable dose per unstable renal function (pharmacist dosing)  Status:  Discontinued         Does not apply See admin instructions 06/15/24 1853 06/16/24 0935   06/15/24 1845  cefTRIAXone  (ROCEPHIN ) 2 g in sodium chloride  0.9 % 100 mL IVPB  Status:  Discontinued       Note to Pharmacy: Please confirm patient has had blood and urine cultures prior to antibiotic administration.   2 g 200 mL/hr over 30 Minutes Intravenous Every 24 hours 06/15/24 1836 06/16/24 0335        Medications  Scheduled Meds:  amLODipine   10 mg Oral Daily   clonazePAM   0.5 mg Oral Daily   divalproex   250 mg Oral BID   donepezil   5 mg Oral QHS   feeding supplement  237 mL Oral BID BM   heparin  injection (subcutaneous)  5,000 Units Subcutaneous Q8H   insulin  aspart  0-5 Units Subcutaneous QHS   insulin  aspart  0-9 Units Subcutaneous TID WC   insulin  glargine-yfgn  8 Units Subcutaneous Q24H   irbesartan  300 mg Oral Daily   metoprolol  tartrate  75 mg Oral BID   nystatin  5 mL Oral QID   Continuous Infusions: PRN Meds:.acetaminophen , dextrose , hydrALAZINE , labetalol     Subjective:   Kristen Bowman was seen and examined today.   No new events.   Objective:   Vitals:   06/27/24 0026 06/27/24 0437 06/27/24 0825 06/27/24 1222  BP: (!) 131/47 137/75 (!) 149/56 (!) 117/43  Pulse: 74 78  67  Resp: 20 19  (!) 22  Temp: 98.1 F (36.7 C) 98.4 F (36.9 C) 98.3 F (36.8 C) 98.8 F (37.1 C)  TempSrc: Oral Oral Oral Axillary  SpO2: 97% 95%  91%  Weight:      Height:        Intake/Output Summary (Last 24 hours) at 06/27/2024 1508 Last data filed at 06/27/2024 1225 Gross per 24 hour  Intake 360 ml  Output 1375 ml  Net -1015 ml   Filed Weights   06/15/24 1552 06/15/24 1953 06/21/24  1113  Weight: 74.8 kg 62.8 kg 69 kg     Exam General exam: Appears calm and comfortable  Respiratory system:  Clear to auscultation. Respiratory effort normal. Cardiovascular system: S1 & S2 heard, RRR.  Gastrointestinal system: Abdomen is nondistended, soft and nontender. Central nervous system: Alert and oriented.  Extremities: no edema.  Skin: stage 3 pressure injury on coccyx.     Data Reviewed:  I have personally reviewed following labs and imaging studies   CBC Lab Results  Component Value Date   WBC 5.6 06/24/2024   RBC 3.41 (L) 06/24/2024   HGB 9.6 (L) 06/24/2024   HCT 29.9 (L) 06/24/2024   MCV 87.7 06/24/2024   MCH 28.2 06/24/2024   PLT 218 06/24/2024   MCHC 32.1 06/24/2024   RDW 15.4 06/24/2024   LYMPHSABS 1.1 06/15/2024   MONOABS 1.0 06/15/2024   EOSABS 0.0 06/15/2024   BASOSABS 0.0 06/15/2024     Last metabolic panel Lab Results  Component Value Date   NA 136 06/24/2024   K 4.0 06/24/2024   CL 98 06/24/2024   CO2 26 06/24/2024   BUN 21 06/24/2024   CREATININE 0.86 06/24/2024   GLUCOSE 110 (H) 06/24/2024   GFRNONAA >60 06/24/2024   GFRAA 55 (L) 01/26/2020   CALCIUM  8.2 (L) 06/24/2024   PHOS 2.3 (L) 06/18/2024   PROT 5.7 (L) 06/15/2024   PROT 5.7 (L) 06/15/2024   ALBUMIN  2.5 (L) 06/15/2024   ALBUMIN  2.5 (L) 06/15/2024   BILITOT 2.3 (H) 06/15/2024   BILITOT 2.2 (H) 06/15/2024   ALKPHOS 65 06/15/2024   ALKPHOS 65 06/15/2024   AST 14 (L) 06/15/2024   AST 14 (L) 06/15/2024   ALT 11 06/15/2024   ALT 11 06/15/2024   ANIONGAP 12 06/24/2024    CBG (last 3)  Recent Labs    06/26/24 2111 06/27/24 0812 06/27/24 1221  GLUCAP 266* 309* 274*      Coagulation Profile: No results for input(s): INR, PROTIME in the last 168 hours.   Radiology Studies: No results found.      Elgie Butter M.D. [facility] 06/27/2024, 3:08 PM  Available via Epic secure chat 7am-7pm After 7 pm, please refer to night coverage provider listed on amion.

## 2024-06-28 DIAGNOSIS — J9601 Acute respiratory failure with hypoxia: Secondary | ICD-10-CM | POA: Diagnosis not present

## 2024-06-28 DIAGNOSIS — F0390 Unspecified dementia without behavioral disturbance: Secondary | ICD-10-CM | POA: Diagnosis not present

## 2024-06-28 DIAGNOSIS — J690 Pneumonitis due to inhalation of food and vomit: Secondary | ICD-10-CM | POA: Diagnosis not present

## 2024-06-28 DIAGNOSIS — I10 Essential (primary) hypertension: Secondary | ICD-10-CM

## 2024-06-28 DIAGNOSIS — R4182 Altered mental status, unspecified: Secondary | ICD-10-CM | POA: Diagnosis not present

## 2024-06-28 LAB — GLUCOSE, CAPILLARY
Glucose-Capillary: 208 mg/dL — ABNORMAL HIGH (ref 70–99)
Glucose-Capillary: 240 mg/dL — ABNORMAL HIGH (ref 70–99)
Glucose-Capillary: 204 mg/dL — ABNORMAL HIGH (ref 70–99)
Glucose-Capillary: 239 mg/dL — ABNORMAL HIGH (ref 70–99)

## 2024-06-28 MED ORDER — ENSURE PLUS HIGH PROTEIN PO LIQD: 237.0000 mL | Freq: Two times a day (BID) | ORAL | 2 refills | Status: AC

## 2024-06-28 NOTE — Progress Notes (Signed)
 Nutrition Follow-up  INTERVENTION:   Continue DYS 1 + Nectar Thick liquids per SLP  Ensure Plus High Protein po BID, each supplement provides 350 kcal and 20 grams of protein.  Thicken to appropriate consistently   NUTRITION DIAGNOSIS:   Inadequate oral intake related to chronic illness (dementia) as evidenced by per patient/family report - ongoing   GOAL:   Patient will meet greater than or equal to 90% of their needs - not met   MONITOR:   PO intake, Supplement acceptance  REASON FOR ASSESSMENT:   Consult Assessment of nutrition requirement/status, Enteral/tube feeding initiation and management, Poor PO  ASSESSMENT:   89 year old female on hospice care with a PMHx of dementia, CKD stage II, T2DM, HTN, anemia who presented to the ED on 06/15/2024 from [facility] for increased lethargy, hypoxia, and hyperglycemia, found to be in DKA with acute hypoxic respiratory failure and sepsis secondary to aspiration pneumonia.  10/21: admitted  Patient seen in room, sleeping. Breakfast tray at bedside, 90% consumed. Pt downgraded to DYS 1 diet by SLP on 10/29, remains on nectar thick liquids. 41% PO intake x 8 meals documented in flowsheet. Plan for patient to discharge with hospice in 24-48 hrs once family decides on an agency.    Admit weight: 62.9 kg  Current weight: 69 kg  Nutritionally Relevant Medications:  SSI 0-5 units TID, Semglee  8 units daily   Labs Reviewed: CBG 179-309  NUTRITION - FOCUSED PHYSICAL EXAM:  Flowsheet Row Most Recent Value  Orbital Region No depletion  Upper Arm Region Moderate depletion  Thoracic and Lumbar Region No depletion  Buccal Region Mild depletion  Temple Region Mild depletion  Clavicle Bone Region Mild depletion  Clavicle and Acromion Bone Region Mild depletion  Scapular Bone Region Mild depletion  Dorsal Hand No depletion  Patellar Region No depletion  Anterior Thigh Region No depletion  Posterior Calf Region Mild depletion   Hair Reviewed  Eyes Reviewed  Mouth Reviewed  Skin Reviewed  Nails Reviewed     Diet Order:   Diet Order             DIET - DYS 1 Room service appropriate? Yes; Fluid consistency: Nectar Thick  Diet effective now                   EDUCATION NEEDS:   No education needs have been identified at this time  Skin:  Skin Assessment: Skin Integrity Issues: Skin Integrity Issues:: Stage I Stage I: left heel, medial sacrum, right sacrum - DTPI to sacrum POA now beloved to stage III pressure injury per WOCN on 10/27   Last BM:  11/02, type 6/7  Height:   Ht Readings from Last 1 Encounters:  06/21/24 5' 6 (1.676 m)    Weight:   Wt Readings from Last 1 Encounters:  06/21/24 69 kg    BMI:  Body mass index is 24.55 kg/m.  Estimated Nutritional Needs:   Kcal:  1500-1700  Protein:  75-90g  Fluid:  1.5L/day  Ary Rudnick, MS, RD, LDN Clinical Dietitian  Contact via secure chat. If unavailable, use group chat RD Inpatient.

## 2024-06-28 NOTE — Discharge Summary (Signed)
 Physician Discharge Summary   Patient: Kristen Bowman MRN: [id number] DOB: December 07, 1930  Admit date:     06/15/2024  Discharge date: 06/29/24  Discharge Physician: Elgie Butter   PCP: Valentin Skates, DO   Recommendations at discharge:  Please follow up with hospice MD as needed.   Discharge Diagnoses: Principal Problem:   AMS (altered mental status) Active Problems:   Tachycardia   Primary hypertension   Dementia without behavioral disturbance (HCC)   Elevated troponin   Pressure injury of skin   DKA (diabetic ketoacidosis) (HCC)   Acute hypoxic respiratory failure (HCC)   Aspiration pneumonia (HCC)   Hypernatremia  Resolved Problems:   * No resolved hospital problems. [facility] Hospital Course: The patient is a 89 year old female on hospice care with a PMHx of dementia, CKD stage II, T2DM, HTN, anemia who presented to the ED on 06/15/2024 from [facility] for increased lethargy, hypoxia, and hyperglycemia, found to be in DKA with acute hypoxic respiratory failure and sepsis secondary to aspiration pneumonia.  No new events overnight.  Plan for discharge home with hospice tomorrow afternoon after daughter and grand daughter decides.   Assessment and Plan:  # Acute hypoxic respiratory failure - improving - Presented with SpO2 of 88% on RA with tachypnea in the 20s. Initially, likely secondary to aspiration pneumonia.  - She is still requiring supplemental O2 at this time, suspect she is somewhat volume overloaded (net +3.9L since admission) given amount of fluids she received for DKA, noted mild LE edema and bilateral crackles on exam - slightly fluid overloaded, was given 2 doses of IV lasix and 1600 ml urine out yesterday.  -she remains on oxygen, led edema improving with IV lasix.  But her repeat CXR shows worsening of the left lung and right lung base opacities, suspect ongoing aspiration despite change in diet and antibiotics.  - SLP eval done, MBS done, showing worsening of  her dysphagia due to her cognitive impairment.  - she remains on 3 to 4 lit of [place]arr oxygen.  Family is still deciding about home hosp[facility]       # Sepsis  - Presented with leukocytosis to 12.6, tachypnea 27, lactic acid elevated to 3.4 with AMS and AKI, with concern for underlying aspiration pneumonia and UTI - resolved.    # Aspiration pneumonia - Concern for aspiration pneumonia given encephalopathy with findings concerning for old emesis on oral care  - CXR (10/22) showing new confluent new retrocardiac opacity on the left compatible with new or increased left lower lung collapse or consolidation - RPR negative - Procalcitonin 0.21 on 10/22 - she remains afebrile, with WBC count wnl.  - but her repeat CXR shows worsening of the opacities, suspect continual aspiration .  - discussed with the patient's grand daughter over the phone.    #Hypertension Optimal BP parameters.  Continue with amlodipine , avapro and metoprolol  75 mg BID.    # Hypernatremia - resolved - Initially elevated Na in the setting of dehydration and osmotic diuresis from DKA then resolved. Sodium wnl.    # Dysphagia - Patient presented with encephalopathy secondary to DKA complicated by aspiration pneumonia. Her mental status has improved. However, she remains dysphagic. SLP is following and has recommended a dysphagia 1 diet with nectar thick liquids. -  Family not interested in Cortrak anymore. MBS done, and showed worsening dysphagia.    # DKA - resolved Uncontrolled DM with episodes of of hypoglycemia - Presented with hyperglycemia  to 770s in the setting of not receiving insulin  at SNF during 5-day stay. BhB elevated to > 8 on admission - BhB wnl Stopped all insulin  regimens except for SSI as patient has poor oral intake.    #Lactic acidosis - resolved - Episodes of recurrent lactic acidosis, possibly secondary to aspiration pneumonia and dehydration -  resolved.    # High  anion gap metabolic acidosis - resolved - Likely secondary to lactic acidosis and DKA - Continue management of underlying causes as above   # Possible UTI - UA concerning for UTI. Patient encephalopathic and unable to reliably communicate symptoms - History of ESBL E. Coli - Urine culture showing greater than 100,000 colonies of multiple species and suggested recollection.  However, patient receiving antibiotics with clinical improvement, so repeat UA/UCx futile at this point - Completed 7 days of meropenem   # Hypokalemia - resolved   #Acute metabolic encephalopathy  - Likely secondary to acute illness caused by aspiration pneumonia in the setting of underlying dementia - she is oriented to person only , when awake.  - discussed about getting another CT  head, but family is considering hospice.  - she is refusing to get up and eat meals most of the time. She is only taking food when family is at bedside.    #AKI on CKD stage IIIa - resolved - Creatinine elevated 2.74 on admission, baseline around 0.9 - Likely prerenal in the setting of dehydration from vomiting and osmotic diuresis - Improved with IVFs - creatinine stable around 0.8   #Elevated troponins - High-sensitivity troponins peaked at 192 - Likely secondary to acute illness, aspiration pneumonia, DKA  - Echo showed LVEF 70 to 75% with no regional wall motion normalities.  Noted severe asymmetric left ventricular hypertrophy of the basal septal segment - Cardiology evaluated - indicated unlikely ACS if no abnormalities on echo - she was started on heparin  gtt and discontinued on 10/22.  - currently chest pain free.    #Dementia #Anxiety #Depression - Home meds include sertraline  50 mg daily, Klonopin  0.5 mg daily, Depakote  125 mg twice daily, donepezil  5 mg daily, trazodone  50 mg nightly, risperidone  0.5 mg nightly. Initially held due to AMS and NPO.  Patient's grand daughter is concerned that patient remains sleepy most  of the time, we discussed about decreasing the dose of clonazepam  but family does not  want to do it as she has been on these meds for more than 10 years. She continues to refuse getting up and has minimal oral intake.  Patient continues to decline  despite completing antibiotics.        RN Pressure Injury Documentation: Wound 06/23/24 1430 Pressure Injury Coccyx Mid Stage 3 -  Full thickness tissue loss. Subcutaneous fat may be visible but bone, tendon or muscle are NOT exposed. (Active)     Wound 06/23/24 1430 Pressure Injury Heel Left Deep Tissue Pressure Injury - Purple or maroon localized area of discolored intact skin or blood-filled blister due to damage of underlying soft tissue from pressure and/or shear. (Active)  Wound care consulted and recommendations given.    Malnutrition Type:   Nutrition Problem: Inadequate oral intake Etiology: chronic illness (dementia)     Malnutrition Characteristics:   Signs/Symptoms: per patient/family report     Nutrition Interventions:   Interventions: Ensure Enlive (each supplement provides 350kcal and 20 grams of protein), Tube feeding   Estimated body mass index is 24.55 kg/m as calculated from the following:   Height as  of this encounter: 5' 6 (1.676 m).   Weight as of this encounter: 69 kg.    Consultants: palliative Procedures performed: none  Disposition: Hospice care Diet recommendation:  Dysphagia type 1 nectar thick Liquid DISCHARGE MEDICATION: Allergies as of 06/29/2024       Reactions   Ace Inhibitors Shortness Of Breath, Swelling   Tape Other (See Comments)   Skin bruises and tears easily        Medication List     STOP taking these medications    aspirin  EC 325 MG tablet   risperiDONE  0.5 MG tablet Commonly known as: RISPERDAL    sertraline  50 MG tablet Commonly known as: ZOLOFT    traZODone  50 MG tablet Commonly known as: DESYREL        TAKE these medications    amLODipine  10 MG  tablet Commonly known as: NORVASC  Take 1 tablet (10 mg total) by mouth daily.   atorvastatin  40 MG tablet Commonly known as: LIPITOR Take 40 mg by mouth daily.   clonazePAM  0.5 MG tablet Commonly known as: KLONOPIN  Take 0.5 mg by mouth daily.   divalproex  125 MG capsule Commonly known as: Depakote  Sprinkles Take 2 capsules (250 mg total) by mouth 2 (two) times daily.   donepezil  5 MG tablet Commonly known as: ARICEPT  Take 1 tablet (5 mg total) by mouth at bedtime.   feeding supplement Liqd Take 237 mLs by mouth 2 (two) times daily between meals.   melatonin 3 MG Tabs tablet Take 3 mg by mouth at bedtime.   metFORMIN  500 MG tablet Commonly known as: GLUCOPHAGE  Take 750 mg by mouth daily with breakfast.   metoprolol  tartrate 25 MG tablet Commonly known as: LOPRESSOR  Take 75 mg by mouth 2 (two) times daily.   olmesartan 40 MG tablet Commonly known as: BENICAR Take 40 mg by mouth daily.        Follow-up Information     Valentin Skates, DO Follow up.   Specialty: Internal Medicine Contact information: [address] [place] [address] [phone]         [facility] Follow up.   Why: Hospice RN-office to call with visit times. Contact information: Address: [address], [place], [place] [address] Phone: [phone]               Discharge Exam: Filed Weights   06/15/24 1552 06/15/24 1953 06/21/24 1113  Weight: 74.8 kg 62.8 kg 69 kg   General exam: Appears calm and comfortable  Respiratory system: Clear to auscultation. Respiratory effort normal. Cardiovascular system: S1 & S2 heard, RRR. No JVD,  Gastrointestinal system: Abdomen is nondistended, soft and nontender.  Central nervous system: Alert and oriented. No focal neurological deficits. Extremities: Symmetric 5 x 5 power. Skin: No rashes, Psychiatry: Mood & affect appropriate.    Condition at discharge: fair  The results of significant diagnostics from this hospitalization  (including imaging, microbiology, ancillary and laboratory) are listed below for reference.   Imaging Studies: DG Swallowing Func-Speech Pathology Result Date: 06/24/2024 Table formatting from the original result was not included. Modified Barium Swallow Study Patient Details Name: Kristen Bowman MRN: [id number] Date of Birth: 1931-07-10 Today's Date: 06/24/2024 HPI/PMH: HPI: Patient is a 89 y.o. female with past medical history of diabetes mellitus type 2, hypertension, history of E. Coli UTI and associated encephalopahty, right bundle branch block, chronic anemia, chronic kidney disease stage II, anemia, dementia, depression independent and oriented at baseline come from AMS and hyperglycemia. Patient was receiving respite care at a SNF  and was admitted to the hospital on 10/21 secondary to AMS hyperglycemia (daughter suspects patient was not being given her insulin ) and altered mental status and UA was positive for UTI. Also with PNA, suspected aspiration. Per RN note from 10/22, during oral care, suspected emesis in oral cavity and around dentures. Bedside swallow evaluation ordered to assess patient's swallow. Patient placed on dys 2 nectar thick diet. SLP signed off. New swallow evaluation ordered on 06/23/24 as family is requesting a change to puree diet. Clinical Impression: Clinical Impression: MBS complete. Unfortunately, patient's swallowing function is worse than it appears at bedside and possibly has declined even since initially evaluated by SLP. Oral and pharyngeal strength largely WFL with only mild base of tongue weakness resulting in trace-minimal vallecular residuals post swallow. Primary deficit is combination of age and cognitively impacted.  Timing of swallow is inconsistent, at times with swallow triggered at the vallecula with resultant intact airway protection, even with thin liquids. In at least 50% of opportunities however, swallow is triggered either once the bolus has reached the vocal  cords or at the pyriform sinuses and it is during these times that the bolus is either penetrated to the level fo the vocal cords or aspirated, sensing aspiration in less than 25% of episodes with often ineffective cough response to clear. Attempted to faciliate chin tuck using straw sips of liquids however this only worsens airway protection. Additionally, vallecular residuals, while minimal, quickly spill to the pyriform sinuses post swallow without awareness or spontaneous dry swallow and are intermittently aspirated silently post swallow. Cognitive function does not allow for consistent carry out of throat clearing or coughing which otherwise would likely be functional to clear aspirates. Nectar thick liquids aspirated in slightly few instances than thin liquids and honey thick liquids not aspirated at all during today's study, although would not, in this SLPs opinion be a viable option in significantly decreasing aspiration risk and may only increase risk of development of an aspiration related infection and increase risk of dehydration. Attempted to contact patient's daugther via phone to discuss without answer. Given that focus is on comfort and that plans are for home with Hospice services after d/c, will keep diet as is for now until discussion can be had with daughter. Ultimately, family may wish to consider liberalizing diet to dysphagia 1 (puree) or even regular (if patient alert and not pocketing) and thin liquids for pleasure and decreased risk of dehydration. Factors that may increase risk of adverse event in presence of aspiration Noe & Lianne 2021): Factors that may increase risk of adverse event in presence of aspiration Noe & Lianne 2021): Respiratory or GI disease; Reduced cognitive function; Limited mobility; Frail or deconditioned Recommendations/Plan: Swallowing Evaluation Recommendations Swallowing Evaluation Recommendations Recommendations: PO diet PO Diet Recommendation: Dysphagia  1 (Pureed); Thin liquids (Level 0) (with known risk of aspiration, need to discuss with daughter first) Liquid Administration via: Cup; Straw Medication Administration: Crushed with puree Supervision: Staff to assist with self-feeding; Full supervision/cueing for swallowing strategies Swallowing strategies  : Slow rate; Small bites/sips; Clear throat intermittently; Hard cough after swallowing Postural changes: Position pt fully upright for meals Oral care recommendations: Oral care BID (2x/day) Treatment Plan Treatment Plan Treatment recommendations: Therapy as outlined in treatment plan below Follow-up recommendations: No SLP follow up Functional status assessment: Patient has had a recent decline in their functional status and demonstrates the ability to make significant improvements in function in a reasonable and predictable amount of time. Recommendations Recommendations for follow  up therapy are one component of a multi-disciplinary discharge planning process, led by the attending physician.  Recommendations may be updated based on patient status, additional functional criteria and insurance authorization. Assessment: Orofacial Exam: Orofacial Exam Oral Cavity: Oral Hygiene: WFL Oral Cavity - Dentition: Dentures, not available Orofacial Anatomy: WFL Oral Motor/Sensory Function: WFL Anatomy: Anatomy: WFL Boluses Administered: Boluses Administered Boluses Administered: Thin liquids (Level 0); Mildly thick liquids (Level 2, nectar thick); Moderately thick liquids (Level 3, honey thick); Puree; Solid  Oral Impairment Domain: Oral Impairment Domain Lip Closure: No labial escape Tongue control during bolus hold: Cohesive bolus between tongue to palatal seal Bolus preparation/mastication: Timely and efficient chewing and mashing (with puree) Bolus transport/lingual motion: Brisk tongue motion Oral residue: Trace residue lining oral structures Location of oral residue : Tongue; Palate Initiation of pharyngeal swallow  : Valleculae; Pyriform sinuses (inconsistent)  Pharyngeal Impairment Domain: Pharyngeal Impairment Domain Soft palate elevation: No bolus between soft palate (SP)/pharyngeal wall (PW) Laryngeal elevation: Complete superior movement of thyroid  cartilage with complete approximation of arytenoids to epiglottic petiole Anterior hyoid excursion: Complete anterior movement Epiglottic movement: Complete inversion Laryngeal vestibule closure: Complete, no air/contrast in laryngeal vestibule Pharyngeal stripping wave : Present - complete Pharyngeal contraction (A/P view only): N/A Pharyngoesophageal segment opening: Complete distension and complete duration, no obstruction of flow Tongue base retraction: Trace column of contrast or air between tongue base and PPW Pharyngeal residue: Collection of residue within or on pharyngeal structures Location of pharyngeal residue: Valleculae  Esophageal Impairment Domain: Esophageal Impairment Domain Esophageal clearance upright position: Complete clearance, esophageal coating Pill: Pill Consistency administered: -- (NT) Penetration/Aspiration Scale Score: Penetration/Aspiration Scale Score 1.  Material does not enter airway: Puree; Solid; Moderately thick liquids (Level 3, honey thick) 5.  Material enters airway, CONTACTS cords and not ejected out: Thin liquids (Level 0); Mildly thick liquids (Level 2, nectar thick) 7.  Material enters airway, passes BELOW cords and not ejected out despite cough attempt by patient: Thin liquids (Level 0) (with max cues) 8.  Material enters airway, passes BELOW cords without attempt by patient to eject out (silent aspiration) : Thin liquids (Level 0); Mildly thick liquids (Level 2, nectar thick) Compensatory Strategies: Compensatory Strategies Compensatory strategies: Yes Straw: Ineffective Ineffective Straw: Thin liquid (Level 0) Chin tuck: Ineffective Ineffective Chin Tuck: Thin liquid (Level 0)   General Information: Caregiver present: No  Diet  Prior to this Study: Dysphagia 2 (finely chopped); Mildly thick liquids (Level 2, nectar thick)   Temperature : Febrile   Respiratory Status: WFL   Supplemental O2: Nasal cannula   History of Recent Intubation: No  Behavior/Cognition: Alert; Cooperative; Pleasant mood; Requires cueing; Distractible Self-Feeding Abilities: Able to self-feed; Needs set-up for self-feeding Baseline vocal quality/speech: Normal Volitional Cough: Able to elicit (inconsistent due to mentation) Volitional Swallow: Able to elicit No data recorded Goal Planning: Prognosis for improved oropharyngeal function: Guarded Barriers to Reach Goals: Cognitive deficits; Overall medical prognosis No data recorded Patient/Family Stated Goal: None stated No data recorded Pain: Pain Assessment Pain Assessment: Faces Faces Pain Scale: 0 Breathing: 0 Negative Vocalization: 0 Facial Expression: 0 Body Language: 0 Consolability: 0 PAINAD Score: 0 Facial Expression: 0 Body Movements: 0 Muscle Tension: 0 Compliance with ventilator (intubated pts.): N/A Vocalization (extubated pts.): N/A CPOT Total: 0 End of Session: Start Time:SLP Start Time (ACUTE ONLY): 0940 Stop Time: SLP Stop Time (ACUTE ONLY): 1000 Time Calculation:SLP Time Calculation (min) (ACUTE ONLY): 20 min Charges: SLP Evaluations $ SLP Speech Visit: 1 Visit SLP Evaluations $BSS  Swallow: 1 Procedure $MBS Swallow: 1 Procedure SLP visit diagnosis: SLP Visit Diagnosis: Dysphagia, oropharyngeal phase (R13.12) Past Medical History: Past Medical History: Diagnosis Date  Anemia   Anxiety states   Diabetes mellitus   T2DM with renal manifestations  Generalized osteoarthrosis, unspecified site   Hematuria   Hyperlipidemia   mixed  Hypertension   Irritable bowel syndrome   Other specified cardiac dysrhythmias(427.89)   atrial fibrilation  Panic disorder   Polymyalgia rheumatica   Proteinuria 05/29/10  Type II or unspecified type diabetes mellitus with renal manifestations, uncontrolled(250.42) 05/29/10  CKD II  (mild) Past Surgical History: Past Surgical History: Procedure Laterality Date  CATARACT EXTRACTION    right  COLONOSCOPY N/A 11/04/2012  Procedure: COLONOSCOPY;  Surgeon: Elsie Cree, MD;  Location: [facility];  Service: Endoscopy;  Laterality: N/A;  ESOPHAGOGASTRODUODENOSCOPY N/A 11/04/2012  Procedure: ESOPHAGOGASTRODUODENOSCOPY (EGD);  Surgeon: Elsie Cree, MD;  Location: [facility];  Service: Endoscopy;  Laterality: N/A;  TUBAL LIGATION   Leah McCoy MA, CCC-SLP McCoy Leah Meryl 06/24/2024, 10:40 AM  DG CHEST PORT 1 VIEW Result Date: 06/23/2024 CLINICAL DATA:  Follow-up left lower lobe infection. EXAM: PORTABLE CHEST 1 VIEW COMPARISON:  Radiograph 06/20/2024, remote chest CT reviewed. FINDINGS: Worsening left lung base opacity with developing pleural effusion. Stable cardiomegaly. Unchanged mediastinal contours with aortic atherosclerosis. Mild patchy opacity at the right lung base. No pneumothorax. IMPRESSION: 1. Worsening left lung base opacity with developing pleural effusion. 2. Mild patchy opacity at the right lung base, atelectasis versus pneumonia. Electronically Signed   By: Andrea Gasman M.D.   On: 06/23/2024 15:01   DG CHEST PORT 1 VIEW Result Date: 06/20/2024 EXAM: 1 VIEW(S) XRAY OF THE CHEST 06/20/2024 06:12:00 PM COMPARISON: Chest x-ray 06/16/2024. CLINICAL HISTORY: 89973 Shortness of breath 10026. SOB Shortness of breath 10026. SOB FINDINGS: LUNGS AND PLEURA: There are reticular and patchy opacities in the left lower lung, likely infection. This has mildly increased. No pulmonary edema. No pleural effusion. No pneumothorax. HEART AND MEDIASTINUM: There are atherosclerotic calcifications of the aorta. The heart is mildly enlarged. BONES AND SOFT TISSUES: No acute osseous abnormality. IMPRESSION: 1. Mildly increased reticular and patchy opacities in the left lower lung, likely infection. 2. Mildly enlarged heart. Electronically signed by: Greig Pique MD 06/20/2024 07:38 PM EDT RP  Workstation: [id number]   ECHOCARDIOGRAM COMPLETE Result Date: 06/16/2024    ECHOCARDIOGRAM REPORT   Patient Name:   Kristen Bowman Date of Exam: 06/16/2024 Medical Rec #:  [id number]     Height:       66.0 in Accession #:    [id number]    Weight:       138.4 lb Date of Birth:  04/02/1931     BSA:          1.710 m Patient Age:    89 years      BP:           114/40 mmHg Patient Gender: F             HR:           110 bpm. Exam Location:  Inpatient Procedure: 2D Echo, Cardiac Doppler and Color Doppler (Both Spectral and Color            Flow Doppler were utilized during procedure). Indications:    NSTEMI I21.4  History:        Patient has no prior history of Echocardiogram examinations.  Risk Factors:Hypertension and Diabetes.  Sonographer:    Jayson Gaskins Referring Phys: CAROLE N HALL IMPRESSIONS  1. Left ventricular ejection fraction, by estimation, is 70 to 75%. The left ventricle has hyperdynamic function. The left ventricle has no regional wall motion abnormalities. There is severe asymmetric left ventricular hypertrophy of the basal-septal segment. Left ventricular diastolic parameters were normal.  2. Right ventricular systolic function is normal. The right ventricular size is normal.  3. The mitral valve is degenerative. Trivial mitral valve regurgitation. Mild mitral stenosis. The mean mitral valve gradient is 6.0 mmHg with average heart rate of 112 bpm. Severe mitral annular calcification.  4. The aortic valve was not well visualized. There is moderate calcification of the aortic valve. There is moderate thickening of the aortic valve. Aortic valve regurgitation is not visualized. Aortic valve sclerosis is present, with no evidence of aortic valve stenosis.  5. The inferior vena cava is normal in size with greater than 50% respiratory variability, suggesting right atrial pressure of 3 mmHg. Comparison(s): Prior images unable to be directly viewed, comparison made by report only.  Conclusion(s)/Recommendation(s): Otherwise normal echocardiogram, with minor abnormalities described in the report. FINDINGS  Left Ventricle: Left ventricular ejection fraction, by estimation, is 70 to 75%. The left ventricle has hyperdynamic function. The left ventricle has no regional wall motion abnormalities. The left ventricular internal cavity size was normal in size. There is severe asymmetric left ventricular hypertrophy of the basal-septal segment. Left ventricular diastolic parameters were normal. Right Ventricle: The right ventricular size is normal. No increase in right ventricular wall thickness. Right ventricular systolic function is normal. Left Atrium: Left atrial size was normal in size. Right Atrium: Right atrial size was normal in size. Pericardium: Trivial pericardial effusion is present. Mitral Valve: The mitral valve is degenerative in appearance. There is moderate thickening of the mitral valve leaflet(s). There is moderate calcification of the mitral valve leaflet(s). Severe mitral annular calcification. Trivial mitral valve regurgitation. Mild mitral valve stenosis. MV peak gradient, 22.4 mmHg. The mean mitral valve gradient is 6.0 mmHg with average heart rate of 112 bpm. Tricuspid Valve: The tricuspid valve is grossly normal. Tricuspid valve regurgitation is mild. Aortic Valve: The aortic valve was not well visualized. There is moderate calcification of the aortic valve. There is moderate thickening of the aortic valve. Aortic valve regurgitation is not visualized. Aortic valve sclerosis is present, with no evidence of aortic valve stenosis. Aortic valve mean gradient measures 9.0 mmHg. Aortic valve peak gradient measures 13.7 mmHg. Aortic valve area, by VTI measures 2.00 cm. Pulmonic Valve: The pulmonic valve was not well visualized. Pulmonic valve regurgitation is trivial. Aorta: The aortic root is normal in size and structure. Venous: The inferior vena cava is normal in size with  greater than 50% respiratory variability, suggesting right atrial pressure of 3 mmHg. IAS/Shunts: No atrial level shunt detected by color flow Doppler.  LEFT VENTRICLE PLAX 2D LVIDd:         3.80 cm   Diastology LVIDs:         2.50 cm   LV e' medial:    7.83 cm/s LV PW:         1.00 cm   LV E/e' medial:  16.5 LV IVS:        1.99 cm   LV e' lateral:   13.10 cm/s LVOT diam:     1.80 cm   LV E/e' lateral: 9.8 LV SV:         66 LV SV  Index:   39 LVOT Area:     2.54 cm  RIGHT VENTRICLE RV S prime:     18.20 cm/s TAPSE (M-mode): 2.5 cm LEFT ATRIUM             Index LA Vol (A2C):   24.9 ml 14.56 ml/m LA Vol (A4C):   45.5 ml 26.60 ml/m LA Biplane Vol: 37.1 ml 21.69 ml/m  AORTIC VALVE AV Area (Vmax):    2.16 cm AV Area (Vmean):   2.12 cm AV Area (VTI):     2.00 cm AV Vmax:           185.00 cm/s AV Vmean:          149.000 cm/s AV VTI:            0.332 m AV Peak Grad:      13.7 mmHg AV Mean Grad:      9.0 mmHg LVOT Vmax:         157.00 cm/s LVOT Vmean:        124.000 cm/s LVOT VTI:          0.261 m LVOT/AV VTI ratio: 0.79  AORTA Ao Root diam: 2.90 cm MITRAL VALVE MV Area (PHT): 5.54 cm     SHUNTS MV Area VTI:   1.62 cm     Systemic VTI:  0.26 m MV Peak grad:  22.4 mmHg    Systemic Diam: 1.80 cm MV Mean grad:  6.0 mmHg MV Vmax:       2.37 m/s MV Vmean:      149.5 cm/s MV Decel Time: 137 msec MV E velocity: 129.00 cm/s MV A velocity: 219.00 cm/s MV E/A ratio:  0.59 Shelda Bruckner MD Electronically signed by Shelda Bruckner MD Signature Date/Time: 06/16/2024/4:29:52 PM    Final    DG CHEST PORT 1 VIEW Result Date: 06/16/2024 EXAM: 1 VIEW(S) XRAY OF THE CHEST 06/16/2024 10:56:00 AM COMPARISON: Portable chest X-ray from 06/15/2024 and prior studies. CLINICAL HISTORY: 89 year old female with hypoxia. FINDINGS: LUNGS AND PLEURA: Confluent new retrocardiac opacity on the left compatible with new or increased left lower lung collapse or consolidation. Trace new pleural fluid or thickening along the right minor  fissure. Pneumothorax. No acute pulmonary edema. No other confluent lung opacity. HEART AND MEDIASTINUM: Advanced calcified aortic atherosclerosis. Stable cardiomegaly. BONES AND SOFT TISSUES: No acute osseous abnormality. IMPRESSION: 1. Confluent new retrocardiac opacity on the left compatible with partial lower lobe collapse or consolidation. 2. Trace new pleural fluid or thickening along the right minor fissure. No acute edema. 3. Stable cardiomegaly.  Advanced calcified aortic atherosclerosis. Electronically signed by: Helayne Hurst MD 06/16/2024 11:22 AM EDT RP Workstation: [id number]   DG Chest Portable 1 View Result Date: 06/15/2024 EXAM: 1 VIEW(S) XRAY OF THE CHEST 06/15/2024 02:33:00 PM COMPARISON: 03/29/2024 CLINICAL HISTORY: ALOC. Triage notes: Pt bib [facility] coming from [facility]. EMS called out for hyperglycemia and lethargy. Pt has been at facility since Friday. Per EMS, facility was unaware that patient is diabetic and pt has not had her blood sugar checked or received ; insulin . Pt lethargic during triage. Patient normally alert and talking at baseline, unsure if patient is normally oriented. Patient SpO2 88% with EMS on RA. High glucose read with EMS (above 600).; ; EMS VS:; 120/40; 80 HR; 90% 2L; 20G LAC; 500 CC NS FINDINGS: LUNGS AND PLEURA: Improved lung volumes. Central pulmonary vascular congestion without overt pulmonary edema. No focal pulmonary opacity. No pleural effusion. No pneumothorax. HEART AND MEDIASTINUM:  Borderline cardiomegaly. Calcified aortic atherosclerosis. BONES AND SOFT TISSUES: No acute osseous abnormality. IMPRESSION: 1. Central pulmonary vascular congestion without overt pulmonary edema. 2. Similar borderline cardiomegaly. Electronically signed by: Donnice Mania MD 06/15/2024 03:10 PM EDT RP Workstation: [id number]    Microbiology: Results for orders placed or performed during the hospital encounter of 06/15/24  Blood culture (routine x 2)     Status: None    Collection Time: 06/15/24  8:45 PM   Specimen: BLOOD RIGHT ARM  Result Value Ref Range Status   Specimen Description BLOOD RIGHT ARM  Final   Special Requests   Final    BOTTLES DRAWN AEROBIC AND ANAEROBIC Blood Culture results may not be optimal due to an inadequate volume of blood received in culture bottles   Culture   Final    NO GROWTH 5 DAYS Performed at [facility], [address] [place], [place] [address]    Report Status 06/20/2024 FINAL  Final  Blood culture (routine x 2)     Status: None   Collection Time: 06/15/24  8:46 PM   Specimen: BLOOD RIGHT HAND  Result Value Ref Range Status   Specimen Description BLOOD RIGHT HAND  Final   Special Requests   Final    BOTTLES DRAWN AEROBIC AND ANAEROBIC Blood Culture results may not be optimal due to an inadequate volume of blood received in culture bottles   Culture   Final    NO GROWTH 5 DAYS Performed at [facility], [address] [place], [place] [address]    Report Status 06/20/2024 FINAL  Final  Urine Culture (for pregnant, neutropenic or urologic patients or patients with an indwelling urinary catheter)     Status: Abnormal   Collection Time: 06/16/24  5:20 AM   Specimen: In/Out Cath Urine  Result Value Ref Range Status   Specimen Description IN/OUT CATH URINE  Final   Special Requests   Final    NONE Performed at [facility], [address], [place], [place] [address]    Culture (A)  Final    >=100,000 COLONIES/mL MULTIPLE SPECIES PRESENT, SUGGEST RECOLLECTION   Report Status 06/17/2024 FINAL  Final  MRSA culture     Status: None   Collection Time: 06/16/24 10:30 AM   Specimen: Nasal; Body Fluid  Result Value Ref Range Status   Specimen Description NASAL SWAB  Final   Special Requests NONE  Final   Culture   Final    NO STAPHYLOCOCCUS AUREUS ISOLATED Performed at [facility], [address], [place], [place] [address]    Report Status 06/20/2024 FINAL  Final  MRSA Next Gen by PCR,  Nasal     Status: None   Collection Time: 06/16/24 10:30 AM   Specimen: Nasal Mucosa; Nasal Swab  Result Value Ref Range Status   MRSA by PCR Next Gen NOT DETECTED NOT DETECTED Final    Comment: (NOTE) The GeneXpert MRSA Assay (FDA approved for NASAL specimens only), is one component of a comprehensive MRSA colonization surveillance program. It is not intended to diagnose MRSA infection nor to guide or monitor treatment for MRSA infections. Test performance is not FDA approved in patients less than 45 years old. Performed at [facility], [address] [place], [place] [address]   Respiratory (~20 pathogens) panel by PCR     Status: None   Collection Time: 06/16/24  5:25 PM   Specimen: Nasopharyngeal Swab; Respiratory  Result Value Ref Range  Status   Adenovirus NOT DETECTED NOT DETECTED Final   Coronavirus 229E NOT DETECTED NOT DETECTED Final    Comment: (NOTE) The Coronavirus on the Respiratory Panel, DOES NOT test for the novel  Coronavirus (2019 nCoV)    Coronavirus HKU1 NOT DETECTED NOT DETECTED Final   Coronavirus NL63 NOT DETECTED NOT DETECTED Final   Coronavirus OC43 NOT DETECTED NOT DETECTED Final   Metapneumovirus NOT DETECTED NOT DETECTED Final   Rhinovirus / Enterovirus NOT DETECTED NOT DETECTED Final   Influenza A NOT DETECTED NOT DETECTED Final   Influenza B NOT DETECTED NOT DETECTED Final   Parainfluenza Virus 1 NOT DETECTED NOT DETECTED Final   Parainfluenza Virus 2 NOT DETECTED NOT DETECTED Final   Parainfluenza Virus 3 NOT DETECTED NOT DETECTED Final   Parainfluenza Virus 4 NOT DETECTED NOT DETECTED Final   Respiratory Syncytial Virus NOT DETECTED NOT DETECTED Final   Bordetella pertussis NOT DETECTED NOT DETECTED Final   Bordetella Parapertussis NOT DETECTED NOT DETECTED Final   Chlamydophila pneumoniae NOT DETECTED NOT DETECTED Final   Mycoplasma pneumoniae NOT DETECTED NOT DETECTED Final    Comment: Performed at [facility], [address] [place], [place] [address]  Resp panel by RT-PCR (RSV, Flu A&B, Covid) Anterior Nasal Swab     Status: None   Collection Time: 06/17/24  9:15 AM   Specimen: Anterior Nasal Swab  Result Value Ref Range Status   SARS Coronavirus 2 by RT PCR NEGATIVE NEGATIVE Final   Influenza A by PCR NEGATIVE NEGATIVE Final   Influenza B by PCR NEGATIVE NEGATIVE Final    Comment: (NOTE) The Xpert Xpress SARS-CoV-2/FLU/RSV plus assay is intended as an aid in the diagnosis of influenza from Nasopharyngeal swab specimens and should not be used as a sole basis for treatment. Nasal washings and aspirates are unacceptable for Xpert Xpress SARS-CoV-2/FLU/RSV testing.  Fact Sheet for Patients: bloggercourse.com  Fact Sheet for Healthcare Providers: seriousbroker.it  This test is not yet approved or cleared by the [place]  FDA and has been authorized for detection and/or diagnosis of SARS-CoV-2 by FDA under an Emergency Use Authorization (EUA). This EUA will remain in effect (meaning this test can be used) for the duration of the COVID-19 declaration under Section 564(b)(1) of the Act, 21 U.S.C. section 360bbb-3(b)(1), unless the authorization is terminated or revoked.     Resp Syncytial Virus by PCR NEGATIVE NEGATIVE Final    Comment: (NOTE) Fact Sheet for Patients: bloggercourse.com  Fact Sheet for Healthcare Providers: seriousbroker.it  This test is not yet approved or cleared by the [place]  FDA and has been authorized for detection and/or diagnosis of SARS-CoV-2 by FDA under an Emergency Use Authorization (EUA). This EUA will remain in effect (meaning this test can be used) for the duration of the COVID-19 declaration under Section 564(b)(1) of the Act, 21 U.S.C. section 360bbb-3(b)(1), unless the authorization is terminated or revoked.  Performed at [facility], [address], [place], [place] [address]     Labs: CBC: Recent Labs  Lab 06/23/24 0406 06/24/24 0143  WBC 4.3 5.6  HGB 9.7* 9.6*  HCT 31.3* 29.9*  MCV 90.2 87.7  PLT 207 218   Basic Metabolic Panel: Recent Labs  Lab 06/23/24 0406 06/24/24 1915  NA 137 136  K 4.2 4.0  CL 105 98  CO2 24 26  GLUCOSE 347* 110*  BUN 27* 21  CREATININE 0.81 0.86  CALCIUM  8.1* 8.2*   Liver Function Tests: No results  for input(s): AST, ALT, ALKPHOS, BILITOT, PROT, ALBUMIN  in the last 168 hours. CBG: Recent Labs  Lab 06/28/24 0818 06/28/24 1206 06/28/24 1604 06/28/24 2135 06/29/24 0832  GLUCAP 208* 240* 204* 239* 197*    Discharge time spent: 34 minutes  Signed: Elgie Butter, MD [facility] 06/29/2024

## 2024-06-28 NOTE — Care Management Important Message (Signed)
 Important Message  Patient Details  Name: Kristen Bowman MRN: [id number] Date of Birth: June 03, 1931   Important Message Given:  Yes - Medicare IM     [name] 06/28/2024, 11:44 AM

## 2024-06-28 NOTE — Care Management Important Message (Signed)
 Important Message  Patient Details  Name: Kristen Bowman MRN: [id number] Date of Birth: [birth year]   Important Message Given:  Yes - Medicare IM     [name] 06/28/2024, 11:52 AM

## 2024-06-28 NOTE — Progress Notes (Signed)
 Physical Therapy Treatment Patient Details Name: Kristen Bowman MRN: [id number] DOB: 06/03/1931 Today's Date: 06/28/2024   History of Present Illness The pt is a 89yo female presenting 06/15/24 with AMS and lethargy. Pt + for DKA, AKI, possible UTI and acute hypoxic respiratory failure likely from aspiration.  PMH includes: dementia, anemia, DM II, osteoarthritis, HLD, CKD II HTN, IBS, and afib.    PT Comments  The pt remains at high risk for falls, needing maxA for bed mobility and max-total assist for transfers. She was unable to come to a full stand with maxA this date and needed total assist to squat pivot along EOB. She tends to sit with a posterior lean and intermittently leaning to the R to try to return herself to supine. Depending on the extent of her directional lean she ranges from needing CGA-maxA to sit statically. Will continue to follow acutely. If pt discharges home with hospice then recommend the following DME below and that pt remain on a Q2 rolling schedule and float heels while in bed to prevent pressure ulcers. Redness is already noted on her sacrum.     If plan is discharge home, recommend the following: Assistance with cooking/housework;Assist for transportation;Two people to help with walking and/or transfers;Two people to help with bathing/dressing/bathroom;Direct supervision/assist for financial management;Direct supervision/assist for medications management;Help with stairs or ramp for entrance;Supervision due to cognitive status   Can travel by private vehicle     No  Equipment Recommendations  Wheelchair cushion (measurements PT);Wheelchair (measurements PT);Hoyer lift;Hospital bed    Recommendations for Other Services       Precautions / Restrictions Precautions Precautions: Fall Recall of Precautions/Restrictions: Impaired Restrictions Weight Bearing Restrictions Per Provider Order: No     Mobility  Bed Mobility Overal bed mobility: Needs Assistance Bed  Mobility: Supine to Sit, Sit to Supine, Rolling Rolling: Max assist   Supine to sit: Max assist Sit to supine: Max assist   General bed mobility comments: Max multi-modal cues and extra time provided to sequence moving legs out of bed and ascending trunk. Pt with poor initiation of legs but did pull up on therapist when cued to sit up, needing maxA to complete. Pt would grab therapist's hand when cued to roll, needing maxA to roll bil for pericare. Pt initiated lean to R for trunk back to bed, needing maxA to lift legs and direct trunk fully to supine    Transfers Overall transfer level: Needs assistance Equipment used: 1 person hand held assist Transfers: Sit to/from Stand, Bed to chair/wheelchair/BSC Sit to Stand: Max assist     Squat pivot transfers: Total assist     General transfer comment: Bil knees blocked and cues provided for pt to hold onto therapist anterior to her and bring her chest to therapist's shoulder to facilitate anterior weight shift. MaxA needed to power up to a half stand 2x. Total assist to squat pivot to R along EOB 1x.    Ambulation/Gait               General Gait Details: unable to fully stand to ambulate this date   Stairs             Wheelchair Mobility     Tilt Bed    Modified Rankin (Stroke Patients Only)       Balance Overall balance assessment: Needs assistance Sitting-balance support: Bilateral upper extremity supported, Feet supported Sitting balance-Leahy Scale: Poor Sitting balance - Comments: mod-maxA initially for static sitting balance, eventually progressing to min-modA  with brief moments of CGA when pt would not lean posteriorly or laterally to the R Postural control: Posterior lean, Right lateral lean Standing balance support: Bilateral upper extremity supported, During functional activity Standing balance-Leahy Scale: Zero Standing balance comment: unable to come to full stand with maxA                             Communication Communication Communication: Impaired Factors Affecting Communication: Difficulty expressing self;Reduced clarity of speech (garbled speech at times, RN reports pt has been like that at least all morning since RN has known pt)  Cognition Arousal: Alert Behavior During Therapy: WFL for tasks assessed/performed   PT - Cognitive impairments: No family/caregiver present to determine baseline, Initiation, Problem solving, Safety/Judgement, History of cognitive impairments, Awareness, Sequencing                       PT - Cognition Comments: Pt needed max multi-modal cues to initiate all mobility. Pt with poor awareness of her directional lean and poor problem-solving to fix it to maintain her safety Following commands: Impaired Following commands impaired: Follows one step commands inconsistently    Cueing Cueing Techniques: Verbal cues, Gestural cues, Tactile cues  Exercises      General Comments        Pertinent Vitals/Pain Pain Assessment Pain Assessment: Faces Faces Pain Scale: Hurts little more Pain Location: sacral area; generalized with mobility Pain Descriptors / Indicators: Grimacing, Discomfort Pain Intervention(s): Monitored during session, Limited activity within patient's tolerance, Repositioned    Home Living                          Prior Function            PT Goals (current goals can now be found in the care plan section) Acute Rehab PT Goals Patient Stated Goal: none stated Time For Goal Achievement: 07/02/24 Potential to Achieve Goals: Fair Progress towards PT goals: Progressing toward goals    Frequency    Min 1X/week      PT Plan      Co-evaluation              AM-PAC PT 6 Clicks Mobility   Outcome Measure  Help needed turning from your back to your side while in a flat bed without using bedrails?: A Lot Help needed moving from lying on your back to sitting on the side of a flat bed  without using bedrails?: A Lot Help needed moving to and from a bed to a chair (including a wheelchair)?: Total Help needed standing up from a chair using your arms (e.g., wheelchair or bedside chair)?: Total Help needed to walk in hospital room?: Total Help needed climbing 3-5 steps with a railing? : Total 6 Click Score: 8    End of Session Equipment Utilized During Treatment: Oxygen Activity Tolerance: Patient limited by fatigue Patient left: in bed;with call bell/phone within reach;with nursing/sitter in room (with RN aware to set bed alarm) Nurse Communication: Mobility status;Other (comment) (to set bed alarm) PT Visit Diagnosis: Other abnormalities of gait and mobility (R26.89);Muscle weakness (generalized) (M62.81);Unsteadiness on feet (R26.81);Difficulty in walking, not elsewhere classified (R26.2)     Time: 8684-8663 PT Time Calculation (min) (ACUTE ONLY): 21 min  Charges:    $Therapeutic Activity: 8-22 mins PT General Charges $$ ACUTE PT VISIT: 1 Visit  Theo Ferretti, PT, DPT Acute Rehabilitation Services  Office: [phone]    Theo CHRISTELLA Ferretti 06/28/2024, 1:51 PM

## 2024-06-28 NOTE — TOC Progression Note (Addendum)
 Transition of Care [facility]) - Progression Note    Patient Details  Name: Kristen Bowman MRN: [id number] Date of Birth: February 14, 1931  Transition of Care [facility]) CM/SW Contact  Graves-Bigelow, Erminio Deems, RN Phone Number: 06/28/2024, 12:33 PM  Clinical Narrative:  ICM received a consult from the Palliative Care NP to call granddaughter [name] between 12:00-1:00 pm today regarding choice between [facility] and [facility]. ICM did speak with granddaughter and she states the plan will be for Hospice Services at home; however, they have not chosen an agency. ICM did make MD Akula aware of conversation and ICM has asked for an EDD. Granddaughter states the patient has all DME in the home that is needed. ICM will continue to follow for additional disposition needs.   1250 06-28-24  ICM did call the granddaughter [name] and [name] has stated that she has both numbers to [facility] and and [facility] and that she can call for services once they decide. ICM did explain that the hospital usually arranges Hospice from the Hospital before transition home and the daughter declined. ICM did state this may delay services having to get a provider to sign off on.   1302 06-28-24 ICM received a call from the doctor and asked if ICM can call daughter. Daughter [name] Dragon states she thinks she needs to go with [facility] because she does not want a delay in services. ICM did make the referral with [facility] to see if they can service the patient. Daughter is asking if the patient can be seen via Hospice this evening vs tomorrow am. Inpatient Case Manager will await to see if [facility] can service.   1317 06-28-24 [facility] has reviewed the patient and the liaison will follow up with the daughter regarding initial visit in the home. [facility] unable to schedule a visit this evening; however, has availability tomorrow. Patient will need ambulance transport home.   Expected  Discharge Plan: Home w Hospice Care Barriers to Discharge: Continued Medical Work up    Expected Discharge Plan and Services In-house Referral: NA Discharge Planning Services: CM Consult Post Acute Care Choice: Hospice, Resumption of Svcs/PTA Provider Living arrangements for the past 2 months: Single Family Home   Social Drivers of Health (SDOH) Interventions SDOH Screenings   Food Insecurity: No Food Insecurity (06/16/2024)  Housing: Low Risk  (06/16/2024)  Transportation Needs: No Transportation Needs (06/16/2024)  Utilities: Not At Risk (06/16/2024)  Social Connections: Socially Isolated (06/16/2024)  Tobacco Use: Low Risk  (06/16/2024)    Readmission Risk Interventions     No data to display

## 2024-06-28 NOTE — Progress Notes (Signed)
 [facility]                                                                               Kristen Bowman, is a 89 y.o. female, DOB - 1930-10-20, FMW:[id number] Admit date - 06/15/2024    Outpatient Primary MD for the patient is Valentin Skates, DO  LOS - 13  days    Brief summary    The patient is a 89 year old female on hospice care with a PMHx of dementia, CKD stage II, T2DM, HTN, anemia who presented to the ED on 06/15/2024 from [facility] for increased lethargy, hypoxia, and hyperglycemia, found to be in DKA with acute hypoxic respiratory failure and sepsis secondary to aspiration pneumonia.  No new events overnight.  Plan for discharge home with hospice tomorrow afternoon after daughter and grand daughter decides.  No new events.   Assessment & Plan    Assessment and Plan:    # Acute hypoxic respiratory failure - improving - Presented with SpO2 of 88% on RA with tachypnea in the 20s. Initially, likely secondary to aspiration pneumonia.  - She is still requiring supplemental O2 at this time, suspect she is somewhat volume overloaded (net +3.9L since admission) given amount of fluids she received for DKA, noted mild LE edema and bilateral crackles on exam - slightly fluid overloaded, was given 2 doses of IV lasix and 1600 ml urine out yesterday.  -she remains on oxygen, led edema improving with IV lasix.  But her repeat CXR shows worsening of the left lung and right lung base opacities, suspect ongoing aspiration despite change in diet and antibiotics.  - SLP eval done, MBS done, showing worsening of her dysphagia due to her cognitive impairment.  - she remains on 3 to 4 lit of [place] oxygen.  Family is still deciding about home hosp[facility].  She continuous to decline .     # Sepsis  - Presented with leukocytosis to 12.6, tachypnea 27, lactic acid elevated to 3.4 with AMS and AKI, with concern for underlying aspiration pneumonia and  UTI - resolved.    # Aspiration pneumonia - Concern for aspiration pneumonia given encephalopathy with findings concerning for old emesis on oral care  - CXR (10/22) showing new confluent new retrocardiac opacity on the left compatible with new or increased left lower lung collapse or consolidation - RPR negative - Procalcitonin 0.21 on 10/22 - she remains afebrile, with WBC count wnl.  - but her repeat CXR shows worsening of the opacities, suspect continual aspiration .  - discussed with the patient's grand daughter over the phone.    #Hypertension Optimal BP parameters.  Continue with amlodipine , avapro and metoprolol  75 mg BID.    # Hypernatremia - resolved - Initially elevated Na in the setting of dehydration and osmotic diuresis from DKA then resolved. Sodium wnl.    # Dysphagia - Patient presented with encephalopathy secondary to DKA complicated by aspiration pneumonia. Her mental status has improved. However, she remains dysphagic. SLP is following and has recommended a dysphagia 1 diet with nectar thick liquids. -  Family not interested in Cortrak anymore. MBS done, and showed worsening dysphagia.    #  DKA - resolved Uncontrolled DM with episodes of of hypoglycemia - Presented with hyperglycemia to 770s in the setting of not receiving insulin  at SNF during 5-day stay. BhB elevated to > 8 on admission - BhB wnl - CBG (last 3)  Recent Labs    06/28/24 0818 06/28/24 1206 06/28/24 1604  GLUCAP 208* 240* 204*   Stopped all insulin  regimens except for SSI as patient has poor oral intake.    #Lactic acidosis - resolved - Episodes of recurrent lactic acidosis, possibly secondary to aspiration pneumonia and dehydration -  resolved.    # High anion gap metabolic acidosis - resolved - Likely secondary to lactic acidosis and DKA - Continue management of underlying causes as above   # Possible UTI - UA concerning for UTI. Patient encephalopathic and unable to reliably  communicate symptoms - History of ESBL E. Coli - Urine culture showing greater than 100,000 colonies of multiple species and suggested recollection.  However, patient receiving antibiotics with clinical improvement, so repeat UA/UCx futile at this point - Completed 7 days of meropenem   # Hypokalemia - resolved   #Acute metabolic encephalopathy  - Likely secondary to acute illness caused by aspiration pneumonia in the setting of underlying dementia - she is oriented to person only , when awake.  - discussed about getting another CT  head, but family is considering hospice.  - she is refusing to get up and eat meals most of the time. She is only taking food when family is at bedside.    #AKI on CKD stage IIIa - resolved - Creatinine elevated 2.74 on admission, baseline around 0.9 - Likely prerenal in the setting of dehydration from vomiting and osmotic diuresis - Improved with IVFs - creatinine stable around 0.8   #Elevated troponins - High-sensitivity troponins peaked at 192 - Likely secondary to acute illness, aspiration pneumonia, DKA  - Echo showed LVEF 70 to 75% with no regional wall motion normalities.  Noted severe asymmetric left ventricular hypertrophy of the basal septal segment - Cardiology evaluated - indicated unlikely ACS if no abnormalities on echo - she was started on heparin  gtt and discontinued on 10/22.  - currently chest pain free.    #Dementia #Anxiety #Depression - Home meds include sertraline  50 mg daily, Klonopin  0.5 mg daily, Depakote  125 mg twice daily, donepezil  5 mg daily, trazodone  50 mg nightly, risperidone  0.5 mg nightly. Initially held due to AMS and NPO.  Patient's grand daughter is concerned that patient remains sleepy most of the time, we discussed about decreasing the dose of clonazepam  but family does not  want to do it as she has been on these meds for more than 10 years. She continues to refuse getting up and has minimal oral intake.  Patient  continues to decline  despite completing antibiotics.      RN Pressure Injury Documentation: Wound 06/23/24 1430 Pressure Injury Coccyx Mid Stage 3 -  Full thickness tissue loss. Subcutaneous fat may be visible but bone, tendon or muscle are NOT exposed. (Active)     Wound 06/23/24 1430 Pressure Injury Heel Left Deep Tissue Pressure Injury - Purple or maroon localized area of discolored intact skin or blood-filled blister due to damage of underlying soft tissue from pressure and/or shear. (Active)  Wound care consulted and recommendations given.   Malnutrition Type:  Nutrition Problem: Inadequate oral intake Etiology: chronic illness (dementia)   Malnutrition Characteristics:  Signs/Symptoms: per patient/family report   Nutrition Interventions:  Interventions: Ensure Enlive (each supplement  provides 350kcal and 20 grams of protein), Tube feeding  Estimated body mass index is 24.55 kg/m as calculated from the following:   Height as of this encounter: 5' 6 (1.676 m).   Weight as of this encounter: 69 kg.  Code Status: DNR limited.  DVT Prophylaxis:  heparin  injection 5,000 Units Start: 06/16/24 2200   Level of Care: Level of care: Progressive Family Communication: none at bedside today. Discussed with daughter over the phone.   Disposition Plan:     Remains inpatient appropriate:  home with hospice.   Procedures:  None.   Consultants:   Palliative care.   Antimicrobials:   Anti-infectives (From admission, onward)    Start     Dose/Rate Route Frequency Ordered Stop   06/17/24 2200  vancomycin  (VANCOCIN ) IVPB 1000 mg/200 mL premix  Status:  Discontinued        1,000 mg 200 mL/hr over 60 Minutes Intravenous Every 48 hours 06/16/24 0935 06/16/24 1810   06/16/24 0400  meropenem (MERREM) 1 g in sodium chloride  0.9 % 100 mL IVPB        1 g 200 mL/hr over 30 Minutes Intravenous Every 12 hours 06/16/24 0335 06/22/24 2106   06/15/24 1900  vancomycin  (VANCOREADY) IVPB 1500  mg/300 mL        1,500 mg 150 mL/hr over 120 Minutes Intravenous  Once 06/15/24 1853 06/15/24 2357   06/15/24 1853  vancomycin  variable dose per unstable renal function (pharmacist dosing)  Status:  Discontinued         Does not apply See admin instructions 06/15/24 1853 06/16/24 0935   06/15/24 1845  cefTRIAXone  (ROCEPHIN ) 2 g in sodium chloride  0.9 % 100 mL IVPB  Status:  Discontinued       Note to Pharmacy: Please confirm patient has had blood and urine cultures prior to antibiotic administration.   2 g 200 mL/hr over 30 Minutes Intravenous Every 24 hours 06/15/24 1836 06/16/24 0335        Medications  Scheduled Meds:  amLODipine   10 mg Oral Daily   clonazePAM   0.5 mg Oral Daily   divalproex   250 mg Oral BID   donepezil   5 mg Oral QHS   feeding supplement  237 mL Oral BID BM   heparin  injection (subcutaneous)  5,000 Units Subcutaneous Q8H   insulin  aspart  0-5 Units Subcutaneous QHS   insulin  aspart  0-9 Units Subcutaneous TID WC   insulin  glargine-yfgn  8 Units Subcutaneous Q24H   irbesartan  300 mg Oral Daily   metoprolol  tartrate  75 mg Oral BID   nystatin  5 mL Oral QID   Continuous Infusions: PRN Meds:.acetaminophen , dextrose , hydrALAZINE , labetalol     Subjective:   Kristen Bowman was seen and examined today.   No new events.   Objective:   Vitals:   06/28/24 0820 06/28/24 0824 06/28/24 1207 06/28/24 1605  BP: (!) 165/53  (!) 129/56 (!) 159/59  Pulse: 78  69 76  Resp: 13 13 19 19   Temp: 97.9 F (36.6 C)  98.2 F (36.8 C) 97.9 F (36.6 C)  TempSrc: Oral  Oral Oral  SpO2: 97%  94% 95%  Weight:      Height:        Intake/Output Summary (Last 24 hours) at 06/28/2024 1814 Last data filed at 06/28/2024 1330 Gross per 24 hour  Intake 0 ml  Output 700 ml  Net -700 ml   Filed Weights   06/15/24 1552 06/15/24 1953 06/21/24 1113  Weight: 74.8  kg 62.8 kg 69 kg     Exam General exam: ill appearing lady not in distress.  Respiratory system: air entry fair   Cardiovascular system: S1 & S2 heard, RRR.  Gastrointestinal system: Abdomen is soft bs+ Central nervous system: Alert , oriented to person. Extremities: no edema.    Data Reviewed:  I have personally reviewed following labs and imaging studies   CBC Lab Results  Component Value Date   WBC 5.6 06/24/2024   RBC 3.41 (L) 06/24/2024   HGB 9.6 (L) 06/24/2024   HCT 29.9 (L) 06/24/2024   MCV 87.7 06/24/2024   MCH 28.2 06/24/2024   PLT 218 06/24/2024   MCHC 32.1 06/24/2024   RDW 15.4 06/24/2024   LYMPHSABS 1.1 06/15/2024   MONOABS 1.0 06/15/2024   EOSABS 0.0 06/15/2024   BASOSABS 0.0 06/15/2024     Last metabolic panel Lab Results  Component Value Date   NA 136 06/24/2024   K 4.0 06/24/2024   CL 98 06/24/2024   CO2 26 06/24/2024   BUN 21 06/24/2024   CREATININE 0.86 06/24/2024   GLUCOSE 110 (H) 06/24/2024   GFRNONAA >60 06/24/2024   GFRAA 55 (L) 01/26/2020   CALCIUM  8.2 (L) 06/24/2024   PHOS 2.3 (L) 06/18/2024   PROT 5.7 (L) 06/15/2024   PROT 5.7 (L) 06/15/2024   ALBUMIN  2.5 (L) 06/15/2024   ALBUMIN  2.5 (L) 06/15/2024   BILITOT 2.3 (H) 06/15/2024   BILITOT 2.2 (H) 06/15/2024   ALKPHOS 65 06/15/2024   ALKPHOS 65 06/15/2024   AST 14 (L) 06/15/2024   AST 14 (L) 06/15/2024   ALT 11 06/15/2024   ALT 11 06/15/2024   ANIONGAP 12 06/24/2024    CBG (last 3)  Recent Labs    06/28/24 0818 06/28/24 1206 06/28/24 1604  GLUCAP 208* 240* 204*      Coagulation Profile: No results for input(s): INR, PROTIME in the last 168 hours.   Radiology Studies: No results found.      Elgie Butter M.D. [facility] 06/28/2024, 6:14 PM  Available via Epic secure chat 7am-7pm After 7 pm, please refer to night coverage provider listed on amion.

## 2024-06-29 DIAGNOSIS — J690 Pneumonitis due to inhalation of food and vomit: Secondary | ICD-10-CM | POA: Diagnosis not present

## 2024-06-29 DIAGNOSIS — F0390 Unspecified dementia without behavioral disturbance: Secondary | ICD-10-CM | POA: Diagnosis not present

## 2024-06-29 DIAGNOSIS — R4182 Altered mental status, unspecified: Secondary | ICD-10-CM | POA: Diagnosis not present

## 2024-06-29 LAB — GLUCOSE, CAPILLARY
Glucose-Capillary: 197 mg/dL — ABNORMAL HIGH (ref 70–99)
Glucose-Capillary: 256 mg/dL — ABNORMAL HIGH (ref 70–99)
Glucose-Capillary: 187 mg/dL — ABNORMAL HIGH (ref 70–99)

## 2024-06-29 NOTE — Progress Notes (Signed)
 AVS in packet for transpoprt

## 2024-06-29 NOTE — Discharge Summary (Signed)
 Physician Discharge Summary   Patient: Kristen Bowman MRN: [id number] DOB: [birth year]  Admit date:     06/15/2024  Discharge date: 06/29/24  Discharge Physician: Elgie Butter   PCP: Valentin Skates, DO   Recommendations at discharge:  Please follow up with hospice MD as needed.   Discharge Diagnoses: Principal Problem:   AMS (altered mental status) Active Problems:   Tachycardia   Primary hypertension   Dementia without behavioral disturbance (HCC)   Elevated troponin   Pressure injury of skin   DKA (diabetic ketoacidosis) (HCC)   Acute hypoxic respiratory failure (HCC)   Aspiration pneumonia (HCC)   Hypernatremia  Resolved Problems:   * No resolved hospital problems. [facility] Course: The patient is a 89 year old female on hospice care with a PMHx of dementia, CKD stage II, T2DM, HTN, anemia who presented to the ED on 06/15/2024 from [facility] for increased lethargy, hypoxia, and hyperglycemia, found to be in DKA with acute hypoxic respiratory failure and sepsis secondary to aspiration pneumonia.  No new events overnight.  Plan for discharge home with hospice tomorrow afternoon after daughter and grand daughter decides.   Assessment and Plan:  # Acute hypoxic respiratory failure - improving - Presented with SpO2 of 88% on RA with tachypnea in the 20s. Initially, likely secondary to aspiration pneumonia.  - She is still requiring supplemental O2 at this time, suspect she is somewhat volume overloaded (net +3.9L since admission) given amount of fluids she received for DKA, noted mild LE edema and bilateral crackles on exam - slightly fluid overloaded, was given 2 doses of IV lasix and 1600 ml urine out yesterday.  -she remains on oxygen, led edema improving with IV lasix.  But her repeat CXR shows worsening of the left lung and right lung base opacities, suspect ongoing aspiration despite change in diet and antibiotics.  - SLP eval done, MBS done, showing worsening of  her dysphagia due to her cognitive impairment.  - she remains on 3 to 4 lit of [place] oxygen.  Family is still deciding about home [facility]       # Sepsis  - Presented with leukocytosis to 12.6, tachypnea 27, lactic acid elevated to 3.4 with AMS and AKI, with concern for underlying aspiration pneumonia and UTI - resolved.    # Aspiration pneumonia - Concern for aspiration pneumonia given encephalopathy with findings concerning for old emesis on oral care  - CXR (10/22) showing new confluent new retrocardiac opacity on the left compatible with new or increased left lower lung collapse or consolidation - RPR negative - Procalcitonin 0.21 on 10/22 - she remains afebrile, with WBC count wnl.  - but her repeat CXR shows worsening of the opacities, suspect continual aspiration .  - discussed with the patient's grand daughter over the phone.    #Hypertension Optimal BP parameters.  Continue with amlodipine , avapro and metoprolol  75 mg BID.    # Hypernatremia - resolved - Initially elevated Na in the setting of dehydration and osmotic diuresis from DKA then resolved. Sodium wnl.    # Dysphagia - Patient presented with encephalopathy secondary to DKA complicated by aspiration pneumonia. Her mental status has improved. However, she remains dysphagic. SLP is following and has recommended a dysphagia 1 diet with nectar thick liquids. -  Family not interested in [place] anymore. MBS done, and showed worsening dysphagia.    # DKA - resolved Uncontrolled DM with episodes of of hypoglycemia - Presented with hyperglycemia  to 770s in the setting of not receiving insulin  at SNF during 5-day stay. BhB elevated to > 8 on admission - BhB wnl Stopped all insulin  regimens except for SSI as patient has poor oral intake.    #Lactic acidosis - resolved - Episodes of recurrent lactic acidosis, possibly secondary to aspiration pneumonia and dehydration -  resolved.    # High  anion gap metabolic acidosis - resolved - Likely secondary to lactic acidosis and DKA - Continue management of underlying causes as above   # Possible UTI - UA concerning for UTI. Patient encephalopathic and unable to reliably communicate symptoms - History of ESBL E. Coli - Urine culture showing greater than 100,000 colonies of multiple species and suggested recollection.  However, patient receiving antibiotics with clinical improvement, so repeat UA/UCx futile at this point - Completed 7 days of meropenem   # Hypokalemia - resolved   #Acute metabolic encephalopathy  - Likely secondary to acute illness caused by aspiration pneumonia in the setting of underlying dementia - she is oriented to person only , when awake.  - discussed about getting another CT  head, but family is considering hospice.  - she is refusing to get up and eat meals most of the time. She is only taking food when family is at bedside.    #AKI on CKD stage IIIa - resolved - Creatinine elevated 2.74 on admission, baseline around 0.9 - Likely prerenal in the setting of dehydration from vomiting and osmotic diuresis - Improved with IVFs - creatinine stable around 0.8   #Elevated troponins - High-sensitivity troponins peaked at 192 - Likely secondary to acute illness, aspiration pneumonia, DKA  - Echo showed LVEF 70 to 75% with no regional wall motion normalities.  Noted severe asymmetric left ventricular hypertrophy of the basal septal segment - Cardiology evaluated - indicated unlikely ACS if no abnormalities on echo - she was started on heparin  gtt and discontinued on 10/22.  - currently chest pain free.    #Dementia #Anxiety #Depression - Home meds include sertraline  50 mg daily, Klonopin  0.5 mg daily, Depakote  125 mg twice daily, donepezil  5 mg daily, trazodone  50 mg nightly, risperidone  0.5 mg nightly. Initially held due to AMS and NPO.  Patient's grand daughter is concerned that patient remains sleepy most  of the time, we discussed about decreasing the dose of clonazepam  but family does not  want to do it as she has been on these meds for more than 10 years. She continues to refuse getting up and has minimal oral intake.  Patient continues to decline  despite completing antibiotics.        RN Pressure Injury Documentation: Wound 06/23/24 1430 Pressure Injury Coccyx Mid Stage 3 -  Full thickness tissue loss. Subcutaneous fat may be visible but bone, tendon or muscle are NOT exposed. (Active)     Wound 06/23/24 1430 Pressure Injury Heel Left Deep Tissue Pressure Injury - Purple or maroon localized area of discolored intact skin or blood-filled blister due to damage of underlying soft tissue from pressure and/or shear. (Active)  Wound care consulted and recommendations given.    Malnutrition Type:   Nutrition Problem: Inadequate oral intake Etiology: chronic illness (dementia)     Malnutrition Characteristics:   Signs/Symptoms: per patient/family report     Nutrition Interventions:   Interventions: Ensure Enlive (each supplement provides 350kcal and 20 grams of protein), Tube feeding   Estimated body mass index is 24.55 kg/m as calculated from the following:   Height as  of this encounter: 5' 6 (1.676 m).   Weight as of this encounter: 69 kg.    Consultants: palliative Procedures performed: none  Disposition: Hospice care Diet recommendation:  Dysphagia type 1 nectar thick Liquid DISCHARGE MEDICATION: Allergies as of 06/29/2024       Reactions   Ace Inhibitors Shortness Of Breath, Swelling   Tape Other (See Comments)   Skin bruises and tears easily        Medication List     STOP taking these medications    aspirin  EC 325 MG tablet   risperiDONE  0.5 MG tablet Commonly known as: RISPERDAL    sertraline  50 MG tablet Commonly known as: ZOLOFT    traZODone  50 MG tablet Commonly known as: DESYREL        TAKE these medications    amLODipine  10 MG  tablet Commonly known as: NORVASC  Take 1 tablet (10 mg total) by mouth daily.   atorvastatin  40 MG tablet Commonly known as: LIPITOR Take 40 mg by mouth daily.   clonazePAM  0.5 MG tablet Commonly known as: KLONOPIN  Take 0.5 mg by mouth daily.   divalproex  125 MG capsule Commonly known as: Depakote  Sprinkles Take 2 capsules (250 mg total) by mouth 2 (two) times daily.   donepezil  5 MG tablet Commonly known as: ARICEPT  Take 1 tablet (5 mg total) by mouth at bedtime.   feeding supplement Liqd Take 237 mLs by mouth 2 (two) times daily between meals.   melatonin 3 MG Tabs tablet Take 3 mg by mouth at bedtime.   metFORMIN  500 MG tablet Commonly known as: GLUCOPHAGE  Take 750 mg by mouth daily with breakfast.   metoprolol  tartrate 25 MG tablet Commonly known as: LOPRESSOR  Take 75 mg by mouth 2 (two) times daily.   olmesartan 40 MG tablet Commonly known as: BENICAR Take 40 mg by mouth daily.        Follow-up Information     Valentin Skates, DO Follow up.   Specialty: Internal Medicine Contact information: [address] [place] [address] [phone]         [facility] Follow up.   Why: Hospice RN-office to call with visit times. Contact information: Address: [address], [place], [place] [address] Phone: [phone]               Discharge Exam: Filed Weights   06/15/24 1552 06/15/24 1953 06/21/24 1113  Weight: 74.8 kg 62.8 kg 69 kg   General exam: Appears calm and comfortable  Respiratory system: Clear to auscultation. Respiratory effort normal. Cardiovascular system: S1 & S2 heard, RRR. No JVD,  Gastrointestinal system: Abdomen is nondistended, soft and nontender.  Central nervous system: Alert and oriented. No focal neurological deficits. Extremities: Symmetric 5 x 5 power. Skin: No rashes, Psychiatry: Mood & affect appropriate.    Condition at discharge: fair  The results of significant diagnostics from this hospitalization  (including imaging, microbiology, ancillary and laboratory) are listed below for reference.   Imaging Studies: DG Swallowing Func-Speech Pathology Result Date: 06/24/2024 Table formatting from the original result was not included. Modified Barium Swallow Study Patient Details Name: Kristen Bowman MRN: [id number] Date of Birth: 03-26-1931 Today's Date: 06/24/2024 HPI/PMH: HPI: Patient is a 89 y.o. female with past medical history of diabetes mellitus type 2, hypertension, history of E. Coli UTI and associated encephalopahty, right bundle branch block, chronic anemia, chronic kidney disease stage II, anemia, dementia, depression independent and oriented at baseline come from AMS and hyperglycemia. Patient was receiving respite care at a SNF  and was admitted to the hospital on 10/21 secondary to AMS hyperglycemia (daughter suspects patient was not being given her insulin ) and altered mental status and UA was positive for UTI. Also with PNA, suspected aspiration. Per RN note from 10/22, during oral care, suspected emesis in oral cavity and around dentures. Bedside swallow evaluation ordered to assess patient's swallow. Patient placed on dys 2 nectar thick diet. SLP signed off. New swallow evaluation ordered on 06/23/24 as family is requesting a change to puree diet. Clinical Impression: Clinical Impression: MBS complete. Unfortunately, patient's swallowing function is worse than it appears at bedside and possibly has declined even since initially evaluated by SLP. Oral and pharyngeal strength largely WFL with only mild base of tongue weakness resulting in trace-minimal vallecular residuals post swallow. Primary deficit is combination of age and cognitively impacted.  Timing of swallow is inconsistent, at times with swallow triggered at the vallecula with resultant intact airway protection, even with thin liquids. In at least 50% of opportunities however, swallow is triggered either once the bolus has reached the vocal  cords or at the pyriform sinuses and it is during these times that the bolus is either penetrated to the level fo the vocal cords or aspirated, sensing aspiration in less than 25% of episodes with often ineffective cough response to clear. Attempted to faciliate chin tuck using straw sips of liquids however this only worsens airway protection. Additionally, vallecular residuals, while minimal, quickly spill to the pyriform sinuses post swallow without awareness or spontaneous dry swallow and are intermittently aspirated silently post swallow. Cognitive function does not allow for consistent carry out of throat clearing or coughing which otherwise would likely be functional to clear aspirates. Nectar thick liquids aspirated in slightly few instances than thin liquids and honey thick liquids not aspirated at all during today's study, although would not, in this SLPs opinion be a viable option in significantly decreasing aspiration risk and may only increase risk of development of an aspiration related infection and increase risk of dehydration. Attempted to contact patient's daugther via phone to discuss without answer. Given that focus is on comfort and that plans are for home with Hospice services after d/c, will keep diet as is for now until discussion can be had with daughter. Ultimately, family may wish to consider liberalizing diet to dysphagia 1 (puree) or even regular (if patient alert and not pocketing) and thin liquids for pleasure and decreased risk of dehydration. Factors that may increase risk of adverse event in presence of aspiration Noe & Lianne 2021): Factors that may increase risk of adverse event in presence of aspiration Noe & Lianne 2021): Respiratory or GI disease; Reduced cognitive function; Limited mobility; Frail or deconditioned Recommendations/Plan: Swallowing Evaluation Recommendations Swallowing Evaluation Recommendations Recommendations: PO diet PO Diet Recommendation: Dysphagia  1 (Pureed); Thin liquids (Level 0) (with known risk of aspiration, need to discuss with daughter first) Liquid Administration via: Cup; Straw Medication Administration: Crushed with puree Supervision: Staff to assist with self-feeding; Full supervision/cueing for swallowing strategies Swallowing strategies  : Slow rate; Small bites/sips; Clear throat intermittently; Hard cough after swallowing Postural changes: Position pt fully upright for meals Oral care recommendations: Oral care BID (2x/day) Treatment Plan Treatment Plan Treatment recommendations: Therapy as outlined in treatment plan below Follow-up recommendations: No SLP follow up Functional status assessment: Patient has had a recent decline in their functional status and demonstrates the ability to make significant improvements in function in a reasonable and predictable amount of time. Recommendations Recommendations for follow  up therapy are one component of a multi-disciplinary discharge planning process, led by the attending physician.  Recommendations may be updated based on patient status, additional functional criteria and insurance authorization. Assessment: Orofacial Exam: Orofacial Exam Oral Cavity: Oral Hygiene: WFL Oral Cavity - Dentition: Dentures, not available Orofacial Anatomy: WFL Oral Motor/Sensory Function: WFL Anatomy: Anatomy: WFL Boluses Administered: Boluses Administered Boluses Administered: Thin liquids (Level 0); Mildly thick liquids (Level 2, nectar thick); Moderately thick liquids (Level 3, honey thick); Puree; Solid  Oral Impairment Domain: Oral Impairment Domain Lip Closure: No labial escape Tongue control during bolus hold: Cohesive bolus between tongue to palatal seal Bolus preparation/mastication: Timely and efficient chewing and mashing (with puree) Bolus transport/lingual motion: Brisk tongue motion Oral residue: Trace residue lining oral structures Location of oral residue : Tongue; Palate Initiation of pharyngeal swallow  : Valleculae; Pyriform sinuses (inconsistent)  Pharyngeal Impairment Domain: Pharyngeal Impairment Domain Soft palate elevation: No bolus between soft palate (SP)/pharyngeal wall (PW) Laryngeal elevation: Complete superior movement of thyroid  cartilage with complete approximation of arytenoids to epiglottic petiole Anterior hyoid excursion: Complete anterior movement Epiglottic movement: Complete inversion Laryngeal vestibule closure: Complete, no air/contrast in laryngeal vestibule Pharyngeal stripping wave : Present - complete Pharyngeal contraction (A/P view only): N/A Pharyngoesophageal segment opening: Complete distension and complete duration, no obstruction of flow Tongue base retraction: Trace column of contrast or air between tongue base and PPW Pharyngeal residue: Collection of residue within or on pharyngeal structures Location of pharyngeal residue: Valleculae  Esophageal Impairment Domain: Esophageal Impairment Domain Esophageal clearance upright position: Complete clearance, esophageal coating Pill: Pill Consistency administered: -- (NT) Penetration/Aspiration Scale Score: Penetration/Aspiration Scale Score 1.  Material does not enter airway: Puree; Solid; Moderately thick liquids (Level 3, honey thick) 5.  Material enters airway, CONTACTS cords and not ejected out: Thin liquids (Level 0); Mildly thick liquids (Level 2, nectar thick) 7.  Material enters airway, passes BELOW cords and not ejected out despite cough attempt by patient: Thin liquids (Level 0) (with max cues) 8.  Material enters airway, passes BELOW cords without attempt by patient to eject out (silent aspiration) : Thin liquids (Level 0); Mildly thick liquids (Level 2, nectar thick) Compensatory Strategies: Compensatory Strategies Compensatory strategies: Yes Straw: Ineffective Ineffective Straw: Thin liquid (Level 0) Chin tuck: Ineffective Ineffective Chin Tuck: Thin liquid (Level 0)   General Information: Caregiver present: No  Diet  Prior to this Study: Dysphagia 2 (finely chopped); Mildly thick liquids (Level 2, nectar thick)   Temperature : Febrile   Respiratory Status: WFL   Supplemental O2: Nasal cannula   History of Recent Intubation: No  Behavior/Cognition: Alert; Cooperative; Pleasant mood; Requires cueing; Distractible Self-Feeding Abilities: Able to self-feed; Needs set-up for self-feeding Baseline vocal quality/speech: Normal Volitional Cough: Able to elicit (inconsistent due to mentation) Volitional Swallow: Able to elicit No data recorded Goal Planning: Prognosis for improved oropharyngeal function: Guarded Barriers to Reach Goals: Cognitive deficits; Overall medical prognosis No data recorded Patient/Family Stated Goal: None stated No data recorded Pain: Pain Assessment Pain Assessment: Faces Faces Pain Scale: 0 Breathing: 0 Negative Vocalization: 0 Facial Expression: 0 Body Language: 0 Consolability: 0 PAINAD Score: 0 Facial Expression: 0 Body Movements: 0 Muscle Tension: 0 Compliance with ventilator (intubated pts.): N/A Vocalization (extubated pts.): N/A CPOT Total: 0 End of Session: Start Time:SLP Start Time (ACUTE ONLY): 0940 Stop Time: SLP Stop Time (ACUTE ONLY): 1000 Time Calculation:SLP Time Calculation (min) (ACUTE ONLY): 20 min Charges: SLP Evaluations $ SLP Speech Visit: 1 Visit SLP Evaluations $BSS  Swallow: 1 Procedure $MBS Swallow: 1 Procedure SLP visit diagnosis: SLP Visit Diagnosis: Dysphagia, oropharyngeal phase (R13.12) Past Medical History: Past Medical History: Diagnosis Date  Anemia   Anxiety states   Diabetes mellitus   T2DM with renal manifestations  Generalized osteoarthrosis, unspecified site   Hematuria   Hyperlipidemia   mixed  Hypertension   Irritable bowel syndrome   Other specified cardiac dysrhythmias(427.89)   atrial fibrilation  Panic disorder   Polymyalgia rheumatica   Proteinuria 05/29/10  Type II or unspecified type diabetes mellitus with renal manifestations, uncontrolled(250.42) 05/29/10  CKD II  (mild) Past Surgical History: Past Surgical History: Procedure Laterality Date  CATARACT EXTRACTION    right  COLONOSCOPY N/A 11/04/2012  Procedure: COLONOSCOPY;  Surgeon: Elsie Cree, MD;  Location: [facility];  Service: Endoscopy;  Laterality: N/A;  ESOPHAGOGASTRODUODENOSCOPY N/A 11/04/2012  Procedure: ESOPHAGOGASTRODUODENOSCOPY (EGD);  Surgeon: Elsie Cree, MD;  Location: [facility];  Service: Endoscopy;  Laterality: N/A;  TUBAL LIGATION   Leah McCoy MA, CCC-SLP McCoy Leah Meryl 06/24/2024, 10:40 AM  DG CHEST PORT 1 VIEW Result Date: 06/23/2024 CLINICAL DATA:  Follow-up left lower lobe infection. EXAM: PORTABLE CHEST 1 VIEW COMPARISON:  Radiograph 06/20/2024, remote chest CT reviewed. FINDINGS: Worsening left lung base opacity with developing pleural effusion. Stable cardiomegaly. Unchanged mediastinal contours with aortic atherosclerosis. Mild patchy opacity at the right lung base. No pneumothorax. IMPRESSION: 1. Worsening left lung base opacity with developing pleural effusion. 2. Mild patchy opacity at the right lung base, atelectasis versus pneumonia. Electronically Signed   By: Andrea Gasman M.D.   On: 06/23/2024 15:01   DG CHEST PORT 1 VIEW Result Date: 06/20/2024 EXAM: 1 VIEW(S) XRAY OF THE CHEST 06/20/2024 06:12:00 PM COMPARISON: Chest x-ray 06/16/2024. CLINICAL HISTORY: 89973 Shortness of breath 10026. SOB Shortness of breath 10026. SOB FINDINGS: LUNGS AND PLEURA: There are reticular and patchy opacities in the left lower lung, likely infection. This has mildly increased. No pulmonary edema. No pleural effusion. No pneumothorax. HEART AND MEDIASTINUM: There are atherosclerotic calcifications of the aorta. The heart is mildly enlarged. BONES AND SOFT TISSUES: No acute osseous abnormality. IMPRESSION: 1. Mildly increased reticular and patchy opacities in the left lower lung, likely infection. 2. Mildly enlarged heart. Electronically signed by: Greig Pique MD 06/20/2024 07:38 PM EDT RP  Workstation: [id number]   ECHOCARDIOGRAM COMPLETE Result Date: 06/16/2024    ECHOCARDIOGRAM REPORT   Patient Name:   Kenndra A Kemp Date of Exam: 06/16/2024 Medical Rec #:  [id number]     Height:       66.0 in Accession #:    [id number]    Weight:       138.4 lb Date of Birth:  1931-08-16     BSA:          1.710 m Patient Age:    89 years      BP:           114/40 mmHg Patient Gender: F             HR:           110 bpm. Exam Location:  Inpatient Procedure: 2D Echo, Cardiac Doppler and Color Doppler (Both Spectral and Color            Flow Doppler were utilized during procedure). Indications:    NSTEMI I21.4  History:        Patient has no prior history of Echocardiogram examinations.  Risk Factors:Hypertension and Diabetes.  Sonographer:    Jayson Gaskins Referring Phys: CAROLE N HALL IMPRESSIONS  1. Left ventricular ejection fraction, by estimation, is 70 to 75%. The left ventricle has hyperdynamic function. The left ventricle has no regional wall motion abnormalities. There is severe asymmetric left ventricular hypertrophy of the basal-septal segment. Left ventricular diastolic parameters were normal.  2. Right ventricular systolic function is normal. The right ventricular size is normal.  3. The mitral valve is degenerative. Trivial mitral valve regurgitation. Mild mitral stenosis. The mean mitral valve gradient is 6.0 mmHg with average heart rate of 112 bpm. Severe mitral annular calcification.  4. The aortic valve was not well visualized. There is moderate calcification of the aortic valve. There is moderate thickening of the aortic valve. Aortic valve regurgitation is not visualized. Aortic valve sclerosis is present, with no evidence of aortic valve stenosis.  5. The inferior vena cava is normal in size with greater than 50% respiratory variability, suggesting right atrial pressure of 3 mmHg. Comparison(s): Prior images unable to be directly viewed, comparison made by report only.  Conclusion(s)/Recommendation(s): Otherwise normal echocardiogram, with minor abnormalities described in the report. FINDINGS  Left Ventricle: Left ventricular ejection fraction, by estimation, is 70 to 75%. The left ventricle has hyperdynamic function. The left ventricle has no regional wall motion abnormalities. The left ventricular internal cavity size was normal in size. There is severe asymmetric left ventricular hypertrophy of the basal-septal segment. Left ventricular diastolic parameters were normal. Right Ventricle: The right ventricular size is normal. No increase in right ventricular wall thickness. Right ventricular systolic function is normal. Left Atrium: Left atrial size was normal in size. Right Atrium: Right atrial size was normal in size. Pericardium: Trivial pericardial effusion is present. Mitral Valve: The mitral valve is degenerative in appearance. There is moderate thickening of the mitral valve leaflet(s). There is moderate calcification of the mitral valve leaflet(s). Severe mitral annular calcification. Trivial mitral valve regurgitation. Mild mitral valve stenosis. MV peak gradient, 22.4 mmHg. The mean mitral valve gradient is 6.0 mmHg with average heart rate of 112 bpm. Tricuspid Valve: The tricuspid valve is grossly normal. Tricuspid valve regurgitation is mild. Aortic Valve: The aortic valve was not well visualized. There is moderate calcification of the aortic valve. There is moderate thickening of the aortic valve. Aortic valve regurgitation is not visualized. Aortic valve sclerosis is present, with no evidence of aortic valve stenosis. Aortic valve mean gradient measures 9.0 mmHg. Aortic valve peak gradient measures 13.7 mmHg. Aortic valve area, by VTI measures 2.00 cm. Pulmonic Valve: The pulmonic valve was not well visualized. Pulmonic valve regurgitation is trivial. Aorta: The aortic root is normal in size and structure. Venous: The inferior vena cava is normal in size with  greater than 50% respiratory variability, suggesting right atrial pressure of 3 mmHg. IAS/Shunts: No atrial level shunt detected by color flow Doppler.  LEFT VENTRICLE PLAX 2D LVIDd:         3.80 cm   Diastology LVIDs:         2.50 cm   LV e' medial:    7.83 cm/s LV PW:         1.00 cm   LV E/e' medial:  16.5 LV IVS:        1.99 cm   LV e' lateral:   13.10 cm/s LVOT diam:     1.80 cm   LV E/e' lateral: 9.8 LV SV:         66 LV SV  Index:   39 LVOT Area:     2.54 cm  RIGHT VENTRICLE RV S prime:     18.20 cm/s TAPSE (M-mode): 2.5 cm LEFT ATRIUM             Index LA Vol (A2C):   24.9 ml 14.56 ml/m LA Vol (A4C):   45.5 ml 26.60 ml/m LA Biplane Vol: 37.1 ml 21.69 ml/m  AORTIC VALVE AV Area (Vmax):    2.16 cm AV Area (Vmean):   2.12 cm AV Area (VTI):     2.00 cm AV Vmax:           185.00 cm/s AV Vmean:          149.000 cm/s AV VTI:            0.332 m AV Peak Grad:      13.7 mmHg AV Mean Grad:      9.0 mmHg LVOT Vmax:         157.00 cm/s LVOT Vmean:        124.000 cm/s LVOT VTI:          0.261 m LVOT/AV VTI ratio: 0.79  AORTA Ao Root diam: 2.90 cm MITRAL VALVE MV Area (PHT): 5.54 cm     SHUNTS MV Area VTI:   1.62 cm     Systemic VTI:  0.26 m MV Peak grad:  22.4 mmHg    Systemic Diam: 1.80 cm MV Mean grad:  6.0 mmHg MV Vmax:       2.37 m/s MV Vmean:      149.5 cm/s MV Decel Time: 137 msec MV E velocity: 129.00 cm/s MV A velocity: 219.00 cm/s MV E/A ratio:  0.59 Shelda Bruckner MD Electronically signed by Shelda Bruckner MD Signature Date/Time: 06/16/2024/4:29:52 PM    Final    DG CHEST PORT 1 VIEW Result Date: 06/16/2024 EXAM: 1 VIEW(S) XRAY OF THE CHEST 06/16/2024 10:56:00 AM COMPARISON: Portable chest X-ray from 06/15/2024 and prior studies. CLINICAL HISTORY: 89 year old female with hypoxia. FINDINGS: LUNGS AND PLEURA: Confluent new retrocardiac opacity on the left compatible with new or increased left lower lung collapse or consolidation. Trace new pleural fluid or thickening along the right minor  fissure. Pneumothorax. No acute pulmonary edema. No other confluent lung opacity. HEART AND MEDIASTINUM: Advanced calcified aortic atherosclerosis. Stable cardiomegaly. BONES AND SOFT TISSUES: No acute osseous abnormality. IMPRESSION: 1. Confluent new retrocardiac opacity on the left compatible with partial lower lobe collapse or consolidation. 2. Trace new pleural fluid or thickening along the right minor fissure. No acute edema. 3. Stable cardiomegaly.  Advanced calcified aortic atherosclerosis. Electronically signed by: Helayne Hurst MD 06/16/2024 11:22 AM EDT RP Workstation: [id number]   DG Chest Portable 1 View Result Date: 06/15/2024 EXAM: 1 VIEW(S) XRAY OF THE CHEST 06/15/2024 02:33:00 PM COMPARISON: 03/29/2024 CLINICAL HISTORY: ALOC. Triage notes: Pt bib [facility] coming from [facility]. EMS called out for hyperglycemia and lethargy. Pt has been at facility since Friday. Per EMS, facility was unaware that patient is diabetic and pt has not had her blood sugar checked or received ; insulin . Pt lethargic during triage. Patient normally alert and talking at baseline, unsure if patient is normally oriented. Patient SpO2 88% with EMS on RA. High glucose read with EMS (above 600).; ; EMS VS:; 120/40; 80 HR; 90% 2L; 20G LAC; 500 CC NS FINDINGS: LUNGS AND PLEURA: Improved lung volumes. Central pulmonary vascular congestion without overt pulmonary edema. No focal pulmonary opacity. No pleural effusion. No pneumothorax. HEART AND MEDIASTINUM:  Borderline cardiomegaly. Calcified aortic atherosclerosis. BONES AND SOFT TISSUES: No acute osseous abnormality. IMPRESSION: 1. Central pulmonary vascular congestion without overt pulmonary edema. 2. Similar borderline cardiomegaly. Electronically signed by: Donnice Mania MD 06/15/2024 03:10 PM EDT RP Workstation: [id number]    Microbiology: Results for orders placed or performed during the hospital encounter of 06/15/24  Blood culture (routine x 2)     Status: None    Collection Time: 06/15/24  8:45 PM   Specimen: BLOOD RIGHT ARM  Result Value Ref Range Status   Specimen Description BLOOD RIGHT ARM  Final   Special Requests   Final    BOTTLES DRAWN AEROBIC AND ANAEROBIC Blood Culture results may not be optimal due to an inadequate volume of blood received in culture bottles   Culture   Final    NO GROWTH 5 DAYS Performed at [facility], [address] [place], [place] [address]    Report Status 06/20/2024 FINAL  Final  Blood culture (routine x 2)     Status: None   Collection Time: 06/15/24  8:46 PM   Specimen: BLOOD RIGHT HAND  Result Value Ref Range Status   Specimen Description BLOOD RIGHT HAND  Final   Special Requests   Final    BOTTLES DRAWN AEROBIC AND ANAEROBIC Blood Culture results may not be optimal due to an inadequate volume of blood received in culture bottles   Culture   Final    NO GROWTH 5 DAYS Performed at [facility], [address] [place], [place] [address]    Report Status 06/20/2024 FINAL  Final  Urine Culture (for pregnant, neutropenic or urologic patients or patients with an indwelling urinary catheter)     Status: Abnormal   Collection Time: 06/16/24  5:20 AM   Specimen: In/Out Cath Urine  Result Value Ref Range Status   Specimen Description IN/OUT CATH URINE  Final   Special Requests   Final    NONE Performed at [facility], [address] [place], [place] [address]    Culture (A)  Final    >=100,000 COLONIES/mL MULTIPLE SPECIES PRESENT, SUGGEST RECOLLECTION   Report Status 06/17/2024 FINAL  Final  MRSA culture     Status: None   Collection Time: 06/16/24 10:30 AM   Specimen: Nasal; Body Fluid  Result Value Ref Range Status   Specimen Description NASAL SWAB  Final   Special Requests NONE  Final   Culture   Final    NO STAPHYLOCOCCUS AUREUS ISOLATED Performed at [facility], [address], [place], [place] [address]    Report Status 06/20/2024 FINAL  Final  MRSA Next Gen by PCR,  Nasal     Status: None   Collection Time: 06/16/24 10:30 AM   Specimen: Nasal Mucosa; Nasal Swab  Result Value Ref Range Status   MRSA by PCR Next Gen NOT DETECTED NOT DETECTED Final    Comment: (NOTE) The GeneXpert MRSA Assay (FDA approved for NASAL specimens only), is one component of a comprehensive MRSA colonization surveillance program. It is not intended to diagnose MRSA infection nor to guide or monitor treatment for MRSA infections. Test performance is not FDA approved in patients less than 19 years old. Performed at [facility], [address] [place], [place] [address]   Respiratory (~20 pathogens) panel by PCR     Status: None   Collection Time: 06/16/24  5:25 PM   Specimen: Nasopharyngeal Swab; Respiratory  Result Value Ref Range  Status   Adenovirus NOT DETECTED NOT DETECTED Final   Coronavirus 229E NOT DETECTED NOT DETECTED Final    Comment: (NOTE) The Coronavirus on the Respiratory Panel, DOES NOT test for the novel  Coronavirus (2019 nCoV)    Coronavirus HKU1 NOT DETECTED NOT DETECTED Final   Coronavirus NL63 NOT DETECTED NOT DETECTED Final   Coronavirus OC43 NOT DETECTED NOT DETECTED Final   Metapneumovirus NOT DETECTED NOT DETECTED Final   Rhinovirus / Enterovirus NOT DETECTED NOT DETECTED Final   Influenza A NOT DETECTED NOT DETECTED Final   Influenza B NOT DETECTED NOT DETECTED Final   Parainfluenza Virus 1 NOT DETECTED NOT DETECTED Final   Parainfluenza Virus 2 NOT DETECTED NOT DETECTED Final   Parainfluenza Virus 3 NOT DETECTED NOT DETECTED Final   Parainfluenza Virus 4 NOT DETECTED NOT DETECTED Final   Respiratory Syncytial Virus NOT DETECTED NOT DETECTED Final   Bordetella pertussis NOT DETECTED NOT DETECTED Final   Bordetella Parapertussis NOT DETECTED NOT DETECTED Final   Chlamydophila pneumoniae NOT DETECTED NOT DETECTED Final   Mycoplasma pneumoniae NOT DETECTED NOT DETECTED Final    Comment: Performed at [facility], [address], [place], [place] [address]  Resp panel by RT-PCR (RSV, Flu A&B, Covid) Anterior Nasal Swab     Status: None   Collection Time: 06/17/24  9:15 AM   Specimen: Anterior Nasal Swab  Result Value Ref Range Status   SARS Coronavirus 2 by RT PCR NEGATIVE NEGATIVE Final   Influenza A by PCR NEGATIVE NEGATIVE Final   Influenza B by PCR NEGATIVE NEGATIVE Final    Comment: (NOTE) The Xpert Xpress SARS-CoV-2/FLU/RSV plus assay is intended as an aid in the diagnosis of influenza from Nasopharyngeal swab specimens and should not be used as a sole basis for treatment. Nasal washings and aspirates are unacceptable for Xpert Xpress SARS-CoV-2/FLU/RSV testing.  Fact Sheet for Patients: bloggercourse.com  Fact Sheet for Healthcare Providers: seriousbroker.it  This test is not yet approved or cleared by the [place]  FDA and has been authorized for detection and/or diagnosis of SARS-CoV-2 by FDA under an Emergency Use Authorization (EUA). This EUA will remain in effect (meaning this test can be used) for the duration of the COVID-19 declaration under Section 564(b)(1) of the Act, 21 U.S.C. section 360bbb-3(b)(1), unless the authorization is terminated or revoked.     Resp Syncytial Virus by PCR NEGATIVE NEGATIVE Final    Comment: (NOTE) Fact Sheet for Patients: bloggercourse.com  Fact Sheet for Healthcare Providers: seriousbroker.it  This test is not yet approved or cleared by the [place]  FDA and has been authorized for detection and/or diagnosis of SARS-CoV-2 by FDA under an Emergency Use Authorization (EUA). This EUA will remain in effect (meaning this test can be used) for the duration of the COVID-19 declaration under Section 564(b)(1) of the Act, 21 U.S.C. section 360bbb-3(b)(1), unless the authorization is terminated or revoked.  Performed at [facility], [address], [place], [place] [address]     Labs: CBC: Recent Labs  Lab 06/23/24 0406 06/24/24 0143  WBC 4.3 5.6  HGB 9.7* 9.6*  HCT 31.3* 29.9*  MCV 90.2 87.7  PLT 207 218   Basic Metabolic Panel: Recent Labs  Lab 06/23/24 0406 06/24/24 1915  NA 137 136  K 4.2 4.0  CL 105 98  CO2 24 26  GLUCOSE 347* 110*  BUN 27* 21  CREATININE 0.81 0.86  CALCIUM  8.1* 8.2*   Liver Function Tests: No results  for input(s): AST, ALT, ALKPHOS, BILITOT, PROT, ALBUMIN  in the last 168 hours. CBG: Recent Labs  Lab 06/28/24 0818 06/28/24 1206 06/28/24 1604 06/28/24 2135 06/29/24 0832  GLUCAP 208* 240* 204* 239* 197*    Discharge time spent: 34 minutes  Signed: Elgie Butter, MD [facility] 06/29/2024

## 2024-06-29 NOTE — TOC Transition Note (Addendum)
 Transition of Care [facility]) - Discharge Note   Patient Details  Name: Kristen Bowman MRN: [id number] Date of Birth: 15-May-1931  [facility] CM/SW Contact:  Sudie Erminio Deems, RN Phone Number: 06/29/2024, 11:07 AM   Clinical Narrative: Patient plans to transition home with [facility] for [facility]. Family was previously active with [facility] and family has cancelled services. [facility] will need to facilitate the transfer between the agencies. Hospice Liaison is working on providing oxygen in the home. ICM has verified address and will call [facility] once the oxygen has been delivered.    1426 06-29-24 ICM received notification that the oxygen has been delivered to the home. ICM has called [facility] and ETA is by 4:00. Staff RN and MD are aware. Staff RN to call daughter once [facility] arrives to transport home. No further needs identified at this time.   8381 06-29-24 ICM spoke with [facility] and patient is 4th on the list- Dispatch notified that the patient has a 6:00 meeting with [facility] in the home. Dispatch states if she is still here at 5:00 pm they will send an ambulance on over to get her home. [facility] and Staff RN aware.   Final next level of care: Home w Hospice Care Barriers to Discharge: Equipment Delay   Discharge Plan and Services Additional resources added to the After Visit Summary for   In-house Referral: NA Discharge Planning Services: CM Consult Post Acute Care Choice: Hospice, Resumption of Svcs/PTA Provider            Social Drivers of Health (SDOH) Interventions SDOH Screenings   Food Insecurity: No Food Insecurity (06/16/2024)  Housing: Low Risk  (06/16/2024)  Transportation Needs: No Transportation Needs (06/16/2024)  Utilities: Not At Risk (06/16/2024)  Social Connections: Socially Isolated (06/16/2024)  Tobacco Use: Low Risk  (06/16/2024)   Readmission Risk Interventions     No data to display

## 2024-06-29 NOTE — Progress Notes (Signed)
 Occupational Therapy Treatment Patient Details Name: Kristen Bowman MRN: [id number] DOB: 03-30-1931 Today's Date: 06/29/2024   History of present illness The pt is a 89yo female presenting 06/15/24 with AMS and lethargy. Pt + for DKA, AKI, possible UTI and acute hypoxic respiratory failure likely from aspiration.  PMH includes: dementia, anemia, DM II, osteoarthritis, HLD, CKD II HTN, IBS, and afib.   OT comments  Pt required max A to sit EOB with max multimodal cures for initiating LEs to EOB. Pt required mod A for balance/support siting OEB to wash face.hands and don clean gown. Max A x 2 attempts STS, but was unable to come to a full stand, did not attempt any pivot transfers due to safety, pt with a posterior/R lateral lean. Max A tot return to supine and total A with use of bed controls to position to [facility]. OT will continue to follow acutely to maximize level of function and safety      If plan is discharge home, recommend the following:  Two people to help with walking and/or transfers;Assistance with cooking/housework;Assistance with feeding;Direct supervision/assist for medications management;Direct supervision/assist for financial management;Assist for transportation;Help with stairs or ramp for entrance;A lot of help with bathing/dressing/bathroom   Equipment Recommendations  Hoyer lift;Hospital bed    Recommendations for Other Services      Precautions / Restrictions Precautions Precautions: Fall Recall of Precautions/Restrictions: Impaired Restrictions Weight Bearing Restrictions Per Provider Order: No       Mobility Bed Mobility Overal bed mobility: Needs Assistance Bed Mobility: Supine to Sit, Sit to Supine, Rolling Rolling: Max assist   Supine to sit: Max assist Sit to supine: Max assist   General bed mobility comments: Max multi-modal cues and extra time required to initiate moving LEs to EOB and out of bed, eleavting trunk.    Transfers Overall transfer level:  Needs assistance Equipment used: 1 person hand held assist Transfers: Sit to/from Stand Sit to Stand: Max assist   Squat pivot transfers: Max assist       General transfer comment: Heavy  max A required to power up, pt fearful. did ont attempt pivot transfers due to safety     Balance Overall balance assessment: Needs assistance Sitting-balance support: Bilateral upper extremity supported, Feet supported Sitting balance-Leahy Scale: Poor Sitting balance - Comments: mod/max A sitting EOB for balance/support, posterior leaning Postural control: Posterior lean, Right lateral lean                                 ADL either performed or assessed with clinical judgement   ADL Overall ADL's : Needs assistance/impaired     Grooming: Wash/dry hands;Wash/dry face;Sitting;Maximal assistance           Upper Body Dressing : Maximal assistance;Sitting       Toilet Transfer: Maximal assistance;Cueing for safety;Cueing for sequencing Toilet Transfer Details (indicate cue type and reason): STS, did not attempy pivot transfer due to safety Toileting- Clothing Manipulation and Hygiene: Total assistance;Bed level       Functional mobility during ADLs: Maximal assistance;Cueing for safety;Cueing for sequencing General ADL Comments: Pt with Poor sitting balance    Extremity/Trunk Assessment Upper Extremity Assessment Upper Extremity Assessment: Generalized weakness   Lower Extremity Assessment Lower Extremity Assessment: Defer to PT evaluation        Vision Ability to See in Adequate Light: 0 Adequate Patient Visual Report: No change from baseline     Perception  Praxis     Communication Communication Communication: Impaired Factors Affecting Communication: Difficulty expressing self;Reduced clarity of speech   Cognition Arousal: Alert Behavior During Therapy: WFL for tasks assessed/performed Cognition: No family/caregiver present to determine baseline                                Following commands: Impaired Following commands impaired: Follows one step commands inconsistently      Cueing   Cueing Techniques: Verbal cues, Gestural cues, Tactile cues  Exercises      Shoulder Instructions       General Comments      Pertinent Vitals/ Pain       Pain Assessment Pain Assessment: Faces Faces Pain Scale: Hurts little more Pain Location: sacral area; generalized with mobility Pain Descriptors / Indicators: Grimacing, Discomfort Pain Intervention(s): Limited activity within patient's tolerance, Monitored during session, Repositioned  Home Living                                          Prior Functioning/Environment              Frequency  Min 2X/week        Progress Toward Goals  OT Goals(current goals can now be found in the care plan section)  Progress towards OT goals: OT to reassess next treatment     Plan      Co-evaluation                 AM-PAC OT 6 Clicks Daily Activity     Outcome Measure   Help from another person eating meals?: A Little Help from another person taking care of personal grooming?: A Lot Help from another person toileting, which includes using toliet, bedpan, or urinal?: Total Help from another person bathing (including washing, rinsing, drying)?: Total Help from another person to put on and taking off regular upper body clothing?: A Lot Help from another person to put on and taking off regular lower body clothing?: Total 6 Click Score: 10    End of Session Equipment Utilized During Treatment: Gait belt;Rolling walker (2 wheels)  OT Visit Diagnosis: Muscle weakness (generalized) (M62.81);Other symptoms and signs involving cognitive function;Other abnormalities of gait and mobility (R26.89)   Activity Tolerance Patient limited by fatigue;Other (comment) (cognition)   Patient Left with call bell/phone within reach;in bed;with bed alarm  set   Nurse Communication Mobility status        Time: 9042-8985 OT Time Calculation (min): 17 min  Charges: OT General Charges $OT Visit: 1 Visit OT Treatments $Therapeutic Activity: 8-22 mins    Jacques Karna Loose 06/29/2024, 1:35 PM

## 2024-06-29 NOTE — Progress Notes (Addendum)
 Speech Language Pathology Treatment: Dysphagia  Patient Details Name: Kristen Bowman MRN: [id number] DOB: 21-Oct-1930 Today's Date: 06/29/2024 Time: 9073-9053 SLP Time Calculation (min) (ACUTE ONLY): 20 min  Assessment / Plan / Recommendation Clinical Impression  SLP observed patient with her breakfast. SLP fed patient her puree breakfast with nectar thick liquids. Patient's swallow initiation appeared timely and no overt s/s of aspiration were observed with her bites of puree. Patient was independent in drinking cup sips of nectar thick orange juice. Patient experienced immediate coughing following 3 sips of orange juice. SLP is recommending patient continue with Dys 1 (puree) diet with nectar thick liquids. Patient is discharging home today with hospice. SLP called patient's daughter and she verified that they wish to continue with puree solids and nectar thick liquids. SLP is signing off at this time.    HPI HPI: Patient is a 89 y.o. female with past medical history of diabetes mellitus type 2, hypertension, history of E. Coli UTI and associated encephalopahty, right bundle branch block, chronic anemia, chronic kidney disease stage II, anemia, dementia, depression independent and oriented at baseline come from AMS and hyperglycemia. Patient was receiving respite care at a SNF and was admitted to the hospital on 10/21 secondary to AMS hyperglycemia (daughter suspects patient was not being given her insulin ) and altered mental status and UA was positive for UTI. Also with PNA, suspected aspiration. Per RN note from 10/22, during oral care, suspected emesis in oral cavity and around dentures. Bedside swallow evaluation ordered to assess patient's swallow. Patient placed on dys 2 nectar thick diet. SLP signed off. New swallow evaluation ordered on 06/23/24 as family is requesting a change to puree diet.      SLP Plan  All goals met          Recommendations  Diet recommendations: Dysphagia 1  (puree);Nectar-thick liquid Liquids provided via: Cup;Straw Medication Administration: Crushed with puree Supervision: Staff to assist with self feeding;Full supervision/cueing for compensatory strategies Compensations: Slow rate;Small sips/bites Postural Changes and/or Swallow Maneuvers: Seated upright 90 degrees;Upright 30-60 min after meal                  Oral care BID   Frequent or constant Supervision/Assistance Dysphagia, oropharyngeal phase (R13.12)     All goals met     Damien Hy  Graduate SLP Clinican

## 2024-07-26 DEATH — deceased
